# Patient Record
Sex: Male | Born: 1935 | Race: White | Hispanic: No | Smoking: Never smoker
Health system: Southern US, Community
[De-identification: ages and names within clinical notes are randomized; demographics above are authoritative.]

## PROBLEM LIST (undated history)

## (undated) DIAGNOSIS — K5730 Diverticulosis of large intestine without perforation or abscess without bleeding: Secondary | ICD-10-CM

## (undated) DIAGNOSIS — Z8546 Personal history of malignant neoplasm of prostate: Secondary | ICD-10-CM

## (undated) DIAGNOSIS — E039 Hypothyroidism, unspecified: Secondary | ICD-10-CM

## (undated) DIAGNOSIS — N433 Hydrocele, unspecified: Secondary | ICD-10-CM

## (undated) DIAGNOSIS — I4891 Unspecified atrial fibrillation: Secondary | ICD-10-CM

## (undated) DIAGNOSIS — R7309 Other abnormal glucose: Secondary | ICD-10-CM

## (undated) DIAGNOSIS — J301 Allergic rhinitis due to pollen: Secondary | ICD-10-CM

## (undated) DIAGNOSIS — M159 Polyosteoarthritis, unspecified: Secondary | ICD-10-CM

## (undated) DIAGNOSIS — G629 Polyneuropathy, unspecified: Secondary | ICD-10-CM

## (undated) DIAGNOSIS — R634 Abnormal weight loss: Secondary | ICD-10-CM

## (undated) DIAGNOSIS — I5033 Acute on chronic diastolic (congestive) heart failure: Secondary | ICD-10-CM

## (undated) DIAGNOSIS — M1990 Unspecified osteoarthritis, unspecified site: Secondary | ICD-10-CM

## (undated) DIAGNOSIS — I10 Essential (primary) hypertension: Secondary | ICD-10-CM

## (undated) DIAGNOSIS — R269 Unspecified abnormalities of gait and mobility: Secondary | ICD-10-CM

## (undated) DIAGNOSIS — Z86010 Personal history of colonic polyps: Secondary | ICD-10-CM

## (undated) HISTORY — DX: Hydrocele, unspecified: N43.3

## (undated) HISTORY — DX: Diverticulosis of large intestine without perforation or abscess without bleeding: K57.30

## (undated) HISTORY — DX: Polyosteoarthritis, unspecified: M15.9

## (undated) HISTORY — DX: Unspecified abnormalities of gait and mobility: R26.9

## (undated) HISTORY — DX: Polyneuropathy, unspecified: G62.9

## (undated) HISTORY — DX: Acute on chronic diastolic (congestive) heart failure: I50.33

## (undated) HISTORY — DX: Hypothyroidism, unspecified: E03.9

## (undated) HISTORY — PX: OTHER SURGICAL HISTORY: SHX169

## (undated) HISTORY — DX: Unspecified osteoarthritis, unspecified site: M19.90

## (undated) HISTORY — DX: Allergic rhinitis due to pollen: J30.1

## (undated) HISTORY — DX: Essential (primary) hypertension: I10

## (undated) HISTORY — DX: Personal history of malignant neoplasm of prostate: Z85.46

## (undated) HISTORY — DX: Abnormal weight loss: R63.4

## (undated) HISTORY — DX: Other abnormal glucose: R73.09

## (undated) HISTORY — PX: PROSTATE SURGERY: SHX751

## (undated) HISTORY — PX: HYDROCELE EXCISION / REPAIR: SUR1145

## (undated) HISTORY — DX: Personal history of colonic polyps: Z86.010

## (undated) HISTORY — PX: ROTATOR CUFF REPAIR: SHX139

---

## 1998-03-25 DIAGNOSIS — E785 Hyperlipidemia, unspecified: Secondary | ICD-10-CM | POA: Insufficient documentation

## 2000-04-26 LAB — HM COLONOSCOPY

## 2000-05-12 ENCOUNTER — Encounter: Payer: Self-pay | Admitting: Internal Medicine

## 2003-07-27 ENCOUNTER — Ambulatory Visit (HOSPITAL_COMMUNITY): Admission: RE | Admit: 2003-07-27 | Discharge: 2003-07-27 | Payer: Self-pay | Admitting: Neurology

## 2003-11-09 ENCOUNTER — Encounter: Admission: RE | Admit: 2003-11-09 | Discharge: 2003-11-09 | Payer: Self-pay | Admitting: Internal Medicine

## 2003-11-23 ENCOUNTER — Inpatient Hospital Stay (HOSPITAL_BASED_OUTPATIENT_CLINIC_OR_DEPARTMENT_OTHER): Admission: RE | Admit: 2003-11-23 | Discharge: 2003-11-23 | Payer: Self-pay | Admitting: *Deleted

## 2003-11-29 ENCOUNTER — Encounter: Admission: RE | Admit: 2003-11-29 | Discharge: 2003-11-29 | Payer: Self-pay | Admitting: *Deleted

## 2003-12-26 ENCOUNTER — Encounter: Payer: Self-pay | Admitting: Cardiology

## 2003-12-26 ENCOUNTER — Ambulatory Visit (HOSPITAL_COMMUNITY): Admission: RE | Admit: 2003-12-26 | Discharge: 2003-12-26 | Payer: Self-pay | Admitting: Cardiology

## 2004-01-30 ENCOUNTER — Encounter: Payer: Self-pay | Admitting: Internal Medicine

## 2004-05-02 ENCOUNTER — Ambulatory Visit: Payer: Self-pay | Admitting: Internal Medicine

## 2004-10-29 ENCOUNTER — Ambulatory Visit: Payer: Self-pay | Admitting: Internal Medicine

## 2004-11-19 ENCOUNTER — Ambulatory Visit: Payer: Self-pay | Admitting: Internal Medicine

## 2004-11-23 ENCOUNTER — Ambulatory Visit: Payer: Self-pay | Admitting: Cardiology

## 2004-12-05 ENCOUNTER — Ambulatory Visit: Payer: Self-pay | Admitting: Cardiology

## 2004-12-28 ENCOUNTER — Ambulatory Visit: Payer: Self-pay | Admitting: Internal Medicine

## 2005-02-20 ENCOUNTER — Ambulatory Visit: Payer: Self-pay | Admitting: Internal Medicine

## 2005-04-05 ENCOUNTER — Ambulatory Visit: Payer: Self-pay | Admitting: Internal Medicine

## 2005-04-15 ENCOUNTER — Ambulatory Visit: Payer: Self-pay | Admitting: Gastroenterology

## 2005-04-26 ENCOUNTER — Ambulatory Visit: Payer: Self-pay | Admitting: Gastroenterology

## 2005-04-26 ENCOUNTER — Encounter: Payer: Self-pay | Admitting: Internal Medicine

## 2005-07-08 ENCOUNTER — Ambulatory Visit: Payer: Self-pay | Admitting: Internal Medicine

## 2005-10-31 ENCOUNTER — Ambulatory Visit: Payer: Self-pay | Admitting: Internal Medicine

## 2005-11-11 ENCOUNTER — Ambulatory Visit: Payer: Self-pay | Admitting: Internal Medicine

## 2006-01-21 ENCOUNTER — Ambulatory Visit: Payer: Self-pay | Admitting: Internal Medicine

## 2006-10-28 DIAGNOSIS — Z860100 Personal history of colon polyps, unspecified: Secondary | ICD-10-CM

## 2006-10-28 DIAGNOSIS — I10 Essential (primary) hypertension: Secondary | ICD-10-CM | POA: Insufficient documentation

## 2006-10-28 DIAGNOSIS — E785 Hyperlipidemia, unspecified: Secondary | ICD-10-CM

## 2006-10-28 HISTORY — DX: Hyperlipidemia, unspecified: E78.5

## 2006-10-28 HISTORY — DX: Essential (primary) hypertension: I10

## 2006-10-28 HISTORY — DX: Personal history of colonic polyps: Z86.010

## 2006-10-28 HISTORY — DX: Personal history of colon polyps, unspecified: Z86.0100

## 2006-11-18 ENCOUNTER — Ambulatory Visit: Payer: Self-pay | Admitting: Internal Medicine

## 2006-11-18 LAB — CONVERTED CEMR LAB
AST: 26 units/L (ref 0–37)
Albumin: 3.2 g/dL — ABNORMAL LOW (ref 3.5–5.2)
Basophils Absolute: 0 10*3/uL (ref 0.0–0.1)
Bilirubin Urine: NEGATIVE
Bilirubin, Direct: 0.1 mg/dL (ref 0.0–0.3)
Blood in Urine, dipstick: NEGATIVE
Chloride: 107 meq/L (ref 96–112)
Cholesterol: 143 mg/dL (ref 0–200)
Eosinophils Absolute: 0.1 10*3/uL (ref 0.0–0.6)
Eosinophils Relative: 2.1 % (ref 0.0–5.0)
GFR calc Af Amer: 106 mL/min
GFR calc non Af Amer: 88 mL/min
Glucose, Bld: 101 mg/dL — ABNORMAL HIGH (ref 70–99)
Glucose, Urine, Semiquant: NEGATIVE
HCT: 39.5 % (ref 39.0–52.0)
Ketones, urine, test strip: NEGATIVE
Lymphocytes Relative: 41.2 % (ref 12.0–46.0)
MCHC: 33.7 g/dL (ref 30.0–36.0)
MCV: 89.4 fL (ref 78.0–100.0)
Monocytes Absolute: 0.4 10*3/uL (ref 0.2–0.7)
Neutro Abs: 2.3 10*3/uL (ref 1.4–7.7)
Neutrophils Relative %: 48.7 % (ref 43.0–77.0)
Nitrite: NEGATIVE
PSA: 0.96 ng/mL (ref 0.10–4.00)
Potassium: 4 meq/L (ref 3.5–5.1)
Protein, U semiquant: NEGATIVE
RBC: 4.42 M/uL (ref 4.22–5.81)
Sodium: 141 meq/L (ref 135–145)
Specific Gravity, Urine: 1.015
TSH: 2.91 microintl units/mL (ref 0.35–5.50)
Total CHOL/HDL Ratio: 3.5
Urobilinogen, UA: 0.2
WBC Urine, dipstick: NEGATIVE
WBC: 4.7 10*3/uL (ref 4.5–10.5)
pH: 6

## 2006-11-27 ENCOUNTER — Telehealth: Payer: Self-pay | Admitting: *Deleted

## 2006-11-27 ENCOUNTER — Ambulatory Visit: Payer: Self-pay | Admitting: Internal Medicine

## 2006-11-27 DIAGNOSIS — K5730 Diverticulosis of large intestine without perforation or abscess without bleeding: Secondary | ICD-10-CM

## 2006-11-27 DIAGNOSIS — R634 Abnormal weight loss: Secondary | ICD-10-CM

## 2006-11-27 DIAGNOSIS — E039 Hypothyroidism, unspecified: Secondary | ICD-10-CM

## 2006-11-27 DIAGNOSIS — M1990 Unspecified osteoarthritis, unspecified site: Secondary | ICD-10-CM | POA: Insufficient documentation

## 2006-11-27 HISTORY — DX: Hypothyroidism, unspecified: E03.9

## 2006-11-27 HISTORY — DX: Diverticulosis of large intestine without perforation or abscess without bleeding: K57.30

## 2006-11-27 HISTORY — DX: Abnormal weight loss: R63.4

## 2006-11-27 HISTORY — DX: Unspecified osteoarthritis, unspecified site: M19.90

## 2006-12-30 ENCOUNTER — Encounter: Payer: Self-pay | Admitting: Internal Medicine

## 2007-01-28 ENCOUNTER — Ambulatory Visit: Payer: Self-pay | Admitting: Internal Medicine

## 2007-02-25 ENCOUNTER — Ambulatory Visit: Payer: Self-pay | Admitting: Internal Medicine

## 2007-02-27 ENCOUNTER — Encounter (INDEPENDENT_AMBULATORY_CARE_PROVIDER_SITE_OTHER): Payer: Self-pay | Admitting: Urology

## 2007-02-27 ENCOUNTER — Ambulatory Visit (HOSPITAL_COMMUNITY): Admission: RE | Admit: 2007-02-27 | Discharge: 2007-02-28 | Payer: Self-pay | Admitting: Urology

## 2007-04-03 ENCOUNTER — Ambulatory Visit: Admission: RE | Admit: 2007-04-03 | Discharge: 2007-06-25 | Payer: Self-pay | Admitting: Radiation Oncology

## 2007-04-06 ENCOUNTER — Encounter: Payer: Self-pay | Admitting: Internal Medicine

## 2007-04-06 ENCOUNTER — Telehealth (INDEPENDENT_AMBULATORY_CARE_PROVIDER_SITE_OTHER): Payer: Self-pay | Admitting: *Deleted

## 2007-05-28 ENCOUNTER — Ambulatory Visit: Payer: Self-pay | Admitting: Internal Medicine

## 2007-05-28 ENCOUNTER — Telehealth: Payer: Self-pay | Admitting: Internal Medicine

## 2007-05-28 DIAGNOSIS — Z8546 Personal history of malignant neoplasm of prostate: Secondary | ICD-10-CM | POA: Insufficient documentation

## 2007-05-28 HISTORY — DX: Personal history of malignant neoplasm of prostate: Z85.46

## 2007-05-28 LAB — CONVERTED CEMR LAB
Cholesterol, target level: 200 mg/dL
HDL goal, serum: 40 mg/dL

## 2007-06-25 ENCOUNTER — Ambulatory Visit: Admission: RE | Admit: 2007-06-25 | Discharge: 2007-08-09 | Payer: Self-pay | Admitting: Radiation Oncology

## 2007-07-15 ENCOUNTER — Encounter: Payer: Self-pay | Admitting: Internal Medicine

## 2007-07-20 ENCOUNTER — Encounter: Payer: Self-pay | Admitting: Internal Medicine

## 2007-08-05 ENCOUNTER — Telehealth: Payer: Self-pay | Admitting: *Deleted

## 2007-09-08 ENCOUNTER — Encounter: Payer: Self-pay | Admitting: Internal Medicine

## 2007-09-08 ENCOUNTER — Telehealth: Payer: Self-pay | Admitting: *Deleted

## 2007-10-08 ENCOUNTER — Ambulatory Visit: Payer: Self-pay | Admitting: Internal Medicine

## 2007-10-08 LAB — CONVERTED CEMR LAB
Basophils Relative: 0.8 % (ref 0.0–3.0)
Eosinophils Relative: 1.6 % (ref 0.0–5.0)
Monocytes Relative: 9 % (ref 3.0–12.0)
Neutrophils Relative %: 60.3 % (ref 43.0–77.0)
Platelets: 161 10*3/uL (ref 150–400)
RBC: 4.18 M/uL — ABNORMAL LOW (ref 4.22–5.81)
WBC: 3.9 10*3/uL — ABNORMAL LOW (ref 4.5–10.5)

## 2007-10-15 ENCOUNTER — Ambulatory Visit: Payer: Self-pay | Admitting: Internal Medicine

## 2007-10-27 ENCOUNTER — Encounter: Payer: Self-pay | Admitting: Internal Medicine

## 2007-12-02 ENCOUNTER — Ambulatory Visit: Payer: Self-pay | Admitting: Internal Medicine

## 2007-12-02 LAB — CONVERTED CEMR LAB
AST: 28 units/L (ref 0–37)
Basophils Absolute: 0 10*3/uL (ref 0.0–0.1)
Basophils Relative: 0.5 % (ref 0.0–3.0)
Bilirubin Urine: NEGATIVE
Blood in Urine, dipstick: NEGATIVE
Chloride: 112 meq/L (ref 96–112)
Cholesterol: 142 mg/dL (ref 0–200)
Creatinine, Ser: 1 mg/dL (ref 0.4–1.5)
Eosinophils Absolute: 0.1 10*3/uL (ref 0.0–0.7)
Ferritin: 17.1 ng/mL — ABNORMAL LOW (ref 22.0–322.0)
GFR calc non Af Amer: 78 mL/min
Glucose, Urine, Semiquant: NEGATIVE
HDL: 34.2 mg/dL — ABNORMAL LOW (ref >39.0)
Ketones, urine, test strip: NEGATIVE
MCHC: 34.4 g/dL (ref 30.0–36.0)
MCV: 91.7 fL (ref 78.0–100.0)
Neutrophils Relative %: 55.6 % (ref 43.0–77.0)
Platelets: 159 10*3/uL (ref 150–400)
Protein, U semiquant: NEGATIVE
RBC: 4.26 M/uL (ref 4.22–5.81)
RDW: 14.8 % — ABNORMAL HIGH (ref 11.5–14.6)
Sodium: 141 meq/L (ref 135–145)
TSH: 1.84 microintl units/mL (ref 0.35–5.50)
Total Bilirubin: 0.8 mg/dL (ref 0.3–1.2)
Triglycerides: 44 mg/dL (ref 0–149)
Urobilinogen, UA: 0.2
VLDL: 9 mg/dL (ref 0–40)
Vitamin B-12: 525 pg/mL (ref 211–911)
pH: 6

## 2007-12-10 ENCOUNTER — Ambulatory Visit: Payer: Self-pay | Admitting: Internal Medicine

## 2007-12-17 ENCOUNTER — Telehealth: Payer: Self-pay | Admitting: Internal Medicine

## 2008-01-20 ENCOUNTER — Encounter: Payer: Self-pay | Admitting: Internal Medicine

## 2008-04-05 ENCOUNTER — Ambulatory Visit: Payer: Self-pay | Admitting: Internal Medicine

## 2008-04-05 ENCOUNTER — Telehealth: Payer: Self-pay | Admitting: Internal Medicine

## 2008-04-05 DIAGNOSIS — K1120 Sialoadenitis, unspecified: Secondary | ICD-10-CM | POA: Insufficient documentation

## 2008-04-13 ENCOUNTER — Telehealth: Payer: Self-pay | Admitting: Internal Medicine

## 2008-06-07 ENCOUNTER — Ambulatory Visit: Payer: Self-pay | Admitting: Internal Medicine

## 2008-06-07 DIAGNOSIS — J301 Allergic rhinitis due to pollen: Secondary | ICD-10-CM | POA: Insufficient documentation

## 2008-06-07 HISTORY — DX: Allergic rhinitis due to pollen: J30.1

## 2008-06-07 LAB — CONVERTED CEMR LAB
Calcium: 9.4 mg/dL (ref 8.4–10.5)
Free T4: 1.1 ng/dL (ref 0.6–1.6)
GFR calc non Af Amer: 77.48 mL/min (ref >60)
Sodium: 142 meq/L (ref 135–145)
T3, Free: 2.9 pg/mL (ref 2.3–4.2)

## 2008-08-24 ENCOUNTER — Ambulatory Visit: Payer: Self-pay | Admitting: Internal Medicine

## 2008-08-24 DIAGNOSIS — J320 Chronic maxillary sinusitis: Secondary | ICD-10-CM | POA: Insufficient documentation

## 2008-08-24 HISTORY — DX: Chronic maxillary sinusitis: J32.0

## 2008-09-08 ENCOUNTER — Encounter: Payer: Self-pay | Admitting: Internal Medicine

## 2008-10-18 ENCOUNTER — Ambulatory Visit: Payer: Self-pay | Admitting: Internal Medicine

## 2008-10-18 DIAGNOSIS — R7309 Other abnormal glucose: Secondary | ICD-10-CM

## 2008-10-18 DIAGNOSIS — R498 Other voice and resonance disorders: Secondary | ICD-10-CM | POA: Insufficient documentation

## 2008-10-18 HISTORY — DX: Other abnormal glucose: R73.09

## 2008-10-18 HISTORY — DX: Other voice and resonance disorders: R49.8

## 2008-10-18 LAB — CONVERTED CEMR LAB
Calcium: 9.2 mg/dL (ref 8.4–10.5)
Creatinine, Ser: 0.9 mg/dL (ref 0.4–1.5)
Hgb A1c MFr Bld: 6 % (ref 4.6–6.5)

## 2008-11-22 ENCOUNTER — Ambulatory Visit: Payer: Self-pay | Admitting: Internal Medicine

## 2008-11-22 LAB — CONVERTED CEMR LAB: TSH: 1.05 microintl units/mL (ref 0.35–5.50)

## 2008-11-24 ENCOUNTER — Encounter: Payer: Self-pay | Admitting: Internal Medicine

## 2009-02-08 ENCOUNTER — Ambulatory Visit: Payer: Self-pay | Admitting: Internal Medicine

## 2009-02-08 LAB — CONVERTED CEMR LAB
Albumin: 3.5 g/dL (ref 3.5–5.2)
Alkaline Phosphatase: 45 units/L (ref 39–117)
BUN: 11 mg/dL (ref 6–23)
Bilirubin, Direct: 0.1 mg/dL (ref 0.0–0.3)
CO2: 30 meq/L (ref 19–32)
Chloride: 108 meq/L (ref 96–112)
Creatinine, Ser: 1 mg/dL (ref 0.4–1.5)
LDL Cholesterol: 97 mg/dL (ref 0–99)
Total CHOL/HDL Ratio: 4

## 2009-02-21 ENCOUNTER — Ambulatory Visit: Payer: Self-pay | Admitting: Internal Medicine

## 2009-03-06 ENCOUNTER — Telehealth: Payer: Self-pay | Admitting: Internal Medicine

## 2009-04-19 ENCOUNTER — Telehealth: Payer: Self-pay | Admitting: Internal Medicine

## 2009-06-06 ENCOUNTER — Ambulatory Visit: Payer: Self-pay | Admitting: Internal Medicine

## 2009-06-06 DIAGNOSIS — B351 Tinea unguium: Secondary | ICD-10-CM

## 2009-06-06 HISTORY — DX: Tinea unguium: B35.1

## 2009-06-06 LAB — CONVERTED CEMR LAB
ALT: 21 units/L (ref 0–53)
Albumin: 3.6 g/dL (ref 3.5–5.2)
BUN: 12 mg/dL (ref 6–23)
Basophils Relative: 0.6 % (ref 0.0–3.0)
CO2: 30 meq/L (ref 19–32)
Chloride: 105 meq/L (ref 96–112)
Eosinophils Relative: 1.4 % (ref 0.0–5.0)
Glucose, Bld: 106 mg/dL — ABNORMAL HIGH (ref 70–99)
HDL: 40.7 mg/dL (ref >39.00)
Lymphocytes Relative: 29.6 % (ref 12.0–46.0)
MCV: 96 fL (ref 78.0–100.0)
Monocytes Absolute: 0.5 10*3/uL (ref 0.1–1.0)
Monocytes Relative: 9 % (ref 3.0–12.0)
Neutrophils Relative %: 59.4 % (ref 43.0–77.0)
Potassium: 3.9 meq/L (ref 3.5–5.1)
RBC: 4.71 M/uL (ref 4.22–5.81)
Sodium: 140 meq/L (ref 135–145)
Total Bilirubin: 1 mg/dL (ref 0.3–1.2)
Triglycerides: 73 mg/dL (ref 0.0–149.0)
VLDL: 14.6 mg/dL (ref 0.0–40.0)
WBC: 5.3 10*3/uL (ref 4.5–10.5)

## 2009-09-07 ENCOUNTER — Ambulatory Visit: Payer: Self-pay | Admitting: Internal Medicine

## 2009-12-26 ENCOUNTER — Ambulatory Visit: Payer: Self-pay | Admitting: Internal Medicine

## 2009-12-26 LAB — CONVERTED CEMR LAB
Albumin: 3.8 g/dL (ref 3.5–5.2)
Cholesterol: 148 mg/dL (ref 0–200)
HDL: 38.3 mg/dL — ABNORMAL LOW (ref >39.00)
LDL Cholesterol: 98 mg/dL (ref 0–99)
Total CHOL/HDL Ratio: 4
Total Protein: 6.4 g/dL (ref 6.0–8.3)
Triglycerides: 60 mg/dL (ref 0.0–149.0)

## 2010-01-03 ENCOUNTER — Ambulatory Visit: Payer: Self-pay | Admitting: Internal Medicine

## 2010-01-03 DIAGNOSIS — M159 Polyosteoarthritis, unspecified: Secondary | ICD-10-CM | POA: Insufficient documentation

## 2010-01-03 HISTORY — DX: Polyosteoarthritis, unspecified: M15.9

## 2010-01-17 ENCOUNTER — Ambulatory Visit: Payer: Self-pay | Admitting: Family Medicine

## 2010-01-17 DIAGNOSIS — T07XXXA Unspecified multiple injuries, initial encounter: Secondary | ICD-10-CM | POA: Insufficient documentation

## 2010-04-14 ENCOUNTER — Encounter: Payer: Self-pay | Admitting: Surgery

## 2010-04-15 ENCOUNTER — Encounter: Payer: Self-pay | Admitting: *Deleted

## 2010-04-18 ENCOUNTER — Encounter: Payer: Self-pay | Admitting: Gastroenterology

## 2010-04-21 ENCOUNTER — Encounter: Payer: Self-pay | Admitting: *Deleted

## 2010-04-22 LAB — CONVERTED CEMR LAB
BUN: 20 mg/dL (ref 6–23)
Basophils Absolute: 0 10*3/uL (ref 0.0–0.1)
Basophils Relative: 0 % (ref 0.0–1.0)
CO2: 30 meq/L (ref 19–32)
Chloride: 105 meq/L (ref 96–112)
Creatinine, Ser: 1 mg/dL (ref 0.4–1.5)
HCT: 39.4 % (ref 39.0–52.0)
Hemoglobin: 13.4 g/dL (ref 13.0–17.0)
MCHC: 34 g/dL (ref 30.0–36.0)
Monocytes Absolute: 0.5 10*3/uL (ref 0.2–0.7)
Neutrophils Relative %: 49.4 % (ref 43.0–77.0)
RBC: 4.28 M/uL (ref 4.22–5.81)
RDW: 13.3 % (ref 11.5–14.6)
Sodium: 140 meq/L (ref 135–145)
TSH: 1.89 microintl units/mL (ref 0.35–5.50)
WBC: 4.9 10*3/uL (ref 4.5–10.5)

## 2010-04-24 NOTE — Assessment & Plan Note (Signed)
Summary: 3 month rov/njr   Vital Signs:  Patient profile:   77 year old male Height:      75 inches Weight:      193 pounds BMI:     24.21 Temp:     98.2 degrees F oral Pulse rate:   48 / minute Resp:     14 per minute BP sitting:   130 / 80  (left arm)  Vitals Entered By: Willy Eddy, LPN (September 07, 2009 9:33 AM) CC: roa-has taken lamisil for 90 days, Hypertension Management, Lipid Management   Primary Care Provider:  Stacie Glaze MD  CC:  roa-has taken lamisil for 90 days, Hypertension Management, and Lipid Management.  History of Present Illness: the pt had seeds and xrt for prostate cnacer and psa has not increased the pt has screening labs for cbc bmet and psa last ov which we reviewed and set expectation blood pressure  and pulse is stable and the pulse is slow ( bradycardic)  Hypertension History:      He denies headache, chest pain, palpitations, dyspnea with exertion, orthopnea, PND, peripheral edema, visual symptoms, neurologic problems, syncope, and side effects from treatment.  stable.        Positive major cardiovascular risk factors include male age 78 years old or older, hyperlipidemia, and hypertension.  Negative major cardiovascular risk factors include negative family history for ischemic heart disease and non-tobacco-user status.    Lipid Management History:      Positive NCEP/ATP III risk factors include male age 20 years old or older and hypertension.  Negative NCEP/ATP III risk factors include no family history for ischemic heart disease and non-tobacco-user status.      Preventive Screening-Counseling & Management  Alcohol-Tobacco     Smoking Status: never     Passive Smoke Exposure: no  Problems Prior to Update: 1)  Onychomycosis  (ICD-110.1) 2)  Hoarseness, Chronic  (ICD-784.49) 3)  Hyperglycemia  (ICD-790.29) 4)  Chronic Maxillary Sinusitis  (ICD-473.0) 5)  Allergic Rhinitis Due To Pollen  (ICD-477.0) 6)  Sialadenitis, Right   (ICD-527.2) 7)  Preoperative Examination  (ICD-V72.84) 8)  Weight Loss, Recent  (ICD-783.21) 9)  Preventive Health Care  (ICD-V70.0) 10)  Neoplasm, Malignant, Prostate, Hx Of, S/p Turp  (ICD-V10.46) 11)  Osteoarthritis  (ICD-715.90) 12)  Hypothyroidism  (ICD-244.9) 13)  Diverticulosis, Colon  (ICD-562.10) 14)  Hypertension  (ICD-401.9) 15)  Hyperlipidemia  (ICD-272.4) 16)  Colonic Polyps, Hx of  (ICD-V12.72)  Current Problems (verified): 1)  Onychomycosis  (ICD-110.1) 2)  Hoarseness, Chronic  (ICD-784.49) 3)  Hyperglycemia  (ICD-790.29) 4)  Chronic Maxillary Sinusitis  (ICD-473.0) 5)  Allergic Rhinitis Due To Pollen  (ICD-477.0) 6)  Sialadenitis, Right  (ICD-527.2) 7)  Preoperative Examination  (ICD-V72.84) 8)  Weight Loss, Recent  (ICD-783.21) 9)  Preventive Health Care  (ICD-V70.0) 10)  Neoplasm, Malignant, Prostate, Hx Of, S/p Turp  (ICD-V10.46) 11)  Osteoarthritis  (ICD-715.90) 12)  Hypothyroidism  (ICD-244.9) 13)  Diverticulosis, Colon  (ICD-562.10) 14)  Hypertension  (ICD-401.9) 15)  Hyperlipidemia  (ICD-272.4) 16)  Colonic Polyps, Hx of  (ICD-V12.72)  Medications Prior to Update: 1)  Prinivil 20 Mg  Tabs (Lisinopril) .... Take 1 Tablet By Mouth Once A Day 2)  Crestor 10 Mg Tabs (Rosuvastatin Calcium) .Marland Kitchen.. 1 Once Daily 3)  Synthroid 50 Mcg  Tabs (Levothyroxine Sodium) .... Take 1 Tablet By Mouth Once A Day 4)  Bl Vitamin B-6 100 Mg  Tabs (Pyridoxine Hcl) .... Once Daily 5)  Amlodipine Besylate  5 Mg  Tabs (Amlodipine Besylate) .... Once Daily 6)  Saw Palmetto 160 Mg  Caps (Saw Palmetto (Serenoa Repens)) .... Once Daily 7)  Travatan 0.004 %  Soln (Travoprost) .... As Directed 8)  Multivitamins   Caps (Multiple Vitamin) .Marland Kitchen.. 1 Once Daily 9)  Zantac 150 Mg  Caps (Ranitidine Hcl) .Marland Kitchen.. 1 Once Daily 10)  Adult Aspirin Ec Low Strength 81 Mg Tbec (Aspirin) .Marland Kitchen.. 1 Once Daily 11)  Glucosamine-Chondroitin-Msm 500-250-250 Mg Caps (Glucosamine-Chondroitin-Msm) .... 2 Once Daily 12)   Timoptic 0.5 % Soln (Timolol Maleate) .Marland Kitchen.. 1 Drop in Both Eyes Two Times A Day 13)  Fish Oil Concentrate 1000 Mg Caps (Omega-3 Fatty Acids) .... Two  Two Times A Day 14)  Ipratropium Bromide 0.03 % Soln (Ipratropium Bromide) .... Two Sprays in Each Nostril Two Times A Day  Current Medications (verified): 1)  Prinivil 20 Mg  Tabs (Lisinopril) .... Take 1 Tablet By Mouth Once A Day 2)  Crestor 10 Mg Tabs (Rosuvastatin Calcium) .Marland Kitchen.. 1 Once Daily 3)  Synthroid 50 Mcg  Tabs (Levothyroxine Sodium) .... Take 1 Tablet By Mouth Once A Day 4)  Bl Vitamin B-6 100 Mg  Tabs (Pyridoxine Hcl) .... Once Daily 5)  Amlodipine Besylate 5 Mg  Tabs (Amlodipine Besylate) .... Once Daily 6)  Saw Palmetto 160 Mg  Caps (Saw Palmetto (Serenoa Repens)) .... Once Daily 7)  Travatan 0.004 %  Soln (Travoprost) .... As Directed 8)  Multivitamins   Caps (Multiple Vitamin) .Marland Kitchen.. 1 Once Daily 9)  Zantac 150 Mg  Caps (Ranitidine Hcl) .Marland Kitchen.. 1 Once Daily 10)  Adult Aspirin Ec Low Strength 81 Mg Tbec (Aspirin) .Marland Kitchen.. 1 Once Daily 11)  Glucosamine-Chondroitin-Msm 500-250-250 Mg Caps (Glucosamine-Chondroitin-Msm) .... 2 Two Times A Day 12)  Timoptic 0.5 % Soln (Timolol Maleate) .Marland Kitchen.. 1 Drop in Both Eyes Two Times A Day 13)  Fish Oil Concentrate 1000 Mg Caps (Omega-3 Fatty Acids) .... Two  Two Times A Day 14)  Ipratropium Bromide 0.03 % Soln (Ipratropium Bromide) .... Two Sprays in Each Nostril Two Times A Day  Allergies (verified): No Known Drug Allergies  Past History:  Family History: Last updated: 03-27-2007 mother died at 74  CAD  ( advanced age) father  died at 42 with pancreatic CA  Social History: Last updated: 11/27/2006 Retired Married Never Smoked Alcohol use-no Drug use-no Regular exercise-yes  Risk Factors: Caffeine Use: 0 (2007-03-27) Exercise: yes (11/27/2006)  Risk Factors: Smoking Status: never (09/07/2009) Passive Smoke Exposure: no (09/07/2009)  Past medical, surgical, family and social histories  (including risk factors) reviewed, and no changes noted (except as noted below).  Past Medical History: Reviewed history from 11/27/2006 and no changes required. Colonic polyps, hx of Hyperlipidemia Hypertension BET Diverticulosis, colon Hypothyroidism Osteoarthritis Benign prostatic hypertrophy  Past Surgical History: Reviewed history from 11/27/2006 and no changes required. Colonoscopy-1998 Rotator cuff repair arthroscopy to right knee hydrocele  Family History: Reviewed history from 2007-03-27 and no changes required. mother died at 24  CAD  ( advanced age) father  died at 41 with pancreatic CA  Social History: Reviewed history from 11/27/2006 and no changes required. Retired Married Never Smoked Alcohol use-no Drug use-no Regular exercise-yes  Review of Systems  The patient denies anorexia, fever, weight loss, weight gain, vision loss, decreased hearing, hoarseness, chest pain, syncope, dyspnea on exertion, peripheral edema, prolonged cough, headaches, hemoptysis, abdominal pain, melena, hematochezia, severe indigestion/heartburn, hematuria, incontinence, genital sores, muscle weakness, suspicious skin lesions, transient blindness, difficulty walking, depression, unusual weight change, abnormal bleeding,  enlarged lymph nodes, angioedema, breast masses, and testicular masses.    Physical Exam  General:  Well-developed,well-nourished,in no acute distress; alert,appropriate and cooperative throughout examination Head:  normocephalic, atraumatic, and male-pattern balding.   Eyes:  pupils equal.   Ears:  R ear normal and L ear normal.   Nose:  no external deformity and no nasal discharge.   Mouth:  good dentition and pharynx pink and moist.   Neck:  No deformities, masses, or tenderness noted. Lungs:  Normal respiratory effort, chest expands symmetrically. Lungs are clear to auscultation, no crackles or wheezes. Heart:  Normal rate and regular rhythm. S1 and S2 normal  without gallop, murmur, click, rub or other extra sounds. Abdomen:  Bowel sounds positive,abdomen soft and non-tender without masses, organomegaly or hernias noted. Msk:  normal ROM and no joint tenderness.   Extremities:  thicken nails with trimming and care given today I have spent greater that 30 min face to face evaluating this patient  Neurologic:  alert & oriented X3 and cranial nerves II-XII intact.     Complete Medication List: 1)  Prinivil 20 Mg Tabs (Lisinopril) .... Take 1 tablet by mouth once a day 2)  Crestor 10 Mg Tabs (Rosuvastatin calcium) .Marland Kitchen.. 1 once daily 3)  Synthroid 50 Mcg Tabs (Levothyroxine sodium) .... Take 1 tablet by mouth once a day 4)  Bl Vitamin B-6 100 Mg Tabs (Pyridoxine hcl) .... Once daily 5)  Amlodipine Besylate 5 Mg Tabs (Amlodipine besylate) .... Once daily 6)  Saw Palmetto 160 Mg Caps (Saw palmetto (serenoa repens)) .... Once daily 7)  Travatan 0.004 % Soln (Travoprost) .... As directed 8)  Multivitamins Caps (Multiple vitamin) .Marland Kitchen.. 1 once daily 9)  Zantac 150 Mg Caps (Ranitidine hcl) .Marland Kitchen.. 1 once daily 10)  Adult Aspirin Ec Low Strength 81 Mg Tbec (Aspirin) .Marland Kitchen.. 1 once daily 11)  Glucosamine-chondroitin-msm 500-250-250 Mg Caps (Glucosamine-chondroitin-msm) .... 2 two times a day 12)  Timoptic 0.5 % Soln (Timolol maleate) .Marland Kitchen.. 1 drop in both eyes two times a day 13)  Fish Oil Concentrate 1000 Mg Caps (Omega-3 fatty acids) .... Two  two times a day 14)  Ipratropium Bromide 0.03 % Soln (Ipratropium bromide) .... Two sprays in each nostril two times a day  Hypertension Assessment/Plan:      The patient's hypertensive risk group is category B: At least one risk factor (excluding diabetes) with no target organ damage.  His calculated 10 year risk of coronary heart disease is 14 %.  Today's blood pressure is 130/80.  His blood pressure goal is < 140/90.  Lipid Assessment/Plan:      Based on NCEP/ATP III, the patient's risk factor category is "2 or more risk  factors and a calculated 10 year CAD risk of < 20%".  The patient's lipid goals are as follows: Total cholesterol goal is 200; LDL cholesterol goal is 130; HDL cholesterol goal is 40; Triglyceride goal is 150.  His LDL cholesterol goal has been met.    Patient Instructions: 1)  Please schedule a follow-up appointment in 4 months. 2)  Hepatic Panel prior to visit, ICD-9:995.20 3)  Lipid Panel prior to visit, ICD-9:272.4

## 2010-04-24 NOTE — Assessment & Plan Note (Signed)
Summary: 4 MONTH ROV/NJR   Vital Signs:  Patient profile:   77 year old male Height:      75 inches Weight:      195 pounds BMI:     24.46 Temp:     98.2 degrees F oral Pulse rate:   48 / minute Resp:     14 per minute BP sitting:   124 / 78  (left arm)  Vitals Entered By: Willy Eddy, LPN (January 03, 2010 9:18 AM) CC: roa labs, Lipid Management Is Patient Diabetic? No   Primary Care Provider:  Stacie Glaze MD  CC:  roa labs and Lipid Management.  History of Present Illness:  on crestor one daily with good stable results and normal LFT blood presure is well controlled pt has acute/chronic complaints of "lack of energy"  and OA in shoulder and knee Hx of rotator cuff repair 15 years ago He has "dull joint" pain has been using artharest He has a hx of arthroscopy in the right knee ( [place])    Lipid Management History:      Positive NCEP/ATP III risk factors include male age 32 years old or older, HDL cholesterol less than 40, and hypertension.  Negative NCEP/ATP III risk factors include no family history for ischemic heart disease, non-tobacco-user status, no ASHD (atherosclerotic heart disease), no prior stroke/TIA, and no history of aortic aneurysm.     Preventive Screening-Counseling & Management  Alcohol-Tobacco     Smoking Status: never     Passive Smoke Exposure: no     Tobacco Counseling: not indicated; no tobacco use  Problems Prior to Update: 1)  Onychomycosis  (ICD-110.1) 2)  Hoarseness, Chronic  (ICD-784.49) 3)  Hyperglycemia  (ICD-790.29) 4)  Chronic Maxillary Sinusitis  (ICD-473.0) 5)  Allergic Rhinitis Due To Pollen  (ICD-477.0) 6)  Sialadenitis, Right  (ICD-527.2) 7)  Preoperative Examination  (ICD-V72.84) 8)  Weight Loss, Recent  (ICD-783.21) 9)  Preventive Health Care  (ICD-V70.0) 10)  Neoplasm, Malignant, Prostate, Hx Of, S/p Turp  (ICD-V10.46) 11)  Osteoarthritis  (ICD-715.90) 12)  Hypothyroidism  (ICD-244.9) 13)   Diverticulosis, Colon  (ICD-562.10) 14)  Hypertension  (ICD-401.9) 15)  Hyperlipidemia  (ICD-272.4) 16)  Colonic Polyps, Hx of  (ICD-V12.72)  Current Problems (verified): 1)  Onychomycosis  (ICD-110.1) 2)  Hoarseness, Chronic  (ICD-784.49) 3)  Hyperglycemia  (ICD-790.29) 4)  Chronic Maxillary Sinusitis  (ICD-473.0) 5)  Allergic Rhinitis Due To Pollen  (ICD-477.0) 6)  Sialadenitis, Right  (ICD-527.2) 7)  Preoperative Examination  (ICD-V72.84) 8)  Weight Loss, Recent  (ICD-783.21) 9)  Preventive Health Care  (ICD-V70.0) 10)  Neoplasm, Malignant, Prostate, Hx Of, S/p Turp  (ICD-V10.46) 11)  Osteoarthritis  (ICD-715.90) 12)  Hypothyroidism  (ICD-244.9) 13)  Diverticulosis, Colon  (ICD-562.10) 14)  Hypertension  (ICD-401.9) 15)  Hyperlipidemia  (ICD-272.4) 16)  Colonic Polyps, Hx of  (ICD-V12.72)  Medications Prior to Update: 1)  Prinivil 20 Mg  Tabs (Lisinopril) .... Take 1 Tablet By Mouth Once A Day 2)  Crestor 10 Mg Tabs (Rosuvastatin Calcium) .Marland Kitchen.. 1 Once Daily 3)  Synthroid 50 Mcg  Tabs (Levothyroxine Sodium) .... Take 1 Tablet By Mouth Once A Day 4)  Bl Vitamin B-6 100 Mg  Tabs (Pyridoxine Hcl) .... Once Daily 5)  Amlodipine Besylate 5 Mg  Tabs (Amlodipine Besylate) .... Once Daily 6)  Saw Palmetto 160 Mg  Caps (Saw Palmetto (Serenoa Repens)) .... Once Daily 7)  Travatan 0.004 %  Soln (Travoprost) .... As Directed 8)  Multivitamins  Caps (Multiple Vitamin) .Marland Kitchen.. 1 Once Daily 9)  Zantac 150 Mg  Caps (Ranitidine Hcl) .Marland Kitchen.. 1 Once Daily 10)  Adult Aspirin Ec Low Strength 81 Mg Tbec (Aspirin) .Marland Kitchen.. 1 Once Daily 11)  Glucosamine-Chondroitin-Msm 500-250-250 Mg Caps (Glucosamine-Chondroitin-Msm) .... 2 Two Times A Day 12)  Timoptic 0.5 % Soln (Timolol Maleate) .Marland Kitchen.. 1 Drop in Both Eyes Two Times A Day 13)  Fish Oil Concentrate 1000 Mg Caps (Omega-3 Fatty Acids) .... Two  Two Times A Day 14)  Ipratropium Bromide 0.03 % Soln (Ipratropium Bromide) .... Two Sprays in Each Nostril Two Times A  Day  Current Medications (verified): 1)  Prinivil 20 Mg  Tabs (Lisinopril) .... Take 1 Tablet By Mouth Once A Day 2)  Crestor 10 Mg Tabs (Rosuvastatin Calcium) .Marland Kitchen.. 1 Once Daily 3)  Synthroid 50 Mcg  Tabs (Levothyroxine Sodium) .... Take 1 Tablet By Mouth Once A Day 4)  Bl Vitamin B-6 100 Mg  Tabs (Pyridoxine Hcl) .... Once Daily 5)  Amlodipine Besylate 5 Mg  Tabs (Amlodipine Besylate) .... Once Daily 6)  Saw Palmetto 160 Mg  Caps (Saw Palmetto (Serenoa Repens)) .... Once Daily 7)  Travatan 0.004 %  Soln (Travoprost) .... As Directed 8)  Multivitamins   Caps (Multiple Vitamin) .Marland Kitchen.. 1 Once Daily 9)  Zantac 150 Mg  Caps (Ranitidine Hcl) .Marland Kitchen.. 1 Once Daily 10)  Adult Aspirin Ec Low Strength 81 Mg Tbec (Aspirin) .Marland Kitchen.. 1 Once Daily 11)  Glucosamine-Chondroitin-Msm 500-250-250 Mg Caps (Glucosamine-Chondroitin-Msm) .... 2 Two Times A Day 12)  Timoptic 0.5 % Soln (Timolol Maleate) .Marland Kitchen.. 1 Drop in Both Eyes Two Times A Day 13)  Fish Oil Concentrate 1000 Mg Caps (Omega-3 Fatty Acids) .... Two  Two Times A Day  Allergies (verified): No Known Drug Allergies  Past History:  Family History: Last updated: 05-Mar-2007 mother died at 36  CAD  ( advanced age) father  died at 68 with pancreatic CA  Social History: Last updated: 11/27/2006 Retired Married Never Smoked Alcohol use-no Drug use-no Regular exercise-yes  Risk Factors: Caffeine Use: 0 (Mar 05, 2007) Exercise: yes (11/27/2006)  Risk Factors: Smoking Status: never (01/03/2010) Passive Smoke Exposure: no (01/03/2010)  Past medical, surgical, family and social histories (including risk factors) reviewed, and no changes noted (except as noted below).  Past Medical History: Reviewed history from 11/27/2006 and no changes required. Colonic polyps, hx of Hyperlipidemia Hypertension BET Diverticulosis, colon Hypothyroidism Osteoarthritis Benign prostatic hypertrophy  Past Surgical History: Reviewed history from 11/27/2006 and no  changes required. Colonoscopy-1998 Rotator cuff repair arthroscopy to right knee hydrocele  Family History: Reviewed history from 03-05-07 and no changes required. mother died at 30  CAD  ( advanced age) father  died at 29 with pancreatic CA  Social History: Reviewed history from 11/27/2006 and no changes required. Retired Married Never Smoked Alcohol use-no Drug use-no Regular exercise-yes  Review of Systems  The patient denies anorexia, fever, weight loss, weight gain, vision loss, decreased hearing, hoarseness, chest pain, syncope, dyspnea on exertion, peripheral edema, prolonged cough, headaches, hemoptysis, abdominal pain, melena, hematochezia, severe indigestion/heartburn, hematuria, incontinence, genital sores, muscle weakness, suspicious skin lesions, transient blindness, difficulty walking, depression, unusual weight change, abnormal bleeding, enlarged lymph nodes, angioedema, and breast masses.         Flu Vaccine Consent Questions     Do you have a history of severe allergic reactions to this vaccine? no    Any prior history of allergic reactions to egg and/or gelatin? no    Do you have a  sensitivity to the preservative Thimersol? no    Do you have a past history of Guillan-Barre Syndrome? no    Do you currently have an acute febrile illness? no    Have you ever had a severe reaction to latex? no    Vaccine information given and explained to patient? yes    Are you currently pregnant? no    Lot Number:[id number]   Exp Date:09/22/2010   Site Given  Left Deltoid IM   Physical Exam  General:  Well-developed,well-nourished,in no acute distress; alert,appropriate and cooperative throughout examination Head:  normocephalic, atraumatic, and male-pattern balding.   Eyes:  pupils equal.   Ears:  R ear normal and L ear normal.   Neck:  No deformities, masses, or tenderness noted. Lungs:  Normal respiratory effort, chest expands symmetrically. Lungs are clear to  auscultation, no crackles or wheezes. Heart:  Normal rate and regular rhythm. S1 and S2 normal without gallop, murmur, click, rub or other extra sounds. Abdomen:  Bowel sounds positive,abdomen soft and non-tender without masses, organomegaly or hernias noted. Msk:  joint tenderness, joint swelling, and joint warmth.   Extremities:  trace left pedal edema and trace right pedal edema.   Neurologic:  alert & oriented X3 and cranial nerves II-XII intact.     Impression & Recommendations:  Problem # 1:  HYPERTENSION (ICD-401.9)  His updated medication list for this problem includes:    Prinivil 20 Mg Tabs (Lisinopril) .Marland Kitchen... Take 1 tablet by mouth once a day    Amlodipine Besylate 5 Mg Tabs (Amlodipine besylate) ..... Once daily  BP today: 124/78 Prior BP: 130/80 (09/07/2009)  10 Yr Risk Heart Disease: 11 % Prior 10 Yr Risk Heart Disease: 14 % (09/07/2009)  Labs Reviewed: K+: 3.9 (06/06/2009) Creat: : 0.9 (06/06/2009)   Chol: 148 (12/26/2009)   HDL: 38.30 (12/26/2009)   LDL: 98 (12/26/2009)   TG: 60.0 (12/26/2009)  Problem # 2:  HYPERLIPIDEMIA (ICD-272.4)  His updated medication list for this problem includes:    Crestor 10 Mg Tabs (Rosuvastatin calcium) .Marland Kitchen... 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 1 once daily  Discussed use of medications, application of heat or cold, and  exercises.   Complete Medication List: 1)  Prinivil 20 Mg Tabs (Lisinopril) .... Take 1 tablet by mouth once a day 2)  Crestor 10 Mg Tabs (Rosuvastatin calcium) .Marland Kitchen.. 1 once daily 3)  Synthroid 50 Mcg Tabs (Levothyroxine sodium) .... Take 1 tablet by mouth once a day 4)  Bl Vitamin B-6 100 Mg Tabs (Pyridoxine hcl) .... Once daily 5)  Amlodipine Besylate 5 Mg Tabs (Amlodipine besylate) .... Once daily 6)  Saw Palmetto 160 Mg Caps (Saw palmetto (serenoa repens)) .... Once daily 7)  Travatan 0.004 % Soln (Travoprost) .... As directed 8)  Multivitamins Caps (Multiple vitamin) .Marland Kitchen.. 1 once daily 9)  Zantac 150 Mg Caps (Ranitidine hcl) .Marland Kitchen.. 1 once  daily 10)  Adult Aspirin Ec Low Strength 81 Mg Tbec (Aspirin) .Marland Kitchen.. 1 once daily 11)  Glucosamine-chondroitin-msm 500-250-250 Mg Caps (Glucosamine-chondroitin-msm) .... 2 two times a day 12)  Timoptic 0.5 % Soln (Timolol maleate) .Marland Kitchen.. 1 drop in both eyes two times a day 13)  Fish Oil Concentrate 1000 Mg Caps (Omega-3 fatty acids) .... Two  two times a day  Other Orders: Flu Vaccine 77yrs + MEDICARE PATIENTS (E9528) Administration Flu vaccine - MCR (G0008)  Lipid Assessment/Plan:      Based on NCEP/ATP III, the patient's risk factor category is "2 or more risk factors and a calculated 10 year CAD risk of < 20%".  The patient's lipid goals are as follows: Total cholesterol goal is 200; LDL cholesterol goal is 130; HDL cholesterol goal is 40; Triglyceride goal is 150.  His LDL cholesterol goal has been met.    Patient Instructions: 1)  Please schedule a follow-up appointment in 4 months. 2)  PSA prior to visit, ICD-9:

## 2010-04-24 NOTE — Assessment & Plan Note (Signed)
Summary: PT WILL COME IN FASTING/NJR   Vital Signs:  Patient profile:   77 year old male Height:      75 inches Weight:      197 pounds BMI:     24.71 Temp:     98.3 degrees F oral Pulse (ortho):   56 / minute Resp:     14 per minute BP sitting:   136 / 80  (left arm)  Vitals Entered By: Willy Eddy, LPN (June 06, 2009 10:51 AM) CC: annual visit for disease management-fasting this am   Primary Care Provider:  Stacie Glaze MD  CC:  annual visit for disease management-fasting this am.  History of Present Illness: The pt was asked about all immunizations, health maint. services that are appropriate to their age and was given guidance on diet exercize  and weight management note inclrease nail thicking consistant with fungal infection and increased nail pain note persistnat hoarseness after consult with ENT no improvement  Preventive Screening-Counseling & Management  Alcohol-Tobacco     Smoking Status: never     Passive Smoke Exposure: no  Problems Prior to Update: 1)  Hoarseness, Chronic  (ICD-784.49) 2)  Hyperglycemia  (ICD-790.29) 3)  Chronic Maxillary Sinusitis  (ICD-473.0) 4)  Allergic Rhinitis Due To Pollen  (ICD-477.0) 5)  Sialadenitis, Right  (ICD-527.2) 6)  Preoperative Examination  (ICD-V72.84) 7)  Weight Loss, Recent  (ICD-783.21) 8)  Preventive Health Care  (ICD-V70.0) 9)  Neoplasm, Malignant, Prostate, Hx Of, S/p Turp  (ICD-V10.46) 10)  Osteoarthritis  (ICD-715.90) 11)  Hypothyroidism  (ICD-244.9) 12)  Diverticulosis, Colon  (ICD-562.10) 13)  Hypertension  (ICD-401.9) 14)  Hyperlipidemia  (ICD-272.4) 15)  Colonic Polyps, Hx of  (ICD-V12.72)  Current Problems (verified): 1)  Hoarseness, Chronic  (ICD-784.49) 2)  Hyperglycemia  (ICD-790.29) 3)  Chronic Maxillary Sinusitis  (ICD-473.0) 4)  Allergic Rhinitis Due To Pollen  (ICD-477.0) 5)  Sialadenitis, Right  (ICD-527.2) 6)  Preoperative Examination  (ICD-V72.84) 7)  Weight Loss, Recent   (ICD-783.21) 8)  Preventive Health Care  (ICD-V70.0) 9)  Neoplasm, Malignant, Prostate, Hx Of, S/p Turp  (ICD-V10.46) 10)  Osteoarthritis  (ICD-715.90) 11)  Hypothyroidism  (ICD-244.9) 12)  Diverticulosis, Colon  (ICD-562.10) 13)  Hypertension  (ICD-401.9) 14)  Hyperlipidemia  (ICD-272.4) 15)  Colonic Polyps, Hx of  (ICD-V12.72)  Medications Prior to Update: 1)  Prinivil 20 Mg  Tabs (Lisinopril) .... Take 1 Tablet By Mouth Once A Day 2)  Crestor 10 Mg Tabs (Rosuvastatin Calcium) .Marland Kitchen.. 1 Once Daily 3)  Synthroid 50 Mcg  Tabs (Levothyroxine Sodium) .... Take 1 Tablet By Mouth Once A Day 4)  Bl Vitamin B-6 100 Mg  Tabs (Pyridoxine Hcl) .... Once Daily 5)  Amlodipine Besylate 5 Mg  Tabs (Amlodipine Besylate) .... Once Daily 6)  Saw Palmetto 160 Mg  Caps (Saw Palmetto (Serenoa Repens)) .... Once Daily 7)  Travatan 0.004 %  Soln (Travoprost) .... As Directed 8)  Brimonidine Tartrate 0.2 %  Soln (Brimonidine Tartrate) .... Two Times A Day 9)  Multivitamins   Caps (Multiple Vitamin) .Marland Kitchen.. 1 Once Daily 10)  Zantac 150 Mg  Caps (Ranitidine Hcl) .Marland Kitchen.. 1 Once Daily 11)  Adult Aspirin Ec Low Strength 81 Mg Tbec (Aspirin) .Marland Kitchen.. 1 Once Daily 12)  Glucosamine-Chondroitin-Msm 500-250-250 Mg Caps (Glucosamine-Chondroitin-Msm) .... 2 Once Daily 13)  Timoptic 0.5 % Soln (Timolol Maleate) .Marland Kitchen.. 1 Drop in Both Eyes Two Times A Day 14)  Astepro 0.15 % Soln (Azelastine Hcl) .... Two Spray in Each Nostril  Once A Day 15)  Prilosec 20 Mg Cpdr (Omeprazole) .... Take 1 Capsule By Mouth Two Times A Day 16)  Fish Oil Concentrate 1000 Mg Caps (Omega-3 Fatty Acids) .... Two  Two Times A Day  Current Medications (verified): 1)  Prinivil 20 Mg  Tabs (Lisinopril) .... Take 1 Tablet By Mouth Once A Day 2)  Crestor 10 Mg Tabs (Rosuvastatin Calcium) .Marland Kitchen.. 1 Once Daily 3)  Synthroid 50 Mcg  Tabs (Levothyroxine Sodium) .... Take 1 Tablet By Mouth Once A Day 4)  Bl Vitamin B-6 100 Mg  Tabs (Pyridoxine Hcl) .... Once Daily 5)   Amlodipine Besylate 5 Mg  Tabs (Amlodipine Besylate) .... Once Daily 6)  Saw Palmetto 160 Mg  Caps (Saw Palmetto (Serenoa Repens)) .... Once Daily 7)  Travatan 0.004 %  Soln (Travoprost) .... As Directed 8)  Multivitamins   Caps (Multiple Vitamin) .Marland Kitchen.. 1 Once Daily 9)  Zantac 150 Mg  Caps (Ranitidine Hcl) .Marland Kitchen.. 1 Once Daily 10)  Adult Aspirin Ec Low Strength 81 Mg Tbec (Aspirin) .Marland Kitchen.. 1 Once Daily 11)  Glucosamine-Chondroitin-Msm 500-250-250 Mg Caps (Glucosamine-Chondroitin-Msm) .... 2 Once Daily 12)  Timoptic 0.5 % Soln (Timolol Maleate) .Marland Kitchen.. 1 Drop in Both Eyes Two Times A Day 13)  Fish Oil Concentrate 1000 Mg Caps (Omega-3 Fatty Acids) .... Two  Two Times A Day  Allergies (verified): No Known Drug Allergies  Past History:  Family History: Last updated: 03-17-07 mother died at 2  CAD  ( advanced age) father  died at 22 with pancreatic CA  Social History: Last updated: 11/27/2006 Retired Married Never Smoked Alcohol use-no Drug use-no Regular exercise-yes  Risk Factors: Caffeine Use: 0 (03/17/2007) Exercise: yes (11/27/2006)  Risk Factors: Smoking Status: never (06/06/2009) Passive Smoke Exposure: no (06/06/2009)  Past medical, surgical, family and social histories (including risk factors) reviewed, and no changes noted (except as noted below).  Past Medical History: Reviewed history from 11/27/2006 and no changes required. Colonic polyps, hx of Hyperlipidemia Hypertension BET Diverticulosis, colon Hypothyroidism Osteoarthritis Benign prostatic hypertrophy  Past Surgical History: Reviewed history from 11/27/2006 and no changes required. Colonoscopy-1998 Rotator cuff repair arthroscopy to right knee hydrocele  Family History: Reviewed history from 2007/03/17 and no changes required. mother died at 81  CAD  ( advanced age) father  died at 30 with pancreatic CA  Social History: Reviewed history from 11/27/2006 and no changes  required. Retired Married Never Smoked Alcohol use-no Drug use-no Regular exercise-yes  Review of Systems  The patient denies anorexia, fever, weight loss, weight gain, vision loss, decreased hearing, hoarseness, chest pain, syncope, dyspnea on exertion, peripheral edema, prolonged cough, headaches, hemoptysis, abdominal pain, melena, hematochezia, severe indigestion/heartburn, hematuria, incontinence, genital sores, muscle weakness, suspicious skin lesions, transient blindness, difficulty walking, depression, unusual weight change, abnormal bleeding, enlarged lymph nodes, angioedema, breast masses, and testicular masses.    Physical Exam  General:  Well-developed,well-nourished,in no acute distress; alert,appropriate and cooperative throughout examination Head:  normocephalic, atraumatic, and male-pattern balding.   Eyes:  pupils equal.   Ears:  R ear normal and L ear normal.   Nose:  no external deformity and no nasal discharge.   Mouth:  good dentition and pharynx pink and moist.   Neck:  No deformities, masses, or tenderness noted. Lungs:  Normal respiratory effort, chest expands symmetrically. Lungs are clear to auscultation, no crackles or wheezes. Heart:  Normal rate and regular rhythm. S1 and S2 normal without gallop, murmur, click, rub or other extra sounds. Abdomen:  Bowel sounds positive,abdomen  soft and non-tender without masses, organomegaly or hernias noted. Rectal:  normal sphincter tone and external hemorrhoid(s).   Genitalia:  no urethral discharge.   Prostate:  no gland enlargement and no nodules.   Msk:  no joint warmth, no redness over joints, and no joint deformities.   Extremities:  No clubbing, cyanosis, edema, or deformity noted with normal full range of motion of all joints.   Neurologic:  alert & oriented X3 and DTRs symmetrical and normal.   Cervical Nodes:  No lymphadenopathy noted Axillary Nodes:  No palpable lymphadenopathy Psych:  Cognition and judgment  appear intact. Alert and cooperative with normal attention span and concentration. No apparent delusions, illusions, hallucinations   Impression & Recommendations:  Problem # 1:  PREVENTIVE HEALTH CARE (ICD-V70.0) Assessment Improved  Colonoscopy:  Results: Hemorrhoids.     Results: Diverticulosis.        (04/26/2005) Td Booster: Historical (03/25/2001)   Flu Vax: Fluvax 3+ (02/08/2009)   Pneumovax: Historical (03/25/2001) Chol: 146 (02/08/2009)   HDL: 38.80 (02/08/2009)   LDL: 97 (02/08/2009)   TG: 49.0 (02/08/2009) TSH: 1.05 (11/22/2008)   HgbA1C: 6.0 (10/18/2008)   PSA: 0.03 (02/08/2009) Next Colonoscopy due:: 04/2010 (11/27/2006)  Discussed using sunscreen, use of alcohol, drug use, self testicular exam, routine dental care, routine eye care, routine physical exam, seat belts, multiple vitamins, osteoporosis prevention, adequate calcium intake in diet, and recommendations for immunizations.  Discussed exercise and checking cholesterol.  Discussed gun safety, safe sex, and contraception. Also recommend checking PSA.  Orders: TLB-CBC Platelet - w/Differential (85025-CBCD) TLB-BMP (Basic Metabolic Panel-BMET) (80048-METABOL) TLB-TSH (Thyroid Stimulating Hormone) (84443-TSH) TLB-PSA (Prostate Specific Antigen) (84153-PSA)  Problem # 2:  HYPERTENSION (ICD-401.9) Assessment: Improved  His updated medication list for this problem includes:    Prinivil 20 Mg Tabs (Lisinopril) .Marland Kitchen... Take 1 tablet by mouth once a day    Amlodipine Besylate 5 Mg Tabs (Amlodipine besylate) ..... Once daily  BP today: 136/80 Prior BP: 110/70 (02/21/2009)  Prior 10 Yr Risk Heart Disease: 22 % (10/18/2008)  Labs Reviewed: K+: 4.5 (02/08/2009) Creat: : 1.0 (02/08/2009)   Chol: 146 (02/08/2009)   HDL: 38.80 (02/08/2009)   LDL: 97 (02/08/2009)   TG: 49.0 (02/08/2009)  Problem # 3:  HOARSENESS, CHRONIC (ICD-784.49) control allergies  Problem # 4:  ALLERGIC RHINITIS DUE TO POLLEN (ICD-477.0) atrovent  spray  Problem # 5:  ONYCHOMYCOSIS (ICD-110.1)  His updated medication list for this problem includes:    Terbinafine Hcl 250 Mg Tabs (Terbinafine hcl) ..... One by mouth dail for 90 days  Complete Medication List: 1)  Prinivil 20 Mg Tabs (Lisinopril) .... Take 1 tablet by mouth once a day 2)  Crestor 10 Mg Tabs (Rosuvastatin calcium) .Marland Kitchen.. 1 once daily 3)  Synthroid 50 Mcg Tabs (Levothyroxine sodium) .... Take 1 tablet by mouth once a day 4)  Bl Vitamin B-6 100 Mg Tabs (Pyridoxine hcl) .... Once daily 5)  Amlodipine Besylate 5 Mg Tabs (Amlodipine besylate) .... Once daily 6)  Saw Palmetto 160 Mg Caps (Saw palmetto (serenoa repens)) .... Once daily 7)  Travatan 0.004 % Soln (Travoprost) .... As directed 8)  Multivitamins Caps (Multiple vitamin) .Marland Kitchen.. 1 once daily 9)  Zantac 150 Mg Caps (Ranitidine hcl) .Marland Kitchen.. 1 once daily 10)  Adult Aspirin Ec Low Strength 81 Mg Tbec (Aspirin) .Marland Kitchen.. 1 once daily 11)  Glucosamine-chondroitin-msm 500-250-250 Mg Caps (Glucosamine-chondroitin-msm) .... 2 once daily 12)  Timoptic 0.5 % Soln (Timolol maleate) .Marland Kitchen.. 1 drop in both eyes two times a day 13)  Fish Oil Concentrate 1000 Mg Caps (Omega-3 fatty acids) .... Two  two times a day 14)  Ipratropium Bromide 0.02 % Soln (Ipratropium bromide) .... Two stray in nostril two times a day 15)  Terbinafine Hcl 250 Mg Tabs (Terbinafine hcl) .... One by mouth dail for 90 days  Other Orders: TLB-Lipid Panel (80061-LIPID) TLB-Hepatic/Liver Function Pnl (80076-HEPATIC)  Patient Instructions: 1)  Please schedule a follow-up appointment in 3 months. Prescriptions: TERBINAFINE HCL 250 MG TABS (TERBINAFINE HCL) one by mouth dail for 90 days  #90 x 0   Entered and Authorized by:   Stacie Glaze MD   Signed by:   Stacie Glaze MD on 06/06/2009   Method used:   Print then Give to Patient   RxID:   [id number] IPRATROPIUM BROMIDE 0.02 % SOLN (IPRATROPIUM BROMIDE) two stray in nostril two times a day  #1 unit x 6    Entered and Authorized by:   Stacie Glaze MD   Signed by:   Stacie Glaze MD on 06/06/2009   Method used:   Electronically to        [facility]  [phone]* (retail)       [address]       [place], [place]  [address]       Ph: [phone] or [phone]       Fax: [phone]   RxID:   [id number]      Prevention & Chronic Care Immunizations   Influenza vaccine: Fluvax 3+  (02/08/2009)   Influenza vaccine due: 11/23/2009    Tetanus booster: 03/25/2001: Historical   Tetanus booster due: 03/26/2011    Pneumococcal vaccine: Historical  (03/25/2001)   Pneumococcal vaccine deferral: Not indicated  (06/06/2009)    H. zoster vaccine: Not documented  Colorectal Screening   Hemoccult: Not documented    Colonoscopy:  Results: Hemorrhoids.     Results: Diverticulosis.         (04/26/2005)   Colonoscopy action/deferral: Repeat colonoscopy in 5 years.   (04/26/2005)   Colonoscopy due: 04/2010  Other Screening   PSA: 0.03  (02/08/2009)   PSA ordered.   PSA action/deferral: Discussed-PSA requested  (06/06/2009)   Smoking status: never  (06/06/2009)  Lipids   Total Cholesterol: 146  (02/08/2009)   Lipid panel action/deferral: Lipid Panel ordered   LDL: 97  (02/08/2009)   LDL Direct: Not documented   HDL: 38.80  (02/08/2009)   Triglycerides: 49.0  (02/08/2009)    SGOT (AST): 35  (02/08/2009)   BMP action: Ordered   SGPT (ALT): 24  (02/08/2009)   Alkaline phosphatase: 45  (02/08/2009)   Total bilirubin: 0.9  (02/08/2009)   Progress toward LDL goal: At goal    Stage of readiness to change (lipid management): Maintenance  Hypertension   Last Blood Pressure: 136 / 80  (06/06/2009)   Serum creatinine: 1.0  (02/08/2009)   Serum potassium 4.5  (02/08/2009)    Hypertension flowsheet reviewed?: Yes   Progress toward BP goal: At goal    Stage of readiness to change (hypertension management): Maintenance  Self-Management  Support :    Hypertension self-management support: Not documented    Lipid self-management support: Not documented

## 2010-04-24 NOTE — Progress Notes (Signed)
Summary: refill meds.  Phone Note Call from Patient   Caller: Patient Call For: Stacie Glaze MD Reason for Call: Acute Illness Summary of Call: Pt needs all his meds printed out and signed and he willl pick up ........[phone]  Names of meds on voice mail.  Cannot understand him very well. Initial call taken by: Lynann Beaver CMA,  April 19, 2009 3:52 PM  Follow-up for Phone Call        ready for pick and wife informed Follow-up by: Willy Eddy, LPN,  April 19, 2009 4:09 PM    Prescriptions: ZANTAC 150 MG  CAPS (RANITIDINE HCL) 1 once daily  #90 Each x 3   Entered by:   Willy Eddy, LPN   Authorized by:   Stacie Glaze MD   Signed by:   Willy Eddy, LPN on 45/40/9811   Method used:   Print then Give to Patient   RxID:   [id number] CRESTOR 10 MG TABS (ROSUVASTATIN CALCIUM) 1 once daily  #90 x 3   Entered by:   Willy Eddy, LPN   Authorized by:   Stacie Glaze MD   Signed by:   Willy Eddy, LPN on 78/46/9629   Method used:   Print then Give to Patient   RxID:   [id number] SYNTHROID 50 MCG  TABS (LEVOTHYROXINE SODIUM) Take 1 tablet by mouth once a day  #90 x 3   Entered by:   Willy Eddy, LPN   Authorized by:   Stacie Glaze MD   Signed by:   Willy Eddy, LPN on 36/64/4034   Method used:   Print then Give to Patient   RxID:   [id number] AMLODIPINE BESYLATE 5 MG  TABS (AMLODIPINE BESYLATE) once daily  #90 x 3   Entered by:   Willy Eddy, LPN   Authorized by:   Stacie Glaze MD   Signed by:   Willy Eddy, LPN on 95/18/8416   Method used:   Print then Give to Patient   RxID:   [id number] PRINIVIL 20 MG  TABS (LISINOPRIL) Take 1 tablet by mouth once a day  #90 Each x 3   Entered by:   Willy Eddy, LPN   Authorized by:   Stacie Glaze MD   Signed by:   Willy Eddy, LPN on 73/22/0254   Method used:   Print then Give to Patient   RxID:   [id number]

## 2010-04-24 NOTE — Assessment & Plan Note (Signed)
Summary: Painful hand/dm   Vital Signs:  Patient profile:   77 year old male Weight:      198 pounds Temp:     97.8 degrees F oral BP sitting:   124 / 80  (left arm) Cuff size:   regular  Vitals Entered By: Kern Reap CMA Duncan Dull) (January 17, 2010 11:02 AM) CC: left wrist pain Is Patient Diabetic? No Pain Assessment Patient in pain? yes     Location: left wrist   Primary Care Provider:  Stacie Glaze MD  CC:  left wrist pain.  History of Present Illness: Daniel Everett is a 77 year old male patient of Dr. Lovell Sheehan who comes in today following a fall yesterday.  He states he lives at his son's house.  He tripped over a ladder fell in his hands in his right hip and right knee.  Didn't think much about it at the time however, today he has , severe pain in his left wrist.  He points to the navicular as a source of his pain.   3 years left on his tetanus booster  Allergies: No Known Drug Allergies  Social History: Reviewed history from 11/27/2006 and no changes required. Retired Married Never Smoked Alcohol use-no Drug use-no Regular exercise-yes  Review of Systems      See HPI  Physical Exam  General:  Well-developed,well-nourished,in no acute distress; alert,appropriate and cooperative throughout examination Msk:  there is a 1 inch superficial abrasions to the anterior right knee and a small abrasion, right lower lateral hip.  Hip joint normal knee normal.  The swelling of the left wrist.  Full range of motion, however, point tenderness over the navicular Pulses:  R and L carotid,radial,femoral,dorsalis pedis and posterior tibial pulses are full and equal bilaterally   Impression & Recommendations:  Problem # 1:  CONTUSIONS, MULTIPLE (ICD-924.8) Assessment New  Complete Medication List: 1)  Prinivil 20 Mg Tabs (Lisinopril) .... Take 1 tablet by mouth once a day 2)  Crestor 10 Mg Tabs (Rosuvastatin calcium) .Marland Kitchen.. 1 once daily 3)  Synthroid 50 Mcg Tabs  (Levothyroxine sodium) .... Take 1 tablet by mouth once a day 4)  Bl Vitamin B-6 100 Mg Tabs (Pyridoxine hcl) .... Once daily 5)  Amlodipine Besylate 5 Mg Tabs (Amlodipine besylate) .... Once daily 6)  Saw Palmetto 160 Mg Caps (Saw palmetto (serenoa repens)) .... Once daily 7)  Travatan 0.004 % Soln (Travoprost) .... As directed 8)  Multivitamins Caps (Multiple vitamin) .Marland Kitchen.. 1 once daily 9)  Zantac 150 Mg Caps (Ranitidine hcl) .Marland Kitchen.. 1 once daily 10)  Adult Aspirin Ec Low Strength 81 Mg Tbec (Aspirin) .Marland Kitchen.. 1 once daily 11)  Glucosamine-chondroitin-msm 500-250-250 Mg Caps (Glucosamine-chondroitin-msm) .... 2 two times a day 12)  Timoptic 0.5 % Soln (Timolol maleate) .Marland Kitchen.. 1 drop in both eyes two times a day 13)  Fish Oil Concentrate 1000 Mg Caps (Omega-3 fatty acids) .... Two  two times a day 14)  Vicodin Es 7.5-750 Mg Tabs (Hydrocodone-acetaminophen) .... 1/2 to 1 at bedtime as needed pain  Other Orders: T-Wrist Comp Left Min 3 Views (73110TC)  Patient Instructions: 1)  purchase a short arm splint for your left wrist,,,,,,,,,,, elevation, and ice 15 minutes 4 times a day, Motrin, 400 mg twice daily.  We will call you to the report on your x-ray Prescriptions: VICODIN ES 7.5-750 MG TABS (HYDROCODONE-ACETAMINOPHEN) 1/2 to 1 at bedtime as needed pain  #20 x 1   Entered and Authorized by:   Roderick Pee MD  Signed by:   Roderick Pee MD on 01/17/2010   Method used:   Printed then faxed to ...       [facility]  [address]  [phone]* (retail)       [address]       [place], [place]  [address]       Ph: [phone] or [phone]       Fax: [phone]   RxID:   [id number]    Orders Added: 1)  T-Wrist Comp Left Min 3 Views [73110TC] 2)  Est. Patient Level IV [56387]

## 2010-04-25 ENCOUNTER — Encounter (INDEPENDENT_AMBULATORY_CARE_PROVIDER_SITE_OTHER): Payer: Self-pay | Admitting: *Deleted

## 2010-04-26 ENCOUNTER — Encounter: Payer: Self-pay | Admitting: Gastroenterology

## 2010-04-26 NOTE — Letter (Signed)
Summary: Colonoscopy Letter  [facility]  [address] [place], [place] [address]   Phone: [phone]  Fax: [phone]      April 18, 2010 MRN: [id number]   Daniel Everett [address] [place], [place]  [address]   Dear [name],   According to your medical record, it is time for you to schedule a Colonoscopy. The American Cancer Society recommends this procedure as a method to detect early colon cancer. Patients with a family history of colon cancer, or a personal history of colon polyps or inflammatory bowel disease are at increased risk.  This letter has been generated based on the recommendations made at the time of your procedure. If you feel that in your particular situation this may no longer apply, please contact our office.  Please call our office at [phone] to schedule this appointment or to update your records at your earliest convenience.  Thank you for cooperating with [place] to provide you with the very best care possible.   Sincerely,  Judie Petit T. Russella Dar, M.D.  [facility] [phone]

## 2010-04-27 ENCOUNTER — Other Ambulatory Visit: Payer: MEDICARE | Admitting: Internal Medicine

## 2010-04-27 ENCOUNTER — Ambulatory Visit: Admit: 2010-04-27 | Payer: Self-pay | Admitting: Internal Medicine

## 2010-04-27 DIAGNOSIS — I10 Essential (primary) hypertension: Secondary | ICD-10-CM

## 2010-04-27 DIAGNOSIS — Z8546 Personal history of malignant neoplasm of prostate: Secondary | ICD-10-CM

## 2010-04-27 DIAGNOSIS — E785 Hyperlipidemia, unspecified: Secondary | ICD-10-CM

## 2010-04-27 DIAGNOSIS — E039 Hypothyroidism, unspecified: Secondary | ICD-10-CM

## 2010-04-27 DIAGNOSIS — Z0000 Encounter for general adult medical examination without abnormal findings: Secondary | ICD-10-CM

## 2010-04-27 LAB — BASIC METABOLIC PANEL
BUN: 15 mg/dL (ref 6–23)
Calcium: 8.9 mg/dL (ref 8.4–10.5)
Creatinine, Ser: 1 mg/dL (ref 0.4–1.5)
GFR: 77.08 mL/min (ref >60.00)
Glucose, Bld: 92 mg/dL (ref 70–99)

## 2010-04-27 LAB — CBC WITH DIFFERENTIAL/PLATELET
Basophils Absolute: 0 10*3/uL (ref 0.0–0.1)
Lymphocytes Relative: 33.3 % (ref 12.0–46.0)
Monocytes Relative: 7.5 % (ref 3.0–12.0)
Platelets: 162 10*3/uL (ref 150.0–400.0)
RDW: 13.5 % (ref 11.5–14.6)

## 2010-04-27 LAB — LIPID PANEL
Cholesterol: 132 mg/dL (ref 0–200)
HDL: 34.4 mg/dL — ABNORMAL LOW (ref >39.00)
LDL Cholesterol: 88 mg/dL (ref 0–99)
VLDL: 9.8 mg/dL (ref 0.0–40.0)

## 2010-04-27 LAB — TSH: TSH: 1.79 u[IU]/mL (ref 0.35–5.50)

## 2010-04-27 LAB — POCT URINALYSIS DIPSTICK
Blood, UA: NEGATIVE
Ketones, UA: NEGATIVE
Nitrite, UA: NEGATIVE
Protein, UA: NEGATIVE
pH, UA: 6

## 2010-04-27 LAB — HEPATIC FUNCTION PANEL
AST: 31 U/L (ref 0–37)
Alkaline Phosphatase: 37 U/L — ABNORMAL LOW (ref 39–117)
Total Bilirubin: 0.8 mg/dL (ref 0.3–1.2)

## 2010-05-02 NOTE — Miscellaneous (Signed)
Summary: [facility] Previsit/prep  Clinical Lists Changes  Medications: Added new medication of MOVIPREP 100 GM  SOLR (PEG-KCL-NACL-NASULF-NA ASC-C) As per prep instructions. - Signed Rx of MOVIPREP 100 GM  SOLR (PEG-KCL-NACL-NASULF-NA ASC-C) As per prep instructions.;  #1 x 0;  Signed;  Entered by: Wyona Almas RN;  Authorized by: Meryl Dare MD [facility];  Method used: Electronically to [facility]  [id number]*, [address], [place], [place], [place]  [address], Ph: [phone] or [phone], Fax: [phone] Observations: Added new observation of NKA: T (04/26/2010 10:37)    Prescriptions: MOVIPREP 100 GM  SOLR (PEG-KCL-NACL-NASULF-NA ASC-C) As per prep instructions.  #1 x 0   Entered by:   Wyona Almas RN   Authorized by:   Meryl Dare MD [facility]   Signed by:   Wyona Almas RN on 04/26/2010   Method used:   Electronically to        [facility]  [phone]* (retail)       [address]       [place], [place]  [address]       Ph: [phone] or [phone]       Fax: [phone]   RxID:   [id number]

## 2010-05-02 NOTE — Letter (Signed)
Summary: [facility] Instructions  [facility]  [address] [place], [place] [address]   Phone: [phone]  Fax: [phone]       Daniel Everett    75-May-1933    MRN: [id number]        Procedure Day Dorna Bloom:  Duanne Limerick  05/14/10     Arrival Time:  10:00AM     Procedure Time:  11:00AM     [facility] of Procedure:                    [facility] ([facility]                      PREPARATION FOR COLONOSCOPY WITH MOVIPREP   Starting 5 days prior to your procedure 05/09/10 do not eat nuts, seeds, popcorn, corn, beans, peas,  salads, or any raw vegetables.  Do not take any fiber supplements (e.g. Metamucil, Citrucel, and Benefiber).  THE DAY BEFORE YOUR PROCEDURE         DATE: 05/13/10  DAY: SUNDAY  1.  Drink clear liquids the entire day-NO SOLID FOOD  2.  Do not drink anything colored red or purple.  Avoid juices with pulp.  No orange juice.  3.  Drink at least 64 oz. (8 glasses) of fluid/clear liquids during the day to prevent dehydration and help the prep work efficiently.  CLEAR LIQUIDS INCLUDE: Water Jello Ice Popsicles Tea (sugar ok, no milk/cream) Powdered fruit flavored drinks Coffee (sugar ok, no milk/cream) Gatorade Juice: apple, white grape, white cranberry  Lemonade Clear bullion, consomm, broth Carbonated beverages (any kind) Strained chicken noodle soup Hard Candy                             4.  In the morning, mix first dose of MoviPrep solution:    Empty 1 Pouch A and 1 Pouch B into the disposable container    Add lukewarm drinking water to the top line of the container. Mix to dissolve    Refrigerate (mixed solution should be used within 24 hrs)  5.  Begin drinking the prep at 5:00 p.m. The MoviPrep container is divided by 4 marks.   Every 15 minutes drink the solution down to the next mark (approximately 8 oz) until the full liter is complete.   6.  Follow completed prep with 16 oz of clear liquid of your choice (Nothing  red or purple).  Continue to drink clear liquids until bedtime.  7.  Before going to bed, mix second dose of MoviPrep solution:    Empty 1 Pouch A and 1 Pouch B into the disposable container    Add lukewarm drinking water to the top line of the container. Mix to dissolve    Refrigerate  THE DAY OF YOUR PROCEDURE      DATE: 05/14/10   DAY: MONDAY  Beginning at 6:00AM (5 hours before procedure):         1. Every 15 minutes, drink the solution down to the next mark (approx 8 oz) until the full liter is complete.  2. Follow completed prep with 16 oz. of clear liquid of your choice.    3. You may drink clear liquids until 9:00AM (2 HOURS BEFORE PROCEDURE).   MEDICATION INSTRUCTIONS  Unless otherwise instructed, you should take regular prescription medications with a small sip of water   as early as possible the morning of  your procedure.        OTHER INSTRUCTIONS  You will need a responsible adult at least 75 years of age to accompany you and drive you home.   This person must remain in the waiting room during your procedure.  Wear loose fitting clothing that is easily removed.  Leave jewelry and other valuables at home.  However, you may wish to bring a book to read or  an iPod/MP3 player to listen to music as you wait for your procedure to start.  Remove all body piercing jewelry and leave at home.  Total time from sign-in until discharge is approximately 2-3 hours.  You should go home directly after your procedure and rest.  You can resume normal activities the  day after your procedure.  The day of your procedure you should not:   Drive   Make legal decisions   Operate machinery   Drink alcohol   Return to work  You will receive specific instructions about eating, activities and medications before you leave.    The above instructions have been reviewed and explained to me by   Wyona Almas RN  April 26, 2010 11:02 AM     I fully understand and  can verbalize these instructions _____________________________ Date _________

## 2010-05-04 ENCOUNTER — Ambulatory Visit (INDEPENDENT_AMBULATORY_CARE_PROVIDER_SITE_OTHER): Payer: MEDICARE | Admitting: Internal Medicine

## 2010-05-04 ENCOUNTER — Encounter: Payer: Self-pay | Admitting: Internal Medicine

## 2010-05-04 DIAGNOSIS — Z0000 Encounter for general adult medical examination without abnormal findings: Secondary | ICD-10-CM | POA: Insufficient documentation

## 2010-05-04 DIAGNOSIS — M1990 Unspecified osteoarthritis, unspecified site: Secondary | ICD-10-CM

## 2010-05-04 DIAGNOSIS — I10 Essential (primary) hypertension: Secondary | ICD-10-CM

## 2010-05-04 DIAGNOSIS — R7309 Other abnormal glucose: Secondary | ICD-10-CM

## 2010-05-04 DIAGNOSIS — E785 Hyperlipidemia, unspecified: Secondary | ICD-10-CM

## 2010-05-04 DIAGNOSIS — E039 Hypothyroidism, unspecified: Secondary | ICD-10-CM

## 2010-05-04 DIAGNOSIS — Z8546 Personal history of malignant neoplasm of prostate: Secondary | ICD-10-CM

## 2010-05-04 NOTE — Assessment & Plan Note (Signed)
He is on Crestor for his hyperlipidemia and his LDL is 88 with a HDL 34 and a total cholesterol of 132 liver functions are normal on this medication continuing Crestor to current dose is warranted.

## 2010-05-04 NOTE — Assessment & Plan Note (Signed)
The patient has a routine PSA drawn for for a yearly basis for monitoring of a history of prostate cancer as an indicator of recurrence of disease his PSA this year is . 02 which is less than his previous value and a good indicator that he continues in remission from this disease

## 2010-05-04 NOTE — Assessment & Plan Note (Signed)
On previous laboratory studies elevated blood glucose and fasting labs were noted however on his current fasting blood work because of a glucose of 92 he has no symptoms of hyperglycemia at this time we will resolve this problem

## 2010-05-04 NOTE — Assessment & Plan Note (Signed)
The patient presents for his yearly prevention visit he is up-to-date with all immunizations health maintenance protocols screening laboratory values revealed normal renal function and liver function and blood count problem focused laboratory values included a lipid and PSA and potassium and creatinine for hypertension which were reviewed in the problem focused part of this examination.  I counseled the patient on weight loss exercise continued relation of the brain 2 portals and other activities to prevent loss of cognitive function because of the person to continue getting a yearly flu shot I consider the shingles vaccination.

## 2010-05-04 NOTE — Assessment & Plan Note (Signed)
The patient's renal function is stable and blood pressure is well-controlled on the current regimen serum creatinine is 1.0

## 2010-05-04 NOTE — Assessment & Plan Note (Signed)
The patient's TSH was 1.79 which is excellent he is stable on his current medications no changes are indicated

## 2010-05-04 NOTE — Progress Notes (Signed)
Subjective:     Patient ID: Daniel Everett, male   DOB: 11-Nov-1931, 77 y.o.   MRN: [id number]  HPI patient is a 77 year old white male who presents for his annual wellness examination as well as review of his chronic medical problems which include hypothyroidism hyper lipidemia  History of prostate cancer and treatment for fungal nail he is doing well and has no acute complaints his weight has stabilized he is compliant with his blood pressure medicine that reports no chest pain shortness of breath PND or orthopnea he reports no peripheral edema.  He is compliant with his thyroid replacement that his lipid medications and has done well he has no evidence of depression functional status change he is alert oriented fully functional 4 cognitive ability no limitations in his activities of daily living   Review of Systems  Constitutional: Negative for fever and fatigue.  HENT: Negative for hearing loss, congestion, neck pain and postnasal drip.   Eyes: Negative for discharge, redness and visual disturbance.  Respiratory: Negative for cough, shortness of breath and wheezing.   Cardiovascular: Negative for leg swelling.  Gastrointestinal: Negative for abdominal pain, constipation and abdominal distention.  Genitourinary: Negative for urgency and frequency.  Musculoskeletal: Negative for joint swelling and arthralgias.  Skin: Negative for color change and rash.  Neurological: Negative for weakness and light-headedness.  Hematological: Negative for adenopathy.  Psychiatric/Behavioral: Negative for behavioral problems.       Objective:   Physical Exam  Constitutional: He appears well-developed and well-nourished.        Thin white male in no apparent distress slight male pattern balding gray hair  HENT:  Head: Normocephalic and atraumatic.  Eyes: Conjunctivae are normal. Pupils are equal, round, and reactive to light.  Neck: Normal range of motion. Neck supple.  Cardiovascular: Normal rate and  regular rhythm.         He has no edema in his lower extremities he notes increased retinacular veins without varicose veins there are no carotid bruits or evidence of PAD  Pulmonary/Chest: Effort normal and breath sounds normal.  Abdominal: Soft. Bowel sounds are normal.  Skin:        The patient's scan shows chronic changes of age with seborrheic keratosis ichthyosis and dry skin       Assessment:    see problems    Plan:    see problems

## 2010-05-14 ENCOUNTER — Encounter: Payer: Self-pay | Admitting: Gastroenterology

## 2010-05-14 ENCOUNTER — Other Ambulatory Visit (AMBULATORY_SURGERY_CENTER): Payer: MEDICARE | Admitting: Gastroenterology

## 2010-05-14 DIAGNOSIS — Z860100 Personal history of colon polyps, unspecified: Secondary | ICD-10-CM

## 2010-05-14 DIAGNOSIS — K5730 Diverticulosis of large intestine without perforation or abscess without bleeding: Secondary | ICD-10-CM

## 2010-05-14 DIAGNOSIS — Z800 Family history of malignant neoplasm of digestive organs: Secondary | ICD-10-CM

## 2010-05-14 DIAGNOSIS — Z1211 Encounter for screening for malignant neoplasm of colon: Secondary | ICD-10-CM

## 2010-05-22 NOTE — Procedures (Signed)
Summary: Colonoscopy  Patient: Daniel Everett Note: All result statuses are Final unless otherwise noted.  Tests: (1) Colonoscopy (COL)   COL Colonoscopy           DONE     [facility]     [address]     [place], [place]  [address]           COLONOSCOPY PROCEDURE REPORT     PATIENT:  Daniel Everett, Daniel Everett  MR#:  [id number]     BIRTHDATE:  07/02/1931, 78 yrs. old  GENDER:  male     ENDOSCOPIST:  Judie Petit T. Russella Dar, MD, [facility]           PROCEDURE DATE:  05/14/2010     PROCEDURE:  Colonoscopy 29562     ASA CLASS:  Class II     INDICATIONS:  1) surveillance and high-risk screening  2) family     history of colon cancer; brother 3) history of pre-cancerous     (adenomatous) colon polyps: 1998.     MEDICATIONS:   Fentanyl 37.5 mcg IV, Versed 3 mg IV     DESCRIPTION OF PROCEDURE:   After the risks benefits and     alternatives of the procedure were thoroughly explained, informed     consent was obtained.  Digital rectal exam was performed and     revealed no abnormalities.   The LB PCF-Q180AL [id number] endoscope     was introduced through the anus and advanced to the cecum, which     was identified by both the appendix and ileocecal valve, without     limitations.  The quality of the prep was good, using MoviPrep.     The instrument was then slowly withdrawn as the colon was fully     examined.     <<PROCEDUREIMAGES>>     FINDINGS:  Moderate diverticulosis was found in the sigmoid to     descending colon. A normal appearing cecum, ileocecal valve, and     appendiceal orifice were identified. The ascending, hepatic     flexure, transverse, splenic flexure, and rectum appeared     unremarkable. Retroflexed views in the rectum revealed internal     hemorrhoids, small. The time to cecum =  3.5  minutes. The scope     was then withdrawn (time =  8.75  min) from the patient and the     procedure completed.           COMPLICATIONS:  None           ENDOSCOPIC IMPRESSION:     1) Moderate  diverticulosis in the sigmoid to descending colon     2) Internal hemorrhoids           RECOMMENDATIONS:     1) High fiber diet with liberal fluid intake.     2) Repeat Colonoscopy in 5 years.           Venita Lick. Russella Dar, MD, [facility]           CC:  Stacie Glaze, MD           n.     Rosalie DoctorVenita Lick. Dalexa Gentz at 05/14/2010 11:40 AM           Constance Holster, [id number]  Note: An exclamation mark (!) indicates a result that was not dispersed into the flowsheet. Document Creation Date: 05/14/2010 11:41 AM _______________________________________________________________________  (1) Order result status: Final Collection or observation date-time: 05/14/2010 11:37 Requested date-time:  Receipt date-time:  Reported date-time:  Referring Physician:   Ordering Physician: Claudette Head [phone]) Specimen Source:  Source: Launa Grill Order Number: [id number] Lab site:   Appended Document: Colonoscopy    Clinical Lists Changes  Observations: Added new observation of COLONNXTDUE: 04/2015 (05/14/2010 15:50)

## 2010-06-26 ENCOUNTER — Other Ambulatory Visit: Payer: Self-pay | Admitting: Dermatology

## 2010-08-07 NOTE — Op Note (Signed)
NAME:  Daniel Everett, Daniel Everett NO.:  [id number]   MEDICAL RECORD NO.:  [id number]          PATIENT TYPE:  OIB   LOCATION:  0098                         FACILITY:  [facility]   PHYSICIAN:  Jamison Neighbor, M.D.  DATE OF BIRTH:  1931-11-26   DATE OF PROCEDURE:  02/27/2007  DATE OF DISCHARGE:                               OPERATIVE REPORT   SERVICE:  Urology.   PREOPERATIVE DIAGNOSIS:  Benign prostatic hyperplasia with bladder  obstruction.   POSTOPERATIVE DIAGNOSIS:  Benign prostatic hyperplasia with bladder  obstruction.   PROCEDURE:  Cystoscopy and TURP (transurethral resection of the  prostate).   SURGEON:  Jamison Neighbor, M.D.   ANESTHESIA:  General.   COMPLICATIONS:  None.   DRAINS:  A 24 French 3 way Foley catheter drain.   SPECIMENS:  Chips sent to pathology.   HISTORY:  This 77 year old male has had problems with bladder  obstruction.  He has been treated with 5-alpha reductase inhibitors,  alpha blockers as well as anticholinergics and despite combination  therapy has never gotten the symptoms under control.  He has also added  to that over-the-counter agents such as saw palmetto.  The patient  certainly does have evidence on urodynamics of bladder outlet  obstruction.  He is interested to undergo a TURP.  He is aware of the  fact that there is a 1% chance of incontinence or impotency and also  knows that he likely will require anticholinergic therapy  postoperatively to treat frequency and urgency until he is fully  recovered.  The patient gave full informed consent for the procedure.   PROCEDURE IN DETAIL:  After successful induction of general anesthesia  the patient was placed in the dorsal lithotomy position, prepped with  Betadine and draped in the usual sterile fashion.  The urethra was  dilated up to 30 [place] with [employer] sounds.  The continuous flow  resectoscope sheath was then inserted using a [place] obturator.  Careful inspection of  the bladder showed no tumor or stones.  There was  minimal trabeculation.  The patient had lateral lobe hypertrophy as well  as median lobe hypertrophy and clear cut bladder outlet obstruction.  The ureters were normal and well back from the intended line of  resection.   Transurethral resection of the prostate was performed.  The median lobe  was carefully taken down beginning at the bladder neck and extending all  the way back to the verumontanum.  This dissection was taken down to the  surgical capsule.  As the capsule was approached there were numerous  calculi within the prostate that were released indicating that a good  plane between the adenoma and the true prostate had been obtained.  The  right lateral lobe was then resected beginning at the 11 o'clock  position and extending to the floor of the prostate.  The left side was  resected in identical fashion.  There was very little apical tissue and  minimal resection was required in that area.  The floor was resected a  little bit further in order to fully remove all the  calculi.  The  sphincter mechanism was not injured.  The veru was not injured and the  prostate was fully resected out to the surgical capsule.  Final  inspection showed the sphincter mechanism was unremarkable.  The bladder  neck had not been undermined.  All chips had been irrigated from the  bladder.  Adequate hemostasis had been obtained.  A rectal examination  showed there was little if any residual prostate tissue that required  resection.  The resectoscope was removed.  A 24 French Foley catheter  was inserted with a stylet guide.  The catheter was inflated and placed  to straight drainage.  This irrigated normally.  The patient tolerated  the procedure and was taken to the recovery room in good condition.  He  will be sent home on three way irrigation.      Jamison Neighbor, M.D.  Electronically Signed     RJE/MEDQ  D:  02/27/2007  T:  02/27/2007   Job:  [id number]

## 2010-08-10 NOTE — Cardiovascular Report (Signed)
NAME:  Daniel Everett, Daniel Everett NO.:  [id number]   MEDICAL RECORD NO.:  [id number]                   PATIENT TYPE:  OIB   LOCATION:  6501                                 FACILITY:  [facility]   PHYSICIAN:  Carole Binning, M.D. [facility]         DATE OF BIRTH:  Feb 07, 1932   DATE OF PROCEDURE:  11/23/2003  DATE OF DISCHARGE:  11/23/2003                              CARDIAC CATHETERIZATION   PROCEDURE PERFORMED:  Left heart catheterization with coronary angiography,  left ventriculography, and aortic root angiography.   INDICATION:  [name] is a 77 year old male who was noted to have PVCs on  his EKG.  He was referred for stress Cardiolite which was interpreted  showing mild inferior ischemia.  He has multiple cardiac risk factors and  was thus referred for cardiac catheterization.   CATHETERIZATION PROCEDURAL NOTE:  A 4 French sheath was placed in the right  femoral artery.  Coronary angiography was performed with 6 [place] JL-5 and  JR-4 catheters.  Left ventriculography and aortic root angiography were  performed with an angled pigtail catheter.  Contrast was Omnipaque.  There  were no complications.   CATHETERIZATION RESULTS:   HEMODYNAMICS:  1.  Left ventricular pressure 144/12.  2.  Aortic pressure 140/80.  3.  There is no aortic valve gradient.   LEFT VENTRICULOGRAM:  Wall motion is normal.  Ejection fraction is estimated  at 60%.  There is no mitral regurgitation.   Aortic root angiography reveals a tricuspid aortic valve with no aortic  insufficiency.  There is generalized aneurysmal dilatation of the ascending  thoracic aorta.   CORONARY ARTERIOGRAPHY (RIGHT DOMINANT):  Left main is normal.   Left anterior descending artery has a 70% stenosis in the proximal vessel,  40% in the mid vessel.  The distal LAD has a diffuse 20% stenosis.   Left circumflex gives rise to a large branching ramus intermedius and a  large branching obtuse marginal.  The left  circumflex has only minor luminal  irregularities.   Right coronary artery is a dominant vessel.  The right coronary artery gives  rise to a small posterior descending artery and three small posterior  lateral branches.  The right coronary artery is normal.   IMPRESSION:  1.  One-vessel coronary artery disease characterized by moderate disease in      the proximal left anterior descending artery of borderline significance.  2.  Normal left ventricular systolic function.  3.  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   PLAN:  Regarding the patient's coronary artery disease at this point as  there is no anterior ischemia on the Cardiolite, would recommend medical  therapy.  We will also proceed with a CT angio of the thoracic aorta to  better assess the size and rule out significant thoracic aortic aneurysm.  Carole Binning, M.D. [facility]    MWP/MEDQ  D:  11/23/2003  T:  11/23/2003  Job:  [id number]   cc:   Stacie Glaze, M.D. [facility]   Learta Codding, M.D. [facility]

## 2010-10-26 ENCOUNTER — Other Ambulatory Visit (INDEPENDENT_AMBULATORY_CARE_PROVIDER_SITE_OTHER): Payer: Medicare Other

## 2010-10-26 DIAGNOSIS — I10 Essential (primary) hypertension: Secondary | ICD-10-CM

## 2010-10-26 DIAGNOSIS — E039 Hypothyroidism, unspecified: Secondary | ICD-10-CM

## 2010-10-26 DIAGNOSIS — E785 Hyperlipidemia, unspecified: Secondary | ICD-10-CM

## 2010-10-26 LAB — LIPID PANEL
Cholesterol: 144 mg/dL (ref 0–200)
LDL Cholesterol: 87 mg/dL (ref 0–99)
Total CHOL/HDL Ratio: 3
Triglycerides: 56 mg/dL (ref 0.0–149.0)
VLDL: 11.2 mg/dL (ref 0.0–40.0)

## 2010-10-26 LAB — BASIC METABOLIC PANEL
BUN: 14 mg/dL (ref 6–23)
Calcium: 9.1 mg/dL (ref 8.4–10.5)
GFR: 79.74 mL/min (ref >60.00)
Glucose, Bld: 98 mg/dL (ref 70–99)

## 2010-11-02 ENCOUNTER — Encounter: Payer: Self-pay | Admitting: Internal Medicine

## 2010-11-02 ENCOUNTER — Ambulatory Visit (INDEPENDENT_AMBULATORY_CARE_PROVIDER_SITE_OTHER): Payer: Medicare Other | Admitting: Internal Medicine

## 2010-11-02 VITALS — BP 140/90 | HR 44 | Temp 98.2°F | Resp 16 | Ht 77.0 in | Wt 193.0 lb

## 2010-11-02 DIAGNOSIS — E785 Hyperlipidemia, unspecified: Secondary | ICD-10-CM

## 2010-11-02 DIAGNOSIS — Z8546 Personal history of malignant neoplasm of prostate: Secondary | ICD-10-CM

## 2010-11-02 DIAGNOSIS — E039 Hypothyroidism, unspecified: Secondary | ICD-10-CM

## 2010-11-02 DIAGNOSIS — M159 Polyosteoarthritis, unspecified: Secondary | ICD-10-CM

## 2010-11-02 DIAGNOSIS — I10 Essential (primary) hypertension: Secondary | ICD-10-CM

## 2010-11-02 DIAGNOSIS — M1710 Unilateral primary osteoarthritis, unspecified knee: Secondary | ICD-10-CM

## 2010-11-02 MED ORDER — METHYLPREDNISOLONE ACETATE 40 MG/ML IJ SUSP: 40.0000 mg | Freq: Once | INTRAMUSCULAR | Status: DC

## 2010-11-02 NOTE — Progress Notes (Signed)
Subjective:    Patient ID: Daniel Everett, male    DOB: [birth year], 77 y.o.   MRN: [id number]  HPI despondent over wife's ilness He presents today for followup of his hypertension hyperlipidemia and hypothyroidism.  He is on thyroid replacement he is on blood pressure medication and takes statin for lipid control.  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  HENT: Negative for hearing loss, congestion, neck pain and postnasal drip.   Eyes: Negative for discharge, redness and visual disturbance.  Respiratory: Negative for cough, shortness of breath and wheezing.   Cardiovascular: Negative for leg swelling.  Gastrointestinal: Negative for abdominal pain, constipation and abdominal distention.  Genitourinary: Negative for urgency and frequency.  Musculoskeletal: Negative for joint swelling and arthralgias.  Skin: Negative for color change and rash.  Neurological: Negative for weakness and light-headedness.  Hematological: Negative for adenopathy.  Psychiatric/Behavioral: Negative for behavioral problems.   Past Medical History  Diagnosis Date  . HYPOTHYROIDISM 11/27/2006  . Allergic rhinitis due to pollen 06/07/2008  . DIVERTICULOSIS, COLON 11/27/2006  . GEN OSTEOARTHROSIS INVOLVING MULTIPLE SITES 01/03/2010  . OSTEOARTHRITIS 11/27/2006  . WEIGHT LOSS, RECENT 11/27/2006  . HYPERGLYCEMIA 10/18/2008  . NEOPLASM, MALIGNANT, PROSTATE, HX OF, S/P TURP 05/28/2007  . COLONIC POLYPS, HX OF 10/28/2006   Past Surgical History    Procedure Date  . Rotator cuff repair   . Arthroscopy rt knee   . Hydrocele excision / repair     reports that he has never smoked. He does not have any smokeless tobacco history on file. He reports that he does not drink alcohol or use illicit drugs. family history includes Cancer in his father; Coronary artery disease in his mother; Heart disease in his mother; and Pancreatic cancer in his father. No Known Allergies     Objective:   Physical Exam  Nursing note and vitals reviewed. Constitutional: He is oriented to person, place, and time. He appears well-developed and well-nourished.  HENT:  Head: Normocephalic and atraumatic.  Eyes: Conjunctivae are normal. Pupils are equal, round, and reactive to light.  Neck: Normal range of motion. Neck supple.  Cardiovascular: Normal rate and regular rhythm.   Pulmonary/Chest: Effort normal and breath sounds normal.  Abdominal: Soft. Bowel sounds are normal.  Musculoskeletal: Normal range of motion.  Neurological: He is alert and oriented to person, place, and time.  Skin: Skin is warm and dry.          Assessment & Plan:  The patient has hypothyroidism stable on current medications TSH reviewed with patient.  Patient has hypertension stable on current medications blood pressures are call reviewed recent blood pressures at home blood pressures he is stable his current medication.  Patient has hyperlipidemia and is stable on the current medications administered he is at goal both in his LDL HDL and total cholesterol.  Patient has degenerative joint disease arthritis in multiple joints but worse in his knees bilaterally informed consent was obtained and his knees were  both injected with 40 mg of Depo-Medrol and 1 cc of lidocaine 1%.  Patient tolerated the procedure well post care instructions were given to the patient.

## 2010-11-02 NOTE — Patient Instructions (Signed)
Get Osteo Bi-Flex double strength  Twice a day

## 2010-12-31 ENCOUNTER — Ambulatory Visit (INDEPENDENT_AMBULATORY_CARE_PROVIDER_SITE_OTHER): Payer: Medicare Other | Admitting: Internal Medicine

## 2010-12-31 ENCOUNTER — Ambulatory Visit: Payer: Medicare Other | Admitting: Internal Medicine

## 2010-12-31 DIAGNOSIS — Z23 Encounter for immunization: Secondary | ICD-10-CM

## 2011-01-01 ENCOUNTER — Ambulatory Visit: Payer: Medicare Other | Admitting: Internal Medicine

## 2011-02-12 ENCOUNTER — Ambulatory Visit (INDEPENDENT_AMBULATORY_CARE_PROVIDER_SITE_OTHER): Payer: Medicare Other | Admitting: Internal Medicine

## 2011-02-12 ENCOUNTER — Encounter: Payer: Self-pay | Admitting: Internal Medicine

## 2011-02-12 VITALS — BP 142/80 | HR 46 | Temp 98.2°F | Resp 16 | Ht 77.0 in | Wt 194.0 lb

## 2011-02-12 DIAGNOSIS — E785 Hyperlipidemia, unspecified: Secondary | ICD-10-CM

## 2011-02-12 DIAGNOSIS — I10 Essential (primary) hypertension: Secondary | ICD-10-CM

## 2011-02-12 DIAGNOSIS — E039 Hypothyroidism, unspecified: Secondary | ICD-10-CM

## 2011-02-12 DIAGNOSIS — T887XXA Unspecified adverse effect of drug or medicament, initial encounter: Secondary | ICD-10-CM

## 2011-02-12 DIAGNOSIS — M159 Polyosteoarthritis, unspecified: Secondary | ICD-10-CM

## 2011-02-12 NOTE — Progress Notes (Signed)
Subjective:    Patient ID: Daniel Everett, male    DOB: 04/16/1931, 77 y.o.   MRN: [id number]  HPI Positional edema at end of day No SOB Knee pain and progressive arthritis Under increased stress caring for wife No signed of CHF , no chest pain or exertional SOB Still active working part time   Review of Systems  Constitutional: Negative for fever and fatigue.  HENT: Negative for hearing loss, congestion, neck pain and postnasal drip.   Eyes: Negative for discharge, redness and visual disturbance.  Respiratory: Negative for cough, shortness of breath and wheezing.   Cardiovascular: Negative for leg swelling.  Gastrointestinal: Negative for abdominal pain, constipation and abdominal distention.  Genitourinary: Negative for urgency and frequency.  Musculoskeletal: Positive for joint swelling. Negative for arthralgias.  Skin: Negative for color change and rash.  Neurological: Negative for weakness and light-headedness.  Hematological: Negative for adenopathy.  Psychiatric/Behavioral: Negative for behavioral problems.       Past Medical History  Diagnosis Date  . HYPOTHYROIDISM 11/27/2006  . Allergic rhinitis due to pollen 06/07/2008  . DIVERTICULOSIS, COLON 11/27/2006  . GEN OSTEOARTHROSIS INVOLVING MULTIPLE SITES 01/03/2010  . OSTEOARTHRITIS 11/27/2006  . WEIGHT LOSS, RECENT 11/27/2006  . HYPERGLYCEMIA 10/18/2008  . NEOPLASM, MALIGNANT, PROSTATE, HX OF, S/P TURP 05/28/2007  . COLONIC POLYPS, HX OF 10/28/2006    History   Social History  . Marital Status: Married    Spouse Name: N/A    Number of Children: N/A  . Years of Education: N/A   Occupational History  . retired    Social History Main Topics  . Smoking status: Never Smoker   . Smokeless tobacco: Not on file  . Alcohol Use: No  . Drug Use: No  . Sexually Active: Yes   Other Topics Concern  . Not on file   Social History Narrative  . No narrative on file    Past Surgical History  Procedure Date  . Rotator  cuff repair   . Arthroscopy rt knee   . Hydrocele excision / repair     Family History  Problem Relation Age of Onset  . Coronary artery disease Mother   . Heart disease Mother   . Pancreatic cancer Father   . Cancer Father     No Known Allergies  Current Outpatient Prescriptions on File Prior to Visit  Medication Sig Dispense Refill  . aspirin 81 MG tablet Take 81 mg by mouth daily.        . fish oil-omega-3 fatty acids 1000 MG capsule Take 2 g by mouth 2 (two) times daily.        Marland Kitchen glucosamine-chondroitin 500-400 MG tablet Take 2 tablets by mouth 2 (two) times daily.        . Multiple Vitamin (MULTIVITAMIN) tablet Take 1 tablet by mouth daily.        . Pyridoxine HCl (VITAMIN B-6) 100 MG tablet Take 100 mg by mouth daily.        . saw palmetto 160 MG capsule Take 160 mg by mouth daily.        . timolol (TIMOPTIC) 0.5 % ophthalmic solution Place 1 drop into both eyes 2 (two) times daily.        . travoprost, benzalkonium, (TRAVATAN) 0.004 % ophthalmic solution 1 drop at bedtime.        Marland Kitchen amLODipine (NORVASC) 5 MG tablet Take 1 tablet (5 mg total) by mouth daily.  90 tablet  3  . levothyroxine (SYNTHROID, LEVOTHROID) 50  MCG tablet Take 1 tablet (50 mcg total) by mouth daily.  90 tablet  3  . lisinopril (PRINIVIL,ZESTRIL) 20 MG tablet Take 1 tablet (20 mg total) by mouth daily.  90 tablet  3  . ranitidine (ZANTAC) 150 MG tablet Take 1 tablet (150 mg total) by mouth daily.  90 tablet  3  . rosuvastatin (CRESTOR) 10 MG tablet Take 1 tablet (10 mg total) by mouth daily.  90 tablet  3   No current facility-administered medications on file prior to visit.    BP 142/80  Pulse 46  Temp 98.2 F (36.8 C)  Resp 16  Ht 6\' 5"  (1.956 m)  Wt 194 lb (87.998 kg)  BMI 23.01 kg/m2    Objective:   Physical Exam  Nursing note and vitals reviewed. Constitutional: He appears well-developed and well-nourished.  HENT:  Head: Normocephalic and atraumatic.  Eyes: Conjunctivae are normal.  Pupils are equal, round, and reactive to light.  Neck: Normal range of motion. Neck supple.  Cardiovascular: Normal rate and regular rhythm.   Pulmonary/Chest: Effort normal and breath sounds normal.  Abdominal: Soft. Bowel sounds are normal.  Musculoskeletal: He exhibits edema and tenderness.          Assessment & Plan:  Mild positional edema probably related to the Norvasc that he uses for hypertension.  Well-controlled hypertension current medications monitor her thyroid to see if 50 mcg of Synthroid is adequate replacement continue using the lisinopril with amlodipine consider addition of a diuretic if the edema becomes problematic but discussed with the patient the use of support hose elevation of extremities salt modification as primary interventions.  His depression is stable although he is under increased stress dealing with a wife with Alzheimer's.  Arthritis is stable he takes glucosamine/chondroitin and rare use of nonsteroidals we did however mention that nonsteroidals can cause increase swelling of the extremities.

## 2011-02-12 NOTE — Patient Instructions (Signed)
The patient is instructed to continue all medications as prescribed. Schedule followup with check out clerk upon leaving the clinic  

## 2011-04-02 ENCOUNTER — Other Ambulatory Visit: Payer: Self-pay | Admitting: *Deleted

## 2011-04-02 MED ORDER — ROSUVASTATIN CALCIUM 10 MG PO TABS: 10.0000 mg | ORAL_TABLET | Freq: Every day | ORAL | Status: DC

## 2011-04-02 MED ORDER — LISINOPRIL 20 MG PO TABS: 20.0000 mg | ORAL_TABLET | Freq: Every day | ORAL | Status: DC

## 2011-04-02 MED ORDER — AMLODIPINE BESYLATE 5 MG PO TABS: 5.0000 mg | ORAL_TABLET | Freq: Every day | ORAL | Status: DC

## 2011-04-02 MED ORDER — LEVOTHYROXINE SODIUM 50 MCG PO TABS: 50.0000 ug | ORAL_TABLET | Freq: Every day | ORAL | Status: DC

## 2011-04-03 ENCOUNTER — Other Ambulatory Visit: Payer: Self-pay | Admitting: *Deleted

## 2011-04-03 MED ORDER — RANITIDINE HCL 150 MG PO TABS: 150.0000 mg | ORAL_TABLET | Freq: Every day | ORAL | Status: DC

## 2011-05-16 ENCOUNTER — Ambulatory Visit (INDEPENDENT_AMBULATORY_CARE_PROVIDER_SITE_OTHER): Payer: Medicare Other | Admitting: Internal Medicine

## 2011-05-16 ENCOUNTER — Encounter: Payer: Self-pay | Admitting: Internal Medicine

## 2011-05-16 VITALS — BP 136/80 | HR 56 | Temp 97.9°F | Resp 14 | Ht 77.0 in | Wt 192.0 lb

## 2011-05-16 DIAGNOSIS — E039 Hypothyroidism, unspecified: Secondary | ICD-10-CM

## 2011-05-16 DIAGNOSIS — I10 Essential (primary) hypertension: Secondary | ICD-10-CM

## 2011-05-16 DIAGNOSIS — M1990 Unspecified osteoarthritis, unspecified site: Secondary | ICD-10-CM

## 2011-05-16 DIAGNOSIS — T887XXA Unspecified adverse effect of drug or medicament, initial encounter: Secondary | ICD-10-CM

## 2011-05-16 DIAGNOSIS — Z8546 Personal history of malignant neoplasm of prostate: Secondary | ICD-10-CM

## 2011-05-16 DIAGNOSIS — N400 Enlarged prostate without lower urinary tract symptoms: Secondary | ICD-10-CM

## 2011-05-16 LAB — BASIC METABOLIC PANEL
CO2: 28 mEq/L (ref 19–32)
Calcium: 9.3 mg/dL (ref 8.4–10.5)
Creatinine, Ser: 0.9 mg/dL (ref 0.4–1.5)
GFR: 82.56 mL/min (ref >60.00)
Sodium: 138 mEq/L (ref 135–145)

## 2011-05-16 LAB — CBC WITH DIFFERENTIAL/PLATELET
Basophils Relative: 0.6 % (ref 0.0–3.0)
Eosinophils Absolute: 0.1 10*3/uL (ref 0.0–0.7)
Eosinophils Relative: 1.8 % (ref 0.0–5.0)
HCT: 44.2 % (ref 39.0–52.0)
Lymphs Abs: 1.7 10*3/uL (ref 0.7–4.0)
MCHC: 33.5 g/dL (ref 30.0–36.0)
MCV: 95.8 fl (ref 78.0–100.0)
Monocytes Absolute: 0.4 10*3/uL (ref 0.1–1.0)
Platelets: 168 10*3/uL (ref 150.0–400.0)
WBC: 4.9 10*3/uL (ref 4.5–10.5)

## 2011-05-16 LAB — PSA: PSA: 0.02 ng/mL — ABNORMAL LOW (ref 0.10–4.00)

## 2011-05-16 LAB — HEPATIC FUNCTION PANEL
Bilirubin, Direct: 0.2 mg/dL (ref 0.0–0.3)
Total Protein: 6.4 g/dL (ref 6.0–8.3)

## 2011-05-16 MED ORDER — METHYLPREDNISOLONE ACETATE PF 40 MG/ML IJ SUSP: 40.0000 mg | Freq: Once | INTRAMUSCULAR | Status: DC

## 2011-05-16 NOTE — Progress Notes (Signed)
Subjective:    Patient ID: Daniel Everett, male    DOB: [birth year], 78 y.o.   MRN: [id number]  HPI Increased stress Stable blood pressure He has been to the orthopedist for hip and knee pain He was given back , hip and knee exercizes Was explained to him that he has worn cartilage and that there was bone on bone contact but the orthopedist did not feel like knee replacement was an option at this time.  The patient has persistent pain in the knee and has range of motion limitations especially flexion of the knee.   Review of Systems  Constitutional: Negative for fever and fatigue.  HENT: Negative for hearing loss, congestion, neck pain and postnasal drip.   Eyes: Negative for discharge, redness and visual disturbance.  Respiratory: Negative for cough, shortness of breath and wheezing.   Cardiovascular: Negative for leg swelling.  Gastrointestinal: Negative for abdominal pain, constipation and abdominal distention.  Genitourinary: Negative for urgency and frequency.  Musculoskeletal: Negative for joint swelling and arthralgias.  Skin: Negative for color change and rash.  Neurological: Negative for weakness and light-headedness.  Hematological: Negative for adenopathy.  Psychiatric/Behavioral: Negative for behavioral problems.   Past Medical History  Diagnosis Date  . HYPOTHYROIDISM 11/27/2006  . Allergic rhinitis due to pollen 06/07/2008  . DIVERTICULOSIS, COLON 11/27/2006  . GEN OSTEOARTHROSIS INVOLVING MULTIPLE SITES 01/03/2010  . OSTEOARTHRITIS 11/27/2006  . WEIGHT LOSS, RECENT 11/27/2006  . HYPERGLYCEMIA 10/18/2008  . NEOPLASM, MALIGNANT, PROSTATE, HX OF, S/P TURP 05/28/2007  . COLONIC POLYPS, HX OF 10/28/2006    History   Social History  . Marital Status: Married    Spouse Name: N/A    Number of Children: N/A  . Years of Education: N/A   Occupational History  . retired    Social History Main Topics  . Smoking status: Never Smoker   . Smokeless tobacco: Not on file  .  Alcohol Use: No  . Drug Use: No  . Sexually Active: Yes   Other Topics Concern  . Not on file   Social History Narrative  . No narrative on file    Past Surgical History  Procedure Date  . Rotator cuff repair   . Arthroscopy rt knee   . Hydrocele excision / repair     Family History  Problem Relation Age of Onset  . Coronary artery disease Mother   . Heart disease Mother   . Pancreatic cancer Father   . Cancer Father     No Known Allergies  Current Outpatient Prescriptions on File Prior to Visit  Medication Sig Dispense Refill  . amLODipine (NORVASC) 5 MG tablet Take 1 tablet (5 mg total) by mouth daily.  90 tablet  3  . aspirin 81 MG tablet Take 81 mg by mouth daily.        . fish oil-omega-3 fatty acids 1000 MG capsule Take 2 g by mouth 2 (two) times daily.        Marland Kitchen glucosamine-chondroitin 500-400 MG tablet Take 2 tablets by mouth 2 (two) times daily.        Marland Kitchen levothyroxine (SYNTHROID, LEVOTHROID) 50 MCG tablet Take 1 tablet (50 mcg total) by mouth daily.  90 tablet  3  . lisinopril (PRINIVIL,ZESTRIL) 20 MG tablet Take 1 tablet (20 mg total) by mouth daily.  90 tablet  3  . Multiple Vitamin (MULTIVITAMIN) tablet Take 1 tablet by mouth daily.        . Pyridoxine HCl (VITAMIN B-6) 100 MG tablet  Take 100 mg by mouth daily.        . ranitidine (ZANTAC) 150 MG tablet Take 1 tablet (150 mg total) by mouth daily.  90 tablet  3  . rosuvastatin (CRESTOR) 10 MG tablet Take 1 tablet (10 mg total) by mouth daily.  90 tablet  3  . saw palmetto 160 MG capsule Take 160 mg by mouth daily.        . timolol (TIMOPTIC) 0.5 % ophthalmic solution Place 1 drop into both eyes 2 (two) times daily.        . travoprost, benzalkonium, (TRAVATAN) 0.004 % ophthalmic solution 1 drop at bedtime.         Current Facility-Administered Medications on File Prior to Visit  Medication Dose Route Frequency Provider Last Rate Last Dose  . DISCONTD: methylPREDNISolone acetate (DEPO-MEDROL) injection 40 mg   40 mg Intra-articular Once Carrie Mew, MD      . DISCONTD: methylPREDNISolone acetate (DEPO-MEDROL) injection 40 mg  40 mg Intra-articular Once Carrie Mew, MD        BP 136/80  Pulse 56  Temp 97.9 F (36.6 C)  Resp 14  Ht 6\' 5"  (1.956 m)  Wt 192 lb (87.091 kg)  BMI 22.77 kg/m2        Objective:   Physical Exam  Nursing note and vitals reviewed. Constitutional: He appears well-developed and well-nourished.  HENT:  Head: Normocephalic and atraumatic.  Eyes: Conjunctivae are normal. Pupils are equal, round, and reactive to light.  Neck: Normal range of motion. Neck supple.  Cardiovascular: Normal rate and regular rhythm.   Pulmonary/Chest: Effort normal and breath sounds normal.  Abdominal: Soft. Bowel sounds are normal.          Assessment & Plan:  Increased stress due to wife's dementia  Monitoring of cholesterol and thyroid will be accomplished today he will continue his medications and we will report back to him the results of the blood work.  His blood pressure appears stable on his current medications.  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  We will perform an injection with corticosteroid today as both a pain reliever and a diagnostic procedure   Informed consent obtained and the patient's right  knee was prepped with betadine. Local anesthesia was obtained with topical spray. Then 40 mg of Depo-Medrol and 1/2 cc of lidocaine was injected into the joint space. The patient tolerated the procedure without complications. Post injection care discussed with patient.

## 2011-05-16 NOTE — Patient Instructions (Addendum)
The patient is instructed to continue all medications as prescribed. Schedule followup with check out clerk upon leaving the clinic The knee pain comes form tears in the cartilage in the knee... Allowing bone to touch bone sand cause pain

## 2011-09-27 ENCOUNTER — Telehealth: Payer: Self-pay | Admitting: Family Medicine

## 2011-09-27 NOTE — Telephone Encounter (Signed)
Home care called. Requesting a psych referral for Daniel Everett in terms of situation with his wife. Wondering if someone could come out to the home and talk with him, feel out the situation. Also requesting a social work eval to see what other services might be available to him and his wife's situation. Please call or send referral to (fax) [phone]. Also see note in spouse's chart.

## 2011-10-01 NOTE — Telephone Encounter (Signed)
Talked with husband , this was not something he agreed with- instructed to call us and talk with [place] if he feels it is needed

## 2011-11-13 ENCOUNTER — Encounter: Payer: Self-pay | Admitting: Internal Medicine

## 2011-11-13 ENCOUNTER — Ambulatory Visit (INDEPENDENT_AMBULATORY_CARE_PROVIDER_SITE_OTHER): Payer: Medicare Other | Admitting: Internal Medicine

## 2011-11-13 ENCOUNTER — Ambulatory Visit: Payer: Medicare Other | Admitting: Internal Medicine

## 2011-11-13 VITALS — BP 126/80 | HR 60 | Temp 98.0°F | Resp 16 | Ht 77.0 in | Wt 184.0 lb

## 2011-11-13 DIAGNOSIS — R634 Abnormal weight loss: Secondary | ICD-10-CM

## 2011-11-13 DIAGNOSIS — E039 Hypothyroidism, unspecified: Secondary | ICD-10-CM

## 2011-11-13 DIAGNOSIS — I10 Essential (primary) hypertension: Secondary | ICD-10-CM

## 2011-11-13 LAB — BASIC METABOLIC PANEL
Calcium: 9.2 mg/dL (ref 8.4–10.5)
Creatinine, Ser: 0.9 mg/dL (ref 0.4–1.5)
GFR: 92.61 mL/min (ref >60.00)

## 2011-11-13 LAB — CBC WITH DIFFERENTIAL/PLATELET
Basophils Absolute: 0 10*3/uL (ref 0.0–0.1)
HCT: 42.8 % (ref 39.0–52.0)
Lymphs Abs: 1.4 10*3/uL (ref 0.7–4.0)
MCV: 95.4 fl (ref 78.0–100.0)
Monocytes Absolute: 0.3 10*3/uL (ref 0.1–1.0)
Neutrophils Relative %: 54.1 % (ref 43.0–77.0)
Platelets: 158 10*3/uL (ref 150.0–400.0)
RDW: 14.3 % (ref 11.5–14.6)

## 2011-11-13 LAB — TSH: TSH: 1.68 u[IU]/mL (ref 0.35–5.50)

## 2011-11-13 NOTE — Progress Notes (Signed)
Subjective:    Patient ID: Daniel Everett, male    DOB: 1931-08-06, 78 y.o.   MRN: [id number]  HPI Review of thyroid replacement? Has lost weight? Mood depression stress all play a role Missed meals due to wife's ilness Lost 8 lbs since last visit and 15 from the beginning of the year    Review of Systems  Constitutional: Positive for activity change, appetite change and unexpected weight change. Negative for fever and fatigue.  HENT: Negative for hearing loss, congestion, neck pain and postnasal drip.   Eyes: Negative for discharge, redness and visual disturbance.  Respiratory: Negative for cough, shortness of breath and wheezing.   Cardiovascular: Negative for leg swelling.  Gastrointestinal: Negative for abdominal pain, constipation and abdominal distention.  Genitourinary: Negative for urgency and frequency.  Musculoskeletal: Negative for joint swelling and arthralgias.  Skin: Negative for color change and rash.  Neurological: Negative for weakness and light-headedness.  Hematological: Negative for adenopathy.  Psychiatric/Behavioral: Negative for behavioral problems.   Past Medical History  Diagnosis Date  . HYPOTHYROIDISM 11/27/2006  . Allergic rhinitis due to pollen 06/07/2008  . DIVERTICULOSIS, COLON 11/27/2006  . GEN OSTEOARTHROSIS INVOLVING MULTIPLE SITES 01/03/2010  . OSTEOARTHRITIS 11/27/2006  . WEIGHT LOSS, RECENT 11/27/2006  . HYPERGLYCEMIA 10/18/2008  . NEOPLASM, MALIGNANT, PROSTATE, HX OF, S/P TURP 05/28/2007  . COLONIC POLYPS, HX OF 10/28/2006    History   Social History  . Marital Status: Married    Spouse Name: N/A    Number of Children: N/A  . Years of Education: N/A   Occupational History  . retired    Social History Main Topics  . Smoking status: Never Smoker   . Smokeless tobacco: Not on file  . Alcohol Use: No  . Drug Use: No  . Sexually Active: Yes   Other Topics Concern  . Not on file   Social History Narrative  . No narrative on file     Past Surgical History  Procedure Date  . Rotator cuff repair   . Arthroscopy rt knee   . Hydrocele excision / repair     Family History  Problem Relation Age of Onset  . Coronary artery disease Mother   . Heart disease Mother   . Pancreatic cancer Father   . Cancer Father     No Known Allergies  Current Outpatient Prescriptions on File Prior to Visit  Medication Sig Dispense Refill  . amLODipine (NORVASC) 5 MG tablet Take 1 tablet (5 mg total) by mouth daily.  90 tablet  3  . aspirin 81 MG tablet Take 81 mg by mouth daily.        . fish oil-omega-3 fatty acids 1000 MG capsule Take 2 g by mouth 2 (two) times daily.        Marland Kitchen glucosamine-chondroitin 500-400 MG tablet Take 2 tablets by mouth 2 (two) times daily.        Marland Kitchen levothyroxine (SYNTHROID, LEVOTHROID) 50 MCG tablet Take 1 tablet (50 mcg total) by mouth daily.  90 tablet  3  . lisinopril (PRINIVIL,ZESTRIL) 20 MG tablet Take 1 tablet (20 mg total) by mouth daily.  90 tablet  3  . Multiple Vitamin (MULTIVITAMIN) tablet Take 1 tablet by mouth daily.        . Pyridoxine HCl (VITAMIN B-6) 100 MG tablet Take 100 mg by mouth daily.        . ranitidine (ZANTAC) 150 MG tablet Take 1 tablet (150 mg total) by mouth daily.  90 tablet  3  . rosuvastatin (CRESTOR) 10 MG tablet Take 1 tablet (10 mg total) by mouth daily.  90 tablet  3  . saw palmetto 160 MG capsule Take 160 mg by mouth daily.        . timolol (TIMOPTIC) 0.5 % ophthalmic solution Place 1 drop into both eyes 2 (two) times daily.         Current Facility-Administered Medications on File Prior to Visit  Medication Dose Route Frequency Provider Last Rate Last Dose  . methylPREDNISolone acetate PF (DEPO-MEDROL) injection 40 mg  40 mg Intra-articular Once Stacie Glaze, MD        BP 126/80  Pulse 60  Temp 98 F (36.7 C)  Resp 16  Ht 6\' 5"  (1.956 m)  Wt 184 lb (83.462 kg)  BMI 21.82 kg/m2       Objective:   Physical Exam  Nursing note and vitals  reviewed. Constitutional: He appears well-developed and well-nourished.  HENT:  Head: Normocephalic and atraumatic.  Eyes: Conjunctivae are normal. Pupils are equal, round, and reactive to light.  Neck: Normal range of motion. Neck supple.  Cardiovascular: Normal rate and regular rhythm.   Pulmonary/Chest: Effort normal and breath sounds normal.  Abdominal: Soft. Bowel sounds are normal.  Musculoskeletal: Normal range of motion.          Assessment & Plan:  Weight loss from stress Reviewed diet and need to take supplements HTN stable reviewed blood work from last visit Monitor thyroid

## 2011-11-13 NOTE — Patient Instructions (Addendum)
The patient is instructed to continue all medications as prescribed. Schedule followup with check out clerk upon leaving the clinic  set up the respite care... let Daniel Everett no and we will give an order for physical therapy

## 2011-12-17 ENCOUNTER — Other Ambulatory Visit: Payer: Self-pay | Admitting: *Deleted

## 2011-12-17 ENCOUNTER — Ambulatory Visit (INDEPENDENT_AMBULATORY_CARE_PROVIDER_SITE_OTHER): Payer: Medicare Other

## 2011-12-17 DIAGNOSIS — Z23 Encounter for immunization: Secondary | ICD-10-CM

## 2011-12-26 ENCOUNTER — Other Ambulatory Visit: Payer: Self-pay | Admitting: *Deleted

## 2011-12-26 MED ORDER — AMLODIPINE BESYLATE 5 MG PO TABS: 5.0000 mg | ORAL_TABLET | Freq: Every day | ORAL | Status: DC

## 2011-12-26 MED ORDER — LEVOTHYROXINE SODIUM 50 MCG PO TABS: 50.0000 ug | ORAL_TABLET | Freq: Every day | ORAL | Status: DC

## 2011-12-26 MED ORDER — LISINOPRIL 20 MG PO TABS: 20.0000 mg | ORAL_TABLET | Freq: Every day | ORAL | Status: DC

## 2011-12-26 MED ORDER — RANITIDINE HCL 150 MG PO TABS: 150.0000 mg | ORAL_TABLET | Freq: Every day | ORAL | Status: DC

## 2012-01-06 ENCOUNTER — Ambulatory Visit: Payer: Medicare Other | Admitting: Internal Medicine

## 2012-03-06 ENCOUNTER — Ambulatory Visit (INDEPENDENT_AMBULATORY_CARE_PROVIDER_SITE_OTHER): Payer: Medicare Other | Admitting: Internal Medicine

## 2012-03-06 ENCOUNTER — Encounter: Payer: Self-pay | Admitting: Internal Medicine

## 2012-03-06 VITALS — BP 120/78 | HR 58 | Temp 98.3°F | Resp 16 | Ht 77.0 in | Wt 184.0 lb

## 2012-03-06 DIAGNOSIS — E785 Hyperlipidemia, unspecified: Secondary | ICD-10-CM

## 2012-03-06 DIAGNOSIS — I10 Essential (primary) hypertension: Secondary | ICD-10-CM

## 2012-03-06 DIAGNOSIS — F439 Reaction to severe stress, unspecified: Secondary | ICD-10-CM

## 2012-03-06 DIAGNOSIS — Z733 Stress, not elsewhere classified: Secondary | ICD-10-CM

## 2012-03-06 MED ORDER — ATORVASTATIN CALCIUM 40 MG PO TABS: 20.0000 mg | ORAL_TABLET | Freq: Every day | ORAL | Status: DC

## 2012-03-06 NOTE — Patient Instructions (Addendum)
The patient is instructed to continue all medications as prescribed. Schedule followup with check out clerk upon leaving the clinic  

## 2012-03-06 NOTE — Progress Notes (Signed)
Subjective:    Patient ID: Daniel Everett, male    DOB: 1932-01-17, 78 y.o.   MRN: [id number]  HPI  Blood pressure and weight  Monitoring Wife has progressive dementia inappropriate behavior monitoring for anemia and hypothyroid replacement    Review of Systems  Constitutional: Negative for fever and fatigue.  HENT: Negative for hearing loss, congestion, neck pain and postnasal drip.   Eyes: Negative for discharge, redness and visual disturbance.  Respiratory: Negative for cough, shortness of breath and wheezing.   Cardiovascular: Negative for leg swelling.  Gastrointestinal: Negative for abdominal pain, constipation and abdominal distention.  Genitourinary: Negative for urgency and frequency.  Musculoskeletal: Negative for joint swelling and arthralgias.  Skin: Negative for color change and rash.  Neurological: Negative for weakness and light-headedness.  Hematological: Negative for adenopathy.  Psychiatric/Behavioral: Negative for behavioral problems.   Past Medical History  Diagnosis Date  . HYPOTHYROIDISM 11/27/2006  . Allergic rhinitis due to pollen 06/07/2008  . DIVERTICULOSIS, COLON 11/27/2006  . GEN OSTEOARTHROSIS INVOLVING MULTIPLE SITES 01/03/2010  . OSTEOARTHRITIS 11/27/2006  . WEIGHT LOSS, RECENT 11/27/2006  . HYPERGLYCEMIA 10/18/2008  . NEOPLASM, MALIGNANT, PROSTATE, HX OF, S/P TURP 05/28/2007  . COLONIC POLYPS, HX OF 10/28/2006    History   Social History  . Marital Status: Married    Spouse Name: N/A    Number of Children: N/A  . Years of Education: N/A   Occupational History  . retired    Social History Main Topics  . Smoking status: Never Smoker   . Smokeless tobacco: Not on file  . Alcohol Use: No  . Drug Use: No  . Sexually Active: Yes   Other Topics Concern  . Not on file   Social History Narrative  . No narrative on file    Past Surgical History  Procedure Date  . Rotator cuff repair   . Arthroscopy rt knee   . Hydrocele excision / repair      Family History  Problem Relation Age of Onset  . Coronary artery disease Mother   . Heart disease Mother   . Pancreatic cancer Father   . Cancer Father     No Known Allergies  Current Outpatient Prescriptions on File Prior to Visit  Medication Sig Dispense Refill  . amLODipine (NORVASC) 5 MG tablet Take 1 tablet (5 mg total) by mouth daily.  90 tablet  3  . aspirin 81 MG tablet Take 81 mg by mouth daily.        Marland Kitchen glucosamine-chondroitin 500-400 MG tablet Take 2 tablets by mouth 2 (two) times daily.        Marland Kitchen latanoprost (XALATAN) 0.005 % ophthalmic solution 1 drop at bedtime.      Marland Kitchen levothyroxine (SYNTHROID, LEVOTHROID) 50 MCG tablet Take 1 tablet (50 mcg total) by mouth daily.  90 tablet  3  . lisinopril (PRINIVIL,ZESTRIL) 20 MG tablet Take 1 tablet (20 mg total) by mouth daily.  90 tablet  3  . Multiple Vitamin (MULTIVITAMIN) tablet Take 1 tablet by mouth daily.        . Pyridoxine HCl (VITAMIN B-6) 100 MG tablet Take 100 mg by mouth daily.        . ranitidine (ZANTAC) 150 MG tablet Take 1 tablet (150 mg total) by mouth daily.  90 tablet  3  . rosuvastatin (CRESTOR) 10 MG tablet Take 1 tablet (10 mg total) by mouth daily.  90 tablet  3  . saw palmetto 160 MG capsule Take 160 mg by  mouth daily.        . timolol (TIMOPTIC) 0.5 % ophthalmic solution Place 1 drop into both eyes 2 (two) times daily.         Current Facility-Administered Medications on File Prior to Visit  Medication Dose Route Frequency Provider Last Rate Last Dose  . methylPREDNISolone acetate PF (DEPO-MEDROL) injection 40 mg  40 mg Intra-articular Once Stacie Glaze, MD        BP 120/78  Pulse 58  Temp 98.3 F (36.8 C)  Resp 16  Ht 6\' 5"  (1.956 m)  Wt 184 lb (83.462 kg)  BMI 21.82 kg/m2       Objective:   Physical Exam  Nursing note and vitals reviewed. Constitutional: He appears well-developed and well-nourished.  HENT:  Head: Normocephalic and atraumatic.  Eyes: Conjunctivae normal are normal.  Pupils are equal, round, and reactive to light.  Neck: Normal range of motion. Neck supple.  Cardiovascular: Normal rate and regular rhythm.   Murmur heard. Pulmonary/Chest: Effort normal and breath sounds normal.  Abdominal: Soft. Bowel sounds are normal.          Assessment & Plan:   Discussed stress Blood pressure stable Monitoring from august stable  Renal, TSH and CBC [facility] changed the crestor to zocar

## 2012-03-12 ENCOUNTER — Ambulatory Visit: Payer: Medicare Other | Admitting: Internal Medicine

## 2012-03-23 ENCOUNTER — Other Ambulatory Visit: Payer: Self-pay | Admitting: *Deleted

## 2012-03-23 DIAGNOSIS — F439 Reaction to severe stress, unspecified: Secondary | ICD-10-CM

## 2012-03-23 DIAGNOSIS — E785 Hyperlipidemia, unspecified: Secondary | ICD-10-CM

## 2012-03-23 MED ORDER — LEVOTHYROXINE SODIUM 50 MCG PO TABS: 50.0000 ug | ORAL_TABLET | Freq: Every day | ORAL | Status: DC

## 2012-03-23 MED ORDER — RANITIDINE HCL 150 MG PO TABS: 150.0000 mg | ORAL_TABLET | Freq: Every day | ORAL | Status: DC

## 2012-03-23 MED ORDER — LISINOPRIL 20 MG PO TABS: 20.0000 mg | ORAL_TABLET | Freq: Every day | ORAL | Status: DC

## 2012-03-23 MED ORDER — AMLODIPINE BESYLATE 5 MG PO TABS: 5.0000 mg | ORAL_TABLET | Freq: Every day | ORAL | Status: DC

## 2012-03-23 MED ORDER — ATORVASTATIN CALCIUM 40 MG PO TABS: 20.0000 mg | ORAL_TABLET | Freq: Every day | ORAL | Status: DC

## 2012-06-18 ENCOUNTER — Other Ambulatory Visit (INDEPENDENT_AMBULATORY_CARE_PROVIDER_SITE_OTHER): Payer: Medicare Other

## 2012-06-18 DIAGNOSIS — E785 Hyperlipidemia, unspecified: Secondary | ICD-10-CM

## 2012-06-18 DIAGNOSIS — I10 Essential (primary) hypertension: Secondary | ICD-10-CM

## 2012-06-18 LAB — LIPID PANEL
Cholesterol: 133 mg/dL (ref 0–200)
HDL: 39.9 mg/dL (ref >39.00)
Total CHOL/HDL Ratio: 3
Triglycerides: 47 mg/dL (ref 0.0–149.0)

## 2012-06-18 LAB — BASIC METABOLIC PANEL
Chloride: 104 mEq/L (ref 96–112)
Creatinine, Ser: 0.9 mg/dL (ref 0.4–1.5)
GFR: 85.47 mL/min (ref >60.00)

## 2012-06-18 LAB — HEPATIC FUNCTION PANEL
ALT: 20 U/L (ref 0–53)
AST: 27 U/L (ref 0–37)
Albumin: 3.6 g/dL (ref 3.5–5.2)
Alkaline Phosphatase: 39 U/L (ref 39–117)
Total Protein: 6.4 g/dL (ref 6.0–8.3)

## 2012-06-24 ENCOUNTER — Encounter: Payer: Self-pay | Admitting: Internal Medicine

## 2012-06-24 ENCOUNTER — Ambulatory Visit (INDEPENDENT_AMBULATORY_CARE_PROVIDER_SITE_OTHER): Payer: Medicare Other | Admitting: Internal Medicine

## 2012-06-24 VITALS — BP 124/78 | HR 56 | Temp 98.2°F | Resp 16 | Ht 77.0 in | Wt 194.0 lb

## 2012-06-24 DIAGNOSIS — R001 Bradycardia, unspecified: Secondary | ICD-10-CM

## 2012-06-24 DIAGNOSIS — I498 Other specified cardiac arrhythmias: Secondary | ICD-10-CM

## 2012-06-24 DIAGNOSIS — I10 Essential (primary) hypertension: Secondary | ICD-10-CM

## 2012-06-24 DIAGNOSIS — Z23 Encounter for immunization: Secondary | ICD-10-CM

## 2012-06-24 DIAGNOSIS — E785 Hyperlipidemia, unspecified: Secondary | ICD-10-CM

## 2012-06-24 MED ORDER — TETANUS-DIPHTHERIA TOXOIDS TD 2-2 LF/0.5ML IM SUSP: 0.5000 mL | Freq: Once | INTRAMUSCULAR | Status: DC

## 2012-06-24 MED ORDER — AMLODIPINE BESYLATE 5 MG PO TABS: 2.5000 mg | ORAL_TABLET | Freq: Every day | ORAL | Status: DC

## 2012-06-24 NOTE — Progress Notes (Signed)
Subjective:    Patient ID: Daniel Everett, male    DOB: November 01, 1931, 79 y.o.   MRN: [id number]  HPI Monitoring for lipids, thyroid and renal function due to HTN history and use of statin Recent change to generic Lipitor noted Mood low due to wife's issues Mild gate issues with balance and steadiness Possible orthostatic changes Nocturia x 2-3     Review of Systems  Constitutional: Positive for activity change. Negative for fever and fatigue.  HENT: Negative for hearing loss, congestion, neck pain and postnasal drip.   Eyes: Negative for discharge, redness and visual disturbance.  Respiratory: Negative for cough, shortness of breath and wheezing.   Cardiovascular: Negative for leg swelling.  Gastrointestinal: Negative for abdominal pain, constipation and abdominal distention.  Endocrine:       Nocutria  Genitourinary: Positive for urgency and frequency.  Musculoskeletal: Negative for joint swelling and arthralgias.  Skin: Negative for color change and rash.  Neurological: Negative for weakness and light-headedness.  Hematological: Negative for adenopathy.  Psychiatric/Behavioral: Negative for behavioral problems.   Past Medical History  Diagnosis Date  . HYPOTHYROIDISM 11/27/2006  . Allergic rhinitis due to pollen 06/07/2008  . DIVERTICULOSIS, COLON 11/27/2006  . GEN OSTEOARTHROSIS INVOLVING MULTIPLE SITES 01/03/2010  . OSTEOARTHRITIS 11/27/2006  . WEIGHT LOSS, RECENT 11/27/2006  . HYPERGLYCEMIA 10/18/2008  . NEOPLASM, MALIGNANT, PROSTATE, HX OF, S/P TURP 05/28/2007  . COLONIC POLYPS, HX OF 10/28/2006    History   Social History  . Marital Status: Married    Spouse Name: N/A    Number of Children: N/A  . Years of Education: N/A   Occupational History  . retired    Social History Main Topics  . Smoking status: Never Smoker   . Smokeless tobacco: Not on file  . Alcohol Use: No  . Drug Use: No  . Sexually Active: Yes   Other Topics Concern  . Not on file   Social  History Narrative  . No narrative on file    Past Surgical History  Procedure Laterality Date  . Rotator cuff repair    . Arthroscopy rt knee    . Hydrocele excision / repair      Family History  Problem Relation Age of Onset  . Coronary artery disease Mother   . Heart disease Mother   . Pancreatic cancer Father   . Cancer Father     No Known Allergies  Current Outpatient Prescriptions on File Prior to Visit  Medication Sig Dispense Refill  . amLODipine (NORVASC) 5 MG tablet Take 1 tablet (5 mg total) by mouth daily.  90 tablet  3  . aspirin 81 MG tablet Take 81 mg by mouth daily.        Marland Kitchen atorvastatin (LIPITOR) 40 MG tablet Take 0.5 tablets (20 mg total) by mouth daily.  90 tablet  3  . glucosamine-chondroitin 500-400 MG tablet Take 2 tablets by mouth 2 (two) times daily.        Marland Kitchen latanoprost (XALATAN) 0.005 % ophthalmic solution 1 drop at bedtime.      Marland Kitchen levothyroxine (SYNTHROID, LEVOTHROID) 50 MCG tablet Take 1 tablet (50 mcg total) by mouth daily.  90 tablet  3  . lisinopril (PRINIVIL,ZESTRIL) 20 MG tablet Take 1 tablet (20 mg total) by mouth daily.  90 tablet  3  . Multiple Vitamin (MULTIVITAMIN) tablet Take 1 tablet by mouth daily.        . Pyridoxine HCl (VITAMIN B-6) 100 MG tablet Take 100 mg by mouth  daily.        . ranitidine (ZANTAC) 150 MG tablet Take 1 tablet (150 mg total) by mouth daily.  90 tablet  3  . saw palmetto 160 MG capsule Take 160 mg by mouth daily.        . timolol (TIMOPTIC) 0.5 % ophthalmic solution Place 1 drop into both eyes 2 (two) times daily.         Current Facility-Administered Medications on File Prior to Visit  Medication Dose Route Frequency Provider Last Rate Last Dose  . methylPREDNISolone acetate PF (DEPO-MEDROL) injection 40 mg  40 mg Intra-articular Once Stacie Glaze, MD        BP 124/78  Pulse 56  Temp(Src) 98.2 F (36.8 C)  Resp 16  Ht 6\' 5"  (1.956 m)  Wt 194 lb (87.998 kg)  BMI 23 kg/m2       Objective:   Physical  Exam  Constitutional: He appears well-developed and well-nourished.  HENT:  Head: Normocephalic and atraumatic.  Eyes: Conjunctivae are normal. Pupils are equal, round, and reactive to light.  Neck: Normal range of motion. Neck supple.  Cardiovascular: Normal rate and regular rhythm.   Murmur heard. Pulmonary/Chest: Effort normal and breath sounds normal.  Abdominal: Soft. Bowel sounds are normal.          Assessment & Plan:  Using timed awakening for voiding with wife and this interferes with sleep OA and loss of grip strenght Mild orthostatics due to bradycardia ( timoptic) TSH from [place] was 2.55 Possible symtomatic bradycardia and if symptoms worsen an event moniter would be the nest step Reduce the norvasc to 2.5

## 2012-06-24 NOTE — Patient Instructions (Signed)
The patient is instructed to continue all medications as prescribed. Schedule followup with check out clerk upon leaving the clinic  

## 2012-06-24 NOTE — Addendum Note (Signed)
Addended by: Willy Eddy on: 06/24/2012 09:55 AM   Modules accepted: Orders

## 2012-08-19 ENCOUNTER — Other Ambulatory Visit: Payer: Self-pay | Admitting: Dermatology

## 2012-10-26 ENCOUNTER — Ambulatory Visit (INDEPENDENT_AMBULATORY_CARE_PROVIDER_SITE_OTHER): Payer: Medicare Other | Admitting: Internal Medicine

## 2012-10-26 ENCOUNTER — Encounter: Payer: Self-pay | Admitting: Internal Medicine

## 2012-10-26 VITALS — BP 140/84 | HR 64 | Temp 98.3°F | Resp 16 | Ht 77.0 in | Wt 184.0 lb

## 2012-10-26 DIAGNOSIS — N401 Enlarged prostate with lower urinary tract symptoms: Secondary | ICD-10-CM

## 2012-10-26 DIAGNOSIS — M1990 Unspecified osteoarthritis, unspecified site: Secondary | ICD-10-CM

## 2012-10-26 DIAGNOSIS — N138 Other obstructive and reflux uropathy: Secondary | ICD-10-CM

## 2012-10-26 DIAGNOSIS — I10 Essential (primary) hypertension: Secondary | ICD-10-CM

## 2012-10-26 DIAGNOSIS — E039 Hypothyroidism, unspecified: Secondary | ICD-10-CM

## 2012-10-26 LAB — BASIC METABOLIC PANEL
CO2: 29 mEq/L (ref 19–32)
Calcium: 9.3 mg/dL (ref 8.4–10.5)
GFR: 81.25 mL/min (ref >60.00)
Sodium: 141 mEq/L (ref 135–145)

## 2012-10-26 LAB — TSH: TSH: 1.57 u[IU]/mL (ref 0.35–5.50)

## 2012-10-26 MED ORDER — FINASTERIDE 5 MG PO TABS: 5.0000 mg | ORAL_TABLET | Freq: Every day | ORAL | Status: DC

## 2012-10-26 NOTE — Progress Notes (Signed)
Subjective:    Patient ID: Daniel Everett, male    DOB: 12-15-1931, 79 y.o.   MRN: [id number]  HPI Increased stressors and lack of exercise due to family stressors Both wife and brother in nursing home HTN and hypothyroid stable Brother in hospice   Review of Systems  Constitutional: Positive for fatigue. Negative for fever.  HENT: Negative for hearing loss, congestion, neck pain and postnasal drip.   Eyes: Negative for discharge, redness and visual disturbance.  Respiratory: Negative for cough, shortness of breath and wheezing.   Cardiovascular: Positive for leg swelling.  Gastrointestinal: Negative for abdominal pain, constipation and abdominal distention.  Genitourinary: Negative for urgency and frequency.  Musculoskeletal: Negative for joint swelling and arthralgias.  Skin: Negative for color change and rash.  Neurological: Negative for weakness and light-headedness.  Hematological: Negative for adenopathy.  Psychiatric/Behavioral: Negative for behavioral problems.   Past Medical History  Diagnosis Date  . HYPOTHYROIDISM 11/27/2006  . Allergic rhinitis due to pollen 06/07/2008  . DIVERTICULOSIS, COLON 11/27/2006  . GEN OSTEOARTHROSIS INVOLVING MULTIPLE SITES 01/03/2010  . OSTEOARTHRITIS 11/27/2006  . WEIGHT LOSS, RECENT 11/27/2006  . HYPERGLYCEMIA 10/18/2008  . NEOPLASM, MALIGNANT, PROSTATE, HX OF, S/P TURP 05/28/2007  . COLONIC POLYPS, HX OF 10/28/2006    History   Social History  . Marital Status: Married    Spouse Name: N/A    Number of Children: N/A  . Years of Education: N/A   Occupational History  . retired    Social History Main Topics  . Smoking status: Never Smoker   . Smokeless tobacco: Not on file  . Alcohol Use: No  . Drug Use: No  . Sexually Active: Yes   Other Topics Concern  . Not on file   Social History Narrative  . No narrative on file    Past Surgical History  Procedure Laterality Date  . Rotator cuff repair    . Arthroscopy rt knee    .  Hydrocele excision / repair      Family History  Problem Relation Age of Onset  . Coronary artery disease Mother   . Heart disease Mother   . Pancreatic cancer Father   . Cancer Father     No Known Allergies  Current Outpatient Prescriptions on File Prior to Visit  Medication Sig Dispense Refill  . amLODipine (NORVASC) 5 MG tablet Take 0.5 tablets (2.5 mg total) by mouth daily.  90 tablet  3  . aspirin 81 MG tablet Take 81 mg by mouth daily.        Marland Kitchen atorvastatin (LIPITOR) 40 MG tablet Take 0.5 tablets (20 mg total) by mouth daily.  90 tablet  3  . glucosamine-chondroitin 500-400 MG tablet Take 2 tablets by mouth 2 (two) times daily.        Marland Kitchen latanoprost (XALATAN) 0.005 % ophthalmic solution 1 drop at bedtime.      Marland Kitchen levothyroxine (SYNTHROID, LEVOTHROID) 50 MCG tablet Take 1 tablet (50 mcg total) by mouth daily.  90 tablet  3  . lisinopril (PRINIVIL,ZESTRIL) 20 MG tablet Take 1 tablet (20 mg total) by mouth daily.  90 tablet  3  . Multiple Vitamin (MULTIVITAMIN) tablet Take 1 tablet by mouth daily.        . Pyridoxine HCl (VITAMIN B-6) 100 MG tablet Take 100 mg by mouth daily.        . ranitidine (ZANTAC) 150 MG tablet Take 1 tablet (150 mg total) by mouth daily.  90 tablet  3  .  saw palmetto 160 MG capsule Take 160 mg by mouth 2 (two) times daily.       . timolol (TIMOPTIC) 0.5 % ophthalmic solution Place 1 drop into both eyes 2 (two) times daily.         Current Facility-Administered Medications on File Prior to Visit  Medication Dose Route Frequency Provider Last Rate Last Dose  . methylPREDNISolone acetate PF (DEPO-MEDROL) injection 40 mg  40 mg Intra-articular Once Stacie Glaze, MD        BP 140/84  Pulse 64  Temp(Src) 98.3 F (36.8 C)  Resp 16  Ht 6\' 5"  (1.956 m)  Wt 184 lb (83.462 kg)  BMI 21.81 kg/m2        Objective:   Physical Exam  Nursing note and vitals reviewed. Constitutional: He appears well-developed and well-nourished.  HENT:  Head: Normocephalic  and atraumatic.  Eyes: Conjunctivae are normal. Pupils are equal, round, and reactive to light.  Cardiovascular: Normal rate and regular rhythm.   Pulmonary/Chest: Effort normal and breath sounds normal.  Abdominal: Soft. Bowel sounds are normal.  Skin: Skin is warm and dry.          Assessment & Plan:  Dealing with wifes dememtia Exercise needed HTN stable Thyroid monitoring

## 2012-10-26 NOTE — Patient Instructions (Signed)
The proscar replaces the salt palmetto

## 2012-11-16 ENCOUNTER — Other Ambulatory Visit: Payer: Self-pay | Admitting: *Deleted

## 2012-11-16 DIAGNOSIS — N401 Enlarged prostate with lower urinary tract symptoms: Secondary | ICD-10-CM

## 2012-11-16 MED ORDER — FINASTERIDE 5 MG PO TABS: 5.0000 mg | ORAL_TABLET | Freq: Every day | ORAL | Status: DC

## 2013-03-03 ENCOUNTER — Ambulatory Visit (INDEPENDENT_AMBULATORY_CARE_PROVIDER_SITE_OTHER): Payer: Medicare Other | Admitting: Internal Medicine

## 2013-03-03 ENCOUNTER — Encounter: Payer: Self-pay | Admitting: Internal Medicine

## 2013-03-03 VITALS — BP 110/70 | HR 60 | Temp 98.0°F | Resp 16 | Ht 77.0 in | Wt 190.0 lb

## 2013-03-03 DIAGNOSIS — E039 Hypothyroidism, unspecified: Secondary | ICD-10-CM

## 2013-03-03 DIAGNOSIS — I10 Essential (primary) hypertension: Secondary | ICD-10-CM

## 2013-03-03 MED ORDER — FOLIC ACID-VIT B6-VIT B12 2.5-25-1 MG PO TABS: 1.0000 | ORAL_TABLET | Freq: Every day | ORAL | Status: DC

## 2013-03-03 NOTE — Patient Instructions (Signed)
The patient is instructed to continue all medications as prescribed. Schedule followup with check out clerk upon leaving the clinic  

## 2013-03-03 NOTE — Progress Notes (Signed)
Subjective:    Patient ID: Daniel Everett, male    DOB: 08/27/1931, 79 y.o.   MRN: [id number]  Hypertension  Hyperlipidemia   Discussed wifes care Memory issues and UTI's Moderate stress both mental and physical   Review of Systems  Constitutional: Negative.   HENT: Positive for hearing loss.   Respiratory: Negative.   Cardiovascular: Negative.   Gastrointestinal: Negative.   Neurological: Negative.   Psychiatric/Behavioral: Positive for dysphoric mood. The patient is nervous/anxious.    Past Medical History  Diagnosis Date  . HYPOTHYROIDISM 11/27/2006  . Allergic rhinitis due to pollen 06/07/2008  . DIVERTICULOSIS, COLON 11/27/2006  . GEN OSTEOARTHROSIS INVOLVING MULTIPLE SITES 01/03/2010  . OSTEOARTHRITIS 11/27/2006  . WEIGHT LOSS, RECENT 11/27/2006  . HYPERGLYCEMIA 10/18/2008  . NEOPLASM, MALIGNANT, PROSTATE, HX OF, S/P TURP 05/28/2007  . COLONIC POLYPS, HX OF 10/28/2006    History   Social History  . Marital Status: Married    Spouse Name: N/A    Number of Children: N/A  . Years of Education: N/A   Occupational History  . retired    Social History Main Topics  . Smoking status: Never Smoker   . Smokeless tobacco: Not on file  . Alcohol Use: No  . Drug Use: No  . Sexual Activity: Yes   Other Topics Concern  . Not on file   Social History Narrative  . No narrative on file    Past Surgical History  Procedure Laterality Date  . Rotator cuff repair    . Arthroscopy rt knee    . Hydrocele excision / repair      Family History  Problem Relation Age of Onset  . Coronary artery disease Mother   . Heart disease Mother   . Pancreatic cancer Father   . Cancer Father     No Known Allergies  Current Outpatient Prescriptions on File Prior to Visit  Medication Sig Dispense Refill  . amLODipine (NORVASC) 5 MG tablet Take 0.5 tablets (2.5 mg total) by mouth daily.  90 tablet  3  . aspirin 81 MG tablet Take 81 mg by mouth daily.        Marland Kitchen atorvastatin (LIPITOR)  40 MG tablet Take 0.5 tablets (20 mg total) by mouth daily.  90 tablet  3  . finasteride (PROSCAR) 5 MG tablet Take 1 tablet (5 mg total) by mouth daily.  90 tablet  3  . glucosamine-chondroitin 500-400 MG tablet Take 2 tablets by mouth 2 (two) times daily.        Marland Kitchen latanoprost (XALATAN) 0.005 % ophthalmic solution 1 drop at bedtime.      Marland Kitchen levothyroxine (SYNTHROID, LEVOTHROID) 50 MCG tablet Take 1 tablet (50 mcg total) by mouth daily.  90 tablet  3  . lisinopril (PRINIVIL,ZESTRIL) 20 MG tablet Take 1 tablet (20 mg total) by mouth daily.  90 tablet  3  . Multiple Vitamin (MULTIVITAMIN) tablet Take 1 tablet by mouth daily.        . ranitidine (ZANTAC) 150 MG tablet Take 1 tablet (150 mg total) by mouth daily.  90 tablet  3  . timolol (TIMOPTIC) 0.5 % ophthalmic solution Place 1 drop into both eyes 2 (two) times daily.         Current Facility-Administered Medications on File Prior to Visit  Medication Dose Route Frequency Provider Last Rate Last Dose  . methylPREDNISolone acetate PF (DEPO-MEDROL) injection 40 mg  40 mg Intra-articular Once Stacie Glaze, MD  BP 110/70  Pulse 60  Temp(Src) 98 F (36.7 C)  Resp 16  Ht 6\' 5"  (1.956 m)  Wt 190 lb (86.183 kg)  BMI 22.53 kg/m2        Objective:   Physical Exam  Nursing note and vitals reviewed. Constitutional: He is oriented to person, place, and time. He appears well-developed and well-nourished.  HENT:  Head: Atraumatic.  Cardiovascular: Normal rate.   Murmur heard. Pulmonary/Chest: Effort normal and breath sounds normal.  Abdominal: Bowel sounds are normal.  Neurological: He is alert and oriented to person, place, and time.  Skin: Skin is dry.          Assessment & Plan:  Increased stress Wife with UTI Stable HTN Stable lipids  Stable hypothyrtoidism  revealed the blood work from the last visit

## 2013-03-03 NOTE — Progress Notes (Signed)
Pre visit review using our clinic review tool, if applicable. No additional management support is needed unless otherwise documented below in the visit note. 

## 2013-05-14 ENCOUNTER — Other Ambulatory Visit: Payer: Self-pay | Admitting: *Deleted

## 2013-05-14 DIAGNOSIS — F439 Reaction to severe stress, unspecified: Secondary | ICD-10-CM

## 2013-05-14 DIAGNOSIS — I10 Essential (primary) hypertension: Secondary | ICD-10-CM

## 2013-05-14 DIAGNOSIS — R351 Nocturia: Secondary | ICD-10-CM

## 2013-05-14 DIAGNOSIS — E785 Hyperlipidemia, unspecified: Secondary | ICD-10-CM

## 2013-05-14 DIAGNOSIS — N401 Enlarged prostate with lower urinary tract symptoms: Secondary | ICD-10-CM

## 2013-05-14 MED ORDER — ATORVASTATIN CALCIUM 40 MG PO TABS: 20.0000 mg | ORAL_TABLET | Freq: Every day | ORAL | Status: DC

## 2013-05-14 MED ORDER — AMLODIPINE BESYLATE 5 MG PO TABS: 2.5000 mg | ORAL_TABLET | Freq: Every day | ORAL | Status: DC

## 2013-05-14 MED ORDER — LEVOTHYROXINE SODIUM 50 MCG PO TABS: 50.0000 ug | ORAL_TABLET | Freq: Every day | ORAL | Status: DC

## 2013-05-14 MED ORDER — LISINOPRIL 20 MG PO TABS: 20.0000 mg | ORAL_TABLET | Freq: Every day | ORAL | Status: DC

## 2013-05-14 MED ORDER — RANITIDINE HCL 150 MG PO TABS: 150.0000 mg | ORAL_TABLET | Freq: Every day | ORAL | Status: DC

## 2013-05-14 MED ORDER — FINASTERIDE 5 MG PO TABS: 5.0000 mg | ORAL_TABLET | Freq: Every day | ORAL | Status: DC

## 2013-07-28 ENCOUNTER — Telehealth: Payer: Self-pay | Admitting: Internal Medicine

## 2013-07-28 NOTE — Telephone Encounter (Signed)
Pt said when trying to split pills they crumble so he is requesting that a new rx be written he is req a 2.5 mg be written so that he can take a whole a pill for the following rx amlodipine  He also a 20 mg for atorvastatin so that he can take a whole pill // he said he is losing meds by trying to cut these pills in half

## 2013-07-28 NOTE — Telephone Encounter (Signed)
Left a message for pt to return call.  Amlodpine does not come in a 2.5 mg tablet.  Advise that pt get a pill cutter to make it easier.  Lipitor can be changed to 20 mg.

## 2013-07-30 MED ORDER — AMLODIPINE BESYLATE 2.5 MG PO TABS: 2.5000 mg | ORAL_TABLET | Freq: Every day | ORAL | Status: DC

## 2013-07-30 MED ORDER — ATORVASTATIN CALCIUM 20 MG PO TABS: 20.0000 mg | ORAL_TABLET | Freq: Every day | ORAL | Status: DC

## 2013-07-30 NOTE — Telephone Encounter (Signed)
Patient spoke with pharmacy and amlodipine does come in a 2.5 mg.  Both prescriptions sent [facility].

## 2013-07-30 NOTE — Telephone Encounter (Signed)
Left message for patient to return our call.

## 2013-09-03 ENCOUNTER — Other Ambulatory Visit (INDEPENDENT_AMBULATORY_CARE_PROVIDER_SITE_OTHER): Payer: Medicare Other

## 2013-09-03 DIAGNOSIS — I10 Essential (primary) hypertension: Secondary | ICD-10-CM

## 2013-09-03 DIAGNOSIS — Z0000 Encounter for general adult medical examination without abnormal findings: Secondary | ICD-10-CM

## 2013-09-03 LAB — POCT URINALYSIS DIPSTICK
GLUCOSE UA: NEGATIVE
LEUKOCYTES UA: NEGATIVE
NITRITE UA: NEGATIVE
RBC UA: NEGATIVE
Spec Grav, UA: >=1.03
UROBILINOGEN UA: 1
pH, UA: 6

## 2013-09-03 LAB — CBC WITH DIFFERENTIAL/PLATELET
BASOS ABS: 0 10*3/uL (ref 0.0–0.1)
Basophils Relative: 0.7 % (ref 0.0–3.0)
EOS ABS: 0.1 10*3/uL (ref 0.0–0.7)
Eosinophils Relative: 1.7 % (ref 0.0–5.0)
HCT: 40.9 % (ref 39.0–52.0)
Hemoglobin: 13.9 g/dL (ref 13.0–17.0)
LYMPHS PCT: 37 % (ref 12.0–46.0)
Lymphs Abs: 1.7 10*3/uL (ref 0.7–4.0)
MCHC: 33.9 g/dL (ref 30.0–36.0)
MCV: 94.6 fl (ref 78.0–100.0)
MONOS PCT: 8.7 % (ref 3.0–12.0)
Monocytes Absolute: 0.4 10*3/uL (ref 0.1–1.0)
Neutro Abs: 2.4 10*3/uL (ref 1.4–7.7)
Neutrophils Relative %: 51.9 % (ref 43.0–77.0)
Platelets: 166 10*3/uL (ref 150.0–400.0)
RBC: 4.32 Mil/uL (ref 4.22–5.81)
RDW: 14 % (ref 11.5–15.5)
WBC: 4.7 10*3/uL (ref 4.0–10.5)

## 2013-09-03 LAB — HEPATIC FUNCTION PANEL
ALT: 18 U/L (ref 0–53)
AST: 27 U/L (ref 0–37)
Albumin: 3.6 g/dL (ref 3.5–5.2)
Alkaline Phosphatase: 41 U/L (ref 39–117)
Bilirubin, Direct: 0.2 mg/dL (ref 0.0–0.3)
Total Bilirubin: 1.1 mg/dL (ref 0.2–1.2)
Total Protein: 6.1 g/dL (ref 6.0–8.3)

## 2013-09-03 LAB — BASIC METABOLIC PANEL
BUN: 16 mg/dL (ref 6–23)
CALCIUM: 9.1 mg/dL (ref 8.4–10.5)
CO2: 29 meq/L (ref 19–32)
CREATININE: 0.9 mg/dL (ref 0.4–1.5)
Chloride: 106 mEq/L (ref 96–112)
GFR: 85.2 mL/min (ref >60.00)
GLUCOSE: 95 mg/dL (ref 70–99)
Potassium: 4.5 mEq/L (ref 3.5–5.1)
SODIUM: 140 meq/L (ref 135–145)

## 2013-09-03 LAB — LIPID PANEL
CHOL/HDL RATIO: 4
Cholesterol: 128 mg/dL (ref 0–200)
HDL: 34.4 mg/dL — AB (ref >39.00)
LDL Cholesterol: 83 mg/dL (ref 0–99)
NonHDL: 93.6
Triglycerides: 53 mg/dL (ref 0.0–149.0)
VLDL: 10.6 mg/dL (ref 0.0–40.0)

## 2013-09-03 LAB — PSA: PSA: 0 ng/mL — ABNORMAL LOW (ref 0.10–4.00)

## 2013-09-03 LAB — TSH: TSH: 2.9 u[IU]/mL (ref 0.35–4.50)

## 2013-09-10 ENCOUNTER — Encounter: Payer: Medicare Other | Admitting: Internal Medicine

## 2013-09-10 ENCOUNTER — Ambulatory Visit (INDEPENDENT_AMBULATORY_CARE_PROVIDER_SITE_OTHER): Payer: Medicare Other | Admitting: Internal Medicine

## 2013-09-10 ENCOUNTER — Encounter: Payer: Self-pay | Admitting: Internal Medicine

## 2013-09-10 VITALS — BP 132/82 | HR 48 | Temp 98.5°F | Ht 77.0 in | Wt 191.0 lb

## 2013-09-10 DIAGNOSIS — Z0000 Encounter for general adult medical examination without abnormal findings: Secondary | ICD-10-CM

## 2013-09-10 DIAGNOSIS — I10 Essential (primary) hypertension: Secondary | ICD-10-CM

## 2013-09-10 DIAGNOSIS — J329 Chronic sinusitis, unspecified: Secondary | ICD-10-CM

## 2013-09-10 MED ORDER — AMOXICILLIN-POT CLAVULANATE 875-125 MG PO TABS: 1.0000 | ORAL_TABLET | Freq: Two times a day (BID) | ORAL | Status: DC

## 2013-09-10 NOTE — Patient Instructions (Signed)
The patient is instructed to continue all medications as prescribed. Schedule followup with check out clerk upon leaving the clinic  

## 2013-09-10 NOTE — Progress Notes (Signed)
Pre visit review using our clinic review tool, if applicable. No additional management support is needed unless otherwise documented below in the visit note. 

## 2013-09-10 NOTE — Progress Notes (Signed)
Subjective:    Patient ID: Daniel Everett, male    DOB: 19-Sep-1931, 80 y.o.   MRN: [id number]  HPI CPX Occasional hematruria with hx of prostate cancer No blood in urine today  Review of Systems  Constitutional: Negative for fever and fatigue.  HENT: Negative for congestion, hearing loss and postnasal drip.   Eyes: Negative for discharge, redness and visual disturbance.  Respiratory: Negative for cough, shortness of breath and wheezing.   Cardiovascular: Negative for leg swelling.  Gastrointestinal: Negative for abdominal pain, constipation and abdominal distention.  Genitourinary: Negative for urgency and frequency.  Musculoskeletal: Negative for arthralgias, joint swelling and neck pain.  Skin: Negative for color change and rash.  Neurological: Negative for weakness and light-headedness.  Hematological: Negative for adenopathy.  Psychiatric/Behavioral: Negative for behavioral problems.   Past Medical History  Diagnosis Date  . HYPOTHYROIDISM 11/27/2006  . Allergic rhinitis due to pollen 06/07/2008  . DIVERTICULOSIS, COLON 11/27/2006  . GEN OSTEOARTHROSIS INVOLVING MULTIPLE SITES 01/03/2010  . OSTEOARTHRITIS 11/27/2006  . WEIGHT LOSS, RECENT 11/27/2006  . HYPERGLYCEMIA 10/18/2008  . NEOPLASM, MALIGNANT, PROSTATE, HX OF, S/P TURP 05/28/2007  . COLONIC POLYPS, HX OF 10/28/2006    History   Social History  . Marital Status: Married    Spouse Name: N/A    Number of Children: N/A  . Years of Education: N/A   Occupational History  . retired    Social History Main Topics  . Smoking status: Never Smoker   . Smokeless tobacco: Not on file  . Alcohol Use: No  . Drug Use: No  . Sexual Activity: Yes   Other Topics Concern  . Not on file   Social History Narrative  . No narrative on file    Past Surgical History  Procedure Laterality Date  . Rotator cuff repair    . Arthroscopy rt knee    . Hydrocele excision / repair      Family History  Problem Relation Age of Onset    . Coronary artery disease Mother   . Heart disease Mother   . Pancreatic cancer Father   . Cancer Father     No Known Allergies  Current Outpatient Prescriptions on File Prior to Visit  Medication Sig Dispense Refill  . amLODipine (NORVASC) 2.5 MG tablet Take 1 tablet (2.5 mg total) by mouth daily.  90 tablet  3  . aspirin 81 MG tablet Take 81 mg by mouth daily.        Marland Kitchen atorvastatin (LIPITOR) 20 MG tablet Take 1 tablet (20 mg total) by mouth daily.  90 tablet  3  . finasteride (PROSCAR) 5 MG tablet Take 1 tablet (5 mg total) by mouth daily.  90 tablet  3  . Folic Acid-Vit T7-GYF V49 (FOLBEE) 2.5-25-1 MG TABS tablet Take 1 tablet by mouth daily.    0  . glucosamine-chondroitin 500-400 MG tablet Take 2 tablets by mouth 2 (two) times daily.        Marland Kitchen latanoprost (XALATAN) 0.005 % ophthalmic solution 1 drop at bedtime.      Marland Kitchen levothyroxine (SYNTHROID, LEVOTHROID) 50 MCG tablet Take 1 tablet (50 mcg total) by mouth daily.  90 tablet  3  . lisinopril (PRINIVIL,ZESTRIL) 20 MG tablet Take 1 tablet (20 mg total) by mouth daily.  90 tablet  3  . Multiple Vitamin (MULTIVITAMIN) tablet Take 1 tablet by mouth daily.        . ranitidine (ZANTAC) 150 MG tablet Take 1 tablet (150 mg total)  by mouth daily.  90 tablet  3  . timolol (TIMOPTIC) 0.5 % ophthalmic solution Place 1 drop into both eyes 2 (two) times daily.         Current Facility-Administered Medications on File Prior to Visit  Medication Dose Route Frequency Laney Bagshaw Last Rate Last Dose  . methylPREDNISolone acetate PF (DEPO-MEDROL) injection 40 mg  40 mg Intra-articular Once Ricard Dillon, MD        BP 132/82  Pulse 48  Temp(Src) 98.5 F (36.9 C) (Oral)  Ht 6\' 5"  (1.956 m)  Wt 191 lb (86.637 kg)  BMI 22.64 kg/m2       Objective:   Physical Exam  Nursing note and vitals reviewed. Constitutional: He is oriented to person, place, and time. He appears well-developed and well-nourished.  HENT:  Head: Normocephalic and atraumatic.   Eyes: Conjunctivae are normal. Pupils are equal, round, and reactive to light.  Neck: Normal range of motion. Neck supple.  Cardiovascular: Normal rate and regular rhythm.   Pulmonary/Chest: Effort normal and breath sounds normal.  Abdominal: Soft. Bowel sounds are normal.  Genitourinary: Rectum normal.  Prostate abscent  Musculoskeletal: Normal range of motion.  Neurological: He is oriented to person, place, and time.  Skin: Skin is dry.  Multiple  SK's          Assessment & Plan:  Patient presents for yearly preventative medicine examination. Medicare questionnaire was completed  All immunizations and health maintenance protocols were reviewed with the patient and needed orders were placed.  Appropriate screening laboratory values were ordered for the patient including screening of hyperlipidemia, renal function and hepatic function. If indicated by BPH, a PSA was ordered.  Medication reconciliation,  past medical history, social history, problem list and allergies were reviewed in detail with the patient  Goals were established with regard to weight loss, exercise, and  diet in compliance with medications  End of life planning was discussed.   If gross hematuria referral to [facility] discussed

## 2013-09-15 ENCOUNTER — Encounter: Payer: Medicare Other | Admitting: Internal Medicine

## 2013-11-03 ENCOUNTER — Telehealth: Payer: Self-pay | Admitting: Internal Medicine

## 2013-11-03 NOTE — Telephone Encounter (Signed)
Dr Elease Hashimoto has agreed to accept this pt. Phone note sent  8/7 under wife, Zakir Henner chart. Pt will call when need to sch appt.

## 2014-02-07 ENCOUNTER — Encounter: Payer: Self-pay | Admitting: Family Medicine

## 2014-02-07 ENCOUNTER — Ambulatory Visit (INDEPENDENT_AMBULATORY_CARE_PROVIDER_SITE_OTHER): Payer: Medicare Other | Admitting: Family Medicine

## 2014-02-07 ENCOUNTER — Ambulatory Visit (INDEPENDENT_AMBULATORY_CARE_PROVIDER_SITE_OTHER): Payer: Medicare Other

## 2014-02-07 VITALS — BP 130/84 | HR 50 | Temp 97.2°F | Wt 192.0 lb

## 2014-02-07 DIAGNOSIS — E785 Hyperlipidemia, unspecified: Secondary | ICD-10-CM

## 2014-02-07 DIAGNOSIS — I10 Essential (primary) hypertension: Secondary | ICD-10-CM

## 2014-02-07 DIAGNOSIS — R490 Dysphonia: Secondary | ICD-10-CM

## 2014-02-07 DIAGNOSIS — E039 Hypothyroidism, unspecified: Secondary | ICD-10-CM

## 2014-02-07 DIAGNOSIS — Z23 Encounter for immunization: Secondary | ICD-10-CM

## 2014-02-07 DIAGNOSIS — G250 Essential tremor: Secondary | ICD-10-CM

## 2014-02-07 NOTE — Progress Notes (Signed)
Subjective:    Patient ID: Daniel Everett, male    DOB: [birth year], 80 y.o.   MRN: [id number]  HPI Patient is seen to establish care. Already established at this clinic but not by me previously. Past medical history reviewed. He has history of osteoarthritis involving multiple joints, history of prostate cancer, hypothyroidism, hyperlipidemia, chronic intermittent hoarseness. He is currently dealing with his wife who has advanced dementia and also lost a brother couple days ago. Overall coping fairly well. He has generally been very active.  Medications reviewed and compliant with all. Blood pressures been stable. No dizziness. No chest pains. Chronic intermittent hoarseness. Frequent postnasal drip symptoms. Recently evaluated by ENT. He also complains of some occasional upper extremity tremor which is worse with intention. He's not had any rigidity. Denies any family history of tremor.  Mild peripheral edema ankles,feet, and lower legs bilaterally. Worse late day. No dyspnea. No orthopnea. No recent medication changes.  Past Medical History  Diagnosis Date  . HYPOTHYROIDISM 11/27/2006  . Allergic rhinitis due to pollen 06/07/2008  . DIVERTICULOSIS, COLON 11/27/2006  . GEN OSTEOARTHROSIS INVOLVING MULTIPLE SITES 01/03/2010  . OSTEOARTHRITIS 11/27/2006  . WEIGHT LOSS, RECENT 11/27/2006  . HYPERGLYCEMIA 10/18/2008  . NEOPLASM, MALIGNANT, PROSTATE, HX OF, S/P TURP 05/28/2007  . COLONIC POLYPS, HX OF 10/28/2006   Past Surgical History  Procedure Laterality Date  . Rotator cuff repair    . Arthroscopy rt knee    . Hydrocele excision / repair      reports that he has never smoked. He does not have any smokeless tobacco history on file. He reports that he does not drink alcohol or use illicit drugs. family history includes Cancer in his father; Coronary artery disease in his mother; Heart disease in his mother; Pancreatic cancer in his father. No Known Allergies    Review of Systems    Constitutional: Negative for fatigue.  HENT: Positive for postnasal drip and voice change. Negative for trouble swallowing.   Eyes: Negative for visual disturbance.  Respiratory: Negative for cough, chest tightness and shortness of breath.   Cardiovascular: Positive for leg swelling. Negative for chest pain and palpitations.  Neurological: Positive for tremors. Negative for dizziness, syncope, weakness, light-headedness and headaches.       Objective:   Physical Exam  Constitutional: He is oriented to person, place, and time. He appears well-developed and well-nourished. No distress.  Neck: Neck supple.  Cardiovascular: Normal rate and regular rhythm.   Pulmonary/Chest: Effort normal and breath sounds normal. No respiratory distress. He has no wheezes. He has no rales.  Musculoskeletal: He exhibits edema.  Trace pitting edema lower legs bilaterally  Neurological: He is alert and oriented to person, place, and time. A cranial nerve deficit is present. Coordination normal.  He has mild tremor upper extremities at rest and this is not extinguished with movement. He has full strength. No rigidity  Psychiatric: He has a normal mood and affect. His behavior is normal.          Assessment & Plan:  #1 hypertension. Stable. Continue current medications #2 hyperlipidemia. Recent lipids from last summer reviewed and stable. #3 osteoarthritis involving multiple joints. Avoid regular use of nonsteroidals #4 hypothyroidism. Recent TSH at goal. Recheck TSH in 6 months #5 intermittent hoarseness off and on for years. Probably postnasal drip related. He's had previous ENT evaluation as above. Try over-the-counter Flonase or Nasacort for postnasal drip symptoms #6 tremor. 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 He is not a candidate for beta  blockers with baseline low pulse. Symptoms are not severe enough to dictate medication this point #7 mild extremity edema. Suspected venous stasis and possibly  diastolic dysfunction. Minimal edema and a symptomatically otherwise. Observe for now #8 health maintenance. Flu vaccine and Prevnar given

## 2014-02-07 NOTE — Patient Instructions (Signed)
Consider over the counter Flonase or Nasacort for postnasal drip symptoms.

## 2014-02-07 NOTE — Progress Notes (Signed)
Pre visit review using our clinic review tool, if applicable. No additional management support is needed unless otherwise documented below in the visit note. 

## 2014-02-08 ENCOUNTER — Telehealth: Payer: Self-pay | Admitting: Family Medicine

## 2014-02-08 NOTE — Telephone Encounter (Signed)
emmi emailed °

## 2014-03-28 ENCOUNTER — Ambulatory Visit (INDEPENDENT_AMBULATORY_CARE_PROVIDER_SITE_OTHER): Payer: Medicare Other | Admitting: Family Medicine

## 2014-03-28 ENCOUNTER — Encounter: Payer: Self-pay | Admitting: Family Medicine

## 2014-03-28 VITALS — BP 128/78 | HR 60 | Temp 97.3°F | Wt 196.0 lb

## 2014-03-28 DIAGNOSIS — R42 Dizziness and giddiness: Secondary | ICD-10-CM

## 2014-03-28 MED ORDER — LISINOPRIL 20 MG PO TABS: 20.0000 mg | ORAL_TABLET | Freq: Every day | ORAL | Status: DC

## 2014-03-28 MED ORDER — AMLODIPINE BESYLATE 2.5 MG PO TABS: 2.5000 mg | ORAL_TABLET | Freq: Every day | ORAL | Status: DC

## 2014-03-28 MED ORDER — ATORVASTATIN CALCIUM 20 MG PO TABS: 20.0000 mg | ORAL_TABLET | Freq: Every day | ORAL | Status: DC

## 2014-03-28 MED ORDER — LEVOTHYROXINE SODIUM 50 MCG PO TABS: 50.0000 ug | ORAL_TABLET | Freq: Every day | ORAL | Status: DC

## 2014-03-28 NOTE — Progress Notes (Signed)
Pre visit review using our clinic review tool, if applicable. No additional management support is needed unless otherwise documented below in the visit note. 

## 2014-03-28 NOTE — Patient Instructions (Signed)
Benign Positional Vertigo Vertigo means you feel like you or your surroundings are moving when they are not. Benign positional vertigo is the most common form of vertigo. Benign means that the cause of your condition is not serious. Benign positional vertigo is more common in older adults. CAUSES  Benign positional vertigo is the result of an upset in the labyrinth system. This is an area in the middle ear that helps control your balance. This may be caused by a viral infection, head injury, or repetitive motion. However, often no specific cause is found. SYMPTOMS  Symptoms of benign positional vertigo occur when you move your head or eyes in different directions. Some of the symptoms may include:  Loss of balance and falls.  Vomiting.  Blurred vision.  Dizziness.  Nausea.  Involuntary eye movements (nystagmus). DIAGNOSIS  Benign positional vertigo is usually diagnosed by physical exam. If the specific cause of your benign positional vertigo is unknown, your caregiver may perform imaging tests, such as magnetic resonance imaging (MRI) or computed tomography (CT). TREATMENT  Your caregiver may recommend movements or procedures to correct the benign positional vertigo. Medicines such as meclizine, benzodiazepines, and medicines for nausea may be used to treat your symptoms. In rare cases, if your symptoms are caused by certain conditions that affect the inner ear, you may need surgery. HOME CARE INSTRUCTIONS   Follow your caregiver's instructions.  Move slowly. Do not make sudden body or head movements.  Avoid driving.  Avoid operating heavy machinery.  Avoid performing any tasks that would be dangerous to you or others during a vertigo episode.  Drink enough fluids to keep your urine clear or pale yellow. SEEK IMMEDIATE MEDICAL CARE IF:   You develop problems with walking, weakness, numbness, or using your arms, hands, or legs.  You have difficulty speaking.  You develop  severe headaches.  Your nausea or vomiting continues or gets worse.  You develop visual changes.  Your family or friends notice any behavioral changes.  Your condition gets worse.  You have a fever.  You develop a stiff neck or sensitivity to light. MAKE SURE YOU:   Understand these instructions.  Will watch your condition.  Will get help right away if you are not doing well or get worse. Document Released: 12/17/2005 Document Revised: 06/03/2011 Document Reviewed: 11/29/2010 Exit[employer] Patient Information 2015 [employer]. This information is not intended to replace advice given to you by your health care provider. Make sure you discuss any questions you have with your health care provider.    

## 2014-03-28 NOTE — Progress Notes (Signed)
   Subjective:    Patient ID: Daniel Everett, male    DOB: 11-20-1931, 81 y.o.   MRN: [id number]  HPI Patient seen for acute visit. He is here with chief complaint of dizziness. By further description, this sounds more like vertigo. He describes unsteadiness which is usually very transient. First noticed couple nights ago when getting up going to the bathroom. Symptoms seem to be triggered by movement. Very brief and mild symptoms. Denies any syncopal or presyncopal symptoms. No orthostatic symptoms. He describes this as" feeling off balance". No sudden hearing changes. Denies any dysphagia or any ataxia. No focal weakness. No confusion. No prior history of vertigo.  No alleviating factors but again symptoms only last transiently for seconds to minutes. Denies any nausea or vomiting   Reviewed with no major changes:  Past Medical History  Diagnosis Date  . HYPOTHYROIDISM 11/27/2006  . Allergic rhinitis due to pollen 06/07/2008  . DIVERTICULOSIS, COLON 11/27/2006  . GEN OSTEOARTHROSIS INVOLVING MULTIPLE SITES 01/03/2010  . OSTEOARTHRITIS 11/27/2006  . WEIGHT LOSS, RECENT 11/27/2006  . HYPERGLYCEMIA 10/18/2008  . NEOPLASM, MALIGNANT, PROSTATE, HX OF, S/P TURP 05/28/2007  . COLONIC POLYPS, HX OF 10/28/2006   Past Surgical History  Procedure Laterality Date  . Rotator cuff repair    . Arthroscopy rt knee    . Hydrocele excision / repair      reports that he has never smoked. He does not have any smokeless tobacco history on file. He reports that he does not drink alcohol or use illicit drugs. family history includes Cancer in his father; Coronary artery disease in his mother; Heart disease in his mother; Pancreatic cancer in his father. No Known Allergies    Review of Systems  Constitutional: Negative for fever, chills and appetite change.  HENT: Negative for trouble swallowing and voice change.   Respiratory: Negative for shortness of breath.   Cardiovascular: Negative for chest pain.    Neurological: Positive for dizziness. Negative for tremors, seizures, syncope, speech difficulty, weakness and headaches.  Psychiatric/Behavioral: Negative for confusion.       Objective:   Physical Exam  Constitutional: He is oriented to person, place, and time. He appears well-developed and well-nourished.  HENT:  Right Ear: External ear normal.  Left Ear: External ear normal.  Eyes: Pupils are equal, round, and reactive to light.  Neck: Neck supple.  No carotid bruits  Cardiovascular: Normal rate and regular rhythm.   Pulmonary/Chest: Effort normal and breath sounds normal. No respiratory distress. He has no wheezes.  Musculoskeletal: He exhibits no edema.  Neurological: He is alert and oriented to person, place, and time. No cranial nerve deficit. Coordination normal.  No focal weakness. Gait normal. Normal cerebellar by finger to nose testing          Assessment & Plan:  Dizziness. By description sounds more like transient vertigo. Suspect benign peripheral positional vertigo. Handout given. Consider referral for vestibular rehabilitation if symptoms persist

## 2014-04-19 ENCOUNTER — Other Ambulatory Visit: Payer: Self-pay

## 2014-04-19 DIAGNOSIS — R351 Nocturia: Principal | ICD-10-CM

## 2014-04-19 DIAGNOSIS — N401 Enlarged prostate with lower urinary tract symptoms: Secondary | ICD-10-CM

## 2014-04-19 MED ORDER — RANITIDINE HCL 150 MG PO TABS: 150.0000 mg | ORAL_TABLET | Freq: Every day | ORAL | Status: DC

## 2014-04-19 MED ORDER — FINASTERIDE 5 MG PO TABS: 5.0000 mg | ORAL_TABLET | Freq: Every day | ORAL | Status: DC

## 2014-06-03 DIAGNOSIS — H2513 Age-related nuclear cataract, bilateral: Secondary | ICD-10-CM | POA: Diagnosis not present

## 2014-06-29 ENCOUNTER — Other Ambulatory Visit: Payer: Self-pay | Admitting: Dermatology

## 2014-06-29 DIAGNOSIS — L570 Actinic keratosis: Secondary | ICD-10-CM | POA: Diagnosis not present

## 2014-06-29 DIAGNOSIS — D225 Melanocytic nevi of trunk: Secondary | ICD-10-CM | POA: Diagnosis not present

## 2014-06-29 DIAGNOSIS — L820 Inflamed seborrheic keratosis: Secondary | ICD-10-CM | POA: Diagnosis not present

## 2014-06-29 DIAGNOSIS — C44519 Basal cell carcinoma of skin of other part of trunk: Secondary | ICD-10-CM | POA: Diagnosis not present

## 2014-06-29 DIAGNOSIS — L821 Other seborrheic keratosis: Secondary | ICD-10-CM | POA: Diagnosis not present

## 2014-07-07 DIAGNOSIS — H2512 Age-related nuclear cataract, left eye: Secondary | ICD-10-CM | POA: Diagnosis not present

## 2014-07-07 DIAGNOSIS — H25812 Combined forms of age-related cataract, left eye: Secondary | ICD-10-CM | POA: Diagnosis not present

## 2014-08-08 ENCOUNTER — Ambulatory Visit (INDEPENDENT_AMBULATORY_CARE_PROVIDER_SITE_OTHER): Payer: Medicare Other | Admitting: Family Medicine

## 2014-08-08 VITALS — BP 130/78 | HR 55 | Temp 97.5°F | Ht 77.0 in | Wt 192.4 lb

## 2014-08-08 DIAGNOSIS — I10 Essential (primary) hypertension: Secondary | ICD-10-CM | POA: Diagnosis not present

## 2014-08-08 DIAGNOSIS — E785 Hyperlipidemia, unspecified: Secondary | ICD-10-CM

## 2014-08-08 DIAGNOSIS — E038 Other specified hypothyroidism: Secondary | ICD-10-CM | POA: Diagnosis not present

## 2014-08-08 LAB — BASIC METABOLIC PANEL
BUN: 12 mg/dL (ref 6–23)
CHLORIDE: 104 meq/L (ref 96–112)
CO2: 30 meq/L (ref 19–32)
Calcium: 9 mg/dL (ref 8.4–10.5)
Creatinine, Ser: 0.87 mg/dL (ref 0.40–1.50)
GFR: 89.53 mL/min (ref >60.00)
Glucose, Bld: 114 mg/dL — ABNORMAL HIGH (ref 70–99)
Potassium: 4.2 mEq/L (ref 3.5–5.1)
Sodium: 137 mEq/L (ref 135–145)

## 2014-08-08 LAB — HEPATIC FUNCTION PANEL
ALBUMIN: 3.6 g/dL (ref 3.5–5.2)
ALT: 17 U/L (ref 0–53)
AST: 27 U/L (ref 0–37)
Alkaline Phosphatase: 46 U/L (ref 39–117)
Bilirubin, Direct: 0.2 mg/dL (ref 0.0–0.3)
TOTAL PROTEIN: 6.4 g/dL (ref 6.0–8.3)
Total Bilirubin: 1 mg/dL (ref 0.2–1.2)

## 2014-08-08 LAB — LIPID PANEL
CHOLESTEROL: 132 mg/dL (ref 0–200)
HDL: 41.8 mg/dL (ref >39.00)
LDL Cholesterol: 79 mg/dL (ref 0–99)
NonHDL: 90.2
TRIGLYCERIDES: 58 mg/dL (ref 0.0–149.0)
Total CHOL/HDL Ratio: 3
VLDL: 11.6 mg/dL (ref 0.0–40.0)

## 2014-08-08 LAB — TSH: TSH: 2.82 u[IU]/mL (ref 0.35–4.50)

## 2014-08-08 NOTE — Progress Notes (Signed)
Pre visit review using our clinic review tool, if applicable. No additional management support is needed unless otherwise documented below in the visit note. 

## 2014-08-08 NOTE — Progress Notes (Signed)
   Subjective:    Patient ID: Daniel Everett, male    DOB: 01-31-1932, 81 y.o.   MRN: [id number]  HPI Routine 6 month follow-up  He continues to take care of his wife who has advanced dementia and is on hospice. She's been on hospice for over a year now.  Hypothyroidism. On levothyroxin. Needs follow-up labs today. No fatigue issues.  Hypertension treated with amlodipine and lisinopril. No recent dizziness. No chest pains. He has some chronic mild lower extremity edema which is unchanged.  Hyperlipidemia treated with atorvastatin. No history of CAD or peripheral mass or disease. No myalgias.  Past Medical History  Diagnosis Date  . HYPOTHYROIDISM 11/27/2006  . Allergic rhinitis due to pollen 06/07/2008  . DIVERTICULOSIS, COLON 11/27/2006  . GEN OSTEOARTHROSIS INVOLVING MULTIPLE SITES 01/03/2010  . OSTEOARTHRITIS 11/27/2006  . WEIGHT LOSS, RECENT 11/27/2006  . HYPERGLYCEMIA 10/18/2008  . NEOPLASM, MALIGNANT, PROSTATE, HX OF, S/P TURP 05/28/2007  . COLONIC POLYPS, HX OF 10/28/2006   Past Surgical History  Procedure Laterality Date  . Rotator cuff repair    . Arthroscopy rt knee    . Hydrocele excision / repair      reports that he has never smoked. He does not have any smokeless tobacco history on file. He reports that he does not drink alcohol or use illicit drugs. family history includes Cancer in his father; Coronary artery disease in his mother; Heart disease in his mother; Pancreatic cancer in his father. No Known Allergies    Review of Systems  Constitutional: Negative for fatigue and unexpected weight change.  Eyes: Negative for visual disturbance.  Respiratory: Negative for cough, chest tightness and shortness of breath.   Cardiovascular: Positive for leg swelling. Negative for chest pain and palpitations.  Gastrointestinal: Negative for abdominal pain.  Endocrine: Negative for polydipsia and polyuria.  Genitourinary: Negative for dysuria.  Neurological: Negative for dizziness,  syncope, weakness, light-headedness and headaches.       Objective:   Physical Exam  Constitutional: He is oriented to person, place, and time. He appears well-developed.  Neck: No JVD present.  Cardiovascular: Regular rhythm.   Rate around 60  Pulmonary/Chest: Effort normal and breath sounds normal. No respiratory distress. He has no wheezes. He has no rales.  Musculoskeletal: He exhibits edema.  Neurological: He is alert and oriented to person, place, and time.          Assessment & Plan:  #1 hypertension. Stable and at goal. Continue current medications #2 hypothyroidism. Recheck TSH #3 hyperlipidemia. Check lipid and hepatic panel. Continue Lipitor.

## 2014-08-23 DIAGNOSIS — L309 Dermatitis, unspecified: Secondary | ICD-10-CM | POA: Diagnosis not present

## 2014-08-23 DIAGNOSIS — Z85828 Personal history of other malignant neoplasm of skin: Secondary | ICD-10-CM | POA: Diagnosis not present

## 2014-08-23 DIAGNOSIS — Z08 Encounter for follow-up examination after completed treatment for malignant neoplasm: Secondary | ICD-10-CM | POA: Diagnosis not present

## 2014-09-08 DIAGNOSIS — H2511 Age-related nuclear cataract, right eye: Secondary | ICD-10-CM | POA: Diagnosis not present

## 2014-09-08 DIAGNOSIS — H25811 Combined forms of age-related cataract, right eye: Secondary | ICD-10-CM | POA: Diagnosis not present

## 2014-09-14 ENCOUNTER — Telehealth: Payer: Self-pay | Admitting: *Deleted

## 2014-09-14 NOTE — Telephone Encounter (Signed)
Pt walked in at 3:30 today wanting to be seen by Dr Elease Hashimoto for left shoulder pain.  Pain is posterior under shoulder blade.  He states he does not know what he did, no injury.  Pain started 2 days ago.  He took an Aleve this morning but nothing else.  He rates the pain as an 8.  BP 142/94, p 52, temp 98.0.  No appointments available today with any provider.  He would like for Dr Elease Hashimoto to work him in.  Spoke to Dr Elease Hashimoto and per Dr Elease Hashimoto he can see patient but it would be at the end of the day.  Told pt what Dr Elease Hashimoto said and he stated he did not want to wait that long.  Told him he could either go to Urgent Care or make an appt to see him tomorrow.  He said he could not come tomorrow cause he worked all day.  He scheduled an appt for Friday 09/16/14 at 1:45.  Told pt to continue the aleve and call if pain gets worse.  Pt verbalized understanding and had no questions.

## 2014-09-16 ENCOUNTER — Ambulatory Visit (INDEPENDENT_AMBULATORY_CARE_PROVIDER_SITE_OTHER): Payer: Medicare Other | Admitting: Family Medicine

## 2014-09-16 ENCOUNTER — Encounter: Payer: Self-pay | Admitting: Family Medicine

## 2014-09-16 VITALS — BP 130/82 | HR 66 | Temp 97.4°F | Wt 191.0 lb

## 2014-09-16 DIAGNOSIS — M546 Pain in thoracic spine: Secondary | ICD-10-CM

## 2014-09-16 DIAGNOSIS — M549 Dorsalgia, unspecified: Secondary | ICD-10-CM

## 2014-09-16 MED ORDER — METHOCARBAMOL 500 MG PO TABS: 500.0000 mg | ORAL_TABLET | Freq: Three times a day (TID) | ORAL | Status: DC | PRN

## 2014-09-16 NOTE — Progress Notes (Signed)
Pre visit review using our clinic review tool, if applicable. No additional management support is needed unless otherwise documented below in the visit note. 

## 2014-09-16 NOTE — Patient Instructions (Signed)
Try some topical heat and muscle massage Let me know if no better in one week.

## 2014-09-16 NOTE — Progress Notes (Signed)
   Subjective:    Patient ID: Daniel Everett, male    DOB: 04/16/1931, 81 y.o.   MRN: [id number]  HPI  patient seen with left periscapular pain. Onset about 5 days ago. No injury. Pain is occasionally sharp and worse with movement. Denies any pleuritic pain. No cough. No fevers or chills. No skin rash. Has used Aleve without much relief.  No history of similar pain previously. Denies any cervical neck pain. No upper extremity pain or weakness.  Past Medical History  Diagnosis Date  . HYPOTHYROIDISM 11/27/2006  . Allergic rhinitis due to pollen 06/07/2008  . DIVERTICULOSIS, COLON 11/27/2006  . GEN OSTEOARTHROSIS INVOLVING MULTIPLE SITES 01/03/2010  . OSTEOARTHRITIS 11/27/2006  . WEIGHT LOSS, RECENT 11/27/2006  . HYPERGLYCEMIA 10/18/2008  . NEOPLASM, MALIGNANT, PROSTATE, HX OF, S/P TURP 05/28/2007  . COLONIC POLYPS, HX OF 10/28/2006   Past Surgical History  Procedure Laterality Date  . Rotator cuff repair    . Arthroscopy rt knee    . Hydrocele excision / repair      reports that he has never smoked. He does not have any smokeless tobacco history on file. He reports that he does not drink alcohol or use illicit drugs. family history includes Cancer in his father; Coronary artery disease in his mother; Heart disease in his mother; Pancreatic cancer in his father. No Known Allergies    Review of Systems  Constitutional: Negative for fever, chills, appetite change and unexpected weight change.  Respiratory: Negative for cough and shortness of breath.   Cardiovascular: Negative for chest pain.  Skin: Negative for rash.       Objective:   Physical Exam  Constitutional: He appears well-developed and well-nourished. No distress.  Cardiovascular: Normal rate and regular rhythm.   Pulmonary/Chest: Effort normal and breath sounds normal. No respiratory distress. He has no wheezes. He has no rales.  Musculoskeletal:  Full range of motion left shoulder. Patient has some muscular tenderness just  medial and superior to the left scapula. He has palpable muscle tension. No spinal tenderness. No skin rash          Assessment & Plan:  Left upper back pain. Suspect muscular. Continue moist heat and cautious use of Aleve. Cautious use of Robaxin 500 mg daily at bedtime. Consider physical therapy if not improving in 1-2 weeks.

## 2014-10-14 ENCOUNTER — Encounter: Payer: Self-pay | Admitting: Family Medicine

## 2014-10-14 ENCOUNTER — Ambulatory Visit (INDEPENDENT_AMBULATORY_CARE_PROVIDER_SITE_OTHER): Payer: Medicare Other | Admitting: Family Medicine

## 2014-10-14 VITALS — BP 130/80 | HR 76 | Temp 97.5°F | Ht 77.0 in | Wt 189.0 lb

## 2014-10-14 DIAGNOSIS — Z8546 Personal history of malignant neoplasm of prostate: Secondary | ICD-10-CM

## 2014-10-14 DIAGNOSIS — Z0000 Encounter for general adult medical examination without abnormal findings: Secondary | ICD-10-CM

## 2014-10-14 DIAGNOSIS — Z860100 Personal history of colon polyps, unspecified: Secondary | ICD-10-CM

## 2014-10-14 LAB — PSA: PSA: 0 ng/mL — ABNORMAL LOW (ref 0.10–4.00)

## 2014-10-14 NOTE — Progress Notes (Signed)
Subjective:    Patient ID: Daniel Everett, male    DOB: 10-21-1931, 81 y.o.   MRN: [id number]  HPI Patient seen for physical exam. His chronic problems include hypothyroidism, hyperlipidemia, hypertension, diverticulosis of colon, past history of colon polyps. Prostate cancer with previous TURP. He is requesting repeat PSA. These been normal the past. His last colonoscopy was about 14 years ago. He is not any recent stool changes but is also requesting referral for consideration for repeat colonoscopy.  He is 65 but is generally very healthy. He had recent labs including thyroid function lipids and chemistries back in May that were sent showing normal. Mild essential tremor which is not causing any major problems at this time  Past Medical History  Diagnosis Date  . HYPOTHYROIDISM 11/27/2006  . Allergic rhinitis due to pollen 06/07/2008  . DIVERTICULOSIS, COLON 11/27/2006  . GEN OSTEOARTHROSIS INVOLVING MULTIPLE SITES 01/03/2010  . OSTEOARTHRITIS 11/27/2006  . WEIGHT LOSS, RECENT 11/27/2006  . HYPERGLYCEMIA 10/18/2008  . NEOPLASM, MALIGNANT, PROSTATE, HX OF, S/P TURP 05/28/2007  . COLONIC POLYPS, HX OF 10/28/2006   Past Surgical History  Procedure Laterality Date  . Rotator cuff repair    . Arthroscopy rt knee    . Hydrocele excision / repair      reports that he has never smoked. He does not have any smokeless tobacco history on file. He reports that he does not drink alcohol or use illicit drugs. family history includes Cancer in his father; Coronary artery disease in his mother; Heart disease in his mother; Pancreatic cancer in his father. No Known Allergies    Review of Systems  Constitutional: Negative for fever, activity change, appetite change, fatigue and unexpected weight change.  HENT: Negative for congestion, ear pain and trouble swallowing.   Eyes: Negative for pain and visual disturbance.  Respiratory: Negative for cough, shortness of breath and wheezing.   Cardiovascular:  Negative for chest pain and palpitations.  Gastrointestinal: Negative for nausea, vomiting, abdominal pain, diarrhea, constipation, blood in stool, abdominal distention and rectal pain.  Endocrine: Negative for polydipsia and polyuria.  Genitourinary: Negative for dysuria, hematuria and testicular pain.  Musculoskeletal: Negative for joint swelling and arthralgias.  Skin: Negative for rash.  Neurological: Positive for tremors. Negative for dizziness, seizures, syncope, weakness and headaches.  Hematological: Negative for adenopathy.  Psychiatric/Behavioral: Negative for confusion and dysphoric mood.       Objective:   Physical Exam  Constitutional: He is oriented to person, place, and time. He appears well-developed and well-nourished. No distress.  HENT:  Head: Normocephalic and atraumatic.  Right Ear: External ear normal.  Left Ear: External ear normal.  Mouth/Throat: Oropharynx is clear and moist.  Eyes: Conjunctivae and EOM are normal. Pupils are equal, round, and reactive to light.  Neck: Normal range of motion. Neck supple. No thyromegaly present.  Cardiovascular: Normal rate, regular rhythm and normal heart sounds.   No murmur heard. Pulmonary/Chest: No respiratory distress. He has no wheezes. He has no rales.  Abdominal: Soft. Bowel sounds are normal. He exhibits no distension and no mass. There is no tenderness. There is no rebound and no guarding.  Musculoskeletal: He exhibits no edema.  Lymphadenopathy:    He has no cervical adenopathy.  Neurological: He is alert and oriented to person, place, and time. He displays normal reflexes. No cranial nerve deficit. Coordination normal.  Skin: No rash noted.  Psychiatric: He has a normal mood and affect.  Assessment & Plan:  Complete physical. Immunizations are up-to-date. We recommend yearly flu vaccine. Patient requesting referral for repeat colonoscopy. He is 79 but is in excellent health otherwise and has not had  screening in 14 years.. Requesting repeat PSA. Past history of prostate cancer.

## 2014-10-14 NOTE — Patient Instructions (Signed)
We will call you with colonoscopy appointment

## 2014-10-14 NOTE — Progress Notes (Signed)
Pre visit review using our clinic review tool, if applicable. No additional management support is needed unless otherwise documented below in the visit note. 

## 2015-02-08 ENCOUNTER — Ambulatory Visit (INDEPENDENT_AMBULATORY_CARE_PROVIDER_SITE_OTHER): Payer: Medicare Other | Admitting: Family Medicine

## 2015-02-08 ENCOUNTER — Encounter: Payer: Self-pay | Admitting: Family Medicine

## 2015-02-08 VITALS — BP 150/88 | HR 45 | Temp 97.5°F | Resp 16 | Ht 77.0 in | Wt 197.1 lb

## 2015-02-08 DIAGNOSIS — I10 Essential (primary) hypertension: Secondary | ICD-10-CM | POA: Diagnosis not present

## 2015-02-08 DIAGNOSIS — M1990 Unspecified osteoarthritis, unspecified site: Secondary | ICD-10-CM

## 2015-02-08 DIAGNOSIS — R351 Nocturia: Secondary | ICD-10-CM

## 2015-02-08 DIAGNOSIS — Z23 Encounter for immunization: Secondary | ICD-10-CM

## 2015-02-08 DIAGNOSIS — K219 Gastro-esophageal reflux disease without esophagitis: Secondary | ICD-10-CM

## 2015-02-08 HISTORY — DX: Gastro-esophageal reflux disease without esophagitis: K21.9

## 2015-02-08 HISTORY — DX: Nocturia: R35.1

## 2015-02-08 NOTE — Progress Notes (Signed)
Pre visit review using our clinic review tool, if applicable. No additional management support is needed unless otherwise documented below in the visit note. 

## 2015-02-08 NOTE — Patient Instructions (Signed)
Increase ranitidine to 150 mg twice daily Monitor blood pressure and be in touch if consistently greater than 150/90 Consider over-the-counter Tylenol 500 mg 1-2 every 12 hours as needed for arthritis pain We will call you regarding urology referral

## 2015-02-08 NOTE — Progress Notes (Signed)
   Subjective:    Patient ID: Daniel Everett, male    DOB: April 04, 1931, 81 y.o.   MRN: [id number]  HPI Here to discuss several things as follows  Hypertension history. Generally well-controlled. He takes lisinopril and low-dose amlodipine. Compliant with medications but did not take his blood pressure medication this morning. No recent headaches or dizziness. No reported medication side effects  History of GERD. Still has occasional hoarseness and clearing of throat on Zantac once daily. No dysphagia.  Osteoarthritis involving multiple joints. Recent nurse practitioner with his insurance suggested meloxicam but at age 51 this would be dangerous for him in terms of elevated blood pressure, risk for peptic ulcer disease, and potential nephrotoxicity-especially on lisinopril. He has not tried Tylenol. He has multiple joints involved especially knees  Nocturia. Prior TURP. PSA and July was 0. Gets up sometimes every 2 hours at night. Still has slow stream. Has not seen urologist in several years. No history of known urethral stricture. Denies any burning with urination  Past Medical History  Diagnosis Date  . HYPOTHYROIDISM 11/27/2006  . Allergic rhinitis due to pollen 06/07/2008  . DIVERTICULOSIS, COLON 11/27/2006  . GEN OSTEOARTHROSIS INVOLVING MULTIPLE SITES 01/03/2010  . OSTEOARTHRITIS 11/27/2006  . WEIGHT LOSS, RECENT 11/27/2006  . HYPERGLYCEMIA 10/18/2008  . NEOPLASM, MALIGNANT, PROSTATE, HX OF, S/P TURP 05/28/2007  . COLONIC POLYPS, HX OF 10/28/2006   Past Surgical History  Procedure Laterality Date  . Rotator cuff repair    . Arthroscopy rt knee    . Hydrocele excision / repair    . Prostate surgery      TURP    reports that he has never smoked. He does not have any smokeless tobacco history on file. He reports that he does not drink alcohol or use illicit drugs. family history includes Cancer in his father; Coronary artery disease in his mother; Heart disease in his mother; Pancreatic  cancer in his father. No Known Allergies    Review of Systems  Constitutional: Negative for fatigue and unexpected weight change.  HENT: Negative for trouble swallowing.   Eyes: Negative for visual disturbance.  Respiratory: Negative for cough, chest tightness and shortness of breath.   Cardiovascular: Negative for chest pain, palpitations and leg swelling.  Gastrointestinal: Negative for abdominal pain.  Genitourinary: Positive for frequency and difficulty urinating. Negative for hematuria.  Neurological: Negative for dizziness, syncope, weakness, light-headedness and headaches.  Psychiatric/Behavioral: Negative for confusion.       Objective:   Physical Exam  Constitutional: He is oriented to person, place, and time. He appears well-developed and well-nourished.  Neck: Neck supple. No thyromegaly present.  Cardiovascular: Regular rhythm.   Rate of around 50 which is normal for him  Pulmonary/Chest: Effort normal and breath sounds normal. No respiratory distress. He has no wheezes. He has no rales.  Musculoskeletal: He exhibits no edema.  Neurological: He is alert and oriented to person, place, and time.          Assessment & Plan:  #1 hypertension. Slightly elevated reading today but did not take medications. Monitor closely at home and be in touch if consistently greater than 150/90 #2 GERD. Still occasional breakthrough symptoms. Increase Zantac to twice daily #3 osteoarthritis involving multiple joints. Try Tylenol 500 milligrams 1-2 every 12 hours as a first step. Avoid nonsteroidals for reasons above #4 nocturia. Prior history of TURP. He is describing some slow stream. Set up urology referral-? Urethral stricture versus other

## 2015-02-09 ENCOUNTER — Other Ambulatory Visit: Payer: Self-pay | Admitting: Family Medicine

## 2015-02-09 ENCOUNTER — Other Ambulatory Visit: Payer: Self-pay

## 2015-02-09 NOTE — Telephone Encounter (Signed)
RX sent in for patient. 

## 2015-02-09 NOTE — Telephone Encounter (Signed)
Patient is requesting that the ranitidine (ZANTAC) 150 MG tablet 90 supply  Pharmacy: [facility]

## 2015-02-17 ENCOUNTER — Other Ambulatory Visit: Payer: Self-pay | Admitting: Family Medicine

## 2015-02-20 ENCOUNTER — Other Ambulatory Visit: Payer: Self-pay | Admitting: Family Medicine

## 2015-03-14 ENCOUNTER — Telehealth: Payer: Self-pay | Admitting: Family Medicine

## 2015-03-14 MED ORDER — RANITIDINE HCL 150 MG PO TABS: 150.0000 mg | ORAL_TABLET | Freq: Two times a day (BID) | ORAL | Status: DC

## 2015-03-14 NOTE — Telephone Encounter (Signed)
ranitidine (ZANTAC) 150 MG tablet needs the instructions changed back to take 2 daily like it used to be, instead of take 1 daily. He doesn't understand why this was changed.

## 2015-03-14 NOTE — Telephone Encounter (Signed)
Pt is aware that RX has been sent into the pharmacy with correct directions.

## 2015-04-03 DIAGNOSIS — R351 Nocturia: Secondary | ICD-10-CM | POA: Diagnosis not present

## 2015-04-03 DIAGNOSIS — C61 Malignant neoplasm of prostate: Secondary | ICD-10-CM | POA: Diagnosis not present

## 2015-04-03 DIAGNOSIS — N3941 Urge incontinence: Secondary | ICD-10-CM | POA: Diagnosis not present

## 2015-04-03 DIAGNOSIS — Z0000 Encounter for general adult medical examination without abnormal findings: Secondary | ICD-10-CM | POA: Diagnosis not present

## 2015-04-06 ENCOUNTER — Other Ambulatory Visit: Payer: Self-pay | Admitting: Family Medicine

## 2015-04-06 DIAGNOSIS — Z0000 Encounter for general adult medical examination without abnormal findings: Secondary | ICD-10-CM | POA: Diagnosis not present

## 2015-04-06 DIAGNOSIS — N3941 Urge incontinence: Secondary | ICD-10-CM | POA: Diagnosis not present

## 2015-04-13 DIAGNOSIS — L821 Other seborrheic keratosis: Secondary | ICD-10-CM | POA: Diagnosis not present

## 2015-04-13 DIAGNOSIS — D1801 Hemangioma of skin and subcutaneous tissue: Secondary | ICD-10-CM | POA: Diagnosis not present

## 2015-04-13 DIAGNOSIS — L570 Actinic keratosis: Secondary | ICD-10-CM | POA: Diagnosis not present

## 2015-04-13 DIAGNOSIS — L812 Freckles: Secondary | ICD-10-CM | POA: Diagnosis not present

## 2015-04-13 DIAGNOSIS — C44629 Squamous cell carcinoma of skin of left upper limb, including shoulder: Secondary | ICD-10-CM | POA: Diagnosis not present

## 2015-04-26 ENCOUNTER — Encounter: Payer: Self-pay | Admitting: Gastroenterology

## 2015-05-08 DIAGNOSIS — Z0000 Encounter for general adult medical examination without abnormal findings: Secondary | ICD-10-CM | POA: Diagnosis not present

## 2015-05-08 DIAGNOSIS — R351 Nocturia: Secondary | ICD-10-CM | POA: Diagnosis not present

## 2015-05-08 DIAGNOSIS — N3941 Urge incontinence: Secondary | ICD-10-CM | POA: Diagnosis not present

## 2015-05-09 ENCOUNTER — Encounter: Payer: Self-pay | Admitting: Gastroenterology

## 2015-05-11 ENCOUNTER — Encounter: Payer: Self-pay | Admitting: Family Medicine

## 2015-05-11 ENCOUNTER — Ambulatory Visit (INDEPENDENT_AMBULATORY_CARE_PROVIDER_SITE_OTHER): Payer: Medicare Other | Admitting: Family Medicine

## 2015-05-11 VITALS — BP 160/100 | HR 63 | Temp 97.5°F | Ht 77.0 in | Wt 201.3 lb

## 2015-05-11 DIAGNOSIS — I10 Essential (primary) hypertension: Secondary | ICD-10-CM | POA: Diagnosis not present

## 2015-05-11 DIAGNOSIS — K219 Gastro-esophageal reflux disease without esophagitis: Secondary | ICD-10-CM | POA: Diagnosis not present

## 2015-05-11 DIAGNOSIS — M1990 Unspecified osteoarthritis, unspecified site: Secondary | ICD-10-CM | POA: Diagnosis not present

## 2015-05-11 NOTE — Patient Instructions (Signed)
INCREASE THE AMLODIPINE TO 5 MG DAILY (MAY TAKE TWO OF THE 2.5 MG TABLETS).

## 2015-05-11 NOTE — Progress Notes (Signed)
   Subjective:    Patient ID: Daniel Everett, male    DOB: 02-Jul-1931, 82 y.o.   MRN: [id number]  HPI Patient here to discuss multiple items  Hypertension. Elevated last visit. Was not taking blood pressure medicine consistently that time. Takes amlodipine 2.5 mg daily and lisinopril 20 mg daily Compliant with therapy. No headaches. No dizziness. No chest pains.  Still has occasional GERD symptoms. Does not actually have any burning in esophagus He has intermittent hoarseness which she attributed to GERD. Symptoms overall are improved compared with couple months ago. No dysphagia. No appetite or weight changes.  Osteoarthritis involving multiple joints. Improved with Tylenol. Plan start exercise program soon which is supervised through [facility]  Recent nocturia. Evaluated by urology. Postvoid residual of 0. He had cystoscopy which was unremarkable. History of prior TURP  Past Medical History  Diagnosis Date  . HYPOTHYROIDISM 11/27/2006  . Allergic rhinitis due to pollen 06/07/2008  . DIVERTICULOSIS, COLON 11/27/2006  . GEN OSTEOARTHROSIS INVOLVING MULTIPLE SITES 01/03/2010  . OSTEOARTHRITIS 11/27/2006  . WEIGHT LOSS, RECENT 11/27/2006  . HYPERGLYCEMIA 10/18/2008  . NEOPLASM, MALIGNANT, PROSTATE, HX OF, S/P TURP 05/28/2007  . COLONIC POLYPS, HX OF 10/28/2006   Past Surgical History  Procedure Laterality Date  . Rotator cuff repair    . Arthroscopy rt knee    . Hydrocele excision / repair    . Prostate surgery      TURP    reports that he has never smoked. He does not have any smokeless tobacco history on file. He reports that he does not drink alcohol or use illicit drugs. family history includes Cancer in his father; Coronary artery disease in his mother; Heart disease in his mother; Pancreatic cancer in his father. No Known Allergies    Review of Systems  Constitutional: Negative for appetite change, fatigue and unexpected weight change.  HENT: Negative for trouble swallowing.   Eyes:  Negative for visual disturbance.  Respiratory: Negative for cough, chest tightness and shortness of breath.   Cardiovascular: Negative for chest pain, palpitations and leg swelling.  Musculoskeletal: Positive for arthralgias.  Neurological: Negative for dizziness, syncope, weakness, light-headedness and headaches.       Objective:   Physical Exam  Constitutional: He is oriented to person, place, and time. He appears well-developed and well-nourished.  HENT:  Right Ear: External ear normal.  Left Ear: External ear normal.  Mouth/Throat: Oropharynx is clear and moist.  Eyes: Pupils are equal, round, and reactive to light.  Neck: Neck supple. No thyromegaly present.  Cardiovascular: Normal rate and regular rhythm.   Pulmonary/Chest: Effort normal and breath sounds normal. No respiratory distress. He has no wheezes. He has no rales.  Musculoskeletal: He exhibits no edema.  Neurological: He is alert and oriented to person, place, and time.          Assessment & Plan:  #1 hypertension. Poorly controlled. Increase amlodipine to 5 mg daily. Continue lisinopril. Reassess blood pressure 3 weeks. Watch sodium intake  #2 GERD. He has intermittent hoarseness. Discussed possible ENT referral but his symptoms are very intermittent. Continue Zantac.  #3 osteoarthritis-mostly involving knees. Continue Tylenol. Discussed appropriate exercise.

## 2015-05-11 NOTE — Progress Notes (Signed)
Pre visit review using our clinic review tool, if applicable. No additional management support is needed unless otherwise documented below in the visit note. 

## 2015-05-17 DIAGNOSIS — Z961 Presence of intraocular lens: Secondary | ICD-10-CM | POA: Diagnosis not present

## 2015-05-17 DIAGNOSIS — H401113 Primary open-angle glaucoma, right eye, severe stage: Secondary | ICD-10-CM | POA: Diagnosis not present

## 2015-05-17 DIAGNOSIS — H401123 Primary open-angle glaucoma, left eye, severe stage: Secondary | ICD-10-CM | POA: Diagnosis not present

## 2015-05-17 DIAGNOSIS — H26493 Other secondary cataract, bilateral: Secondary | ICD-10-CM | POA: Diagnosis not present

## 2015-05-25 DIAGNOSIS — L570 Actinic keratosis: Secondary | ICD-10-CM | POA: Diagnosis not present

## 2015-05-25 DIAGNOSIS — L821 Other seborrheic keratosis: Secondary | ICD-10-CM | POA: Diagnosis not present

## 2015-06-01 ENCOUNTER — Ambulatory Visit (INDEPENDENT_AMBULATORY_CARE_PROVIDER_SITE_OTHER): Payer: Medicare Other | Admitting: Family Medicine

## 2015-06-01 VITALS — HR 66 | Temp 97.6°F | Ht 77.0 in | Wt 200.9 lb

## 2015-06-01 DIAGNOSIS — I10 Essential (primary) hypertension: Secondary | ICD-10-CM | POA: Diagnosis not present

## 2015-06-01 MED ORDER — AMLODIPINE BESYLATE 5 MG PO TABS: 5.0000 mg | ORAL_TABLET | Freq: Every day | ORAL | Status: DC

## 2015-06-01 NOTE — Progress Notes (Signed)
   Subjective:    Patient ID: Daniel Everett, male    DOB: [birth year], 82 y.o.   MRN: [id number]  HPI Follow-up hypertension. Recently increased his amlodipine to 5 mg daily. Also takes lisinopril. Recent blood pressure here 160/100. Home blood pressures consistently below 140/90. No dizziness. No headaches. No chest pains. He has very small tremor at rest which exaggerates with movement. He is not interested in additional medication this time. Minimal caffeine use.  Past Medical History  Diagnosis Date  . HYPOTHYROIDISM 11/27/2006  . Allergic rhinitis due to pollen 06/07/2008  . DIVERTICULOSIS, COLON 11/27/2006  . GEN OSTEOARTHROSIS INVOLVING MULTIPLE SITES 01/03/2010  . OSTEOARTHRITIS 11/27/2006  . WEIGHT LOSS, RECENT 11/27/2006  . HYPERGLYCEMIA 10/18/2008  . NEOPLASM, MALIGNANT, PROSTATE, HX OF, S/P TURP 05/28/2007  . COLONIC POLYPS, HX OF 10/28/2006   Past Surgical History  Procedure Laterality Date  . Rotator cuff repair    . Arthroscopy rt knee    . Hydrocele excision / repair    . Prostate surgery      TURP    reports that he has never smoked. He does not have any smokeless tobacco history on file. He reports that he does not drink alcohol or use illicit drugs. family history includes Cancer in his father; Coronary artery disease in his mother; Heart disease in his mother; Pancreatic cancer in his father. No Known Allergies    Review of Systems  Constitutional: Negative for fatigue.  Eyes: Negative for visual disturbance.  Respiratory: Negative for cough, chest tightness and shortness of breath.   Cardiovascular: Negative for chest pain, palpitations and leg swelling.  Neurological: Positive for tremors. Negative for dizziness, syncope, weakness, light-headedness and headaches.       Objective:   Physical Exam  Constitutional: He appears well-developed and well-nourished.  Cardiovascular: Normal rate and regular rhythm.   Pulmonary/Chest: Effort normal and breath sounds  normal. No respiratory distress. He has no wheezes. He has no rales.  Musculoskeletal: He exhibits no edema.          Assessment & Plan:  Hypertension. Improved. Continue current medications. Sent in refills and for amlodipine 5 mg daily for 90 with 3 refills. Schedule complete physical for after July

## 2015-06-01 NOTE — Patient Instructions (Signed)
Monitor blood pressure and be in touch if consistently > 150/90 

## 2015-06-01 NOTE — Progress Notes (Signed)
Pre visit review using our clinic review tool, if applicable. No additional management support is needed unless otherwise documented below in the visit note. 

## 2015-06-02 ENCOUNTER — Ambulatory Visit: Payer: Medicare Other | Admitting: Family Medicine

## 2015-06-09 DIAGNOSIS — C61 Malignant neoplasm of prostate: Secondary | ICD-10-CM | POA: Diagnosis not present

## 2015-06-09 DIAGNOSIS — Z0000 Encounter for general adult medical examination without abnormal findings: Secondary | ICD-10-CM | POA: Diagnosis not present

## 2015-06-09 DIAGNOSIS — R351 Nocturia: Secondary | ICD-10-CM | POA: Diagnosis not present

## 2015-06-09 DIAGNOSIS — N3941 Urge incontinence: Secondary | ICD-10-CM | POA: Diagnosis not present

## 2015-07-28 ENCOUNTER — Other Ambulatory Visit: Payer: Self-pay | Admitting: Family Medicine

## 2015-08-23 ENCOUNTER — Other Ambulatory Visit: Payer: Self-pay | Admitting: Family Medicine

## 2015-08-23 ENCOUNTER — Telehealth: Payer: Self-pay | Admitting: *Deleted

## 2015-08-23 ENCOUNTER — Ambulatory Visit (INDEPENDENT_AMBULATORY_CARE_PROVIDER_SITE_OTHER): Payer: Medicare Other | Admitting: Family Medicine

## 2015-08-23 ENCOUNTER — Ambulatory Visit (INDEPENDENT_AMBULATORY_CARE_PROVIDER_SITE_OTHER)
Admission: RE | Admit: 2015-08-23 | Discharge: 2015-08-23 | Disposition: A | Discharge status: Home / Self Care | Payer: Medicare Other | Source: Ambulatory Visit | Attending: Family Medicine | Admitting: Family Medicine

## 2015-08-23 VITALS — BP 120/90 | HR 69 | Temp 98.6°F | Ht 77.0 in | Wt 203.8 lb

## 2015-08-23 DIAGNOSIS — M25561 Pain in right knee: Secondary | ICD-10-CM

## 2015-08-23 DIAGNOSIS — M1711 Unilateral primary osteoarthritis, right knee: Secondary | ICD-10-CM

## 2015-08-23 DIAGNOSIS — M179 Osteoarthritis of knee, unspecified: Secondary | ICD-10-CM | POA: Diagnosis not present

## 2015-08-23 MED ORDER — DICLOFENAC SODIUM 1 % TD GEL: 2.0000 g | Freq: Four times a day (QID) | TRANSDERMAL | Status: DC

## 2015-08-23 NOTE — Progress Notes (Signed)
Pre visit review using our clinic review tool, if applicable. No additional management support is needed unless otherwise documented below in the visit note. 

## 2015-08-23 NOTE — Telephone Encounter (Signed)
Patient walked into office today, stating "Right knee pain. Like to get X-ray." Spoke with Dr. Elease Hashimoto and he advised to send patient to get xray and schedule to see Dr. Elease Hashimoto later this afternoon. Patient verbalized understanding and plans to see Dr. Elease Hashimoto this afternoon as advised

## 2015-08-23 NOTE — Patient Instructions (Signed)

## 2015-08-23 NOTE — Progress Notes (Signed)
   Subjective:    Patient ID: Daniel Everett, male    DOB: [birth year], 82 y.o.   MRN: [id number]  HPI Patient seen with progressive right knee pain especially over the past few days. He's had some stiffness and pain in his right knee for many years but especially past few days. Denies specific injury. He does recall push mowing over the past week which he thinks maybe aggravated. No specific injury. No redness. No warmth. Using Tylenol without much improvement. Pain is moderate. No effusion. No weakness.  Patient had x-rays earlier today which showed severe osteoarthritis of the medial compartment  Past Medical History  Diagnosis Date  . HYPOTHYROIDISM 11/27/2006  . Allergic rhinitis due to pollen 06/07/2008  . DIVERTICULOSIS, COLON 11/27/2006  . GEN OSTEOARTHROSIS INVOLVING MULTIPLE SITES 01/03/2010  . OSTEOARTHRITIS 11/27/2006  . WEIGHT LOSS, RECENT 11/27/2006  . HYPERGLYCEMIA 10/18/2008  . NEOPLASM, MALIGNANT, PROSTATE, HX OF, S/P TURP 05/28/2007  . COLONIC POLYPS, HX OF 10/28/2006   Past Surgical History  Procedure Laterality Date  . Rotator cuff repair    . Arthroscopy rt knee    . Hydrocele excision / repair    . Prostate surgery      TURP    reports that he has never smoked. He does not have any smokeless tobacco history on file. He reports that he does not drink alcohol or use illicit drugs. family history includes Cancer in his father; Coronary artery disease in his mother; Heart disease in his mother; Pancreatic cancer in his father. No Known Allergies    Review of Systems  Constitutional: Negative for fever and chills.  Musculoskeletal: Positive for arthralgias.       Objective:   Physical Exam  Constitutional: He appears well-developed and well-nourished.  Cardiovascular: Normal rate and regular rhythm.   Pulmonary/Chest: Effort normal and breath sounds normal. No respiratory distress. He has no wheezes. He has no rales.  Musculoskeletal:  Patient has some crepitus  with flexion and extension right knee. No erythema. No ecchymosis. No warmth. No effusion          Assessment & Plan:  Severe primary osteoarthritis right knee. Patient already taking Tylenol without much improvement. He basically has "bone-on-bone" osteoarthritis. His wife is very debilitated and just recently came off hospice and he is reluctant to see surgeon for any kind of more definitive treatment such as knee replacement at this time because of his need to care for her. We discussed other options. He would like to try topical diclofenac gel. We also mentioned potential tramadol if pain becomes more severe. We also discussed risk and benefits of steroid injection and at this point he wishes to try the topical diclofenac first.  [name] MD [facility] at [facility]

## 2015-08-24 ENCOUNTER — Telehealth: Payer: Self-pay | Admitting: Family Medicine

## 2015-08-24 NOTE — Telephone Encounter (Signed)
Pt need PA diclofenac gel for tier exception [phone]

## 2015-08-30 ENCOUNTER — Telehealth: Payer: Self-pay | Admitting: Family Medicine

## 2015-08-30 NOTE — Telephone Encounter (Signed)
Patient has a question about the prescription Dr Elease Hashimoto recently gave him.  Patient needs a call back.

## 2015-08-31 ENCOUNTER — Telehealth: Payer: Self-pay | Admitting: Family Medicine

## 2015-08-31 MED ORDER — DICLOFENAC SODIUM 1 % TD GEL: 2.0000 g | Freq: Four times a day (QID) | TRANSDERMAL | Status: DC

## 2015-08-31 NOTE — Telephone Encounter (Signed)
Spoke with patient. 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 I have resend this into his [employer]. If medication works well for him them we can send to mail order.

## 2015-08-31 NOTE — Telephone Encounter (Signed)
Pt misplace Rx for topical diclofanac and would like to have another one called in to   Pharm:  [facility].

## 2015-09-01 NOTE — Telephone Encounter (Addendum)
Pt would like to know if you have heard anything about this? Pt states he has called several times.  Pt has an appointment on Tues to follow up on how this med is working. No need to keep if he has not started this yet.  [facility]/ [place]

## 2015-09-01 NOTE — Telephone Encounter (Signed)
Prior Authorization for diclofenac sodium (VOLTAREN) 1 % GEL completed through CoverMyMeds

## 2015-09-04 MED ORDER — DICLOFENAC SODIUM 1 % TD GEL: 2.0000 g | Freq: Four times a day (QID) | TRANSDERMAL | Status: DC

## 2015-09-04 NOTE — Telephone Encounter (Signed)
Pt following up on pa request.  Pt is supposed to see Dr Elease Hashimoto tomorrow, but cancelled.  Pt wants to know if Dr Elease Hashimoto wants him to pursue this.

## 2015-09-04 NOTE — Telephone Encounter (Signed)
A copy of denial states pt must have tried and failed  Generic Diclofenac sodium 1% gel  Can you send new rx for generic [facility]?

## 2015-09-04 NOTE — Telephone Encounter (Signed)
Resent in medication to mail order.

## 2015-09-05 ENCOUNTER — Ambulatory Visit: Payer: Medicare Other | Admitting: Family Medicine

## 2015-09-06 NOTE — Telephone Encounter (Signed)
PA has been denied.  

## 2015-09-06 NOTE — Telephone Encounter (Signed)
There is no other equivalent topical.  We had discussed possible steroid injection of knee which might give him several months of relief.  Would consider that and schedule if he is interested.

## 2015-09-06 NOTE — Telephone Encounter (Signed)
Pt notified that PA was denied.  Pt wants to know what else he can use

## 2015-09-06 NOTE — Telephone Encounter (Signed)
Left message with wife to have pt call back

## 2015-09-07 NOTE — Telephone Encounter (Addendum)
[employer] called to ask if we could call the prior authorition line For the generic diclofenac sodium (1%) They never received the PA for this rx. thye states it was the voltaron again. [employer] advised to please specify generic when you call in.  The fastest way is to call. [phone]  Joycelyn Schmid at [facility] states the generic should be approved, just needs the PA Pt has called them several times.

## 2015-09-07 NOTE — Telephone Encounter (Signed)
PA completed as requested.  KeyDyanne Carrel - PA Case ID: [id number]

## 2015-09-08 ENCOUNTER — Other Ambulatory Visit: Payer: Self-pay | Admitting: Family Medicine

## 2015-09-11 NOTE — Telephone Encounter (Signed)
PA for Diclofenac Gel 1% approved through 03/24/16

## 2015-09-19 DIAGNOSIS — H401122 Primary open-angle glaucoma, left eye, moderate stage: Secondary | ICD-10-CM | POA: Diagnosis not present

## 2015-09-19 DIAGNOSIS — H401113 Primary open-angle glaucoma, right eye, severe stage: Secondary | ICD-10-CM | POA: Diagnosis not present

## 2015-10-23 DIAGNOSIS — R351 Nocturia: Secondary | ICD-10-CM | POA: Diagnosis not present

## 2015-10-23 DIAGNOSIS — N401 Enlarged prostate with lower urinary tract symptoms: Secondary | ICD-10-CM | POA: Diagnosis not present

## 2015-10-23 DIAGNOSIS — N3281 Overactive bladder: Secondary | ICD-10-CM | POA: Diagnosis not present

## 2015-11-28 ENCOUNTER — Encounter: Payer: Self-pay | Admitting: Family Medicine

## 2015-11-28 ENCOUNTER — Ambulatory Visit (INDEPENDENT_AMBULATORY_CARE_PROVIDER_SITE_OTHER): Payer: Medicare Other | Admitting: Family Medicine

## 2015-11-28 VITALS — BP 118/80 | HR 69 | Temp 97.5°F | Ht 76.0 in | Wt 194.0 lb

## 2015-11-28 DIAGNOSIS — M1990 Unspecified osteoarthritis, unspecified site: Secondary | ICD-10-CM

## 2015-11-28 DIAGNOSIS — Z0000 Encounter for general adult medical examination without abnormal findings: Secondary | ICD-10-CM | POA: Diagnosis not present

## 2015-11-28 DIAGNOSIS — M1711 Unilateral primary osteoarthritis, right knee: Secondary | ICD-10-CM | POA: Diagnosis not present

## 2015-11-28 DIAGNOSIS — Z23 Encounter for immunization: Secondary | ICD-10-CM | POA: Diagnosis not present

## 2015-11-28 LAB — CBC WITH DIFFERENTIAL/PLATELET
BASOS ABS: 0 10*3/uL (ref 0.0–0.1)
Basophils Relative: 0.5 % (ref 0.0–3.0)
EOS ABS: 0.1 10*3/uL (ref 0.0–0.7)
Eosinophils Relative: 0.9 % (ref 0.0–5.0)
HCT: 45.1 % (ref 39.0–52.0)
Hemoglobin: 15.1 g/dL (ref 13.0–17.0)
LYMPHS ABS: 1.6 10*3/uL (ref 0.7–4.0)
LYMPHS PCT: 26.1 % (ref 12.0–46.0)
MCHC: 33.5 g/dL (ref 30.0–36.0)
MCV: 95.2 fl (ref 78.0–100.0)
MONO ABS: 0.5 10*3/uL (ref 0.1–1.0)
Monocytes Relative: 7.1 % (ref 3.0–12.0)
NEUTROS ABS: 4.1 10*3/uL (ref 1.4–7.7)
NEUTROS PCT: 65.4 % (ref 43.0–77.0)
PLATELETS: 184 10*3/uL (ref 150.0–400.0)
RBC: 4.74 Mil/uL (ref 4.22–5.81)
RDW: 13.8 % (ref 11.5–15.5)
WBC: 6.3 10*3/uL (ref 4.0–10.5)

## 2015-11-28 LAB — HEPATIC FUNCTION PANEL
ALT: 15 U/L (ref 0–53)
AST: 21 U/L (ref 0–37)
Albumin: 4.1 g/dL (ref 3.5–5.2)
Alkaline Phosphatase: 42 U/L (ref 39–117)
BILIRUBIN DIRECT: 0.2 mg/dL (ref 0.0–0.3)
BILIRUBIN TOTAL: 1.2 mg/dL (ref 0.2–1.2)
Total Protein: 6.6 g/dL (ref 6.0–8.3)

## 2015-11-28 LAB — TSH: TSH: 4.17 u[IU]/mL (ref 0.35–4.50)

## 2015-11-28 LAB — LIPID PANEL
CHOL/HDL RATIO: 3
Cholesterol: 130 mg/dL (ref 0–200)
HDL: 39.8 mg/dL (ref >39.00)
LDL CALC: 74 mg/dL (ref 0–99)
NonHDL: 90.64
TRIGLYCERIDES: 81 mg/dL (ref 0.0–149.0)
VLDL: 16.2 mg/dL (ref 0.0–40.0)

## 2015-11-28 LAB — BASIC METABOLIC PANEL
BUN: 15 mg/dL (ref 6–23)
CHLORIDE: 103 meq/L (ref 96–112)
CO2: 29 meq/L (ref 19–32)
CREATININE: 0.92 mg/dL (ref 0.40–1.50)
Calcium: 9 mg/dL (ref 8.4–10.5)
GFR: 83.67 mL/min (ref >60.00)
Glucose, Bld: 120 mg/dL — ABNORMAL HIGH (ref 70–99)
POTASSIUM: 4.3 meq/L (ref 3.5–5.1)
Sodium: 137 mEq/L (ref 135–145)

## 2015-11-28 MED ORDER — METHYLPREDNISOLONE ACETATE 80 MG/ML IJ SUSP
80.0000 mg | Freq: Once | INTRAMUSCULAR | Status: AC
  Administered 2015-11-28: 80 mg via INTRAMUSCULAR

## 2015-11-28 NOTE — Progress Notes (Signed)
Pre visit review using our clinic review tool, if applicable. No additional management support is needed unless otherwise documented below in the visit note. [phone]

## 2015-11-28 NOTE — Progress Notes (Signed)
Subjective:     Patient ID: Daniel Everett, male   DOB: Mar 28, 1931, 82 y.o.   MRN: [id number]  HPI Patient seen for physical exam and for separate issue as below. His chronic problems include history of hypertension, diverticulosis of the colon, GERD, hypothyroidism, osteoarthritis, hyperlipidemia, history of colon polyps, and remote history of prostate cancer status post TURP.  Medications reviewed. Compliant with all. Denies any side effects. Does have some bilateral leg edema which is worse late in the day and is relatively stable. Needs flu vaccine. Other immunizations up-to-date. He is due for lab work  Progressive osteoarthritis right knee. Has mostly medial knee pain. Becoming more debilitated. He is not interested in knee replacement this point because his wife is very debilitated with dementia. Has taken Tylenol but not gotten good relief. We previously discussed possible steroid knee injection for short-term relief  Past Medical History:  Diagnosis Date  . Allergic rhinitis due to pollen 06/07/2008  . COLONIC POLYPS, HX OF 10/28/2006  . DIVERTICULOSIS, COLON 11/27/2006  . GEN OSTEOARTHROSIS INVOLVING MULTIPLE SITES 01/03/2010  . HYPERGLYCEMIA 10/18/2008  . HYPOTHYROIDISM 11/27/2006  . NEOPLASM, MALIGNANT, PROSTATE, HX OF, S/P TURP 05/28/2007  . OSTEOARTHRITIS 11/27/2006  . WEIGHT LOSS, RECENT 11/27/2006   Past Surgical History:  Procedure Laterality Date  . arthroscopy rt knee    . HYDROCELE EXCISION / REPAIR    . PROSTATE SURGERY     TURP  . ROTATOR CUFF REPAIR      reports that he has never smoked. He does not have any smokeless tobacco history on file. He reports that he does not drink alcohol or use drugs. family history includes Cancer in his father; Coronary artery disease in his mother; Heart disease in his mother; Pancreatic cancer in his father. No Known Allergies   Review of Systems  Constitutional: Negative for activity change, appetite change, fatigue and fever.  HENT:  Negative for congestion, ear pain and trouble swallowing.   Eyes: Negative for pain and visual disturbance.  Respiratory: Negative for cough, shortness of breath and wheezing.   Cardiovascular: Negative for chest pain and palpitations.  Gastrointestinal: Negative for abdominal distention, abdominal pain, blood in stool, constipation, diarrhea, nausea, rectal pain and vomiting.  Genitourinary: Negative for dysuria, hematuria and testicular pain.  Musculoskeletal: Positive for arthralgias. Negative for joint swelling.  Skin: Negative for rash.  Neurological: Negative for dizziness, syncope and headaches.  Hematological: Negative for adenopathy.  Psychiatric/Behavioral: Negative for confusion and dysphoric mood.       Objective:   Physical Exam  Constitutional: He is oriented to person, place, and time. He appears well-developed and well-nourished. No distress.  HENT:  Head: Normocephalic and atraumatic.  Right Ear: External ear normal.  Left Ear: External ear normal.  Mouth/Throat: Oropharynx is clear and moist.  Eyes: Conjunctivae and EOM are normal. Pupils are equal, round, and reactive to light.  Neck: Normal range of motion. Neck supple. No thyromegaly present.  Cardiovascular: Normal rate, regular rhythm and normal heart sounds.   No murmur heard. Pulmonary/Chest: No respiratory distress. He has no wheezes. He has no rales.  Abdominal: Soft. Bowel sounds are normal. He exhibits no distension and no mass. There is no tenderness. There is no rebound and no guarding.  Musculoskeletal: He exhibits no edema.  Right knee no effusion. No warmth. Full range of motion. Mild crepitus. Mild medial joint line tenderness-  Lymphadenopathy:    He has no cervical adenopathy.  Neurological: He is alert and oriented to person,  place, and time. He displays normal reflexes. No cranial nerve deficit.  Skin: No rash noted.  Multiple seborrheic keratoses on the trunk  Psychiatric: He has a normal  mood and affect.       Assessment:     #1 physical exam. Patient needs flu vaccine. Other immunizations up-to-date  #2 primary osteoarthritis right knee not relieved with Tylenol      Plan:     -Obtain screening lab work -Flu vaccine given -We discussed risk and benefits of steroid injection right knee. We discussed risk of infection, bruising, bleeding and patient consented. Using Betadine we prepped the skin over the medial compartment medial and inferior to the patella. Used sterile technique throughout. Injected 1 mL of Depo-Medrol 1 mL of plain Xylocaine using 25-gauge one and 1/2 inch needle. Patient tolerated well. No complications.  [name] MD [facility] at [facility]

## 2015-11-28 NOTE — Addendum Note (Signed)
Addended by: Jerl Santos R on: 11/28/2015 10:43 AM   Modules accepted: Orders

## 2015-12-07 DIAGNOSIS — N3281 Overactive bladder: Secondary | ICD-10-CM | POA: Diagnosis not present

## 2015-12-22 ENCOUNTER — Other Ambulatory Visit: Payer: Self-pay | Admitting: Family Medicine

## 2016-01-15 ENCOUNTER — Other Ambulatory Visit: Payer: Self-pay | Admitting: Family Medicine

## 2016-02-14 DIAGNOSIS — L570 Actinic keratosis: Secondary | ICD-10-CM | POA: Diagnosis not present

## 2016-02-14 DIAGNOSIS — D225 Melanocytic nevi of trunk: Secondary | ICD-10-CM | POA: Diagnosis not present

## 2016-02-14 DIAGNOSIS — Z85828 Personal history of other malignant neoplasm of skin: Secondary | ICD-10-CM | POA: Diagnosis not present

## 2016-02-14 DIAGNOSIS — L821 Other seborrheic keratosis: Secondary | ICD-10-CM | POA: Diagnosis not present

## 2016-02-22 DIAGNOSIS — N3281 Overactive bladder: Secondary | ICD-10-CM | POA: Diagnosis not present

## 2016-02-22 DIAGNOSIS — N401 Enlarged prostate with lower urinary tract symptoms: Secondary | ICD-10-CM | POA: Diagnosis not present

## 2016-03-04 DIAGNOSIS — H401111 Primary open-angle glaucoma, right eye, mild stage: Secondary | ICD-10-CM | POA: Diagnosis not present

## 2016-03-04 DIAGNOSIS — H401123 Primary open-angle glaucoma, left eye, severe stage: Secondary | ICD-10-CM | POA: Diagnosis not present

## 2016-03-07 DIAGNOSIS — H26492 Other secondary cataract, left eye: Secondary | ICD-10-CM | POA: Diagnosis not present

## 2016-03-21 ENCOUNTER — Ambulatory Visit (INDEPENDENT_AMBULATORY_CARE_PROVIDER_SITE_OTHER): Payer: Medicare Other | Admitting: Family Medicine

## 2016-03-21 ENCOUNTER — Telehealth: Payer: Self-pay | Admitting: Physician Assistant

## 2016-03-21 ENCOUNTER — Encounter: Payer: Self-pay | Admitting: Family Medicine

## 2016-03-21 VITALS — BP 148/88 | HR 42 | Temp 97.4°F | Wt 206.0 lb

## 2016-03-21 DIAGNOSIS — H26491 Other secondary cataract, right eye: Secondary | ICD-10-CM | POA: Diagnosis not present

## 2016-03-21 DIAGNOSIS — I499 Cardiac arrhythmia, unspecified: Secondary | ICD-10-CM | POA: Diagnosis not present

## 2016-03-21 NOTE — Progress Notes (Signed)
Pre visit review using our clinic review tool, if applicable. No additional management support is needed unless otherwise documented below in the visit note. 

## 2016-03-21 NOTE — Progress Notes (Signed)
Subjective:    Patient ID: [name], male    DOB: 1931-11-17, 82 y.o.   MRN: [id number]  HPI  Mr. Puca is an 82 year old male who presents today for evaluation after seeing his opthamologist who noted an irregular heart rate.  He denies chest pain, palpitations, SOB, dizziness, syncopal or presyncopal symptoms, N/V, diaphoresis, jaw pain, arm pain, weakness, numbness, tingling, or weakness. He does not report a prior history of irregular heart beat. He does state that his heart rate has been slow in the past and that is not a new finding. Associated mild swelling in lower extremities are noted. Chronic problems include hypothyroidism, hyperlipidemia, hypertension, diverticulosis of colon, history of colon polyps and prostate cancer with previous TURP.  Labs in 11/2015 were all stable. He reports feeling well.  Review of Systems  Constitutional: Negative for chills, fatigue and fever.  Respiratory: Negative for cough, shortness of breath and wheezing.   Cardiovascular: Negative for chest pain, palpitations and leg swelling.       Ankle swelling bilaterally   Gastrointestinal: Negative for abdominal pain, diarrhea, nausea and vomiting.  Musculoskeletal: Negative for myalgias.  Skin: Negative for color change, pallor and rash.  Neurological: Negative for dizziness, seizures, weakness, light-headedness, numbness and headaches.   Past Medical History:  Diagnosis Date  . Allergic rhinitis due to pollen 06/07/2008  . COLONIC POLYPS, HX OF 10/28/2006  . DIVERTICULOSIS, COLON 11/27/2006  . GEN OSTEOARTHROSIS INVOLVING MULTIPLE SITES 01/03/2010  . HYPERGLYCEMIA 10/18/2008  . HYPOTHYROIDISM 11/27/2006  . NEOPLASM, MALIGNANT, PROSTATE, HX OF, S/P TURP 05/28/2007  . OSTEOARTHRITIS 11/27/2006  . WEIGHT LOSS, RECENT 11/27/2006     Social History   Social History  . Marital status: Married    Spouse name: N/A  . Number of children: N/A  . Years of education: N/A   Occupational History  .  retired    Social History Main Topics  . Smoking status: Never Smoker  . Smokeless tobacco: Not on file  . Alcohol use No  . Drug use: No  . Sexual activity: Yes   Other Topics Concern  . Not on file   Social History Narrative  . No narrative on file    Past Surgical History:  Procedure Laterality Date  . arthroscopy rt knee    . HYDROCELE EXCISION / REPAIR    . PROSTATE SURGERY     TURP  . ROTATOR CUFF REPAIR      Family History  Problem Relation Age of Onset  . Coronary artery disease Mother   . Heart disease Mother   . Pancreatic cancer Father   . Cancer Father     No Known Allergies  Current Outpatient Prescriptions on File Prior to Visit  Medication Sig Dispense Refill  . acetaminophen (TYLENOL) 500 MG tablet Take 500 mg by mouth every 6 (six) hours as needed. Take one tablet twice a day.    Marland Kitchen amLODipine (NORVASC) 5 MG tablet TAKE 1 TABLET BY MOUTH  DAILY 90 tablet 2  . aspirin 81 MG tablet Take 81 mg by mouth daily.      Marland Kitchen atorvastatin (LIPITOR) 20 MG tablet TAKE 1 TABLET BY MOUTH  DAILY 90 tablet 2  . finasteride (PROSCAR) 5 MG tablet TAKE 1 TABLET BY MOUTH  DAILY 90 tablet 2  . glucosamine-chondroitin 500-400 MG tablet Take 1 tablet by mouth 2 (two) times daily. Reported on 08/23/2015    . latanoprost (XALATAN) 0.005 % ophthalmic solution 1 drop at bedtime.    Marland Kitchen  levothyroxine (SYNTHROID, LEVOTHROID) 50 MCG tablet TAKE 1 TABLET BY MOUTH  DAILY 90 tablet 2  . lisinopril (PRINIVIL,ZESTRIL) 20 MG tablet TAKE 1 TABLET BY MOUTH  DAILY 90 tablet 2  . Multiple Vitamin (MULTIVITAMIN) tablet Take 1 tablet by mouth daily.      . ranitidine (ZANTAC) 150 MG tablet TAKE 1 TABLET BY MOUTH TWO  TIMES DAILY 180 tablet 2  . tamsulosin (FLOMAX) 0.4 MG CAPS capsule Take 0.4 mg by mouth.    . timolol (TIMOPTIC) 0.5 % ophthalmic solution Place 1 drop into both eyes 2 (two) times daily.       No current facility-administered medications on file prior to visit.     BP (!) 148/88  (BP Location: Left Arm, Patient Position: Sitting, Cuff Size: Normal)   Pulse (!) 42   Temp 97.4 F (36.3 C) (Oral)   Wt 206 lb (93.4 kg)   SpO2 96%   BMI 25.08 kg/m        Objective:   Physical Exam  Constitutional: He is oriented to person, place, and time. He appears well-developed and well-nourished.  Eyes: Pupils are equal, round, and reactive to light. No scleral icterus.  Neck: Neck supple.  Cardiovascular: Intact distal pulses.   HR auscultate at 42 bpm. Irregular rhythm noted.   Pulmonary/Chest: Effort normal and breath sounds normal. He has no wheezes. He has no rales.  Abdominal: Soft. Bowel sounds are normal. There is no tenderness.  Musculoskeletal:  1 + nonpitting edema in ankles bilaterally  Lymphadenopathy:    He has no cervical adenopathy.  Neurological: He is alert and oriented to person, place, and time.  Skin: Skin is warm and dry. No rash noted.  Psychiatric: He has a normal mood and affect. His behavior is normal. Judgment and thought content normal.       Assessment & Plan:  1. Irregular heart beat EKG Interpretation: Reviewed EKG with on call cardiology provider; no visible p waves; suspect low atrial fibrillation; slow rate in the low 40s; Advised patient that this rhythm is concerning and urgent evaluation is indicated. Advised transportation via ambulance to the ED which patient declined against medical advice. Further advised patient regarding warning signs of dizziness, syncope, and irregular rhythm. As patient declined transportation via ambulance and also going to the ED at all; cardiology provider advised urgent referral to cardiology with message sent to Gay Filler for patient to be contacted asap.  Referral to cardiology placed after speaking with patient again via telephone. He voiced understanding of his condition; declined ED again; but agreed to urgent evaluation with cardiology.  - EKG 12-Lead  - Ambulatory referral to  Cardiology  Delano Metz, FNP-C

## 2016-03-21 NOTE — Patient Instructions (Signed)
Advise evaluation of irregular heart beat at the emergency room. Advised transportation by ambulance. This has been declined at this time.   Please seek immediate medical attention for symptoms as we discussed. If you develop dizziness, lightheadedness, or any new symptoms.  We are concerned for your well being as your heart rate is very slow and irregular.

## 2016-03-21 NOTE — Telephone Encounter (Signed)
Almira Coaster, NP with [facility] called answering service requesting overread on EKG. Tracing reviewed, appears to be slow atrial fib. Pt is totally asymptomatic per her report. Reviewed with Dr. Radford Pax. We agree with Julie's assessment that the patient should be transported to the ER for further evaluation. Per her report he has declined this but she will talk to him again. I told her she should document that he refused against medical advice, and if he does not comply, Almyra Free can send a message to our coordinator box Gay Filler) to request early OP f/u. (The APPs check this box early in the AM - will also forward this message to AM APP.)  Melina Copa PA-C

## 2016-03-22 ENCOUNTER — Telehealth: Payer: Self-pay | Admitting: *Deleted

## 2016-03-22 ENCOUNTER — Emergency Department (HOSPITAL_COMMUNITY)
Admission: EM | Admit: 2016-03-22 | Discharge: 2016-03-22 | Disposition: A | Discharge status: Home / Self Care | Payer: Medicare Other | Attending: Emergency Medicine | Admitting: Emergency Medicine

## 2016-03-22 ENCOUNTER — Emergency Department (HOSPITAL_COMMUNITY): Payer: Medicare Other

## 2016-03-22 ENCOUNTER — Encounter (HOSPITAL_COMMUNITY): Payer: Self-pay | Admitting: Emergency Medicine

## 2016-03-22 DIAGNOSIS — I10 Essential (primary) hypertension: Secondary | ICD-10-CM | POA: Insufficient documentation

## 2016-03-22 DIAGNOSIS — Z7982 Long term (current) use of aspirin: Secondary | ICD-10-CM | POA: Diagnosis not present

## 2016-03-22 DIAGNOSIS — E039 Hypothyroidism, unspecified: Secondary | ICD-10-CM | POA: Diagnosis not present

## 2016-03-22 DIAGNOSIS — Z79899 Other long term (current) drug therapy: Secondary | ICD-10-CM | POA: Insufficient documentation

## 2016-03-22 DIAGNOSIS — I4891 Unspecified atrial fibrillation: Secondary | ICD-10-CM

## 2016-03-22 DIAGNOSIS — I499 Cardiac arrhythmia, unspecified: Secondary | ICD-10-CM | POA: Diagnosis not present

## 2016-03-22 LAB — CBC WITH DIFFERENTIAL/PLATELET
Basophils Absolute: 0 10*3/uL (ref 0.0–0.1)
Basophils Relative: 0 %
Eosinophils Absolute: 0 10*3/uL (ref 0.0–0.7)
Eosinophils Relative: 1 %
HCT: 43.1 % (ref 39.0–52.0)
Hemoglobin: 14.6 g/dL (ref 13.0–17.0)
Lymphocytes Relative: 26 %
Lymphs Abs: 1.6 10*3/uL (ref 0.7–4.0)
MCH: 31.6 pg (ref 26.0–34.0)
MCHC: 33.9 g/dL (ref 30.0–36.0)
MCV: 93.3 fL (ref 78.0–100.0)
Monocytes Absolute: 0.4 10*3/uL (ref 0.1–1.0)
Monocytes Relative: 7 %
Neutro Abs: 4.3 10*3/uL (ref 1.7–7.7)
Neutrophils Relative %: 66 %
Platelets: 184 10*3/uL (ref 150–400)
RBC: 4.62 MIL/uL (ref 4.22–5.81)
RDW: 13.7 % (ref 11.5–15.5)
WBC: 6.3 10*3/uL (ref 4.0–10.5)

## 2016-03-22 LAB — BASIC METABOLIC PANEL
Anion gap: 9 (ref 5–15)
BUN: 19 mg/dL (ref 6–20)
CO2: 21 mmol/L — ABNORMAL LOW (ref 22–32)
Calcium: 8.7 mg/dL — ABNORMAL LOW (ref 8.9–10.3)
Chloride: 103 mmol/L (ref 101–111)
Creatinine, Ser: 0.9 mg/dL (ref 0.61–1.24)
GFR calc Af Amer: >60 mL/min (ref >60)
GFR calc non Af Amer: >60 mL/min (ref >60)
Glucose, Bld: 108 mg/dL — ABNORMAL HIGH (ref 65–99)
Potassium: 4 mmol/L (ref 3.5–5.1)
Sodium: 133 mmol/L — ABNORMAL LOW (ref 135–145)

## 2016-03-22 MED ORDER — RIVAROXABAN (XARELTO) EDUCATION KIT FOR AFIB PATIENTS
PACK | Freq: Once | Status: AC
  Administered 2016-03-22: 15:00:00
  Filled 2016-03-22: qty 1

## 2016-03-22 MED ORDER — RIVAROXABAN 20 MG PO TABS: 20.0000 mg | ORAL_TABLET | Freq: Every day | ORAL | 0 refills | Status: DC

## 2016-03-22 NOTE — ED Triage Notes (Addendum)
Pt states that he was at the eye doctor yesterday preparing for laser surgery when the staff took his vital signs and stated he had an irregular heart rate. Pt went to his PCP this morning was advised to come to the ED. Pt denies chest pain, nausea, and vomiting.

## 2016-03-22 NOTE — Telephone Encounter (Signed)
Patient walked into office today with concern of appointment with Cardiology not until next Wednesday. Patient was seen by Almira Coaster, NP yesterday, 03/21/16 and was encouraged to go to ED for irregular heartbeat and bradycardia. At time of appointment on 03/21/16 patient refused the ED.   Today I brought patient into exam room and further explained the risks of irregular heartbeat and bradycardia. Patient agreed to go to ED. Called and spoke to [place] at [facility] ED and they stated they will keep eye out for patient. Patient refused transportation via EMS but agreed to go to ED via private vehicle. Patient denied chest pain, SHOB, chills, abnormal fatigue, and palpitations. Will keep eye on patient's chart and ensure he arrives in ED.

## 2016-03-22 NOTE — ED Notes (Signed)
Bed: [id number] Expected date:  Expected time:  Means of arrival:  Comments: Bradycardia from [facility]

## 2016-03-22 NOTE — Discharge Instructions (Signed)
Return here as needed. The cardiologist will call you with an appointment

## 2016-03-22 NOTE — ED Provider Notes (Signed)
[facility] DEPT Provider Note   CSN: [id number] Arrival date & time: 03/22/16  1131     History   Chief Complaint No chief complaint on file.   HPI Daniel Everett is a 82 y.o. male.  HPI Patient presents to the emergency department from his primary care doctor's office with an irregular heartbeat.  The patient states that he was seen at his eye doctor yesterday and noted to have an irregular heartbeat was sent to his primary doctor and they also noted this and sent him to the emergency department.  The patient states that he has no symptoms at this time.  He states occasionally he has some shortness of breath, but does not have any association with any specific activities or instances where this occurs.  The patient states that he has not felt any episodes of heart racing or palpitations.  Patient states that he has never had any cardiac history in the past.  He states that his never seen a cardiologist. The patient denies chest pain, shortness of breath, headache,blurred vision, neck pain, fever, cough, weakness, numbness, dizziness, anorexia, edema, abdominal pain, nausea, vomiting, diarrhea, rash, back pain, dysuria, hematemesis, bloody stool, near syncope, or syncope. Past Medical History:  Diagnosis Date  . Allergic rhinitis due to pollen 06/07/2008  . COLONIC POLYPS, HX OF 10/28/2006  . DIVERTICULOSIS, COLON 11/27/2006  . GEN OSTEOARTHROSIS INVOLVING MULTIPLE SITES 01/03/2010  . HYPERGLYCEMIA 10/18/2008  . HYPOTHYROIDISM 11/27/2006  . NEOPLASM, MALIGNANT, PROSTATE, HX OF, S/P TURP 05/28/2007  . OSTEOARTHRITIS 11/27/2006  . WEIGHT LOSS, RECENT 11/27/2006    Patient Active Problem List   Diagnosis Date Noted  . GERD (gastroesophageal reflux disease) 02/08/2015  . Nocturia 02/08/2015  . Preventative health care 05/04/2010  . GEN OSTEOARTHROSIS INVOLVING MULTIPLE SITES 01/03/2010  . ONYCHOMYCOSIS 06/06/2009  . HOARSENESS, CHRONIC 10/18/2008  . 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    . ALLERGIC RHINITIS DUE TO POLLEN 06/07/2008  . NEOPLASM, MALIGNANT, PROSTATE, HX OF, S/P TURP 05/28/2007  . Hypothyroidism 11/27/2006  . DIVERTICULOSIS, COLON 11/27/2006  . Osteoarthritis 11/27/2006  . Hyperlipidemia 10/28/2006  . Essential hypertension 10/28/2006  . History of colonic polyps 10/28/2006    Past Surgical History:  Procedure Laterality Date  . arthroscopy rt knee    . HYDROCELE EXCISION / REPAIR    . PROSTATE SURGERY     TURP  . ROTATOR CUFF REPAIR         Home Medications    Prior to Admission medications   Medication Sig Start Date End Date Taking? Authorizing Provider  acetaminophen (TYLENOL) 500 MG tablet Take 500 mg by mouth 2 (two) times daily. Take one tablet twice a day.    Yes Historical Provider, MD  amLODipine (NORVASC) 5 MG tablet TAKE 1 TABLET BY MOUTH  DAILY 01/16/16  Yes [name], MD  aspirin 81 MG tablet Take 81 mg by mouth daily.     Yes Historical Provider, MD  atorvastatin (LIPITOR) 20 MG tablet TAKE 1 TABLET BY MOUTH  DAILY 12/22/15  Yes [name], MD  finasteride (PROSCAR) 5 MG tablet TAKE 1 TABLET BY MOUTH  DAILY 12/22/15  Yes [name], MD  latanoprost (XALATAN) 0.005 % ophthalmic solution Place 1 drop into both eyes at bedtime.    Yes Historical Provider, MD  levothyroxine (SYNTHROID, LEVOTHROID) 50 MCG tablet TAKE 1 TABLET BY MOUTH  DAILY 12/22/15  Yes [name], MD  lisinopril (PRINIVIL,ZESTRIL) 20 MG tablet TAKE 1 TABLET BY MOUTH  DAILY  12/22/15  Yes [name], MD  Multiple Vitamin (MULTIVITAMIN) tablet Take 1 tablet by mouth daily.     Yes Historical Provider, MD  Pyridoxine HCl (VITAMIN B-6) 500 MG tablet Take 500 mg by mouth daily.   Yes Historical Provider, MD  ranitidine (ZANTAC) 150 MG tablet TAKE 1 TABLET BY MOUTH TWO  TIMES DAILY 01/16/16  Yes [name], MD  tamsulosin (FLOMAX) 0.4 MG CAPS capsule Take 0.4 mg by mouth daily.    Yes Historical Provider, MD  timolol (TIMOPTIC) 0.5 %  ophthalmic solution Place 1 drop into both eyes 2 (two) times daily.     Yes Historical Provider, MD    Family History Family History  Problem Relation Age of Onset  . Coronary artery disease Mother   . Heart disease Mother   . Pancreatic cancer Father   . Cancer Father     Social History Social History  Substance Use Topics  . Smoking status: Never Smoker  . Smokeless tobacco: Not on file  . Alcohol use No     Allergies   Patient has no known allergies.   Review of Systems Review of Systems All other systems negative except as documented in the HPI. All pertinent positives and negatives as reviewed in the HPI.  Physical Exam Updated Vital Signs BP 132/91 (BP Location: Left Arm)   Pulse 60   Temp 97.5 F (36.4 C) (Oral)   Resp 18   SpO2 100%   Physical Exam  Constitutional: He is oriented to person, place, and time. He appears well-developed and well-nourished. No distress.  HENT:  Head: Normocephalic and atraumatic.  Mouth/Throat: Oropharynx is clear and moist.  Eyes: Pupils are equal, round, and reactive to light.  Neck: Normal range of motion. Neck supple.  Cardiovascular: Normal heart sounds.  An irregular rhythm present. Bradycardia present.  Exam reveals no gallop and no friction rub.   No murmur heard. Pulmonary/Chest: Effort normal and breath sounds normal. No respiratory distress. He has no wheezes.  Abdominal: Soft. Bowel sounds are normal. He exhibits no distension. There is no tenderness.  Neurological: He is alert and oriented to person, place, and time. He exhibits normal muscle tone. Coordination normal.  Skin: Skin is warm and dry. No rash noted. No erythema.  Psychiatric: He has a normal mood and affect. His behavior is normal.  Nursing note and vitals reviewed.    ED Treatments / Results  Labs (all labs ordered are listed but only abnormal results are displayed) Labs Reviewed  BASIC METABOLIC PANEL - Abnormal; Notable for the following:         Result Value   Sodium 133 (*)    CO2 21 (*)    Glucose, Bld 108 (*)    Calcium 8.7 (*)    All other components within normal limits  CBC WITH DIFFERENTIAL/PLATELET    EKG  EKG Interpretation  Date/Time:  Friday March 22 2016 11:45:48 EST Ventricular Rate:  61 PR Interval:    QRS Duration: 102 QT Interval:  461 QTC Calculation: 465 R Axis:   -76 Text Interpretation:  Atrial fibrillation LAD, consider left anterior fascicular block Anterior infarct, old Confirmed by Hazle Coca [phone]) on 03/22/2016 11:51:44 AM       Radiology Dg Chest 2 View  Result Date: 03/22/2016 CLINICAL DATA:  Irregular heart rate EXAM: CHEST  2 VIEW COMPARISON:  07/01/2010 FINDINGS: The heart size and mediastinal contours are within normal limits. Both lungs are clear. The visualized skeletal structures  are unremarkable. IMPRESSION: No active cardiopulmonary disease. Electronically Signed   By: Kerby Moors M.D.   On: 03/22/2016 12:27    Procedures Procedures (including critical care time)  Medications Ordered in ED Medications  rivaroxaban Alveda Reasons) Education Kit for Afib patients (not administered)     Initial Impression / Assessment and Plan / ED Course  I have reviewed the triage vital signs and the nursing notes.  Pertinent labs & imaging results that were available during my care of the patient were reviewed by me and considered in my medical decision making (see chart for details).  Clinical Course     I spoke with cardiology.  They will call the patient for an appointment.  He will be placed on Xarelto patient's rate has been under control here in the emergency department  Final Clinical Impressions(s) / ED Diagnoses   Final diagnoses:  None    New Prescriptions New Prescriptions   No medications on file     Dalia Heading, PA-C 03/22/16 [address], PA-C 03/22/16 [place], MD 03/26/16 Einar Crow

## 2016-03-26 ENCOUNTER — Ambulatory Visit (INDEPENDENT_AMBULATORY_CARE_PROVIDER_SITE_OTHER): Payer: Medicare Other | Admitting: Family Medicine

## 2016-03-26 ENCOUNTER — Telehealth: Payer: Self-pay | Admitting: Family Medicine

## 2016-03-26 ENCOUNTER — Encounter: Payer: Self-pay | Admitting: Family Medicine

## 2016-03-26 VITALS — BP 102/78 | HR 64 | Temp 97.4°F | Ht 76.0 in | Wt 207.9 lb

## 2016-03-26 DIAGNOSIS — E038 Other specified hypothyroidism: Secondary | ICD-10-CM | POA: Diagnosis not present

## 2016-03-26 DIAGNOSIS — I4891 Unspecified atrial fibrillation: Secondary | ICD-10-CM | POA: Diagnosis not present

## 2016-03-26 DIAGNOSIS — I10 Essential (primary) hypertension: Secondary | ICD-10-CM | POA: Diagnosis not present

## 2016-03-26 NOTE — Patient Instructions (Signed)
Atrial Fibrillation Atrial fibrillation is a type of irregular or rapid heartbeat (arrhythmia). In atrial fibrillation, the heart quivers continuously in a chaotic pattern. This occurs when parts of the heart receive disorganized signals that make the heart unable to pump blood normally. This can increase the risk for stroke, heart failure, and other heart-related conditions. There are different types of atrial fibrillation, including:  Paroxysmal atrial fibrillation. This type starts suddenly, and it usually stops on its own shortly after it starts.  Persistent atrial fibrillation. This type often lasts longer than a week. It may stop on its own or with treatment.  Long-lasting persistent atrial fibrillation. This type lasts longer than 12 months.  Permanent atrial fibrillation. This type does not go away. Talk with your health care provider to learn about the type of atrial fibrillation that you have. What are the causes? This condition is caused by some heart-related conditions or procedures, including:  A heart attack.  Coronary artery disease.  Heart failure.  Heart valve conditions.  High blood pressure.  Inflammation of the sac that surrounds the heart (pericarditis).  Heart surgery.  Certain heart rhythm disorders, such as Wolf-Parkinson-White syndrome. Other causes include:  Pneumonia.  Obstructive sleep apnea.  Blockage of an artery in the lungs (pulmonary embolism, or PE).  Lung cancer.  Chronic lung disease.  Thyroid problems, especially if the thyroid is overactive (hyperthyroidism).  Caffeine.  Excessive alcohol use or illegal drug use.  Use of some medicines, including certain decongestants and diet pills. Sometimes, the cause cannot be found. What increases the risk? This condition is more likely to develop in:  People who are older in age.  People who smoke.  People who have diabetes mellitus.  People who are overweight (obese).  Athletes  who exercise vigorously. What are the signs or symptoms? Symptoms of this condition include:  A feeling that your heart is beating rapidly or irregularly.  A feeling of discomfort or pain in your chest.  Shortness of breath.  Sudden light-headedness or weakness.  Getting tired easily during exercise. In some cases, there are no symptoms. How is this diagnosed? Your health care provider may be able to detect atrial fibrillation when taking your pulse. If detected, this condition may be diagnosed with:  An electrocardiogram (ECG).  A Holter monitor test that records your heartbeat patterns over a 24-hour period.  Transthoracic echocardiogram (TTE) to evaluate how blood flows through your heart.  Transesophageal echocardiogram (TEE) to view more detailed images of your heart.  A stress test.  Imaging tests, such as a CT scan or chest X-ray.  Blood tests. How is this treated? The main goals of treatment are to prevent blood clots from forming and to keep your heart beating at a normal rate and rhythm. The type of treatment that you receive depends on many factors, such as your underlying medical conditions and how you feel when you are experiencing atrial fibrillation. This condition may be treated with:  Medicine to slow down the heart rate, bring the heart's rhythm back to normal, or prevent clots from forming.  Electrical cardioversion. This is a procedure that resets your heart's rhythm by delivering a controlled, low-energy shock to the heart through your skin.  Different types of ablation, such as catheter ablation, catheter ablation with pacemaker, or surgical ablation. These procedures destroy the heart tissues that send abnormal signals. When the pacemaker is used, it is placed under your skin to help your heart beat in a regular rhythm. Follow these instructions  at home:  Take over-the counter and prescription medicines only as told by your health care provider.  If  your health care provider prescribed a blood-thinning medicine (anticoagulant), take it exactly as told. Taking too much blood-thinning medicine can cause bleeding. If you do not take enough blood-thinning medicine, you will not have the protection that you need against stroke and other problems.  Do not use tobacco products, including cigarettes, chewing tobacco, and e-cigarettes. If you need help quitting, ask your health care provider.  If you have obstructive sleep apnea, manage your condition as told by your health care provider.  Do not drink alcohol.  Do not drink beverages that contain caffeine, such as coffee, soda, and tea.  Maintain a healthy weight. Do not use diet pills unless your health care provider approves. Diet pills may make heart problems worse.  Follow diet instructions as told by your health care provider.  Exercise regularly as told by your health care provider.  Keep all follow-up visits as told by your health care provider. This is important. How is this prevented?  Avoid drinking beverages that contain caffeine or alcohol.  Avoid certain medicines, especially medicines that are used for breathing problems.  Avoid certain herbs and herbal medicines, such as those that contain ephedra or ginseng.  Do not use illegal drugs, such as cocaine and amphetamines.  Do not smoke.  Manage your high blood pressure. Contact a health care provider if:  You notice a change in the rate, rhythm, or strength of your heartbeat.  You are taking an anticoagulant and you notice increased bruising.  You tire more easily when you exercise or exert yourself. Get help right away if:  You have chest pain, abdominal pain, sweating, or weakness.  You feel nauseous.  You notice blood in your vomit, bowel movement, or urine.  You have shortness of breath.  You suddenly have swollen feet and ankles.  You feel dizzy.  You have sudden weakness or numbness of the face, arm,  or leg, especially on one side of the body.  You have trouble speaking, trouble understanding, or both (aphasia).  Your face or your eyelid droops on one side. These symptoms may represent a serious problem that is an emergency. Do not wait to see if the symptoms will go away. Get medical help right away. Call your local emergency services (911 in the [place] Do not drive yourself to the hospital.  This information is not intended to replace advice given to you by your health care provider. Make sure you discuss any questions you have with your health care provider. Document Released: 03/11/2005 Document Revised: 07/19/2015 Document Reviewed: 07/06/2014 Elsevier Interactive Patient Education  2017 [place] your cardiology follow up Stay on the [place].

## 2016-03-26 NOTE — Telephone Encounter (Signed)
Pt would like to have this added to his medicine list Oxybutynin 5 mg 1 tablet twice a day

## 2016-03-26 NOTE — Progress Notes (Signed)
Subjective:     Patient ID: Daniel Everett, male   DOB: 06-22-1931, 83 y.o.   MRN: [id number]  HPI Patient seen for a recent ER follow-up. He went to his eye doctor recently and was noted to have irregular heart rhythm. He was seen here and diagnosed with atrial fibrillation. He has history of chronic bradycardia for years but no prior history of atrial fibrillation. He's not had any recent dizziness, chest pain, or palpitations. Occasional dyspnea at rest but no exertional chest symptoms or any consistent exertional dyspnea. He has no history of CAD.  EKG back in 2015 which showed sinus bradycardia but no evidence for atrial fibrillation. He went to emergency department and was placed on Xarelto 20 mg once daily. He has pending follow-up with cardiology tomorrow.  Home blood pressure has been very stable. He is currently on regimen of lisinopril and amlodipine. Recent heart rate has been ranging between 44 and 56. Blood pressure very stable.  He has hypothyroidism on replacement and had normal TSH back in September. No history of CVA or TIA. No history of CAD.  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 This is worse with intention and fine motor manipulation.  Long-standing history of GERD. He has infrequent episodes of mild solid food dysphagia. No appetite or weight changes. No pain with swallowing.  Past Medical History:  Diagnosis Date  . Allergic rhinitis due to pollen 06/07/2008  . COLONIC POLYPS, HX OF 10/28/2006  . DIVERTICULOSIS, COLON 11/27/2006  . GEN OSTEOARTHROSIS INVOLVING MULTIPLE SITES 01/03/2010  . HYPERGLYCEMIA 10/18/2008  . HYPOTHYROIDISM 11/27/2006  . NEOPLASM, MALIGNANT, PROSTATE, HX OF, S/P TURP 05/28/2007  . OSTEOARTHRITIS 11/27/2006  . WEIGHT LOSS, RECENT 11/27/2006   Past Surgical History:  Procedure Laterality Date  . arthroscopy rt knee    . HYDROCELE EXCISION / REPAIR    . PROSTATE SURGERY     TURP  . ROTATOR CUFF REPAIR      reports  that he has never smoked. He does not have any smokeless tobacco history on file. He reports that he does not drink alcohol or use drugs. family history includes Cancer in his father; Coronary artery disease in his mother; Heart disease in his mother; Pancreatic cancer in his father. No Known Allergies   Review of Systems  Constitutional: Negative for appetite change, fatigue and unexpected weight change.  HENT: Positive for trouble swallowing (See history of present illness).   Eyes: Negative for visual disturbance.  Respiratory: Negative for cough, chest tightness and shortness of breath.   Cardiovascular: Negative for chest pain, palpitations and leg swelling.  Gastrointestinal: Negative for blood in stool.  Genitourinary: Negative for dysuria.  Neurological: Positive for tremors. Negative for dizziness, syncope, weakness, light-headedness and headaches.  Psychiatric/Behavioral: Negative for confusion.       Objective:   Physical Exam  Constitutional: He is oriented to person, place, and time. He appears well-developed and well-nourished.  HENT:  Right Ear: External ear normal.  Left Ear: External ear normal.  Mouth/Throat: Oropharynx is clear and moist.  Eyes: Pupils are equal, round, and reactive to light.  Neck: Neck supple. No thyromegaly present.  Cardiovascular:  Irregular rhythm with rate palpated around 46  Pulmonary/Chest: Effort normal and breath sounds normal. No respiratory distress. He has no wheezes. He has no rales.  Musculoskeletal: He exhibits no edema.  Neurological: He is alert and oriented to person, place, and time.       Assessment:     #  1 new-onset atrial fibrillation. He has long-standing history of bradycardia and is currently rate controlled. His CHA2DS2-Vasc score is 3 for 3.2% per year risk of CVA.  #2 history of GERD with infrequent solid food dysphagia  #3 upper extremity tremor. 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  [name] MD [facility]

## 2016-03-26 NOTE — Progress Notes (Signed)
Pre visit review using our clinic review tool, if applicable. No additional management support is needed unless otherwise documented below in the visit note. 

## 2016-03-27 ENCOUNTER — Ambulatory Visit (INDEPENDENT_AMBULATORY_CARE_PROVIDER_SITE_OTHER): Payer: Medicare Other | Admitting: Physician Assistant

## 2016-03-27 ENCOUNTER — Encounter: Payer: Self-pay | Admitting: Physician Assistant

## 2016-03-27 VITALS — BP 144/90 | HR 53 | Ht 77.0 in | Wt 204.6 lb

## 2016-03-27 DIAGNOSIS — Z7901 Long term (current) use of anticoagulants: Secondary | ICD-10-CM

## 2016-03-27 DIAGNOSIS — I481 Persistent atrial fibrillation: Secondary | ICD-10-CM | POA: Diagnosis not present

## 2016-03-27 DIAGNOSIS — E8770 Fluid overload, unspecified: Secondary | ICD-10-CM

## 2016-03-27 DIAGNOSIS — Z79899 Other long term (current) drug therapy: Secondary | ICD-10-CM | POA: Diagnosis not present

## 2016-03-27 DIAGNOSIS — I4819 Other persistent atrial fibrillation: Secondary | ICD-10-CM

## 2016-03-27 MED ORDER — HYDROCHLOROTHIAZIDE 12.5 MG PO CAPS: 12.5000 mg | ORAL_CAPSULE | Freq: Every day | ORAL | 3 refills | Status: DC

## 2016-03-27 MED ORDER — HYDROCHLOROTHIAZIDE 12.5 MG PO CAPS: 12.5000 mg | ORAL_CAPSULE | Freq: Every day | ORAL | 1 refills | Status: DC

## 2016-03-27 NOTE — Telephone Encounter (Signed)
Medication is already on pt's list.

## 2016-03-27 NOTE — Progress Notes (Signed)
Cardiology Office Note   Date:  03/27/2016   ID:  Daniel Everett, DOB [birth year], MRN [id number]  PCP:  Daniel Post, MD  Cardiologist:  New, Dr Daniel Everett  Daniel Sieben, PA-C   History of Present Illness: Daniel Everett is a 83 y.o. male with a history of hypothyroid, hyperglycemia, prostate CA s/p TURP, OA, colon polyps, HTN  Eye MD noted irreg HR>>J. Kordsmeier, NP saw and dx afib>>ER visit w/ Xarelto added, HR controlled at baseline>>Dr Daniel Everett saw 01/02 and noted CHA2DS2VASc=3 (age x 2, HTN), cards appt already made  Daniel Everett presents for cardiology evaluation.  His heart rate has always run low, in the high 40s-low 50s. He had no awareness of the atrial fib by symptoms. He had noticed his heart rate was more variable. He noticed he was breathing a little heavier than usual with exertion, didn't understand why. This is a recent occurrence, within the last few months. No sudden or acute change.  He has not had palpitations, has not had presyncope or syncope. He has noticed mild LE edema, mostly during the day. He denies orthopnea or PND. He does not eat a high-sodium diet, no significant weight changes.  He is not having any problems with the Xarelto. Skin (especially on his legs) has been drier and more scaly. No wounds, no problems healing. TSH was normal 11/2015.  He tries to stay active, does not do organized exercise. He mows and does other yard/house work. He takes care of his wife, who has dementia. She is in a facility, has a Actuary during the day. He spends time with her. 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 His sisters have some health problems and he is trying to make sure they get what they need.   Past Medical History:  Diagnosis Date  . Allergic rhinitis due to pollen 06/07/2008  . Benign essential HTN   . COLONIC POLYPS, HX OF 10/28/2006  . DIVERTICULOSIS, COLON 11/27/2006  . GEN OSTEOARTHROSIS INVOLVING  MULTIPLE SITES 01/03/2010  . HYPERGLYCEMIA 10/18/2008  . HYPOTHYROIDISM 11/27/2006  . NEOPLASM, MALIGNANT, PROSTATE, HX OF, S/P TURP 05/28/2007  . OSTEOARTHRITIS 11/27/2006  . WEIGHT LOSS, RECENT 11/27/2006    Past Surgical History:  Procedure Laterality Date  . arthroscopy rt knee    . HYDROCELE EXCISION / REPAIR    . PROSTATE SURGERY     TURP  . ROTATOR CUFF REPAIR      Current Outpatient Prescriptions  Medication Sig Dispense Refill  . acetaminophen (TYLENOL) 500 MG tablet Take 500 mg by mouth 2 (two) times daily. Take one tablet twice a day.     Marland Kitchen amLODipine (NORVASC) 5 MG tablet TAKE 1 TABLET BY MOUTH  DAILY 90 tablet 2  . aspirin 81 MG tablet Take 81 mg by mouth daily.      Marland Kitchen atorvastatin (LIPITOR) 20 MG tablet TAKE 1 TABLET BY MOUTH  DAILY 90 tablet 2  . finasteride (PROSCAR) 5 MG tablet TAKE 1 TABLET BY MOUTH  DAILY 90 tablet 2  . latanoprost (XALATAN) 0.005 % ophthalmic solution Place 1 drop into both eyes at bedtime.     Marland Kitchen levothyroxine (SYNTHROID, LEVOTHROID) 50 MCG tablet TAKE 1 TABLET BY MOUTH  DAILY 90 tablet 2  . lisinopril (PRINIVIL,ZESTRIL) 20 MG tablet TAKE 1 TABLET BY MOUTH  DAILY 90 tablet 2  . Multiple Vitamin (MULTIVITAMIN) tablet Take 1 tablet by mouth daily.      Marland Kitchen  Pyridoxine HCl (VITAMIN B-6) 500 MG tablet Take 500 mg by mouth daily.    . ranitidine (ZANTAC) 150 MG tablet TAKE 1 TABLET BY MOUTH TWO  TIMES DAILY 180 tablet 2  . rivaroxaban (XARELTO) 20 MG TABS tablet Take 1 tablet (20 mg total) by mouth daily with supper. 30 tablet 0  . tamsulosin (FLOMAX) 0.4 MG CAPS capsule Take 0.4 mg by mouth daily.     . timolol (TIMOPTIC) 0.5 % ophthalmic solution Place 1 drop into both eyes 2 (two) times daily.       No current facility-administered medications for this visit.     Allergies:   Patient has no known allergies.    Social History:  The patient  reports that he has never smoked. He does not have any smokeless tobacco history on file. He reports that he does  not drink alcohol or use drugs.   Family History:  The patient's family history includes Cancer in his father; Coronary artery disease in his mother; Heart disease in his mother; Pancreatic cancer in his father.    ROS:  Please see the history of present illness. All other systems are reviewed and negative.    PHYSICAL EXAM: VS:  BP (!) 144/90 (BP Location: Left Arm)   Pulse (!) 53   Ht 6\' 5"  (1.956 m)   Wt 204 lb 9.6 oz (92.8 kg)   BMI 24.26 kg/m  , BMI Body mass index is 24.26 kg/m. GEN: Well nourished, well developed, male in no acute distress  HEENT: normal for age  Neck: no JVD, no carotid bruit, no masses Cardiac: Irreg R&R; no murmur, no rubs, or gallops Respiratory: Slightly decreased BS bases but generally clear to auscultation bilaterally, normal work of breathing GI: soft, nontender, nondistended, + BS MS: no deformity or atrophy; trace edema R, 1+ edema L; distal pulses are 2+ in all 4 extremities   Skin: warm and dry, no rash Neuro:  Strength and sensation are intact Psych: euthymic mood, full affect   EKG:  EKG is ordered today. The ekg ordered today demonstrates Atrial fib, HR 53, Inc RBBB, ?LAFB ECG 2015 was HR 48, morphology about the same.    Recent Labs: 11/28/2015: ALT 15; TSH 4.17 03/22/2016: BUN 19; Creatinine, Ser 0.90; Hemoglobin 14.6; Platelets 184; Potassium 4.0; Sodium 133    Lipid Panel    Component Value Date/Time   CHOL 130 11/28/2015 1006   TRIG 81.0 11/28/2015 1006   HDL 39.80 11/28/2015 1006   CHOLHDL 3 11/28/2015 1006   VLDL 16.2 11/28/2015 1006   LDLCALC 74 11/28/2015 1006     Wt Readings from Last 3 Encounters:  03/27/16 204 lb 9.6 oz (92.8 kg)  03/26/16 207 lb 14.4 oz (94.3 kg)  03/21/16 206 lb (93.4 kg)     Other studies Reviewed: Additional studies/ records that were reviewed today include: office and ER notes.  ASSESSMENT AND PLAN: Dr Daniel Everett was consulted on the patient and agreed with the plan.  1.  Atrial fib: unknown  duration. HR low at baseline, no real change from when he was in SR. Would not use BB/CCB or dig with baseline bradycardia.  Will ck echo and have pt wear Holter monitor. We need to see if he is in afib all the time and if he has pauses or dangeorusly low HRs at times. He may have chronotropic incompetence that is causing his SOB. He may end up needing a PPM.  2. Anticoag: CHA2DS2VASc=3 (age x 2, HTN). Tolerating  Xarelto well, but concerned about the cost. Will refer him to our coumadin clinic to discuss anticoag options. They will manage.   3. HTN: BP is on the high side and he has some extra volume on board. Give info on a low Na diet and add HCTZ 12.5 mg qd. Ck BMET when he comes back for echo/Holter.   Current medicines are reviewed at length with the patient today.  The patient has concerns regarding medicines. Concerns were addressed  The following changes have been made:  no change  Labs/ tests ordered today include:   Orders Placed This Encounter  Procedures  . Holter monitor - 48 hour  . EKG 12-Lead  . ECHOCARDIOGRAM COMPLETE     Disposition:   FU with Dr Daniel Everett or myself.  Augusto Garbe  03/27/2016 11:56 AM    [place] Phone: [phone]; Fax: [phone]  This note was written with the assistance of speech recognition software. Please excuse any transcriptional errors.

## 2016-03-27 NOTE — Patient Instructions (Addendum)
Medication Instructions:  START HYDROCHLOTHIAIZIDE 12.5MG  DAILY  If you need a refill on your cardiac medications before your next appointment, please call your pharmacy.  Labwork: BMET AT [facility] THE DAY OF MONITOR PLACEMENT   Testing/Procedures: Your physician has requested that you have an echocardiogram. Echocardiography is a painless test that uses sound waves to create images of your heart. It provides your doctor with information about the size and shape of your heart and how well your heart's chambers and valves are working. This procedure takes approximately one hour. There are no restrictions for this procedure.  Your physician has recommended that you wear a holter monitor. Holter monitors are medical devices that record the heart's electrical activity. Doctors most often use these monitors to diagnose arrhythmias. Arrhythmias are problems with the speed or rhythm of the heartbeat. The monitor is a small, portable device. You can wear one while you do your normal daily activities. This is usually used to diagnose what is causing palpitations/syncope (passing out).  Follow-Up: Your physician recommends that you schedule a follow-up appointment in: AFTER ECHO WITH DR Martinique, IF UNAVAILABLE OK TO FOLLOW-UP WITH [facility]!! Thank you for choosing [facility] at [facility] [employer], LPN [facility]

## 2016-03-28 ENCOUNTER — Telehealth: Payer: Self-pay | Admitting: Physician Assistant

## 2016-03-28 NOTE — Telephone Encounter (Signed)
Reviewed OV notes - pt seen yesterday. Called patient. Discussed medication HCTZ and recommendation by PA to start this yesterday to help w BP control. Patient aware of rationale for this and notes he will pick up today. Advised if he has any problems or further questions to call. He voiced understanding and thanks. All questions addressed to his satisfaction.

## 2016-03-28 NOTE — Telephone Encounter (Signed)
New Message    hydrochlorothiazide (MICROZIDE) 12.5 MG capsule   Patient can't recall doctor telling him to take. Needing clarification.

## 2016-04-01 ENCOUNTER — Other Ambulatory Visit: Payer: Medicare Other

## 2016-04-03 ENCOUNTER — Telehealth: Payer: Self-pay | Admitting: Physician Assistant

## 2016-04-03 NOTE — Telephone Encounter (Signed)
Returned call to patient-made aware he does not need to fast for bloodwork tomorrow.  Pt aware to go to [facility] location for Holter monitor to be placed tomorrow and have lab work completed then as well (on lab schedule).    Pt verbalized understanding.

## 2016-04-03 NOTE — Telephone Encounter (Signed)
Pt wants to know if he needs to fast for his lab work tomorrow please.

## 2016-04-04 ENCOUNTER — Other Ambulatory Visit: Payer: Medicare Other | Admitting: *Deleted

## 2016-04-04 ENCOUNTER — Ambulatory Visit (INDEPENDENT_AMBULATORY_CARE_PROVIDER_SITE_OTHER): Payer: Medicare Other

## 2016-04-04 DIAGNOSIS — I10 Essential (primary) hypertension: Secondary | ICD-10-CM | POA: Diagnosis not present

## 2016-04-04 DIAGNOSIS — I4819 Other persistent atrial fibrillation: Secondary | ICD-10-CM

## 2016-04-04 DIAGNOSIS — I480 Paroxysmal atrial fibrillation: Secondary | ICD-10-CM

## 2016-04-04 DIAGNOSIS — I481 Persistent atrial fibrillation: Secondary | ICD-10-CM | POA: Diagnosis not present

## 2016-04-04 DIAGNOSIS — E7800 Pure hypercholesterolemia, unspecified: Secondary | ICD-10-CM

## 2016-04-05 LAB — BASIC METABOLIC PANEL
BUN/Creatinine Ratio: 16 (ref 10–24)
BUN: 16 mg/dL (ref 8–27)
CO2: 23 mmol/L (ref 18–29)
Calcium: 9 mg/dL (ref 8.6–10.2)
Chloride: 97 mmol/L (ref 96–106)
Creatinine, Ser: 0.97 mg/dL (ref 0.76–1.27)
GFR, EST AFRICAN AMERICAN: 84 mL/min/{1.73_m2} (ref >59)
GFR, EST NON AFRICAN AMERICAN: 72 mL/min/{1.73_m2} (ref >59)
Glucose: 124 mg/dL — ABNORMAL HIGH (ref 65–99)
POTASSIUM: 4.1 mmol/L (ref 3.5–5.2)
SODIUM: 139 mmol/L (ref 134–144)

## 2016-04-08 ENCOUNTER — Other Ambulatory Visit: Payer: Self-pay

## 2016-04-08 ENCOUNTER — Telehealth: Payer: Self-pay | Admitting: Cardiology

## 2016-04-08 ENCOUNTER — Ambulatory Visit (HOSPITAL_COMMUNITY): Payer: Medicare Other | Attending: Cardiovascular Disease

## 2016-04-08 DIAGNOSIS — I119 Hypertensive heart disease without heart failure: Secondary | ICD-10-CM | POA: Insufficient documentation

## 2016-04-08 DIAGNOSIS — I361 Nonrheumatic tricuspid (valve) insufficiency: Secondary | ICD-10-CM | POA: Diagnosis not present

## 2016-04-08 DIAGNOSIS — I4819 Other persistent atrial fibrillation: Secondary | ICD-10-CM

## 2016-04-08 DIAGNOSIS — Z8249 Family history of ischemic heart disease and other diseases of the circulatory system: Secondary | ICD-10-CM | POA: Insufficient documentation

## 2016-04-08 DIAGNOSIS — I340 Nonrheumatic mitral (valve) insufficiency: Secondary | ICD-10-CM | POA: Diagnosis not present

## 2016-04-08 DIAGNOSIS — I481 Persistent atrial fibrillation: Secondary | ICD-10-CM | POA: Insufficient documentation

## 2016-04-08 DIAGNOSIS — I4891 Unspecified atrial fibrillation: Secondary | ICD-10-CM | POA: Diagnosis present

## 2016-04-08 NOTE — Telephone Encounter (Signed)
New Message  [facility] calling to provide cardiac holter results stating to view sharepoint.

## 2016-04-08 NOTE — Telephone Encounter (Signed)
[facility] tech confirmed sharepoint viewable monitor results - I don't have access to open these & verify. Routed to provider to review.

## 2016-04-09 ENCOUNTER — Other Ambulatory Visit: Payer: Self-pay | Admitting: Physician Assistant

## 2016-04-09 DIAGNOSIS — I4819 Other persistent atrial fibrillation: Secondary | ICD-10-CM

## 2016-04-15 ENCOUNTER — Other Ambulatory Visit: Payer: Self-pay | Admitting: Cardiology

## 2016-04-15 ENCOUNTER — Ambulatory Visit (INDEPENDENT_AMBULATORY_CARE_PROVIDER_SITE_OTHER): Payer: Medicare Other | Admitting: Physician Assistant

## 2016-04-15 VITALS — BP 125/83 | HR 54 | Ht 77.0 in | Wt 205.0 lb

## 2016-04-15 DIAGNOSIS — Z7901 Long term (current) use of anticoagulants: Secondary | ICD-10-CM

## 2016-04-15 DIAGNOSIS — I10 Essential (primary) hypertension: Secondary | ICD-10-CM | POA: Diagnosis not present

## 2016-04-15 DIAGNOSIS — I481 Persistent atrial fibrillation: Secondary | ICD-10-CM | POA: Diagnosis not present

## 2016-04-15 DIAGNOSIS — I4819 Other persistent atrial fibrillation: Secondary | ICD-10-CM

## 2016-04-15 MED ORDER — RIVAROXABAN 20 MG PO TABS: 20.0000 mg | ORAL_TABLET | Freq: Every day | ORAL | 1 refills | Status: DC

## 2016-04-15 NOTE — Telephone Encounter (Signed)
°*  STAT* If patient is at the pharmacy, call can be transferred to refill team.   1. Which medications need to be refilled? (please list name of each medication and dose if known) Xarelto  20mg  tablet   2. Which pharmacy/location (including street and city if local pharmacy) is medication to be sent to? [facility]-Needs a new prescription sent   3. Do they need a 30 day or 90 day supply? [place]

## 2016-04-15 NOTE — Progress Notes (Signed)
Cardiology Office Note   Date:  04/15/2016   ID:  TENZING LINDOR, DOB 05/15/1931, MRN [id number]  PCP:  [name], MD  Cardiologist:  Dr Martinique (new)  Rosaria Ferries, PA-C 03/27/2016   History of Present Illness: Daniel Everett is a 83 y.o. male with a history of hypothyroid, hyperglycemia, prostate CA s/p TURP, OA, colon polyps, HTN, RBBB  01/03, seen for bradycardia, was in atrial fib, HR 50s at baseline, on Xarelto. Holter ordered. BP up, HCTZ added. BMET 1 week later was ok. Holter w/ no pauses > 3.2 sec, HR generally slow, 52 avg, lowest 21 at 5 am.  Daniel Everett presents for cardiology follow up.  Pt is doing fairly well. He is not having any symptoms from the atrial fib. He still has significant family stress from his brother-in-law dying, and a sister-in-law falling with injuries.   He has some mild LE edema. He wears socks with good elastic in them, sleeps in them. He has some balance problems, but has not fallen. He has not been light-headed or dizzy. He has not had fatigue.   He has been participating in [employer], wants to know if activity is ok.   He has not had any bleeding problems with the Xarelto, but is concerned about the cost.  He is concerned about his tremor and his balance issues (encouraged him to discuss these with Dr Elease Hashimoto).   Past Medical History:  Diagnosis Date  . Allergic rhinitis due to pollen 06/07/2008  . Benign essential HTN   . COLONIC POLYPS, HX OF 10/28/2006  . DIVERTICULOSIS, COLON 11/27/2006  . GEN OSTEOARTHROSIS INVOLVING MULTIPLE SITES 01/03/2010  . HYPERGLYCEMIA 10/18/2008  . HYPOTHYROIDISM 11/27/2006  . NEOPLASM, MALIGNANT, PROSTATE, HX OF, S/P TURP 05/28/2007  . OSTEOARTHRITIS 11/27/2006  . WEIGHT LOSS, RECENT 11/27/2006    Past Surgical History:  Procedure Laterality Date  . arthroscopy rt knee    . HYDROCELE EXCISION / REPAIR    . PROSTATE SURGERY     TURP  . ROTATOR CUFF REPAIR      Current Outpatient  Prescriptions  Medication Sig Dispense Refill  . acetaminophen (TYLENOL) 500 MG tablet Take 500 mg by mouth 2 (two) times daily. Take one tablet twice a day.     Marland Kitchen amLODipine (NORVASC) 5 MG tablet TAKE 1 TABLET BY MOUTH  DAILY 90 tablet 2  . aspirin 81 MG tablet Take 81 mg by mouth daily.      Marland Kitchen atorvastatin (LIPITOR) 20 MG tablet TAKE 1 TABLET BY MOUTH  DAILY 90 tablet 2  . finasteride (PROSCAR) 5 MG tablet TAKE 1 TABLET BY MOUTH  DAILY 90 tablet 2  . hydrochlorothiazide (MICROZIDE) 12.5 MG capsule Take 1 capsule (12.5 mg total) by mouth daily. 30 capsule 1  . latanoprost (XALATAN) 0.005 % ophthalmic solution Place 1 drop into both eyes at bedtime.     Marland Kitchen levothyroxine (SYNTHROID, LEVOTHROID) 50 MCG tablet TAKE 1 TABLET BY MOUTH  DAILY 90 tablet 2  . lisinopril (PRINIVIL,ZESTRIL) 20 MG tablet TAKE 1 TABLET BY MOUTH  DAILY 90 tablet 2  . Multiple Vitamin (MULTIVITAMIN) tablet Take 1 tablet by mouth daily.      Marland Kitchen oxybutynin (DITROPAN-XL) 5 MG 24 hr tablet Take 5 mg by mouth 2 (two) times daily.    . Pyridoxine HCl (VITAMIN B-6) 500 MG tablet Take 500 mg by mouth daily.    . ranitidine (ZANTAC) 150 MG tablet TAKE 1 TABLET BY MOUTH TWO  TIMES DAILY 180 tablet 2  . rivaroxaban (XARELTO) 20 MG TABS tablet Take 1 tablet (20 mg total) by mouth daily with supper. 30 tablet 0  . tamsulosin (FLOMAX) 0.4 MG CAPS capsule Take 0.4 mg by mouth daily.     . timolol (TIMOPTIC) 0.5 % ophthalmic solution Place 1 drop into both eyes 2 (two) times daily.       No current facility-administered medications for this visit.     Allergies:   Patient has no known allergies.    Social History:  The patient  reports that he has never smoked. He does not have any smokeless tobacco history on file. He reports that he does not drink alcohol or use drugs.   Family History:  The patient's family history includes Cancer in his father; Coronary artery disease in his mother; Heart disease in his mother; Pancreatic cancer in  his father.    ROS:  Please see the history of present illness. All other systems are reviewed and negative.    PHYSICAL EXAM: VS:  BP 125/83 (BP Location: Left Arm, Patient Position: Sitting, Cuff Size: Normal)   Pulse (!) 54   Ht 6\' 5"  (1.956 m)   Wt 205 lb (93 kg)   BMI 24.31 kg/m  , BMI Body mass index is 24.31 kg/m. GEN: Well nourished, well developed, male in no acute distress  HEENT: normal for age  Neck: minimal JVD, no carotid bruit, no masses Cardiac: Irreg R&R; no murmur, no rubs, or gallops Respiratory:  clear to auscultation bilaterally, normal work of breathing GI: soft, nontender, nondistended, + BS MS: no deformity or atrophy; Trace edema above the sock line; distal pulses are 2+ in all 4 extremities   Skin: warm and dry, no rash Neuro:  Strength and sensation are intact; intentional tremor noted Psych: euthymic mood, full affect   EKG:  EKG is not ordered today.  ECHO: 04/08/2016 - Left ventricle: The cavity size was normal. Wall thickness was   normal. Systolic function was normal. The estimated ejection   fraction was in the range of 55% to 60%. Wall motion was normal;   there were no regional wall motion abnormalities. The study is   not technically sufficient to allow evaluation of LV diastolic   function. - Aortic valve: There was trivial regurgitation. - Mitral valve: There was mild regurgitation. - Left atrium: The atrium was severely dilated. - Right atrium: The atrium was severely dilated. - Atrial septum: No defect or patent foramen ovale was identified. - Tricuspid valve: There was moderate regurgitation.  Recent Labs: 11/28/2015: ALT 15; TSH 4.17 03/22/2016: Hemoglobin 14.6; Platelets 184 04/04/2016: BUN 16; Creatinine, Ser 0.97; Potassium 4.1; Sodium 139    Lipid Panel    Component Value Date/Time   CHOL 130 11/28/2015 1006   TRIG 81.0 11/28/2015 1006   HDL 39.80 11/28/2015 1006   CHOLHDL 3 11/28/2015 1006   VLDL 16.2 11/28/2015 1006     LDLCALC 74 11/28/2015 1006     Wt Readings from Last 3 Encounters:  04/15/16 205 lb (93 kg)  03/27/16 204 lb 9.6 oz (92.8 kg)  03/26/16 207 lb 14.4 oz (94.3 kg)     Other studies Reviewed: Additional studies/ records that were reviewed today include: office notes and testing.  ASSESSMENT AND PLAN:  1. Persisten Atrial fib: Pt does not have symptoms from this. His atria are severely enlarged, doubtful he could maintain SR. He is asymptomatic and anticoagulated. No rate control rx due to baseline bradycardia.  EF is normal on echo.  2. Bradycardia: explained to pt that he has some low HR (in the 20s) while asleep, 3.2 sec pause was at 5:30 am, also likely while sleeping. He is encouraged to watch for sx of presyncope or fatigue and contact us if he gets them.  3. Chronic anticoag: CHA2DS2VASc=3 (age x 2, HTN). Continue Xarelto. Discussed options, he will continue Xarelto  4. HTN/LE edema: improved on HCTZ, mild edema may be from sleeping in elastic socks. Encouraged him to wear knee-high compression socks, but only during the day.   Current medicines are reviewed at length with the patient today.  The patient has concerns regarding medicines. Concerns were addressed.  The following changes have been made:  no change  Labs/ tests ordered today include:  No orders of the defined types were placed in this encounter.    Disposition:   FU with Dr Martinique or myself in 1 year.  Augusto Garbe  04/15/2016 8:07 AM    [place] Phone: [phone]; Fax: [phone]  This note was written with the assistance of speech recognition software. Please excuse any transcriptional errors.

## 2016-04-15 NOTE — Addendum Note (Signed)
Addended by: Fidel Levy on: 04/15/2016 10:58 AM   Modules accepted: Orders

## 2016-04-15 NOTE — Telephone Encounter (Signed)
Rx(s) sent to pharmacy electronically.  

## 2016-04-15 NOTE — Patient Instructions (Signed)
Your physician recommends that you schedule a follow-up appointment in: 1 YEAR with Dr. Martinique  Please call us if you begin having or experience symptoms: lightheadedness, dizziness, fatigue.   DO NOT SLEEP in compression stockings.  Knee high compression stockings are recommended.

## 2016-04-16 ENCOUNTER — Telehealth: Payer: Self-pay | Admitting: Cardiology

## 2016-04-16 DIAGNOSIS — N3281 Overactive bladder: Secondary | ICD-10-CM | POA: Diagnosis not present

## 2016-04-16 DIAGNOSIS — N401 Enlarged prostate with lower urinary tract symptoms: Secondary | ICD-10-CM | POA: Diagnosis not present

## 2016-04-16 MED ORDER — HYDROCHLOROTHIAZIDE 12.5 MG PO CAPS: 12.5000 mg | ORAL_CAPSULE | Freq: Every day | ORAL | 3 refills | Status: DC

## 2016-04-16 NOTE — Telephone Encounter (Signed)
New Message  Pt c/o medication issue:  1. Name of Medication: hydrochlorothiazide (Microzide) 12.5 mg once daily  2. How are you currently taking this medication (dosage and times per day)? See above  3. Are you having a reaction (difficulty breathing--STAT)? N/A  4. What is your medication issue? Pt voiced wanting to know if he needs to continue this medication, if so to send it to [facility] as a 90 day supply   *STAT* If patient is at the pharmacy, call can be transferred to refill team.   1. Which medications need to be refilled? (please list name of each medication and dose if known) hydrochlorothiazide (Microzide) 12.5 mg once daily  2. Which pharmacy/location (including street and city if local pharmacy) is medication to be sent to? [facility]  3. Do they need a 30 day or 90 day supply? 90 day supply if needs to continue to take.

## 2016-04-16 NOTE — Telephone Encounter (Signed)
Returned call to patient 90 day refill for HCTZ sent to [facility].

## 2016-04-25 ENCOUNTER — Emergency Department (HOSPITAL_COMMUNITY)
Admission: EM | Admit: 2016-04-25 | Discharge: 2016-04-25 | Disposition: A | Discharge status: Home / Self Care | Payer: Medicare Other | Source: Home / Self Care | Attending: Emergency Medicine | Admitting: Emergency Medicine

## 2016-04-25 ENCOUNTER — Encounter (HOSPITAL_COMMUNITY): Payer: Self-pay | Admitting: Emergency Medicine

## 2016-04-25 ENCOUNTER — Emergency Department (HOSPITAL_COMMUNITY)
Admission: EM | Admit: 2016-04-25 | Discharge: 2016-04-25 | Disposition: A | Discharge status: Home / Self Care | Payer: Medicare Other | Attending: Emergency Medicine | Admitting: Emergency Medicine

## 2016-04-25 ENCOUNTER — Encounter (HOSPITAL_COMMUNITY): Payer: Self-pay

## 2016-04-25 DIAGNOSIS — S61412A Laceration without foreign body of left hand, initial encounter: Secondary | ICD-10-CM | POA: Insufficient documentation

## 2016-04-25 DIAGNOSIS — X58XXXD Exposure to other specified factors, subsequent encounter: Secondary | ICD-10-CM | POA: Insufficient documentation

## 2016-04-25 DIAGNOSIS — Y939 Activity, unspecified: Secondary | ICD-10-CM | POA: Insufficient documentation

## 2016-04-25 DIAGNOSIS — W228XXA Striking against or struck by other objects, initial encounter: Secondary | ICD-10-CM | POA: Insufficient documentation

## 2016-04-25 DIAGNOSIS — Z8546 Personal history of malignant neoplasm of prostate: Secondary | ICD-10-CM

## 2016-04-25 DIAGNOSIS — Y929 Unspecified place or not applicable: Secondary | ICD-10-CM | POA: Insufficient documentation

## 2016-04-25 DIAGNOSIS — S61412D Laceration without foreign body of left hand, subsequent encounter: Secondary | ICD-10-CM | POA: Diagnosis not present

## 2016-04-25 DIAGNOSIS — Z7901 Long term (current) use of anticoagulants: Secondary | ICD-10-CM

## 2016-04-25 DIAGNOSIS — Y999 Unspecified external cause status: Secondary | ICD-10-CM | POA: Insufficient documentation

## 2016-04-25 DIAGNOSIS — Z7982 Long term (current) use of aspirin: Secondary | ICD-10-CM | POA: Insufficient documentation

## 2016-04-25 DIAGNOSIS — I10 Essential (primary) hypertension: Secondary | ICD-10-CM

## 2016-04-25 DIAGNOSIS — E039 Hypothyroidism, unspecified: Secondary | ICD-10-CM | POA: Insufficient documentation

## 2016-04-25 HISTORY — DX: Unspecified atrial fibrillation: I48.91

## 2016-04-25 MED ORDER — BACITRACIN ZINC 500 UNIT/GM EX OINT
TOPICAL_OINTMENT | CUTANEOUS | Status: AC
  Administered 2016-04-25: 1
  Filled 2016-04-25: qty 0.9

## 2016-04-25 MED ORDER — LIDOCAINE-EPINEPHRINE (PF) 2 %-1:200000 IJ SOLN
20.0000 mL | Freq: Once | INTRAMUSCULAR | Status: AC
  Administered 2016-04-25: 20 mL via INTRADERMAL
  Filled 2016-04-25: qty 20

## 2016-04-25 NOTE — ED Triage Notes (Signed)
Pt reports continued intermittent bleeding from L hand. Was here for same this am. Bleeding controlled. On blood thinners

## 2016-04-25 NOTE — ED Provider Notes (Signed)
[facility] DEPT Provider Note   CSN: [id number] Arrival date & time: 04/25/16 1333     History    Chief Complaint  Patient presents with  . hand bleeding     HPI Daniel Everett is a 83 y.o. male.  83 year old male with past medical history below including anticoagulant use who presents with left hand laceration. The patient presented here earlier today for small wound on his dorsal left hand that would not stop bleeding. He does not recall specific trauma and he denies any significant pain. He had quick clot placed on the wound and it was dry at time of discharge but later when he got home it spontaneously began bleeding again and he cannot get it to stop. He denies any repeat trauma.   Past Medical History:  Diagnosis Date  . Allergic rhinitis due to pollen 06/07/2008  . Atrial fibrillation ([place])   . Benign essential HTN   . COLONIC POLYPS, HX OF 10/28/2006  . DIVERTICULOSIS, COLON 11/27/2006  . GEN OSTEOARTHROSIS INVOLVING MULTIPLE SITES 01/03/2010  . HYPERGLYCEMIA 10/18/2008  . HYPOTHYROIDISM 11/27/2006  . NEOPLASM, MALIGNANT, PROSTATE, HX OF, S/P TURP 05/28/2007  . OSTEOARTHRITIS 11/27/2006  . WEIGHT LOSS, RECENT 11/27/2006     Patient Active Problem List   Diagnosis Date Noted  . Atrial fibrillation ([place]) 03/26/2016  . GERD (gastroesophageal reflux disease) 02/08/2015  . Nocturia 02/08/2015  . Preventative health care 05/04/2010  . GEN OSTEOARTHROSIS INVOLVING MULTIPLE SITES 01/03/2010  . ONYCHOMYCOSIS 06/06/2009  . HOARSENESS, CHRONIC 10/18/2008  . 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 ALLERGIC RHINITIS DUE TO POLLEN 06/07/2008  . NEOPLASM, MALIGNANT, PROSTATE, HX OF, S/P TURP 05/28/2007  . Hypothyroidism 11/27/2006  . DIVERTICULOSIS, COLON 11/27/2006  . Osteoarthritis 11/27/2006  . Hyperlipidemia 10/28/2006  . Essential hypertension 10/28/2006  . History of colonic polyps 10/28/2006    Past Surgical History:  Procedure Laterality Date  . arthroscopy rt  knee    . HYDROCELE EXCISION / REPAIR    . PROSTATE SURGERY     TURP  . ROTATOR CUFF REPAIR          Home Medications    Prior to Admission medications   Medication Sig Start Date End Date Taking? Authorizing Provider  acetaminophen (TYLENOL) 500 MG tablet Take 500 mg by mouth 2 (two) times daily. Take one tablet twice a day.     Historical Provider, MD  amLODipine (NORVASC) 5 MG tablet TAKE 1 TABLET BY MOUTH  DAILY 01/16/16   [name], MD  aspirin 81 MG tablet Take 81 mg by mouth daily.      Historical Provider, MD  atorvastatin (LIPITOR) 20 MG tablet TAKE 1 TABLET BY MOUTH  DAILY 12/22/15   [name], MD  finasteride (PROSCAR) 5 MG tablet TAKE 1 TABLET BY MOUTH  DAILY 12/22/15   [name], MD  hydrochlorothiazide (MICROZIDE) 12.5 MG capsule Take 1 capsule (12.5 mg total) by mouth daily. 04/16/16 07/15/16  Peter M Martinique, MD  latanoprost (XALATAN) 0.005 % ophthalmic solution Place 1 drop into both eyes at bedtime.     Historical Provider, MD  levothyroxine (SYNTHROID, LEVOTHROID) 50 MCG tablet TAKE 1 TABLET BY MOUTH  DAILY 12/22/15   [name], MD  lisinopril (PRINIVIL,ZESTRIL) 20 MG tablet TAKE 1 TABLET BY MOUTH  DAILY 12/22/15   [name], MD  Multiple Vitamin (MULTIVITAMIN) tablet Take 1 tablet by mouth daily.      Historical Provider, MD  oxybutynin (DITROPAN-XL) 5 MG 24  hr tablet Take 5 mg by mouth 2 (two) times daily.    Historical Provider, MD  Pyridoxine HCl (VITAMIN B-6) 500 MG tablet Take 500 mg by mouth daily.    Historical Provider, MD  ranitidine (ZANTAC) 150 MG tablet TAKE 1 TABLET BY MOUTH TWO  TIMES DAILY 01/16/16   [name], MD  rivaroxaban (XARELTO) 20 MG TABS tablet Take 1 tablet (20 mg total) by mouth daily with supper. 04/15/16   Peter M Martinique, MD  rivaroxaban (XARELTO) 20 MG TABS tablet Take 1 tablet (20 mg total) by mouth daily with supper. 04/15/16   Peter M Martinique, MD  tamsulosin (FLOMAX) 0.4 MG CAPS capsule Take 0.4  mg by mouth daily.     Historical Provider, MD  timolol (TIMOPTIC) 0.5 % ophthalmic solution Place 1 drop into both eyes 2 (two) times daily.      Historical Provider, MD      Family History  Problem Relation Age of Onset  . Coronary artery disease Mother   . Heart disease Mother   . Pancreatic cancer Father   . Cancer Father      Social History  Substance Use Topics  . Smoking status: Never Smoker  . Smokeless tobacco: Never Used  . Alcohol use No     Allergies     Patient has no known allergies.    Review of Systems  10 Systems reviewed and are negative for acute change except as noted in the HPI.   Physical Exam Updated Vital Signs BP 136/82 (BP Location: Right Arm)   Pulse 66   Resp 19   SpO2 99%   Physical Exam  Constitutional: He is oriented to person, place, and time. He appears well-developed and well-nourished. No distress.  HENT:  Head: Normocephalic and atraumatic.  Eyes: Conjunctivae are normal.  Neck: Neck supple.  Cardiovascular: Intact distal pulses.   Musculoskeletal: He exhibits no edema or deformity.  0.3cm superficial laceration dorsal L hand near 4th MCP joint, oozing blood, no pulsatile bleeding or rapidly expanding hematoma  Neurological: He is alert and oriented to person, place, and time. No sensory deficit.  Skin: Skin is warm and dry.  Psychiatric: He has a normal mood and affect. Judgment normal.  Nursing note and vitals reviewed.     ED Treatments / Results  Labs (all labs ordered are listed, but only abnormal results are displayed) Labs Reviewed - No data to display   EKG  EKG Interpretation  Date/Time:    Ventricular Rate:    PR Interval:    QRS Duration:   QT Interval:    QTC Calculation:   R Axis:     Text Interpretation:           Radiology No results found.  Procedures Procedures (including critical care time) .Marland KitchenLaceration Repair Date/Time: 04/25/2016 5:20 PM Performed by: Sharlett Iles Authorized by: Sharlett Iles   Consent:    Consent obtained:  Verbal   Consent given by:  Patient Anesthesia (see MAR for exact dosages):    Anesthesia method:  Local infiltration   Local anesthetic:  Lidocaine 2% WITH epi Laceration details:    Location:  Hand   Hand location:  L hand, dorsum   Length (cm):  0.3 Repair type:    Repair type:  Simple Pre-procedure details:    Preparation:  Patient was prepped and draped in usual sterile fashion Exploration:    Hemostasis achieved with:  Epinephrine Treatment:    Area cleansed  with:  Betadine   Amount of cleaning:  Standard   Irrigation solution:  Sterile saline   Irrigation method:  Syringe Skin repair:    Repair method:  Sutures   Suture size:  4-0   Suture material:  Nylon   Suture technique:  Simple interrupted   Number of sutures:  2 Approximation:    Approximation:  Close Post-procedure details:    Dressing:  Antibiotic ointment and non-adherent dressing   Patient tolerance of procedure:  Tolerated well, no immediate complications    Medications Ordered in ED  Medications  lidocaine-EPINEPHrine (XYLOCAINE W/EPI) 2 %-1:200000 (PF) injection 20 mL (20 mLs Intradermal Given 04/25/16 1608)  bacitracin 500 UNIT/GM ointment (1 application  Given [id number])     Initial Impression / Assessment and Plan / ED Course  I have reviewed the triage vital signs and the nursing notes.      Patient back for ongoing bleeding of small dorsal hand laceration. No arterial bleeding on exam just continually using likely because the patient is on anticoagulation. Injected with lidocaine with epinephrine and then placed 2 sutures which achieved hemostasis successfully. Wrapped in mild pressure dressing and instructed to keep elevated. Reviewed return precautions.  Final Clinical Impressions(s) / ED Diagnoses   Final diagnoses:  Laceration of left hand without foreign body, subsequent encounter     Discharge  Medication List as of 04/25/2016  4:10 PM         Sharlett Iles, MD 04/25/16 1721

## 2016-04-25 NOTE — ED Triage Notes (Signed)
Pt c/o laceration to L finger x 1 day.  Denies pain.  Pt reports "I must have hit it on something."  Pt report intermittent bleeding.  Pt is on blood thinners.  Bleeding is currently controlled.

## 2016-04-25 NOTE — Discharge Instructions (Signed)
Wound Seal MD Powder quickly forms a seal that stops bleeding and provides a microbial barrier to protect the site. The protective seal stays on the wound until it is healed, then falls off naturally.   1. Keep the wound site dry and covered with a non-adherent dressing for approximately 48 hours. 2. After 48 hours, remove the dressing. The area may get wet, but do not scrub or wash the powder from the wound. As the wound begins to heal, the powder will fall off naturally. The wound should be covered daily with a dressing for 7-10 days or longer as needed.  3. As the powder begins to fall off, apply clean Vaseline or Aquaphor ointment to the site to prevent the wound from drying out and forming a thick scab. Studies have shown that wounds heal faster and better when kept moist.

## 2016-04-25 NOTE — ED Notes (Signed)
Bed: WTR5 Expected date:  Expected time:  Means of arrival:  Comments: 

## 2016-04-25 NOTE — ED Provider Notes (Signed)
[facility] DEPT Provider Note   CSN: [id number] Arrival date & time: 04/25/16  [id number]  By signing my name below, I, Sonum Patel, attest that this documentation has been prepared under the direction and in the presence of [employer], PA-C. Electronically Signed: Sonum Patel, Education administrator. 04/25/16. 10:38 AM.  History   Chief Complaint Chief Complaint  Patient presents with  . Finger Laceration    The history is provided by the patient. No language interpreter was used.     HPI Comments: Daniel Everett is a 83 y.o. male who presents to the Emergency Department complaining of a laceration to the dorsal aspect of left hand that occurred yesterday afternoon. He is unsure how this injury occurred; states he looked at his hand and noticed the bleeding. He has covered the area and applied pressure with paper towels. He currently takes Xarelto for A fib. He denies any other associated symptoms at this time.   Past Medical History:  Diagnosis Date  . Allergic rhinitis due to pollen 06/07/2008  . Atrial fibrillation ([place])   . Benign essential HTN   . COLONIC POLYPS, HX OF 10/28/2006  . DIVERTICULOSIS, COLON 11/27/2006  . GEN OSTEOARTHROSIS INVOLVING MULTIPLE SITES 01/03/2010  . HYPERGLYCEMIA 10/18/2008  . HYPOTHYROIDISM 11/27/2006  . NEOPLASM, MALIGNANT, PROSTATE, HX OF, S/P TURP 05/28/2007  . OSTEOARTHRITIS 11/27/2006  . WEIGHT LOSS, RECENT 11/27/2006    Patient Active Problem List   Diagnosis Date Noted  . Atrial fibrillation ([place]) 03/26/2016  . GERD (gastroesophageal reflux disease) 02/08/2015  . Nocturia 02/08/2015  . Preventative health care 05/04/2010  . GEN OSTEOARTHROSIS INVOLVING MULTIPLE SITES 01/03/2010  . ONYCHOMYCOSIS 06/06/2009  . HOARSENESS, CHRONIC 10/18/2008  . 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 ALLERGIC RHINITIS DUE TO POLLEN 06/07/2008  . NEOPLASM, MALIGNANT, PROSTATE, HX OF, S/P TURP 05/28/2007  . Hypothyroidism 11/27/2006  . DIVERTICULOSIS, COLON 11/27/2006  .  Osteoarthritis 11/27/2006  . Hyperlipidemia 10/28/2006  . Essential hypertension 10/28/2006  . History of colonic polyps 10/28/2006    Past Surgical History:  Procedure Laterality Date  . arthroscopy rt knee    . HYDROCELE EXCISION / REPAIR    . PROSTATE SURGERY     TURP  . ROTATOR CUFF REPAIR         Home Medications    Prior to Admission medications   Medication Sig Start Date End Date Taking? Authorizing Provider  acetaminophen (TYLENOL) 500 MG tablet Take 500 mg by mouth 2 (two) times daily. Take one tablet twice a day.     Historical Provider, MD  amLODipine (NORVASC) 5 MG tablet TAKE 1 TABLET BY MOUTH  DAILY 01/16/16   [name], MD  aspirin 81 MG tablet Take 81 mg by mouth daily.      Historical Provider, MD  atorvastatin (LIPITOR) 20 MG tablet TAKE 1 TABLET BY MOUTH  DAILY 12/22/15   [name], MD  finasteride (PROSCAR) 5 MG tablet TAKE 1 TABLET BY MOUTH  DAILY 12/22/15   [name], MD  hydrochlorothiazide (MICROZIDE) 12.5 MG capsule Take 1 capsule (12.5 mg total) by mouth daily. 04/16/16 07/15/16  Peter M Martinique, MD  latanoprost (XALATAN) 0.005 % ophthalmic solution Place 1 drop into both eyes at bedtime.     Historical Provider, MD  levothyroxine (SYNTHROID, LEVOTHROID) 50 MCG tablet TAKE 1 TABLET BY MOUTH  DAILY 12/22/15   [name], MD  lisinopril (PRINIVIL,ZESTRIL) 20 MG tablet TAKE 1 TABLET BY MOUTH  DAILY 12/22/15   [name],  MD  Multiple Vitamin (MULTIVITAMIN) tablet Take 1 tablet by mouth daily.      Historical Provider, MD  oxybutynin (DITROPAN-XL) 5 MG 24 hr tablet Take 5 mg by mouth 2 (two) times daily.    Historical Provider, MD  Pyridoxine HCl (VITAMIN B-6) 500 MG tablet Take 500 mg by mouth daily.    Historical Provider, MD  ranitidine (ZANTAC) 150 MG tablet TAKE 1 TABLET BY MOUTH TWO  TIMES DAILY 01/16/16   [name], MD  rivaroxaban (XARELTO) 20 MG TABS tablet Take 1 tablet (20 mg total) by mouth daily with supper.  04/15/16   Peter M Martinique, MD  rivaroxaban (XARELTO) 20 MG TABS tablet Take 1 tablet (20 mg total) by mouth daily with supper. 04/15/16   Peter M Martinique, MD  tamsulosin (FLOMAX) 0.4 MG CAPS capsule Take 0.4 mg by mouth daily.     Historical Provider, MD  timolol (TIMOPTIC) 0.5 % ophthalmic solution Place 1 drop into both eyes 2 (two) times daily.      Historical Provider, MD    Family History Family History  Problem Relation Age of Onset  . Coronary artery disease Mother   . Heart disease Mother   . Pancreatic cancer Father   . Cancer Father     Social History Social History  Substance Use Topics  . Smoking status: Never Smoker  . Smokeless tobacco: Never Used  . Alcohol use No     Allergies   Patient has no known allergies.   Review of Systems Review of Systems  Skin: Positive for wound.  Neurological: Negative for weakness and numbness.     Physical Exam Updated Vital Signs BP 123/89 (BP Location: Right Arm)   Pulse (!) 56   Temp 97.5 F (36.4 C) (Oral)   Resp 13   Ht 6\' 5"  (1.956 m)   Wt 200 lb (90.7 kg)   SpO2 100%   BMI 23.72 kg/m   Physical Exam  Constitutional: He is oriented to person, place, and time. He appears well-developed and well-nourished.  HENT:  Head: Normocephalic and atraumatic.  Cardiovascular: Normal rate.   Pulmonary/Chest: Effort normal.  Musculoskeletal: Normal range of motion. He exhibits no deformity.  Neurological: He is alert and oriented to person, place, and time.  Skin: Skin is warm and dry. Laceration noted.  2 cm x 2 cm skin tear over dorsal aspect of left hand. Blood oozing from small area   Psychiatric: He has a normal mood and affect.  Nursing note and vitals reviewed.    ED Treatments / Results  DIAGNOSTIC STUDIES: Oxygen Saturation is 100% on RA, normal by my interpretation.    COORDINATION OF CARE: 10:36 AM Discussed treatment plan with pt at bedside and pt agreed to plan.   Labs (all labs ordered are listed,  but only abnormal results are displayed) Labs Reviewed - No data to display  EKG  EKG Interpretation None       Radiology No results found.  Procedures Procedures (including critical care time)  Medications Ordered in ED Medications - No data to display   Initial Impression / Assessment and Plan / ED Course  I have reviewed the triage vital signs and the nursing notes.  Pertinent labs & imaging results that were available during my care of the patient were reviewed by me and considered in my medical decision making (see chart for details).  83 year old male presents with uncontrolled bleeding from a skin tear since yesterday. Wound was  thoroughly irrigated. Velora Mediate was applied and hemostasis was achieved. Bandage was applied and wound care instructions were discussed.  Nursing staff notified me that pt's HR was 48. He is asymptomatic. Has hx of bradycardia and A. Fib and saw his cardiologist for this last week who recommended 1 year follow up unless he was symptomatic. [place] for d/c.  Final Clinical Impressions(s) / ED Diagnoses   Final diagnoses:  Skin tear of left hand without complication, initial encounter    New Prescriptions New Prescriptions   No medications on file   I personally performed the services described in this documentation, which was scribed in my presence. The recorded information has been reviewed and is accurate.     Recardo Evangelist, PA-C 04/25/16 [place], MD 04/27/16 [phone]

## 2016-05-03 ENCOUNTER — Encounter: Payer: Self-pay | Admitting: Family Medicine

## 2016-05-03 ENCOUNTER — Ambulatory Visit (INDEPENDENT_AMBULATORY_CARE_PROVIDER_SITE_OTHER): Payer: Medicare Other | Admitting: Family Medicine

## 2016-05-03 VITALS — BP 120/80 | HR 50 | Temp 97.3°F | Ht 77.0 in | Wt 208.4 lb

## 2016-05-03 DIAGNOSIS — I480 Paroxysmal atrial fibrillation: Secondary | ICD-10-CM

## 2016-05-03 DIAGNOSIS — E038 Other specified hypothyroidism: Secondary | ICD-10-CM

## 2016-05-03 DIAGNOSIS — S61412D Laceration without foreign body of left hand, subsequent encounter: Secondary | ICD-10-CM | POA: Diagnosis not present

## 2016-05-03 DIAGNOSIS — R2689 Other abnormalities of gait and mobility: Secondary | ICD-10-CM | POA: Diagnosis not present

## 2016-05-03 NOTE — Progress Notes (Signed)
Subjective:     Patient ID: Daniel Everett, male   DOB: Jul 01, 1931, 83 y.o.   MRN: [id number]  HPI Patient seen for several issues as follows:  Recent left hand injury. He does not recall how he injured his hand. This apparently occurred on 04/24/16. He had some bleeding over the fourth knuckle. He takes blood thinner with Xarelto for atrial fibrillation.  He went on February 1 to the ER because some persistent bleeding from the left hand wound and was given woundseal-but bleeding persisted and he returned later that day to ER and 2 sutures were placed. That did stop his bleeding. He had tetanus 2014. He's had no bleeding issues since then.  Patient has hypothyroidism. He had recent labs to the [place] with TSH of 6.13. He states he is compliant with taking his levothyroxin 50 g daily. Last TSH year back in September was normal range at slightly over 4.  He denies any recent falls. He does state that his balance is somewhat less than it has been previously. He plans to start silver sneaker program soon. He has not had any syncope or extreme dizziness.  Past Medical History:  Diagnosis Date  . Allergic rhinitis due to pollen 06/07/2008  . Atrial fibrillation ([place])   . Benign essential HTN   . COLONIC POLYPS, HX OF 10/28/2006  . DIVERTICULOSIS, COLON 11/27/2006  . GEN OSTEOARTHROSIS INVOLVING MULTIPLE SITES 01/03/2010  . HYPERGLYCEMIA 10/18/2008  . HYPOTHYROIDISM 11/27/2006  . NEOPLASM, MALIGNANT, PROSTATE, HX OF, S/P TURP 05/28/2007  . OSTEOARTHRITIS 11/27/2006  . WEIGHT LOSS, RECENT 11/27/2006   Past Surgical History:  Procedure Laterality Date  . arthroscopy rt knee    . HYDROCELE EXCISION / REPAIR    . PROSTATE SURGERY     TURP  . ROTATOR CUFF REPAIR      reports that he has never smoked. He has never used smokeless tobacco. He reports that he does not drink alcohol or use drugs. family history includes Cancer in his father; Coronary artery disease in his mother; Heart disease in his mother;  Pancreatic cancer in his father. No Known Allergies   Review of Systems  Constitutional: Negative for fatigue.  Eyes: Negative for visual disturbance.  Respiratory: Negative for cough, chest tightness and shortness of breath.   Cardiovascular: Negative for chest pain, palpitations and leg swelling.  Gastrointestinal: Negative for abdominal pain.  Neurological: Positive for tremors. Negative for dizziness, syncope, weakness, light-headedness and headaches.  Psychiatric/Behavioral: Negative for confusion.       Objective:   Physical Exam  Constitutional: He is oriented to person, place, and time. He appears well-developed and well-nourished.  Cardiovascular:  Regular rhythm with mild bradycardia with heart rate around 50-54 which is apparently his baseline  Pulmonary/Chest: Effort normal and breath sounds normal. No respiratory distress. He has no wheezes. He has no rales.  Musculoskeletal: He exhibits no edema.  Neurological: He is alert and oriented to person, place, and time.  Skin:  He has very small wound approximately 1 cm over the left fourth MCP joint. Only one suture in place and apparently one suture came out. Small eschar. No signs of secondary infection. Healing well.  Psychiatric: He has a normal mood and affect. His behavior is normal. Thought content normal.       Assessment:     #1 recent laceration left hand here for suture removal. 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 Recent mildly  elevated T[facility]  #4 mild balance issues    Plan:     -has follow up in March and recheck TSH then. If TSH elevated at that point increased levothyroxine -One suture removed from left hand without difficulty (one had already fallen out) -Patient plans to start silver sneakers program soon. We discussed possible physical therapy for some balance training and fall risk reduction but he  would like to try Silver sneakers first.  [name] MD [facility]

## 2016-05-03 NOTE — Patient Instructions (Signed)
We will plan on repeat blood test to check thyroid at March follow up. Recommend start back [employer].

## 2016-05-03 NOTE — Progress Notes (Signed)
Pre visit review using our clinic review tool, if applicable. No additional management support is needed unless otherwise documented below in the visit note. 

## 2016-05-08 ENCOUNTER — Telehealth: Payer: Self-pay | Admitting: *Deleted

## 2016-05-08 MED ORDER — RIVAROXABAN 20 MG PO TABS: 20.0000 mg | ORAL_TABLET | Freq: Every day | ORAL | 3 refills | Status: DC

## 2016-05-08 NOTE — Telephone Encounter (Signed)
Patient walked in requesting office note and rx for Xarelto be faxed to [facility] for them to cover his Xarelto Fax number [phone] Attn: Dr Herold Harms

## 2016-05-10 ENCOUNTER — Telehealth: Payer: Self-pay | Admitting: *Deleted

## 2016-05-10 NOTE — Telephone Encounter (Signed)
Patient walked into the office requesting samples of xarelto. Samples given. [id number] exp 3/20?20 3 bottles given.

## 2016-05-10 NOTE — Telephone Encounter (Signed)
Faxed on 2/14, confirmation received Information given to [facility]  Patient aware

## 2016-05-15 ENCOUNTER — Telehealth: Payer: Self-pay | Admitting: *Deleted

## 2016-05-15 NOTE — Telephone Encounter (Signed)
Received patient as a walk in-left message for triage nurse:   "patient is getting xarelto per Suanne Marker from the [place] however, now [place] has recommended he get Eliquis instead"  Attempt to call patient- VM full on cell phone unable to leave message, attempt to call house phone-no answer, lmtcb.

## 2016-05-16 MED ORDER — APIXABAN 5 MG PO TABS: 5.0000 mg | ORAL_TABLET | Freq: Two times a day (BID) | ORAL | 11 refills | Status: DC

## 2016-05-16 NOTE — Telephone Encounter (Signed)
Returned call to patient. He states that the [place] has recommended that he get Eliquis instead of Xarelto - unclear on reasoning. There is documentation supporting that this has been an ongoing process in trying to get Xarelto approved - unsuccessful. Patient would be willing to make change as long as cardiology is on board with this.   Patient states is OK to fax an Rx to Dr. Herold Harms @ [facility] (fax number: [phone]) - per previous documentation   Patient aware that clinical pharmacy staff can make med change regarding [facility]. He would also like Dr. Martinique to be made aware of medication issue.   Message routed to MD & RPH(s)

## 2016-05-16 NOTE — Telephone Encounter (Signed)
Agree  Kanton Kamel MD, FACC   

## 2016-05-16 NOTE — Telephone Encounter (Signed)
Okay to change Xarelto 20mg  daily to Eliquis 5mg  BID.  Recent labs results noted:  Age = 28  SCr =0.9  H/H = within normal limits  LFT = WNL  Rx for Eliquis 5mg  BID sent to Dr Rondell Reams at [phone] as requested.  Prescription signed by Dr Martinique

## 2016-05-16 NOTE — Telephone Encounter (Signed)
New Message     Please call returning Daniel Everett's call about medication change  Cell if you dont reach him at home [phone]

## 2016-05-17 ENCOUNTER — Telehealth: Payer: Self-pay | Admitting: Physician Assistant

## 2016-05-17 NOTE — Telephone Encounter (Signed)
Returned call to patient and communicated to him that Dr. Martinique agreed that xarelto can be changed to eliquis 5mg  twice daily and that Rx was sent to Dr. Rondell Reams yesterday.

## 2016-05-17 NOTE — Telephone Encounter (Signed)
Patient is returning call, to cheryl. Thanks.

## 2016-05-17 NOTE — Telephone Encounter (Signed)
Called patient no answer.LMTC. 

## 2016-05-20 NOTE — Telephone Encounter (Signed)
Returned call to patient Dr.Jordan agreed ok to change xarelto to eliquis.

## 2016-05-28 ENCOUNTER — Ambulatory Visit: Payer: Medicare Other | Admitting: Family Medicine

## 2016-05-28 DIAGNOSIS — Z0289 Encounter for other administrative examinations: Secondary | ICD-10-CM

## 2016-06-25 ENCOUNTER — Encounter: Payer: Self-pay | Admitting: Family Medicine

## 2016-06-25 ENCOUNTER — Ambulatory Visit (INDEPENDENT_AMBULATORY_CARE_PROVIDER_SITE_OTHER): Payer: Medicare Other | Admitting: Family Medicine

## 2016-06-25 VITALS — BP 120/84 | HR 58 | Temp 97.4°F | Wt 204.6 lb

## 2016-06-25 DIAGNOSIS — I10 Essential (primary) hypertension: Secondary | ICD-10-CM | POA: Diagnosis not present

## 2016-06-25 DIAGNOSIS — I480 Paroxysmal atrial fibrillation: Secondary | ICD-10-CM | POA: Diagnosis not present

## 2016-06-25 DIAGNOSIS — G250 Essential tremor: Secondary | ICD-10-CM

## 2016-06-25 HISTORY — DX: Essential tremor: G25.0

## 2016-06-25 NOTE — Progress Notes (Signed)
Subjective:     Patient ID: Daniel Everett, male   DOB: 12/20/1931, 83 y.o.   MRN: [id number]  HPI Patient seen for medical follow-up. Recent diagnosis of atrial fibrillation. His rate has been controlled and he is now taking Eliquis 5 mg twice a day. No recent bleeding complications. His other chronic medical problems include history of BPH, remote history of prostate cancer, hypothyroidism, hyperlipidemia, GERD, dyslipidemia.  He had some recent issues with mild lower extremity edema. Is now using support hose and edema is improved. No orthopnea. No chest pains. He has noticed some gradual decline in strength lower extremities. He has membership to a gym but does not use this consistently.  Essential tremor. No major progression. Has minimized caffeine use. Not a good candidate for beta blocker because of his chronic bradycardia. He is not interested in further medications at this time.  Past Medical History:  Diagnosis Date  . Allergic rhinitis due to pollen 06/07/2008  . Atrial fibrillation ([place])   . Benign essential HTN   . COLONIC POLYPS, HX OF 10/28/2006  . DIVERTICULOSIS, COLON 11/27/2006  . GEN OSTEOARTHROSIS INVOLVING MULTIPLE SITES 01/03/2010  . HYPERGLYCEMIA 10/18/2008  . HYPOTHYROIDISM 11/27/2006  . NEOPLASM, MALIGNANT, PROSTATE, HX OF, S/P TURP 05/28/2007  . OSTEOARTHRITIS 11/27/2006  . WEIGHT LOSS, RECENT 11/27/2006   Past Surgical History:  Procedure Laterality Date  . arthroscopy rt knee    . HYDROCELE EXCISION / REPAIR    . PROSTATE SURGERY     TURP  . ROTATOR CUFF REPAIR      reports that he has never smoked. He has never used smokeless tobacco. He reports that he does not drink alcohol or use drugs. family history includes Cancer in his father; Coronary artery disease in his mother; Heart disease in his mother; Pancreatic cancer in his father. No Known Allergies   Review of Systems  Constitutional: Negative for fatigue.  Eyes: Negative for visual disturbance.   Respiratory: Negative for cough, chest tightness and shortness of breath.   Cardiovascular: Negative for chest pain, palpitations and leg swelling.  Neurological: Negative for dizziness, syncope, weakness, light-headedness and headaches.       Objective:   Physical Exam  Constitutional: He is oriented to person, place, and time. He appears well-developed and well-nourished.  HENT:  Right Ear: External ear normal.  Left Ear: External ear normal.  Mouth/Throat: Oropharynx is clear and moist.  Eyes: Pupils are equal, round, and reactive to light.  Neck: Neck supple. No thyromegaly present.  Cardiovascular:  Mildly bradycardic with irregular rhythm. Rate 50s  Pulmonary/Chest: Effort normal and breath sounds normal. No respiratory distress. He has no wheezes. He has no rales.  Musculoskeletal: He exhibits no edema.  Neurological: He is alert and oriented to person, place, and time.       Assessment:     #1 hypertension stable and at goal  #2 atrial fibrillation rate controlled and on chronic anticoagulant  #3 hypothyroidism  #4 essential tremor.  He does not have any clinical features to suggest Parkinson's disease    Plan:     -We discussed other possible treatments for essential tremor including Mysoline but at this point he is not interested in pursuing that. Would not start beta blocker with chronic bradycardia.   -Highly suggestive getting engaged in more regular exercise especially for quadriceps strengthening to help reduce fall risk -Continue other chronic medications -Follow-up in 6 months and obtain follow-up labs then  [name] MD [facility]  at [facility]

## 2016-06-25 NOTE — Patient Instructions (Signed)
Try to get some exercise going to strengthen your lower extremities such as exercise bike.

## 2016-06-25 NOTE — Progress Notes (Signed)
Pre visit review using our clinic review tool, if applicable. No additional management support is needed unless otherwise documented below in the visit note. 

## 2016-07-18 DIAGNOSIS — N3281 Overactive bladder: Secondary | ICD-10-CM | POA: Diagnosis not present

## 2016-07-18 DIAGNOSIS — N401 Enlarged prostate with lower urinary tract symptoms: Secondary | ICD-10-CM | POA: Diagnosis not present

## 2016-07-22 ENCOUNTER — Telehealth: Payer: Self-pay | Admitting: Cardiology

## 2016-07-22 MED ORDER — HYDROCHLOROTHIAZIDE 12.5 MG PO CAPS: 12.5000 mg | ORAL_CAPSULE | Freq: Every day | ORAL | 0 refills | Status: DC

## 2016-07-22 NOTE — Telephone Encounter (Signed)
Returned call to patient.30 day refill for HCTZ sent to [facility] on [address].

## 2016-07-22 NOTE — Telephone Encounter (Signed)
New message      Patient calling the office for samples of medication:   1.  What medication and dosage are you requesting samples for? HCTZ 12.5  2.  Are you currently out of this medication? Almost out.  Waiting for mailorder presc to come

## 2016-08-28 DIAGNOSIS — L570 Actinic keratosis: Secondary | ICD-10-CM | POA: Diagnosis not present

## 2016-08-28 DIAGNOSIS — L814 Other melanin hyperpigmentation: Secondary | ICD-10-CM | POA: Diagnosis not present

## 2016-08-28 DIAGNOSIS — D225 Melanocytic nevi of trunk: Secondary | ICD-10-CM | POA: Diagnosis not present

## 2016-08-28 DIAGNOSIS — Z85828 Personal history of other malignant neoplasm of skin: Secondary | ICD-10-CM | POA: Diagnosis not present

## 2016-08-28 DIAGNOSIS — L821 Other seborrheic keratosis: Secondary | ICD-10-CM | POA: Diagnosis not present

## 2016-09-02 ENCOUNTER — Ambulatory Visit (INDEPENDENT_AMBULATORY_CARE_PROVIDER_SITE_OTHER): Payer: Medicare Other | Admitting: Family Medicine

## 2016-09-02 ENCOUNTER — Encounter: Payer: Self-pay | Admitting: Family Medicine

## 2016-09-02 VITALS — BP 120/80 | HR 56 | Temp 98.1°F | Wt 198.6 lb

## 2016-09-02 DIAGNOSIS — I10 Essential (primary) hypertension: Secondary | ICD-10-CM

## 2016-09-02 DIAGNOSIS — R5383 Other fatigue: Secondary | ICD-10-CM

## 2016-09-02 DIAGNOSIS — E038 Other specified hypothyroidism: Secondary | ICD-10-CM | POA: Diagnosis not present

## 2016-09-02 DIAGNOSIS — I480 Paroxysmal atrial fibrillation: Secondary | ICD-10-CM

## 2016-09-02 DIAGNOSIS — G250 Essential tremor: Secondary | ICD-10-CM

## 2016-09-02 NOTE — Progress Notes (Signed)
Subjective:     Patient ID: Daniel Everett, male   DOB: 05-25-1931, 83 y.o.   MRN: [id number]  HPI Patient here for routine medical follow-up. He has multiple chronic problems occluding history of BPH, essential tremor, hypertension, atrial fibrillation, hypothyroidism, hyperlipidemia, osteoarthritis-especially the right knee.  His wife is in a nursing home and he continues to visit her basically daily. 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 He tries to exercise a few days per week. 85rd birthday coming up next week.  Still maintains all of his yard work at home. Sometimes feels "off balance ". No syncope. Denies any postural dizziness. No ataxia. Symptoms are inconsistent. No focal weakness.  Brings in blood pressures for review. These are consistently good. He had occasional systolic below 350 but has never had any dizziness with this. He has occasional heart rate down in the 40s but again no syncope or dizziness. He apparently had some bradycardia chronically for years. History of atrial fibrillation on anticoagulant. Followed by cardiology. Compliant with all medications.  History of BPH and symptomatically stable on combination therapy with tamsulosin and finasteride  Past Medical History:  Diagnosis Date  . Allergic rhinitis due to pollen 06/07/2008  . Atrial fibrillation ([place])   . Benign essential HTN   . COLONIC POLYPS, HX OF 10/28/2006  . DIVERTICULOSIS, COLON 11/27/2006  . GEN OSTEOARTHROSIS INVOLVING MULTIPLE SITES 01/03/2010  . HYPERGLYCEMIA 10/18/2008  . HYPOTHYROIDISM 11/27/2006  . NEOPLASM, MALIGNANT, PROSTATE, HX OF, S/P TURP 05/28/2007  . OSTEOARTHRITIS 11/27/2006  . WEIGHT LOSS, RECENT 11/27/2006   Past Surgical History:  Procedure Laterality Date  . arthroscopy rt knee    . HYDROCELE EXCISION / REPAIR    . PROSTATE SURGERY     TURP  . ROTATOR CUFF REPAIR      reports that he has never smoked. He has never used smokeless tobacco. He  reports that he does not drink alcohol or use drugs. family history includes Cancer in his father; Coronary artery disease in his mother; Heart disease in his mother; Pancreatic cancer in his father. No Known Allergies   Review of Systems  Constitutional: Positive for fatigue. Negative for unexpected weight change.  HENT: Negative for trouble swallowing.   Eyes: Negative for visual disturbance.  Respiratory: Negative for cough, chest tightness and shortness of breath.   Cardiovascular: Negative for chest pain, palpitations and leg swelling.  Gastrointestinal: Negative for abdominal pain.  Genitourinary: Negative for dysuria.  Musculoskeletal: Positive for arthralgias.  Neurological: Negative for dizziness, syncope, weakness, light-headedness and headaches.  Hematological: Negative for adenopathy. Bruises/bleeds easily.  Psychiatric/Behavioral: Negative for confusion.       Objective:   Physical Exam  Constitutional: He is oriented to person, place, and time. He appears well-developed and well-nourished.  Neck: Neck supple. No thyromegaly present.  Cardiovascular:  Occasional irregular beat. Heart rate palpated at 48.  Pulmonary/Chest: Effort normal and breath sounds normal. No respiratory distress. He has no wheezes. He has no rales.  Musculoskeletal: He exhibits no edema.  Lymphadenopathy:    He has no cervical adenopathy.  Neurological: He is alert and oriented to person, place, and time.  Skin:  He has some scattered bruises on his hands and forearms       Assessment:     #1 hypertension stable and at goal  #2 history of atrial fibrillation. He is on chronic anticoagulation.  #3 history of chronic mild bradycardia. Asymptomatic  #4 essential tremor. Occasionally difficult  with activities-such as pouring coffee and fine manipulation with hands.Marland Kitchen He is not a good candidate for beta blocker because of his chronic bradycardia and he is not interested in other options such  as Mysoline  #5 history of BPH stable  # 6 Hypothyroidism on replacement    Plan:     -We discussed fall prevention -Continue regular exercise habits. -Consider physical therapy referral for balance training if he continues to have any balance issues -Continue current medications. -Follow-up immediately for any lightheadedness or postural dizziness. -Follow-up in 3 months and obtain lab work then including TSH and basic metabolic panel  [name] MD [facility] at [facility]

## 2016-09-02 NOTE — Patient Instructions (Signed)
Stay well hydrated Let me know if you are having any dizziness/lightheadedness- especially with standing and changing positions. Let's plan on 3 month follow up.

## 2016-09-08 ENCOUNTER — Other Ambulatory Visit: Payer: Self-pay | Admitting: Family Medicine

## 2016-10-14 DIAGNOSIS — H401111 Primary open-angle glaucoma, right eye, mild stage: Secondary | ICD-10-CM | POA: Diagnosis not present

## 2016-10-14 DIAGNOSIS — H401123 Primary open-angle glaucoma, left eye, severe stage: Secondary | ICD-10-CM | POA: Diagnosis not present

## 2016-10-18 DIAGNOSIS — N401 Enlarged prostate with lower urinary tract symptoms: Secondary | ICD-10-CM | POA: Diagnosis not present

## 2016-10-18 DIAGNOSIS — N3281 Overactive bladder: Secondary | ICD-10-CM | POA: Diagnosis not present

## 2016-12-01 ENCOUNTER — Other Ambulatory Visit: Payer: Self-pay | Admitting: Family Medicine

## 2016-12-25 ENCOUNTER — Ambulatory Visit (INDEPENDENT_AMBULATORY_CARE_PROVIDER_SITE_OTHER): Payer: Medicare Other | Admitting: Family Medicine

## 2016-12-25 ENCOUNTER — Encounter: Payer: Self-pay | Admitting: Family Medicine

## 2016-12-25 VITALS — BP 140/90 | HR 71 | Temp 97.5°F | Wt 200.7 lb

## 2016-12-25 DIAGNOSIS — I480 Paroxysmal atrial fibrillation: Secondary | ICD-10-CM

## 2016-12-25 DIAGNOSIS — E038 Other specified hypothyroidism: Secondary | ICD-10-CM

## 2016-12-25 DIAGNOSIS — I10 Essential (primary) hypertension: Secondary | ICD-10-CM | POA: Diagnosis not present

## 2016-12-25 DIAGNOSIS — Z23 Encounter for immunization: Secondary | ICD-10-CM

## 2016-12-25 DIAGNOSIS — E7800 Pure hypercholesterolemia, unspecified: Secondary | ICD-10-CM

## 2016-12-25 LAB — LIPID PANEL
CHOLESTEROL: 134 mg/dL (ref 0–200)
HDL: 44.9 mg/dL (ref >39.00)
LDL Cholesterol: 78 mg/dL (ref 0–99)
NonHDL: 88.68
TRIGLYCERIDES: 52 mg/dL (ref 0.0–149.0)
Total CHOL/HDL Ratio: 3
VLDL: 10.4 mg/dL (ref 0.0–40.0)

## 2016-12-25 LAB — TSH: TSH: 6.25 u[IU]/mL — AB (ref 0.35–4.50)

## 2016-12-25 LAB — HEPATIC FUNCTION PANEL
ALBUMIN: 4.1 g/dL (ref 3.5–5.2)
ALT: 19 U/L (ref 0–53)
AST: 24 U/L (ref 0–37)
Alkaline Phosphatase: 47 U/L (ref 39–117)
Bilirubin, Direct: 0.3 mg/dL (ref 0.0–0.3)
TOTAL PROTEIN: 6.8 g/dL (ref 6.0–8.3)
Total Bilirubin: 1.2 mg/dL (ref 0.2–1.2)

## 2016-12-25 LAB — BASIC METABOLIC PANEL
BUN: 15 mg/dL (ref 6–23)
CHLORIDE: 100 meq/L (ref 96–112)
CO2: 30 meq/L (ref 19–32)
Calcium: 9.4 mg/dL (ref 8.4–10.5)
Creatinine, Ser: 0.84 mg/dL (ref 0.40–1.50)
GFR: 92.68 mL/min (ref >60.00)
GLUCOSE: 122 mg/dL — AB (ref 70–99)
POTASSIUM: 4.3 meq/L (ref 3.5–5.1)
Sodium: 136 mEq/L (ref 135–145)

## 2016-12-25 NOTE — Patient Instructions (Signed)
Bring in your BP cuff sometime to check with ours.

## 2016-12-25 NOTE — Progress Notes (Signed)
Subjective:     Patient ID: Daniel Everett, male   DOB: 07/09/1931, 83 y.o.   MRN: [id number]  HPI Patient seen for routine medical follow-up. His chronic problems include history of hypothyroidism, atrial fibrillation, essential tremor, hyperlipidemia, hypertension, BPH. He continues to stay very active. His wife is in nursing home and he visits her quite often. He still maintains his yard.  He has log of home blood pressure readings and these are consistently well controlled. He has had chronic bradycardia with heart rates ranging from 40s to 60 usually. No dizziness. No syncope. No chest pains. He remains on eliquis for his atrial fibrillation. No recent bleeding complications.  He has hypothyroidism and is due for follow-up labs there. Still needs flu vaccine  Past Medical History:  Diagnosis Date  . Allergic rhinitis due to pollen 06/07/2008  . Atrial fibrillation ([place])   . Benign essential HTN   . COLONIC POLYPS, HX OF 10/28/2006  . DIVERTICULOSIS, COLON 11/27/2006  . GEN OSTEOARTHROSIS INVOLVING MULTIPLE SITES 01/03/2010  . HYPERGLYCEMIA 10/18/2008  . HYPOTHYROIDISM 11/27/2006  . NEOPLASM, MALIGNANT, PROSTATE, HX OF, S/P TURP 05/28/2007  . OSTEOARTHRITIS 11/27/2006  . WEIGHT LOSS, RECENT 11/27/2006   Past Surgical History:  Procedure Laterality Date  . arthroscopy rt knee    . HYDROCELE EXCISION / REPAIR    . PROSTATE SURGERY     TURP  . ROTATOR CUFF REPAIR      reports that he has never smoked. He has never used smokeless tobacco. He reports that he does not drink alcohol or use drugs. family history includes Cancer in his father; Coronary artery disease in his mother; Heart disease in his mother; Pancreatic cancer in his father. No Known Allergies   Review of Systems  Constitutional: Negative for fatigue.  Eyes: Negative for visual disturbance.  Respiratory: Negative for cough, chest tightness and shortness of breath.   Cardiovascular: Negative for chest pain, palpitations and leg  swelling.  Neurological: Negative for dizziness, syncope, weakness, light-headedness and headaches.       Objective:   Physical Exam  Constitutional: He is oriented to person, place, and time. He appears well-developed and well-nourished.  HENT:  Right Ear: External ear normal.  Left Ear: External ear normal.  Mouth/Throat: Oropharynx is clear and moist.  Eyes: Pupils are equal, round, and reactive to light.  Neck: Neck supple. No thyromegaly present.  Cardiovascular: Normal rate.   Slightly bradycardic with heart rate around 50 which is chronic and stable for him  Pulmonary/Chest: Effort normal and breath sounds normal. No respiratory distress. He has no wheezes. He has no rales.  Musculoskeletal: He exhibits no edema.  Neurological: He is alert and oriented to person, place, and time.       Assessment:     #1 hypertension stable by home readings. Slightly elevated here today  #2 atrial fibrillation on chronic anticoagulation  #3 hypothyroidism  #4 hyperlipidemia    Plan:     -Flu vaccine given -Continue current medications -Check further labs today with TSH, basic metabolic panel, lipid panel, hepatic panel -Routine follow-up in 6 months and sooner as needed  [name] MD [place] [facility] at [facility]

## 2016-12-27 ENCOUNTER — Other Ambulatory Visit: Payer: Self-pay | Admitting: Family Medicine

## 2016-12-27 DIAGNOSIS — E038 Other specified hypothyroidism: Secondary | ICD-10-CM

## 2016-12-27 MED ORDER — LEVOTHYROXINE SODIUM 75 MCG PO TABS: 75.0000 ug | ORAL_TABLET | Freq: Every day | ORAL | 1 refills | Status: DC

## 2017-01-28 ENCOUNTER — Encounter: Payer: Self-pay | Admitting: Family Medicine

## 2017-01-28 ENCOUNTER — Ambulatory Visit (INDEPENDENT_AMBULATORY_CARE_PROVIDER_SITE_OTHER): Payer: Medicare Other | Admitting: Family Medicine

## 2017-01-28 VITALS — BP 108/70 | HR 64 | Temp 98.5°F | Ht 77.0 in | Wt 201.6 lb

## 2017-01-28 DIAGNOSIS — I10 Essential (primary) hypertension: Secondary | ICD-10-CM | POA: Diagnosis not present

## 2017-01-28 DIAGNOSIS — E038 Other specified hypothyroidism: Secondary | ICD-10-CM | POA: Diagnosis not present

## 2017-01-28 DIAGNOSIS — R251 Tremor, unspecified: Secondary | ICD-10-CM | POA: Diagnosis not present

## 2017-01-28 NOTE — Patient Instructions (Signed)
Return in 1-2 months to get TSH (thyroid) repeated Your blood pressure looks great today. Continue with current medications.

## 2017-01-28 NOTE — Progress Notes (Signed)
Subjective:     Patient ID: Daniel Everett, male   DOB: 02-Jun-1931, 83 y.o.   MRN: [id number]  HPI   Patient here with multiple issues as follows  Hypertension treated with low-dose amlodipine and lisinopril. Brings log of home blood pressures and these been very well controlled. No orthostatic symptoms. No headaches.  Patient has hypothyroidism. Recent TSH 6.25. Titrated levothyroxine up to 75 g daily. This was only about a month ago. Compliant with therapy.  Patient has tremor involving both hands and upper extremities. Does not extinguish with movement and worse with fine motor skills. He states his sister has similar tremor. His sister had insisted that he see a neurologist and he is inquiring about this today. No known family history of Parkinson's disease.  No clear exacerbating or alleviating factors.  He states he has felt somewhat more "off balance" recently. No falls. No focal weakness. No confusion. No headaches.  Past Medical History:  Diagnosis Date  . Allergic rhinitis due to pollen 06/07/2008  . Atrial fibrillation ([place])   . Benign essential HTN   . COLONIC POLYPS, HX OF 10/28/2006  . DIVERTICULOSIS, COLON 11/27/2006  . GEN OSTEOARTHROSIS INVOLVING MULTIPLE SITES 01/03/2010  . HYPERGLYCEMIA 10/18/2008  . HYPOTHYROIDISM 11/27/2006  . NEOPLASM, MALIGNANT, PROSTATE, HX OF, S/P TURP 05/28/2007  . OSTEOARTHRITIS 11/27/2006  . WEIGHT LOSS, RECENT 11/27/2006   Past Surgical History:  Procedure Laterality Date  . arthroscopy rt knee    . HYDROCELE EXCISION / REPAIR    . PROSTATE SURGERY     TURP  . ROTATOR CUFF REPAIR      reports that  has never smoked. he has never used smokeless tobacco. He reports that he does not drink alcohol or use drugs. family history includes Cancer in his father; Coronary artery disease in his mother; Heart disease in his mother; Pancreatic cancer in his father. No Known Allergies   Review of Systems  Constitutional: Negative for fatigue.  Eyes:  Negative for visual disturbance.  Respiratory: Negative for cough, chest tightness and shortness of breath.   Cardiovascular: Negative for chest pain, palpitations and leg swelling.  Neurological: Positive for tremors. Negative for dizziness, seizures, syncope, weakness, light-headedness and headaches.       Objective:   Physical Exam  Constitutional: He is oriented to person, place, and time. He appears well-developed and well-nourished.  HENT:  Right Ear: External ear normal.  Left Ear: External ear normal.  Mouth/Throat: Oropharynx is clear and moist.  Eyes: Pupils are equal, round, and reactive to light.  Neck: Neck supple. No thyromegaly present.  Cardiovascular: Normal rate and regular rhythm.  Pulmonary/Chest: Effort normal and breath sounds normal. No respiratory distress. He has no wheezes. He has no rales.  Musculoskeletal: He exhibits no edema.  Neurological: He is alert and oriented to person, place, and time. No cranial nerve deficit. Coordination normal.  Patient has tremor involving both upper extremities. This does not extinguish with movement. No bradykinesias. No cogwheel rigidity.  No focal weakness       Assessment:     #1 hypertension stable and at goal  #2 hypothyroidism-undertreated by recent labs  #3 bilateral upper extremity tremor. 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

## 2017-02-14 ENCOUNTER — Other Ambulatory Visit: Payer: Self-pay | Admitting: Cardiology

## 2017-02-17 DIAGNOSIS — N401 Enlarged prostate with lower urinary tract symptoms: Secondary | ICD-10-CM | POA: Diagnosis not present

## 2017-02-17 DIAGNOSIS — N3281 Overactive bladder: Secondary | ICD-10-CM | POA: Diagnosis not present

## 2017-03-05 DIAGNOSIS — L570 Actinic keratosis: Secondary | ICD-10-CM | POA: Diagnosis not present

## 2017-03-05 DIAGNOSIS — D225 Melanocytic nevi of trunk: Secondary | ICD-10-CM | POA: Diagnosis not present

## 2017-03-05 DIAGNOSIS — L821 Other seborrheic keratosis: Secondary | ICD-10-CM | POA: Diagnosis not present

## 2017-03-05 DIAGNOSIS — Q828 Other specified congenital malformations of skin: Secondary | ICD-10-CM | POA: Diagnosis not present

## 2017-03-05 DIAGNOSIS — Z85828 Personal history of other malignant neoplasm of skin: Secondary | ICD-10-CM | POA: Diagnosis not present

## 2017-03-10 ENCOUNTER — Other Ambulatory Visit: Payer: Self-pay | Admitting: *Deleted

## 2017-03-10 MED ORDER — RANITIDINE HCL 150 MG PO TABS
150.0000 mg | ORAL_TABLET | Freq: Two times a day (BID) | ORAL | 0 refills | Status: DC
Start: 2017-03-10 — End: 2017-05-15

## 2017-03-21 ENCOUNTER — Ambulatory Visit: Payer: Self-pay | Admitting: Neurology

## 2017-03-24 ENCOUNTER — Encounter: Payer: Self-pay | Admitting: Family Medicine

## 2017-03-24 ENCOUNTER — Ambulatory Visit (INDEPENDENT_AMBULATORY_CARE_PROVIDER_SITE_OTHER): Payer: Medicare Other | Admitting: Family Medicine

## 2017-03-24 VITALS — BP 130/90 | Temp 97.5°F | Wt 206.0 lb

## 2017-03-24 DIAGNOSIS — M1711 Unilateral primary osteoarthritis, right knee: Secondary | ICD-10-CM

## 2017-03-24 NOTE — Patient Instructions (Signed)

## 2017-03-24 NOTE — Progress Notes (Signed)
Subjective:     Patient ID: Daniel Everett, male   DOB: 1931-06-12, 83 y.o.   MRN: [id number]  HPI Patient with right knee pain. He states last week he was cleaning out some files and was on his knees on the carpet for quite some time. Afterwards he noticed some swelling and increased stiffness of the right knee. He has known osteoarthritis of the right knee. He had x-rays May 2017 which showed significant loss of medial joint space. He has some chronic knee pains at baseline but worsening after last week. He has noticed improvement past couple days. He takes Tylenol at baseline. Took glucosamine once but did not see much improvement. He is on eliquis and cannot take nonsteroidals. He has hesitated to see orthopedics regarding knee replacement as his wife is in institution and he visits her frequently  Past Medical History:  Diagnosis Date  . Allergic rhinitis due to pollen 06/07/2008  . Atrial fibrillation ([place])   . Benign essential HTN   . COLONIC POLYPS, HX OF 10/28/2006  . DIVERTICULOSIS, COLON 11/27/2006  . GEN OSTEOARTHROSIS INVOLVING MULTIPLE SITES 01/03/2010  . HYPERGLYCEMIA 10/18/2008  . HYPOTHYROIDISM 11/27/2006  . NEOPLASM, MALIGNANT, PROSTATE, HX OF, S/P TURP 05/28/2007  . OSTEOARTHRITIS 11/27/2006  . WEIGHT LOSS, RECENT 11/27/2006   Past Surgical History:  Procedure Laterality Date  . arthroscopy rt knee    . HYDROCELE EXCISION / REPAIR    . PROSTATE SURGERY     TURP  . ROTATOR CUFF REPAIR      reports that  has never smoked. he has never used smokeless tobacco. He reports that he does not drink alcohol or use drugs. family history includes Cancer in his father; Coronary artery disease in his mother; Heart disease in his mother; Pancreatic cancer in his father. No Known Allergies   Review of Systems  Musculoskeletal: Positive for arthralgias.  Neurological: Negative for weakness and numbness.       Objective:   Physical Exam  Constitutional: He appears well-developed and  well-nourished.  Cardiovascular: Normal rate and regular rhythm.  Pulmonary/Chest: Effort normal and breath sounds normal. No respiratory distress. He has no wheezes. He has no rales.  Musculoskeletal:  Right knee reveals very small effusion. Minimal warmth. No erythema. No localized tenderness. Full range of motion.       Assessment:     Primary osteoarthritis right knee with recent aggravation-improving past few days per patient.. Patient is not a candidate for nonsteroidals because of his age and chronic anticoagulation    Plan:     -Continue Tylenol as needed -We recommend he consider some icing 2-3 times daily -We discussed potential other options including steroid injection versus Visco-through sports medicine. Patient will consider -Reassess in 2 weeks  [name] MD [place] [facility] at [facility]

## 2017-03-31 ENCOUNTER — Other Ambulatory Visit (INDEPENDENT_AMBULATORY_CARE_PROVIDER_SITE_OTHER): Payer: Medicare HMO

## 2017-03-31 DIAGNOSIS — E038 Other specified hypothyroidism: Secondary | ICD-10-CM | POA: Diagnosis not present

## 2017-04-01 LAB — TSH: TSH: 3.81 u[IU]/mL (ref 0.35–4.50)

## 2017-04-08 ENCOUNTER — Ambulatory Visit (INDEPENDENT_AMBULATORY_CARE_PROVIDER_SITE_OTHER): Payer: Medicare HMO | Admitting: Family Medicine

## 2017-04-08 ENCOUNTER — Encounter: Payer: Self-pay | Admitting: Family Medicine

## 2017-04-08 VITALS — BP 110/80 | HR 80 | Temp 97.9°F | Wt 205.4 lb

## 2017-04-08 DIAGNOSIS — M1711 Unilateral primary osteoarthritis, right knee: Secondary | ICD-10-CM | POA: Diagnosis not present

## 2017-04-08 NOTE — Patient Instructions (Signed)
Try to do some exercises for quadriceps muscles- such as cycling and knee extensions with weights. May also consider trying elliptical.

## 2017-04-08 NOTE — Progress Notes (Signed)
Subjective:     Patient ID: Daniel Everett, male   DOB: September 20, 1931, 85 y.o.   MRN: [id number]  HPI Follow-up primary osteoarthritis right knee. Recent aggravation from squatting at home. He takes anticoagulation and we reviewed options last visit including steroid for Visco injection but he declined. He's not been doing any icing. Taking Tylenol as needed. Less swollen since last visit. Still has some stiffness and pain with ambulation but no pain at rest. He is not interested in looking at potential for knee replacement this point  Past Medical History:  Diagnosis Date  . Allergic rhinitis due to pollen 06/07/2008  . Atrial fibrillation ([place])   . Benign essential HTN   . COLONIC POLYPS, HX OF 10/28/2006  . DIVERTICULOSIS, COLON 11/27/2006  . GEN OSTEOARTHROSIS INVOLVING MULTIPLE SITES 01/03/2010  . HYPERGLYCEMIA 10/18/2008  . HYPOTHYROIDISM 11/27/2006  . NEOPLASM, MALIGNANT, PROSTATE, HX OF, S/P TURP 05/28/2007  . OSTEOARTHRITIS 11/27/2006  . WEIGHT LOSS, RECENT 11/27/2006   Past Surgical History:  Procedure Laterality Date  . arthroscopy rt knee    . HYDROCELE EXCISION / REPAIR    . PROSTATE SURGERY     TURP  . ROTATOR CUFF REPAIR      reports that  has never smoked. he has never used smokeless tobacco. He reports that he does not drink alcohol or use drugs. family history includes Cancer in his father; Coronary artery disease in his mother; Heart disease in his mother; Pancreatic cancer in his father. No Known Allergies   Review of Systems  Neurological: Negative for weakness and numbness.       Objective:   Physical Exam  Constitutional: He appears well-developed and well-nourished.  Cardiovascular: Normal rate.  Pulmonary/Chest: Effort normal and breath sounds normal. No respiratory distress. He has no wheezes. He has no rales.  Musculoskeletal:  Right knee reveals good range of motion. No effusion. No warmth. No erythema.       Assessment:     Primary osteoarthritis right  knee    Plan:     -We again discussed options. He is not interested in steroid injection at this point as he feels this is somewhat improved from last visit -Stressed importance of doing quadriceps strengthening exercises. He is looking at reinitiating gym membership.  Reviewed appropriate exercises for quadriceps strengthening -Continue Tylenol as needed for discomfort. Avoid nonsteroidals.  [name] MD [place] [facility] at [facility]

## 2017-04-10 ENCOUNTER — Ambulatory Visit: Payer: Medicare HMO | Admitting: Neurology

## 2017-04-10 ENCOUNTER — Encounter: Payer: Self-pay | Admitting: Neurology

## 2017-04-10 VITALS — BP 146/91 | HR 42 | Ht 77.0 in | Wt 204.0 lb

## 2017-04-10 DIAGNOSIS — G250 Essential tremor: Secondary | ICD-10-CM | POA: Diagnosis not present

## 2017-04-10 DIAGNOSIS — G3281 Cerebellar ataxia in diseases classified elsewhere: Secondary | ICD-10-CM

## 2017-04-10 NOTE — Progress Notes (Signed)
Reason for visit: Tremor  Referring physician: Dr. Hilda Lias is a 84 y.o. male  History of present illness:  Mr. Griffith is an 84 year old right-handed white male with a history of an essential tremor.  The patient noted the tremor onset about 2 or 3 years ago, this has gradually worsened over time.  The patient indicates that he has some difficulty with handwriting and some problems with feeding himself.  He has difficulty with performing tasks that require fine motor control.  He denies any tremor involving the head or neck, he denies any vocal tremor.  He does report some mild gait instability, he has fallen on occasion.  He denies any neck pain or back pain.  He has no numbness of the extremities he does have some sensation of fatigue but no true muscle weakness.  He does have some difficulty with bladder control with urgency, he is seeing a urologist for this.  His sister also has a tremor, she has seizures as well related to encephalomalacia of the brain.  The patient is concerned that he may also have some problem with the brain causing the tremor.  He is sent to this office for further evaluation.  The patient denies that he has had to give up doing anything because of the tremor.  Past Medical History:  Diagnosis Date  . Allergic rhinitis due to pollen 06/07/2008  . Atrial fibrillation ([place])   . Benign essential HTN   . COLONIC POLYPS, HX OF 10/28/2006  . DIVERTICULOSIS, COLON 11/27/2006  . GEN OSTEOARTHROSIS INVOLVING MULTIPLE SITES 01/03/2010  . HYPERGLYCEMIA 10/18/2008  . HYPOTHYROIDISM 11/27/2006  . NEOPLASM, MALIGNANT, PROSTATE, HX OF, S/P TURP 05/28/2007  . OSTEOARTHRITIS 11/27/2006  . WEIGHT LOSS, RECENT 11/27/2006    Past Surgical History:  Procedure Laterality Date  . arthroscopy rt knee    . HYDROCELE EXCISION / REPAIR    . PROSTATE SURGERY     TURP  . ROTATOR CUFF REPAIR      Family History  Problem Relation Age of Onset  . Coronary artery disease Mother    . Heart disease Mother   . Pancreatic cancer Father   . Cancer Father   . Tremor Sister     Social history:  reports that  has never smoked. he has never used smokeless tobacco. He reports that he does not drink alcohol or use drugs.  Medications:  Prior to Admission medications   Medication Sig Start Date End Date Taking? Authorizing Provider  acetaminophen (TYLENOL) 500 MG tablet Take 500 mg by mouth 2 (two) times daily. Take one tablet twice a day.    Yes [provider]  amLODipine (NORVASC) 5 MG tablet TAKE 1 TABLET BY MOUTH  DAILY 12/02/16  Yes Burchette, Alinda Sierras, MD  apixaban (ELIQUIS) 5 MG TABS tablet Take 1 tablet (5 mg total) by mouth 2 (two) times daily. 05/16/16  Yes Martinique, Peter M, MD  aspirin 81 MG tablet Take 81 mg by mouth daily.     Yes [provider]  atorvastatin (LIPITOR) 20 MG tablet TAKE 1 TABLET BY MOUTH  DAILY 12/02/16  Yes Burchette, Alinda Sierras, MD  finasteride (PROSCAR) 5 MG tablet TAKE 1 TABLET BY MOUTH  DAILY 12/02/16  Yes Burchette, Alinda Sierras, MD  hydrochlorothiazide (MICROZIDE) 12.5 MG capsule TAKE 1 CAPSULE BY MOUTH  DAILY 02/17/17  Yes Martinique, Peter M, MD  latanoprost (XALATAN) 0.005 % ophthalmic solution Place 1 drop into both eyes at bedtime.  Yes [provider]  levothyroxine (SYNTHROID, LEVOTHROID) 75 MCG tablet Take 1 tablet (75 mcg total) by mouth daily. 12/27/16  Yes Burchette, Alinda Sierras, MD  lisinopril (PRINIVIL,ZESTRIL) 20 MG tablet TAKE 1 TABLET BY MOUTH  DAILY 12/02/16  Yes Burchette, Alinda Sierras, MD  Multiple Vitamin (MULTIVITAMIN) tablet Take 1 tablet by mouth daily.     Yes [provider]  Pyridoxine HCl (VITAMIN B-6) 500 MG tablet Take 500 mg by mouth daily.   Yes [provider]  ranitidine (ZANTAC) 150 MG tablet Take 1 tablet (150 mg total) by mouth 2 (two) times daily. 03/10/17  Yes Burchette, Alinda Sierras, MD  tamsulosin (FLOMAX) 0.4 MG CAPS capsule Take 0.4 mg by mouth daily.    Yes [provider]    timolol (TIMOPTIC) 0.5 % ophthalmic solution Place 1 drop into both eyes 2 (two) times daily.     Yes [provider]     No Known Allergies  ROS:  Out of a complete 14 system review of symptoms, the patient complains only of the following symptoms, and all other reviewed systems are negative.  Fatigue Itching, moles Shortness of breath Incontinence of the bladder, impotence Easy bruising, easy bleeding Joint pain, joint swelling, aching muscles Runny nose Memory loss, weakness, tremors Decreased energy  Blood pressure (!) 146/91, pulse (!) 42, height 6\' 5"  (1.956 m), weight 204 lb (92.5 kg).  Physical Exam  General: The patient is alert and cooperative at the time of the examination.  Eyes: Pupils are equal, round, and reactive to light. Discs are flat bilaterally.  Neck: The neck is supple, no carotid bruits are noted.  Respiratory: The respiratory examination is clear.  Cardiovascular: The cardiovascular examination reveals a regular rate and rhythm, no obvious murmurs or rubs are noted.  Skin: Extremities are without significant edema.  Neurologic Exam  Mental status: The patient is alert and oriented x 3 at the time of the examination. The patient has apparent normal recent and remote memory, with an apparently normal attention span and concentration ability.  Cranial nerves: Facial symmetry is present. There is good sensation of the face to pinprick and soft touch bilaterally. The strength of the facial muscles and the muscles to head turning and shoulder shrug are normal bilaterally. Speech is well enunciated, no aphasia or dysarthria is noted. Extraocular movements are full. Visual fields are full. The tongue is midline, and the patient has symmetric elevation of the soft palate. No obvious hearing deficits are noted.  Motor: The motor testing reveals 5 over 5 strength of all 4 extremities. Good symmetric motor tone is noted throughout.  Sensory: Sensory  testing is intact to pinprick, soft touch, vibration sensation, and position sense on all 4 extremities, with exception of some decrease in vibration and position sense in both feet.  No definite stocking pattern pinprick sensory deficit was noted. No evidence of extinction is noted.  Coordination: Cerebellar testing reveals good finger-nose-finger and heel-to-shin bilaterally.  A mild tremor seen with finger-nose-finger bilaterally.  Minimal tremor is translated into the handwriting with drawing a spiral.  Gait and station: Gait is normal. Tandem gait is normal. Romberg is negative. No drift is seen.  Reflexes: Deep tendon reflexes are symmetric and normal bilaterally, with exception of some decrease in ankle jerk reflexes bilaterally. Toes are downgoing bilaterally.   Assessment/Plan:  1.  Essential tremor  The patient appears to have a tremor consistent with an essential tremor.  The patient has a sister with a similar  tremor.  He denies that either parent had tremor, but his father died at age 30.  The patient does not wish to have any medications for the tremor.  He is concerned that he may have a brain lesion causing the tremor.  We will check a CT scan of the brain, but the tremor likely is genetic in nature.  The patient will follow-up in 6-8 months.  He will call if he decides he wishes to have medication.   Jill Alexanders MD 04/10/2017 8:57 AM  [facility] [address] [place], [place] [address]  Phone [phone] Fax [phone]

## 2017-04-10 NOTE — Patient Instructions (Signed)
   We will get CT of the brain.  

## 2017-04-27 ENCOUNTER — Other Ambulatory Visit: Payer: Self-pay | Admitting: Family Medicine

## 2017-04-28 ENCOUNTER — Other Ambulatory Visit: Payer: Medicare HMO

## 2017-04-29 ENCOUNTER — Telehealth: Payer: Self-pay | Admitting: Neurology

## 2017-04-29 NOTE — Telephone Encounter (Signed)
[employer] did not approve the CT Head. The phone number for the peer to peer is [phone] opt 4,2. The case number is [id number].

## 2017-04-30 ENCOUNTER — Other Ambulatory Visit: Payer: Medicare HMO

## 2017-04-30 NOTE — Telephone Encounter (Signed)
Noted, left a voicemail for patient to call back to inform him of the denial.

## 2017-05-01 NOTE — Telephone Encounter (Signed)
Spoke to the patient he is aware that it has been denied.

## 2017-05-04 NOTE — Progress Notes (Signed)
Cardiology Office Note   Date:  05/04/2017   ID:  Daniel Everett, DOB 12-07-1931, MRN [id number]  PCP:  [name], MD  Cardiologist:  Dr Martinique  Hilman Kissling Martinique, MD 03/27/2016   History of Present Illness: Daniel Everett is a 84 y.o. male with a history of hypothyroid, hyperglycemia, prostate CA s/p TURP, OA, colon polyps, HTN, RBBB- seen for follow up of Atrial fibrillation.  He was initially seen by Rosaria Ferries PA-C in January 2018 for atrial fibrillation with  HR 50s at baseline, on Xarelto. Holter ordered. BP up, HCTZ added. BMET 1 week later was ok. Holter w/ no pauses > 3.2 sec, HR generally slow, 52 avg, lowest 21 at 5 am. Echo showed normal EF. Mild MR, mod TR. Severe biatrial enlargement.   Pt is doing fairly well. He notes that he doesn't have a lot of energy but denies any chest pain, dyspnea, palpitations, dizziness or syncope. Seen recently by Neuro- has a tremor and chronic imbalance.      Past Medical History:  Diagnosis Date  . Allergic rhinitis due to pollen 06/07/2008  . Atrial fibrillation ([place])   . Benign essential HTN   . COLONIC POLYPS, HX OF 10/28/2006  . DIVERTICULOSIS, COLON 11/27/2006  . GEN OSTEOARTHROSIS INVOLVING MULTIPLE SITES 01/03/2010  . HYPERGLYCEMIA 10/18/2008  . HYPOTHYROIDISM 11/27/2006  . NEOPLASM, MALIGNANT, PROSTATE, HX OF, S/P TURP 05/28/2007  . OSTEOARTHRITIS 11/27/2006  . WEIGHT LOSS, RECENT 11/27/2006    Past Surgical History:  Procedure Laterality Date  . arthroscopy rt knee    . HYDROCELE EXCISION / REPAIR    . PROSTATE SURGERY     TURP  . ROTATOR CUFF REPAIR      Current Outpatient Medications  Medication Sig Dispense Refill  . acetaminophen (TYLENOL) 500 MG tablet Take 500 mg by mouth 2 (two) times daily. Take one tablet twice a day.     Marland Kitchen amLODipine (NORVASC) 5 MG tablet TAKE 1 TABLET BY MOUTH  DAILY 90 tablet 1  . apixaban (ELIQUIS) 5 MG TABS tablet Take 1 tablet (5 mg total) by mouth 2 (two) times daily. 60 tablet 11   . aspirin 81 MG tablet Take 81 mg by mouth daily.      Marland Kitchen atorvastatin (LIPITOR) 20 MG tablet TAKE 1 TABLET BY MOUTH  DAILY 90 tablet 1  . finasteride (PROSCAR) 5 MG tablet TAKE 1 TABLET BY MOUTH  DAILY 90 tablet 1  . hydrochlorothiazide (MICROZIDE) 12.5 MG capsule TAKE 1 CAPSULE BY MOUTH  DAILY 90 capsule 3  . latanoprost (XALATAN) 0.005 % ophthalmic solution Place 1 drop into both eyes at bedtime.     Marland Kitchen levothyroxine (SYNTHROID, LEVOTHROID) 75 MCG tablet TAKE 1 TABLET BY MOUTH  DAILY 90 tablet 1  . lisinopril (PRINIVIL,ZESTRIL) 20 MG tablet TAKE 1 TABLET BY MOUTH  DAILY 90 tablet 1  . Multiple Vitamin (MULTIVITAMIN) tablet Take 1 tablet by mouth daily.      . Pyridoxine HCl (VITAMIN B-6) 500 MG tablet Take 500 mg by mouth daily.    . ranitidine (ZANTAC) 150 MG tablet Take 1 tablet (150 mg total) by mouth 2 (two) times daily. 60 tablet 0  . tamsulosin (FLOMAX) 0.4 MG CAPS capsule Take 0.4 mg by mouth daily.     . timolol (TIMOPTIC) 0.5 % ophthalmic solution Place 1 drop into both eyes 2 (two) times daily.       No current facility-administered medications for this visit.     Allergies:  Patient has no known allergies.    Social History:  The patient  reports that  has never smoked. he has never used smokeless tobacco. He reports that he does not drink alcohol or use drugs.   Family History:  The patient's family history includes Cancer in his father; Coronary artery disease in his mother; Heart disease in his mother; Pancreatic cancer in his father; Tremor in his sister.    ROS:  Please see the history of present illness. All other systems are reviewed and negative.    PHYSICAL EXAM: VS:  There were no vitals taken for this visit. , BMI There is no height or weight on file to calculate BMI. GENERAL:  Well appearing, elderly WM in NAD HEENT:  PERRL, EOMI, sclera are clear. Oropharynx is clear. NECK:  No jugular venous distention, carotid upstroke brisk and symmetric, no bruits, no  thyromegaly or adenopathy LUNGS:  Clear to auscultation bilaterally CHEST:  Unremarkable HEART:  IRRR,  I get rate 52. PMI not displaced or sustained,S1 and S2 within normal limits, no S3, no S4: no clicks, no rubs, no murmurs ABD:  Soft, nontender. BS +, no masses or bruits. No hepatomegaly, no splenomegaly EXT:  2 + pulses throughout, no edema, no cyanosis no clubbing SKIN:  Warm and dry.  No rashes NEURO:  Alert and oriented x 3. Cranial nerves II through XII intact. PSYCH:  Cognitively intact     EKG:  EKG is ordered today. Afib rate 44. Old anterolateral infarct. I have personally reviewed and interpreted this study.   ECHO: 04/08/2016 - Left ventricle: The cavity size was normal. Wall thickness was   normal. Systolic function was normal. The estimated ejection   fraction was in the range of 55% to 60%. Wall motion was normal;   there were no regional wall motion abnormalities. The study is   not technically sufficient to allow evaluation of LV diastolic   function. - Aortic valve: There was trivial regurgitation. - Mitral valve: There was mild regurgitation. - Left atrium: The atrium was severely dilated. - Right atrium: The atrium was severely dilated. - Atrial septum: No defect or patent foramen ovale was identified. - Tricuspid valve: There was moderate regurgitation.  Recent Labs: 12/25/2016: ALT 19; BUN 15; Creatinine, Ser 0.84; Potassium 4.3; Sodium 136 03/31/2017: TSH 3.81    Lipid Panel    Component Value Date/Time   CHOL 134 12/25/2016 0827   TRIG 52.0 12/25/2016 0827   HDL 44.90 12/25/2016 0827   CHOLHDL 3 12/25/2016 0827   VLDL 10.4 12/25/2016 0827   LDLCALC 78 12/25/2016 0827     Wt Readings from Last 3 Encounters:  04/10/17 204 lb (92.5 kg)  04/08/17 205 lb 6.4 oz (93.2 kg)  03/24/17 206 lb (93.4 kg)     Other studies Reviewed: Additional studies/ records that were reviewed today include: office notes and testing.  Holter 04/04/16: Study  Highlights    Atrial fibrillation with slow ventricular response  Rare PVCs and PVC couplets  Longest pause 3.2 seconds    Echo 04/08/16: Study Conclusions  - Left ventricle: The cavity size was normal. Wall thickness was   normal. Systolic function was normal. The estimated ejection   fraction was in the range of 55% to 60%. Wall motion was normal;   there were no regional wall motion abnormalities. The study is   not technically sufficient to allow evaluation of LV diastolic   function. - Aortic valve: There was trivial regurgitation. - Mitral valve:  There was mild regurgitation. - Left atrium: The atrium was severely dilated. - Right atrium: The atrium was severely dilated. - Atrial septum: No defect or patent foramen ovale was identified. - Tricuspid valve: There was moderate regurgitation.  ASSESSMENT AND PLAN:  1. Persisten Atrial fib: he is asymptomatic. He has a slow ventricular response but is on no rate slowing therapy.  His atria are severely enlarged, doubtful he could maintain SR. He anticoagulated. Recommend stopping ASA. If he should develop symptoms he would need to be considered for a pacemaker.   2. Bradycardia: . He is encouraged to watch for sx of presyncope or fatigue and contact us if he gets them.  3. Chronic anticoag: CHA2DS2VASc=3 (age x 2, HTN). Continue Eliquis.   4. HTN  Current medicines are reviewed at length with the patient today.  The patient has concerns regarding medicines. Concerns were addressed.  The following changes have been made:  no change  Labs/ tests ordered today include:  No orders of the defined types were placed in this encounter.    Disposition:  Follow up in one year.  Signed, Deroy Noah Martinique, MD  05/04/2017 10:15 AM    [facility]

## 2017-05-07 ENCOUNTER — Ambulatory Visit: Payer: Medicare HMO | Admitting: Cardiology

## 2017-05-07 ENCOUNTER — Encounter: Payer: Self-pay | Admitting: Cardiology

## 2017-05-07 VITALS — BP 112/78 | HR 44 | Ht 77.0 in | Wt 204.4 lb

## 2017-05-07 DIAGNOSIS — Z7901 Long term (current) use of anticoagulants: Secondary | ICD-10-CM | POA: Diagnosis not present

## 2017-05-07 DIAGNOSIS — I481 Persistent atrial fibrillation: Secondary | ICD-10-CM

## 2017-05-07 DIAGNOSIS — I10 Essential (primary) hypertension: Secondary | ICD-10-CM

## 2017-05-07 DIAGNOSIS — I4819 Other persistent atrial fibrillation: Secondary | ICD-10-CM

## 2017-05-07 NOTE — Patient Instructions (Addendum)
Stop taking ASA   Continue your other therapy  I will see you in one year. 

## 2017-05-12 DIAGNOSIS — H401123 Primary open-angle glaucoma, left eye, severe stage: Secondary | ICD-10-CM | POA: Diagnosis not present

## 2017-05-12 DIAGNOSIS — Z961 Presence of intraocular lens: Secondary | ICD-10-CM | POA: Diagnosis not present

## 2017-05-12 DIAGNOSIS — H401111 Primary open-angle glaucoma, right eye, mild stage: Secondary | ICD-10-CM | POA: Diagnosis not present

## 2017-05-15 ENCOUNTER — Other Ambulatory Visit: Payer: Self-pay

## 2017-05-15 MED ORDER — LISINOPRIL 20 MG PO TABS: 20.0000 mg | ORAL_TABLET | Freq: Every day | ORAL | 3 refills | Status: DC

## 2017-05-15 MED ORDER — HYDROCHLOROTHIAZIDE 12.5 MG PO CAPS: 12.5000 mg | ORAL_CAPSULE | Freq: Every day | ORAL | 3 refills | Status: DC

## 2017-05-15 MED ORDER — APIXABAN 5 MG PO TABS: 5.0000 mg | ORAL_TABLET | Freq: Two times a day (BID) | ORAL | 3 refills | Status: AC

## 2017-05-15 MED ORDER — ATORVASTATIN CALCIUM 20 MG PO TABS: 20.0000 mg | ORAL_TABLET | Freq: Every day | ORAL | 3 refills | Status: DC

## 2017-05-15 MED ORDER — RANITIDINE HCL 150 MG PO TABS: 150.0000 mg | ORAL_TABLET | Freq: Two times a day (BID) | ORAL | 3 refills | Status: DC

## 2017-05-15 MED ORDER — AMLODIPINE BESYLATE 5 MG PO TABS: 5.0000 mg | ORAL_TABLET | Freq: Every day | ORAL | 3 refills | Status: DC

## 2017-05-19 ENCOUNTER — Telehealth: Payer: Self-pay | Admitting: *Deleted

## 2017-05-19 MED ORDER — FINASTERIDE 5 MG PO TABS: 5.0000 mg | ORAL_TABLET | Freq: Every day | ORAL | 2 refills | Status: DC

## 2017-05-19 MED ORDER — LEVOTHYROXINE SODIUM 75 MCG PO TABS: 75.0000 ug | ORAL_TABLET | Freq: Every day | ORAL | 2 refills | Status: DC

## 2017-05-19 NOTE — Telephone Encounter (Signed)
Patient walked in with a request to refill all of his medication for 90 days, and have them sent to [facility].  Left message on machine with patient to review which medications need refilling.

## 2017-05-19 NOTE — Telephone Encounter (Signed)
Copied from [place] [phone]. Topic: Quick Communication - See Telephone Encounter >> May 19, 2017 11:28 AM Lamarr Lulas, CMA wrote: CRM for notification. See Telephone encounter for: to see which medications the patient would like to be sent to [facility]   05/19/17. >> May 19, 2017  1:14 PM Cleaster Corin, NT wrote: Pt. Will need meds finasteride (PROSCAR) 5 MG tablet [[id number]]  levothyroxine (SYNTHROID, LEVOTHROID) 75 MCG tablet [[id number]] called in to [employer] [facility]. Home delivery

## 2017-05-19 NOTE — Telephone Encounter (Signed)
Rx sent 

## 2017-05-21 ENCOUNTER — Telehealth: Payer: Self-pay | Admitting: Family Medicine

## 2017-05-21 NOTE — Telephone Encounter (Deleted)
Copied from [place]. Topic: Quick Communication - See Telephone Encounter >> May 21, 2017  9:46 AM Ivar Drape wrote: E R R O R     05/21/17.

## 2017-05-21 NOTE — Telephone Encounter (Signed)
Rx sent 05/19/17

## 2017-05-21 NOTE — Telephone Encounter (Signed)
Patient expressed that the preferred pharmacy for the 1) Finasteride and the 2) Levothyroxine did not received the refills. [place], [place] [phone]  (Phone)  [phone] (Fax)

## 2017-06-25 ENCOUNTER — Encounter: Payer: Self-pay | Admitting: Family Medicine

## 2017-06-25 ENCOUNTER — Ambulatory Visit (INDEPENDENT_AMBULATORY_CARE_PROVIDER_SITE_OTHER): Payer: Medicare HMO | Admitting: Family Medicine

## 2017-06-25 VITALS — BP 110/80 | HR 40 | Temp 97.6°F | Wt 202.8 lb

## 2017-06-25 DIAGNOSIS — E7800 Pure hypercholesterolemia, unspecified: Secondary | ICD-10-CM

## 2017-06-25 DIAGNOSIS — E038 Other specified hypothyroidism: Secondary | ICD-10-CM

## 2017-06-25 DIAGNOSIS — M1711 Unilateral primary osteoarthritis, right knee: Secondary | ICD-10-CM

## 2017-06-25 DIAGNOSIS — I10 Essential (primary) hypertension: Secondary | ICD-10-CM

## 2017-06-25 NOTE — Progress Notes (Signed)
Subjective:     Patient ID: Daniel Everett, male   DOB: 02/20/1932, 84 y.o.   MRN: [id number]  HPI Patient seen for medical follow-up. He had recent labs through the [place] and these were reviewed. TSH of 5.7. He had TSH here a couple of months ago 3.81. He is compliant with taking his levothyroxin 75 g daily.  Has atrial fibrillation and on eliquis. No recent chest pains. No dizziness. Hypertension treated with amlodipine and HCTZ as well as lisinopril. He is on Lipitor for hyperlipidemia.  History of BPH. Still has occasional slow stream. Has had previous TURP and had some prostate cancer cells and was treated with radiation. He had follow-up PSAs have been near 0.  He's had ongoing right knee problems. Stiffness and pain. Known degenerative arthritis by plain x-rays 08/23/15 .  We discussed possible injection previously. He would like to look at possibilities of this. His wife is very debilitated in a nursing home and he is not interested in knee replacement and this point.  Prefers to explore more conservative options first.  Past Medical History:  Diagnosis Date  . Allergic rhinitis due to pollen 06/07/2008  . Atrial fibrillation ([place])   . Benign essential HTN   . COLONIC POLYPS, HX OF 10/28/2006  . DIVERTICULOSIS, COLON 11/27/2006  . GEN OSTEOARTHROSIS INVOLVING MULTIPLE SITES 01/03/2010  . HYPERGLYCEMIA 10/18/2008  . HYPOTHYROIDISM 11/27/2006  . NEOPLASM, MALIGNANT, PROSTATE, HX OF, S/P TURP 05/28/2007  . OSTEOARTHRITIS 11/27/2006  . WEIGHT LOSS, RECENT 11/27/2006   Past Surgical History:  Procedure Laterality Date  . arthroscopy rt knee    . HYDROCELE EXCISION / REPAIR    . PROSTATE SURGERY     TURP  . ROTATOR CUFF REPAIR      reports that he has never smoked. He has never used smokeless tobacco. He reports that he does not drink alcohol or use drugs. family history includes Cancer in his father; Coronary artery disease in his mother; Heart disease in his mother; Pancreatic cancer in his  father; Tremor in his sister. No Known Allergies    Review of Systems  Constitutional: Negative for fatigue.  Eyes: Negative for visual disturbance.  Respiratory: Negative for cough, chest tightness and shortness of breath.   Cardiovascular: Negative for chest pain, palpitations and leg swelling.  Neurological: Negative for dizziness, syncope, weakness, light-headedness and headaches.       Objective:   Physical Exam  Constitutional: He is oriented to person, place, and time. He appears well-developed and well-nourished.  HENT:  Right Ear: External ear normal.  Left Ear: External ear normal.  Mouth/Throat: Oropharynx is clear and moist.  Eyes: Pupils are equal, round, and reactive to light.  Neck: Neck supple. No thyromegaly present.  Cardiovascular: Normal rate and regular rhythm.  Pulmonary/Chest: Effort normal and breath sounds normal. No respiratory distress. He has no wheezes. He has no rales.  Musculoskeletal: He exhibits no edema.  Neurological: He is alert and oriented to person, place, and time.       Assessment:     #1 hypothyroidism  #2 hypertension stable and at goal  #3 hyperlipidemia  #4 osteoarthritis right knee  #5 nocturia.      Plan:     -Set up sports medicine referral-he would like to discuss possible knee injection -Schedule Medicare wellness visit -Follow-up in 3 months. Consider repeat TSH at that time -Discussed fall prevention  [name] MD [facility]

## 2017-06-25 NOTE — Patient Instructions (Signed)
I would like for you you to schedule a Medicare Annual Wellness Visit (AWV).   This is a yearly appointment with our Health Coach (Susan Hauck, RN) and is designed to develop a personalized prevention plan. This is not a head to toe physical, but rather an opportunity to prevent illness based on your current health and risk factors for disease.   Visits usually last 30-60 minutes and include various screenings for hearing, vision, depression, and dementia, falls, and safety concerns. The visit also includes diet and exercise counseling and information about advance directives.   This is also an opportunity to discuss appropriate health maintenance testing such as mammography, colonoscopy, lung cancer screening, and hepatitis C testing.   The AWV is fully covered by Medicare Part B if:  . You have had Part B for over 12 months, AND . You have not had an AWV in the past 12 months .  Please don't miss out on this opportunity! Set up your appointment today!  

## 2017-07-01 ENCOUNTER — Ambulatory Visit: Payer: Medicare HMO

## 2017-07-03 ENCOUNTER — Ambulatory Visit (INDEPENDENT_AMBULATORY_CARE_PROVIDER_SITE_OTHER): Payer: Medicare HMO | Admitting: Sports Medicine

## 2017-07-03 ENCOUNTER — Encounter: Payer: Self-pay | Admitting: Sports Medicine

## 2017-07-03 ENCOUNTER — Ambulatory Visit: Payer: Self-pay

## 2017-07-03 VITALS — BP 100/62 | HR 60 | Ht 77.0 in | Wt 202.2 lb

## 2017-07-03 DIAGNOSIS — G8929 Other chronic pain: Secondary | ICD-10-CM

## 2017-07-03 DIAGNOSIS — M25561 Pain in right knee: Secondary | ICD-10-CM

## 2017-07-03 NOTE — Progress Notes (Signed)
  [name] Willetta York, [facility] at [facility] [phone]  Daniel Everett - 84 y.o. male MRN [id number]  Date of birth: 07/20/1931  Visit Date: 07/03/2017  PCP: [name], MD   Referred by: [name], MD  Scribe for today's visit: Josepha Pigg, CMA     SUBJECTIVE:  Daniel Everett is here for New Patient (Initial Visit) (R knee pain)  His R knee pain symptoms INITIALLY: Began several years ago and he had XR of the R knee in 2017 which showed degenerative changes.  Described as moderate pain and stiffness, nonradiating Worsened with going up stairs.  Improved with rest Additional associated symptoms include: He reports occasional swelling around the knee but not generally. Pain is medial. He has noticed occasional clicking, popping, and catching. He has noticed some weakness in the R leg. At times he feels the knee may give out on him.     At this time symptoms show no change compared to onset. He has been taking Tylenol with minimal relief. He is currently on Eliquis and unable to take NSAIDs.   ROS Denies night time disturbances. Denies fevers, chills, or night sweats. Denies unexplained weight loss. 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 Denies changes in bowel or bladder habits. Denies recent unreported falls. Denies new or worsening dyspnea or wheezing. Denies headaches or dizziness.  Denies weakness in R leg.  Denies dizziness or presyncopal episodes Denies lower extremity edema    HISTORY & PERTINENT PRIOR DATA:  Prior History reviewed and updated per electronic medical record.  Significant/pertinent history, findings, studies include:  reports that he has never smoked. He has never used smokeless tobacco. No results for input(s): HGBA1C, LABURIC, CREATINE in the last 8760 hours. No specialty comments available. No problems updated.  OBJECTIVE:  VS:  HT:6\' 5"  (195.6 cm)   WT:202 lb 3.2 oz (91.7  kg)  BMI:23.97    BP:100/62  HR:60bpm  TEMP: ( )  RESP:91 %   PHYSICAL EXAM: Constitutional: WDWN, Non-toxic appearing. Psychiatric: Alert & appropriately interactive.  Not depressed or anxious appearing. Respiratory: No increased work of breathing.  Trachea Midline Eyes: Pupils are equal.  EOM intact without nystagmus.  No scleral icterus  Vascular Exam: warm to touch no edema  lower extremity neuro exam: unremarkable normal strength normal sensation normal reflexes  MSK Exam: Right knee has a small effusion.  Ligamentously stable.  Moderate medial and lateral joint line pain.   ASSESSMENT & PLAN:   1. Chronic pain of right knee     PLAN: Ultrasound-guided injection today.  We will plan to get him approved for Visco segmentation and/or Zilretta and follow-up with him in 3 months to perform repeat injections.  Follow-up: Return in about 3 months (around 09/25/2017).      Please see additional documentation for Objective, Assessment and Plan sections. Pertinent additional documentation may be included in corresponding procedure notes, imaging studies, problem based documentation and patient instructions. Please see these sections of the encounter for additional information regarding this visit.  CMA/ATC served as Education administrator during this visit. History, Physical, and Plan performed by medical provider. Documentation and orders reviewed and attested to.      Gerda Diss, [facility] Physician

## 2017-07-03 NOTE — Patient Instructions (Signed)

## 2017-07-08 NOTE — Progress Notes (Addendum)
Subjective:   TAYVEON LOMBARDO is a 84 y.o. male who presents for Medicare Annual (Subsequent) preventive examination.  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 Arnoldo Morale; discussed with him  Hearing Screening Comments: Hearing Not an issues  Vision Screening Comments: Vision checks by dr. Ellie Lunch Glaucoma and she follows this   There are no preventive care reminders to display for this patient. Educated  Regarding shingrix       Objective:     Vitals: BP 112/60   Pulse (!) 55   Ht 6\' 4"  (1.93 m)   Wt 199 lb (90.3 kg)   SpO2 91%   BMI 24.22 kg/m   Body mass index is 24.22 kg/m.  Advanced Directives 07/09/2017 07/09/2017 04/25/2016 03/22/2016  Does Patient Have a Medical Advance Directive? Yes Yes Yes Yes  Type of Advance Directive - - [place];Living will [place]  Does patient want to make changes to medical advance directive? - - No - Patient declined -  Copy of [place] in Chart? - - No - copy requested No - copy requested    Tobacco Social History   Tobacco Use  Smoking Status Never Smoker  Smokeless Tobacco Never Used     Counseling given: Yes   Clinical Intake:     Past Medical History:  Diagnosis Date  . Allergic  rhinitis due to pollen 06/07/2008  . Atrial fibrillation ([place])   . Benign essential HTN   . COLONIC POLYPS, HX OF 10/28/2006  . DIVERTICULOSIS, COLON 11/27/2006  . GEN OSTEOARTHROSIS INVOLVING MULTIPLE SITES 01/03/2010  . HYPERGLYCEMIA 10/18/2008  . HYPOTHYROIDISM 11/27/2006  . NEOPLASM, MALIGNANT, PROSTATE, HX OF, S/P TURP 05/28/2007  . OSTEOARTHRITIS 11/27/2006  . WEIGHT LOSS, RECENT 11/27/2006   Past Surgical History:  Procedure Laterality Date  . arthroscopy rt knee    . HYDROCELE EXCISION / REPAIR    . PROSTATE SURGERY     TURP  . ROTATOR CUFF REPAIR     Family History  Problem Relation Age of Onset  . Coronary artery disease Mother   . Heart disease Mother   . Pancreatic cancer Father   . Cancer Father   . Tremor Sister    Social History   Socioeconomic History  . Marital status: Married    Spouse name: Not on file  . Number of children: Not on file  . Years of education: Not on file  . Highest education level: Not on file  Occupational History  . Occupation: retired  Scientific laboratory technician  . Financial resource strain: Not on file  . Food insecurity:    Worry: Not on file  Inability: Not on file  . Transportation needs:    Medical: Not on file    Non-medical: Not on file  Tobacco Use  . Smoking status: Never Smoker  . Smokeless tobacco: Never Used  Substance and Sexual Activity  . Alcohol use: No  . Drug use: No  . Sexual activity: Yes  Lifestyle  . Physical activity:    Days per week: Not on file    Minutes per session: Not on file  . Stress: Not on file  Relationships  . Social connections:    Talks on phone: Not on file    Gets together: Not on file    Attends religious service: Not on file    Active member of club or organization: Not on file    Attends meetings of clubs or organizations: Not on file    Relationship status: Not on file  Other Topics Concern  . Not on file  Social History Narrative   Lives alone   Caffeine use: very little   Right handed      Outpatient Encounter Medications as of 07/09/2017  Medication Sig  . acetaminophen (TYLENOL) 500 MG tablet Take 500 mg by mouth 2 (two) times daily. Take one tablet twice a day.   Marland Kitchen amLODipine (NORVASC) 5 MG tablet Take 1 tablet (5 mg total) by mouth daily.  Marland Kitchen apixaban (ELIQUIS) 5 MG TABS tablet Take 1 tablet (5 mg total) by mouth 2 (two) times daily.  Marland Kitchen atorvastatin (LIPITOR) 20 MG tablet Take 1 tablet (20 mg total) by mouth daily.  . finasteride (PROSCAR) 5 MG tablet Take 1 tablet (5 mg total) by mouth daily.  . hydrochlorothiazide (MICROZIDE) 12.5 MG capsule Take 1 capsule (12.5 mg total) by mouth daily.  Marland Kitchen latanoprost (XALATAN) 0.005 % ophthalmic solution Place 1 drop into both eyes at bedtime.   Marland Kitchen levothyroxine (SYNTHROID, LEVOTHROID) 75 MCG tablet Take 1 tablet (75 mcg total) by mouth daily.  Marland Kitchen lisinopril (PRINIVIL,ZESTRIL) 20 MG tablet Take 1 tablet (20 mg total) by mouth daily.  . Multiple Vitamin (MULTIVITAMIN) tablet Take 1 tablet by mouth daily.    . Pyridoxine HCl (VITAMIN B-6) 500 MG tablet Take 500 mg by mouth daily.  . ranitidine (ZANTAC) 150 MG tablet Take 1 tablet (150 mg total) by mouth 2 (two) times daily.  . tamsulosin (FLOMAX) 0.4 MG CAPS capsule Take 0.4 mg by mouth daily.   . timolol (TIMOPTIC) 0.5 % ophthalmic solution Place 1 drop into both eyes 2 (two) times daily.     No facility-administered encounter medications on file as of 07/09/2017.     Activities of Daily Living In your present state of health, do you have any difficulty performing the following activities: 07/09/2017  Hearing? N  Vision? N  Difficulty concentrating or making decisions? N  Walking or climbing stairs? Y  Comment does have knee issues;   Dressing or bathing? N  Doing errands, shopping? N  Preparing Food and eating ? N  Using the Toilet? N  In the past six months, have you accidently leaked urine? N  Comment goes to a urologist; goes back several years   Do you have problems with  loss of bowel control? N  Managing your Medications? N  Managing your Finances? N  Housekeeping or managing your Housekeeping? N  Some recent data might be hidden    Patient Care Team: [name], MD as PCP - General (Family Medicine)  uses the [facility] for meds and other  Goes  to [facility] to Urologist for TURP and found CANCER Went to oncologist - and decided to have radiation x 8 weeks  Dr. Amalia Hailey and now goes to [facility] and seeds Dr. Alyson Ingles  Now the [facility] is checking PSA  Goes to Dermatology on a regular basis       Assessment:   This is a routine wellness examination for [place].  Exercise Activities and Dietary recommendations Current Exercise Habits: Home exercise routine, Type of exercise: walking(light exercise; [facility]  Goals    . Exercise 150 min/wk Moderate Activity     [facility] or other Would like to work on balance  May go after you visit wife at 6:30         Fall Risk Fall Risk  07/09/2017 06/25/2017 11/28/2015 10/14/2014 08/08/2014  Falls in the past year? No No No No Yes  Number falls in past yr: - - - - 1  Injury with Fall? - - - - Yes  Follow up - - - - Education provided   Slipped in yard over a year ago; was in his yard that has a slope in the back. Continues with yard work   Depression Screen PHQ 2/9 Scores 07/09/2017 07/09/2017 06/25/2017 11/28/2015  PHQ - 2 Score 0 0 0 0  PHQ- 9 Score - - - -     Cognitive Function MMSE - Mini Mental State Exam 07/09/2017  Not completed: (No Data)   Went to a neurologist; has a slight tremor Familiar tremors; if he picks up something the tremor will start Was going to do an MRI but insurance did not pay for it  One is scheduled soon       Immunization History  Administered Date(s) Administered  . Influenza Split 12/31/2010, 12/17/2011  . Influenza Whole 03/25/2001, 01/28/2007, 12/10/2007, 02/08/2009, 01/03/2010  . Influenza, High Dose  Seasonal PF 02/08/2015, 11/28/2015, 12/25/2016  . Influenza,inj,Quad PF,6+ Mos 02/07/2014  . Pneumococcal Conjugate-13 02/07/2014  . Pneumococcal Polysaccharide-23 03/25/2001, 06/24/2012  . Td 03/25/2001, 06/24/2012  . Zoster 03/25/2010     Screening Tests Health Maintenance  Topic Date Due  . INFLUENZA VACCINE  10/23/2017  . TETANUS/TDAP  06/25/2022  . 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 Medical and social history . Use of alcohol, tobacco or illicit drugs  . Current medications and supplements . Functional ability and status . Nutritional status . Physical activity . Advanced directives . List of other physicians . Hospitalizations, surgeries, and ER visits in previous 12 months . Vitals . Screenings to include cognitive, depression, and falls . Referrals and appointments  In addition, I have reviewed and discussed with patient certain preventive protocols, quality metrics, and best practice recommendations. A written personalized care plan for preventive services as well as general preventive health recommendations  were provided to patient.     EMLJQ,GBEEF, RN  07/09/2017  Reviewed above notes and agree.    [name] MD [facility] at [facility]

## 2017-07-09 ENCOUNTER — Ambulatory Visit (INDEPENDENT_AMBULATORY_CARE_PROVIDER_SITE_OTHER): Payer: Medicare HMO

## 2017-07-09 VITALS — BP 112/60 | HR 55 | Ht 76.0 in | Wt 199.0 lb

## 2017-07-09 DIAGNOSIS — Z0000 Encounter for general adult medical examination without abnormal findings: Secondary | ICD-10-CM | POA: Diagnosis not present

## 2017-07-09 NOTE — Patient Instructions (Addendum)
Daniel Everett , Thank you for taking time to come for your Medicare Wellness Visit. I appreciate your ongoing commitment to your health goals. Please review the following plan we discussed and let me know if I can assist you in the future.   Will need to find a dermatologist per [employer].  Shingrix is a vaccine for the prevention of Shingles in Adults 50 and older.  If you are on Medicare, the shingrix is covered under your Part D plan, so you will take both of the vaccines in the series at your pharmacy. Please check with your benefits regarding applicable copays or out of pocket expenses.  The Shingrix is given in 2 vaccines approx 8 weeks apart. You must receive the 2nd dose prior to 6 months from receipt of the first. Please have the pharmacist print out you Immunization  dates for our office records    These are the goals we discussed: Goals    . Exercise 150 min/wk Moderate Activity     [facility] or other Would like to work on balance  May go after you visit wife at 6:30         This is a list of the screening recommended for you and due dates:  Health Maintenance  Topic Date Due  . Flu Shot  10/23/2017  . Tetanus Vaccine  06/25/2022  . Pneumonia vaccines  Completed   Prevention of falls: Remove rugs or any tripping hazards in the home Use Non slip mats in bathtubs and showers Placing grab bars next to the toilet and or shower Placing handrails on both sides of the stair way Adding extra lighting in the home.   Personal safety issues reviewed:  1. Consider starting a community watch program per [facility] 2.  Changes batteries is smoke detector and/or carbon monoxide detector  3.  If you have firearms; keep them in a safe place 4.  Wear protection when in the sun; Always wear sunscreen or a hat; It is good to have your doctor check your skin annually or review any new areas of concern 5. Driving safety; Keep in the right lane; stay 3 car  lengths behind the car in front of you on the highway; look 3 times prior to pulling out; carry your cell phone everywhere you go!     Fall Prevention in the Home Falls can cause injuries. They can happen to people of all ages. There are many things you can do to make your home safe and to help prevent falls. What can I do on the outside of my home?  Regularly fix the edges of walkways and driveways and fix any cracks.  Remove anything that might make you trip as you walk through a door, such as a raised step or threshold.  Trim any bushes or trees on the path to your home.  Use bright outdoor lighting.  Clear any walking paths of anything that might make someone trip, such as rocks or tools.  Regularly check to see if handrails are loose or broken. Make sure that both sides of any steps have handrails.  Any raised decks and porches should have guardrails on the edges.  Have any leaves, snow, or ice cleared regularly.  Use sand or salt on walking paths during winter.  Clean up any spills in your garage right away. This includes oil or grease spills. What can I do in the bathroom?  Use night lights.  Install grab  bars by the toilet and in the tub and shower. Do not use towel bars as grab bars.  Use non-skid mats or decals in the tub or shower.  If you need to sit down in the shower, use a plastic, non-slip stool.  Keep the floor dry. Clean up any water that spills on the floor as soon as it happens.  Remove soap buildup in the tub or shower regularly.  Attach bath mats securely with double-sided non-slip rug tape.  Do not have throw rugs and other things on the floor that can make you trip. What can I do in the bedroom?  Use night lights.  Make sure that you have a light by your bed that is easy to reach.  Do not use any sheets or blankets that are too big for your bed. They should not hang down onto the floor.  Have a firm chair that has side arms. You can use this  for support while you get dressed.  Do not have throw rugs and other things on the floor that can make you trip. What can I do in the kitchen?  Clean up any spills right away.  Avoid walking on wet floors.  Keep items that you use a lot in easy-to-reach places.  If you need to reach something above you, use a strong step stool that has a grab bar.  Keep electrical cords out of the way.  Do not use floor polish or wax that makes floors slippery. If you must use wax, use non-skid floor wax.  Do not have throw rugs and other things on the floor that can make you trip. What can I do with my stairs?  Do not leave any items on the stairs.  Make sure that there are handrails on both sides of the stairs and use them. Fix handrails that are broken or loose. Make sure that handrails are as long as the stairways.  Check any carpeting to make sure that it is firmly attached to the stairs. Fix any carpet that is loose or worn.  Avoid having throw rugs at the top or bottom of the stairs. If you do have throw rugs, attach them to the floor with carpet tape.  Make sure that you have a light switch at the top of the stairs and the bottom of the stairs. If you do not have them, ask someone to add them for you. What else can I do to help prevent falls?  Wear shoes that: ? Do not have high heels. ? Have rubber bottoms. ? Are comfortable and fit you well. ? Are closed at the toe. Do not wear sandals.  If you use a stepladder: ? Make sure that it is fully opened. Do not climb a closed stepladder. ? Make sure that both sides of the stepladder are locked into place. ? Ask someone to hold it for you, if possible.  Clearly mark and make sure that you can see: ? Any grab bars or handrails. ? First and last steps. ? Where the edge of each step is.  Use tools that help you move around (mobility aids) if they are needed. These include: ? Canes. ? Walkers. ? Scooters. ? Crutches.  Turn on the  lights when you go into a dark area. Replace any light bulbs as soon as they burn out.  Set up your furniture so you have a clear path. Avoid moving your furniture around.  If any of your floors are uneven, fix  them.  If there are any pets around you, be aware of where they are.  Review your medicines with your doctor. Some medicines can make you feel dizzy. This can increase your chance of falling. Ask your doctor what other things that you can do to help prevent falls. This information is not intended to replace advice given to you by your health care provider. Make sure you discuss any questions you have with your health care provider. Document Released: 01/05/2009 Document Revised: 08/17/2015 Document Reviewed: 04/15/2014 Elsevier Interactive Patient Education  2018 [place] Maintenance, Male A healthy lifestyle and preventive care is important for your health and wellness. Ask your health care provider about what schedule of regular examinations is right for you. What should I know about weight and diet? Eat a Healthy Diet  Eat plenty of vegetables, fruits, whole grains, low-fat dairy products, and lean protein.  Do not eat a lot of foods high in solid fats, added sugars, or salt.  Maintain a Healthy Weight Regular exercise can help you achieve or maintain a healthy weight. You should:  Do at least 150 minutes of exercise each week. The exercise should increase your heart rate and make you sweat (moderate-intensity exercise).  Do strength-training exercises at least twice a week.  Watch Your Levels of Cholesterol and Blood Lipids  Have your blood tested for lipids and cholesterol every 5 years starting at 82 years of age. If you are at high risk for heart disease, you should start having your blood tested when you are 82 years old. You may need to have your cholesterol levels checked more often if: ? Your lipid or cholesterol levels are high. ? You are older  than 82 years of age. ? You are at high risk for heart disease.  What should I know about cancer screening? Many types of cancers can be detected early and may often be prevented. Lung Cancer  You should be screened every year for lung cancer if: ? You are a current smoker who has smoked for at least 30 years. ? You are a former smoker who has quit within the past 15 years.  Talk to your health care provider about your screening options, when you should start screening, and how often you should be screened.  Colorectal Cancer  Routine colorectal cancer screening usually begins at 82 years of age and should be repeated every 5-10 years until you are 82 years old. You may need to be screened more often if early forms of precancerous polyps or small growths are found. Your health care provider may recommend screening at an earlier age if you have risk factors for colon cancer.  Your health care provider may recommend using home test kits to check for hidden blood in the stool.  A small camera at the end of a tube can be used to examine your colon (sigmoidoscopy or colonoscopy). This checks for the earliest forms of colorectal cancer.  Prostate and Testicular Cancer  Depending on your age and overall health, your health care provider may do certain tests to screen for prostate and testicular cancer.  Talk to your health care provider about any symptoms or concerns you have about testicular or prostate cancer.  Skin Cancer  Check your skin from head to toe regularly.  Tell your health care provider about any new moles or changes in moles, especially if: ? There is a change in a mole's size, shape, or color. ? You have a mole  that is larger than a pencil eraser.  Always use sunscreen. Apply sunscreen liberally and repeat throughout the day.  Protect yourself by wearing long sleeves, pants, a wide-brimmed hat, and sunglasses when outside.  What should I know about heart disease,  diabetes, and high blood pressure?  If you are 92-12 years of age, have your blood pressure checked every 3-5 years. If you are 47 years of age or older, have your blood pressure checked every year. You should have your blood pressure measured twice-once when you are at a hospital or clinic, and once when you are not at a hospital or clinic. Record the average of the two measurements. To check your blood pressure when you are not at a hospital or clinic, you can use: ? An automated blood pressure machine at a pharmacy. ? A home blood pressure monitor.  Talk to your health care provider about your target blood pressure.  If you are between 60-68 years old, ask your health care provider if you should take aspirin to prevent heart disease.  Have regular diabetes screenings by checking your fasting blood sugar level. ? If you are at a normal weight and have a low risk for diabetes, have this test once every three years after the age of 55. ? If you are overweight and have a high risk for diabetes, consider being tested at a younger age or more often.  A one-time screening for abdominal aortic aneurysm (AAA) by ultrasound is recommended for men aged 90-75 years who are current or former smokers. What should I know about preventing infection? Hepatitis B If you have a higher risk for hepatitis B, you should be screened for this virus. Talk with your health care provider to find out if you are at risk for hepatitis B infection. Hepatitis C Blood testing is recommended for:  Everyone born from [birth year] through 1963.  Anyone with known risk factors for hepatitis C.  Sexually Transmitted Diseases (STDs)  You should be screened each year for STDs including gonorrhea and chlamydia if: ? You are sexually active and are younger than 82 years of age. ? You are older than 82 years of age and your health care provider tells you that you are at risk for this type of infection. ? Your sexual activity has  changed since you were last screened and you are at an increased risk for chlamydia or gonorrhea. Ask your health care provider if you are at risk.  Talk with your health care provider about whether you are at high risk of being infected with HIV. Your health care provider may recommend a prescription medicine to help prevent HIV infection.  What else can I do?  Schedule regular health, dental, and eye exams.  Stay current with your vaccines (immunizations).  Do not use any tobacco products, such as cigarettes, chewing tobacco, and e-cigarettes. If you need help quitting, ask your health care provider.  Limit alcohol intake to no more than 2 drinks per day. One drink equals 12 ounces of beer, 5 ounces of wine, or 1 ounces of hard liquor.  Do not use street drugs.  Do not share needles.  Ask your health care provider for help if you need support or information about quitting drugs.  Tell your health care provider if you often feel depressed.  Tell your health care provider if you have ever been abused or do not feel safe at home. This information is not intended to replace advice given to you by your  health care provider. Make sure you discuss any questions you have with your health care provider. Document Released: 09/07/2007 Document Revised: 11/08/2015 Document Reviewed: 12/13/2014 Elsevier Interactive Patient Education  [employer].

## 2017-07-11 ENCOUNTER — Encounter: Payer: Self-pay | Admitting: Family Medicine

## 2017-07-15 DIAGNOSIS — H409 Unspecified glaucoma: Secondary | ICD-10-CM | POA: Diagnosis not present

## 2017-07-15 DIAGNOSIS — I4891 Unspecified atrial fibrillation: Secondary | ICD-10-CM | POA: Diagnosis not present

## 2017-07-15 DIAGNOSIS — R32 Unspecified urinary incontinence: Secondary | ICD-10-CM | POA: Diagnosis not present

## 2017-07-15 DIAGNOSIS — I10 Essential (primary) hypertension: Secondary | ICD-10-CM | POA: Diagnosis not present

## 2017-07-15 DIAGNOSIS — E039 Hypothyroidism, unspecified: Secondary | ICD-10-CM | POA: Diagnosis not present

## 2017-07-15 DIAGNOSIS — K219 Gastro-esophageal reflux disease without esophagitis: Secondary | ICD-10-CM | POA: Diagnosis not present

## 2017-07-15 DIAGNOSIS — M1990 Unspecified osteoarthritis, unspecified site: Secondary | ICD-10-CM | POA: Diagnosis not present

## 2017-07-15 DIAGNOSIS — G8929 Other chronic pain: Secondary | ICD-10-CM | POA: Diagnosis not present

## 2017-07-15 DIAGNOSIS — E785 Hyperlipidemia, unspecified: Secondary | ICD-10-CM | POA: Diagnosis not present

## 2017-07-15 DIAGNOSIS — N400 Enlarged prostate without lower urinary tract symptoms: Secondary | ICD-10-CM | POA: Diagnosis not present

## 2017-07-29 ENCOUNTER — Telehealth: Payer: Self-pay | Admitting: Neurology

## 2017-07-29 NOTE — Telephone Encounter (Signed)
I received a disc of MRI of the brain that was done on 11 July 2017.  I do not see written report of the study, not sure who ordered this study.  This shows some generalized moderate cortical atrophy and mild white matter changes in the deep white matter, predominantly in the frontal areas.

## 2017-07-30 DIAGNOSIS — H02051 Trichiasis without entropian right upper eyelid: Secondary | ICD-10-CM | POA: Diagnosis not present

## 2017-07-30 DIAGNOSIS — H16101 Unspecified superficial keratitis, right eye: Secondary | ICD-10-CM | POA: Diagnosis not present

## 2017-07-30 DIAGNOSIS — H02052 Trichiasis without entropian right lower eyelid: Secondary | ICD-10-CM | POA: Diagnosis not present

## 2017-07-30 DIAGNOSIS — H01001 Unspecified blepharitis right upper eyelid: Secondary | ICD-10-CM | POA: Diagnosis not present

## 2017-07-31 ENCOUNTER — Encounter: Payer: Self-pay | Admitting: Sports Medicine

## 2017-07-31 NOTE — Procedures (Signed)
PROCEDURE NOTE:  Ultrasound Guided: Injection: Right knee Images were obtained and interpreted by myself, Teresa Coombs, DO  Images have been saved and stored to PACS system. Images obtained on: GE S7 Ultrasound machine    ULTRASOUND FINDINGS:  Moderate synovitis, small effusion.  DESCRIPTION OF PROCEDURE:  The patient's clinical condition is marked by substantial pain and/or significant functional disability. Other conservative therapy has not provided relief, is contraindicated, or not appropriate. There is a reasonable likelihood that injection will significantly improve the patient's pain and/or functional impairment.   After discussing the risks, benefits and expected outcomes of the injection and all questions were reviewed and answered, the patient wished to undergo the above named procedure.  Verbal consent was obtained.  The ultrasound was used to identify the target structure and adjacent neurovascular structures. The skin was then prepped in sterile fashion and the target structure was injected under direct visualization using sterile technique as below:  PREP: Alcohol and Ethel Chloride APPROACH: superiolateral, single injection, 25g 1.5 in. INJECTATE: 2 cc 0.5% Marcaine and 2 cc 40mg /mL DepoMedrol ASPIRATE: None DRESSING: Band-Aid  Post procedural instructions including recommending icing and warning signs for infection were reviewed.    This procedure was well tolerated and there were no complications.   IMPRESSION: Succesful Ultrasound Guided: Injection

## 2017-08-08 ENCOUNTER — Telehealth: Payer: Self-pay | Admitting: Family Medicine

## 2017-08-08 NOTE — Telephone Encounter (Signed)
LMVM to reschedule/PCP in meeting

## 2017-09-08 ENCOUNTER — Other Ambulatory Visit: Payer: Self-pay | Admitting: Cardiology

## 2017-09-08 MED ORDER — RANITIDINE HCL 150 MG PO TABS: 150.0000 mg | ORAL_TABLET | Freq: Two times a day (BID) | ORAL | 3 refills | Status: DC

## 2017-09-08 NOTE — Telephone Encounter (Signed)
°*  STAT* If patient is at the pharmacy, call can be transferred to refill team.   1. Which medications need to be refilled? (please list name of each medication and dose if known) need a new prescription called local until his mail order gets her-Ranitidine  2. Which pharmacy/location (including street and city if local pharmacy) is medication to be sent to? [facility]  3. Do they need a 30 day or 90 day supply? [place]

## 2017-09-11 DIAGNOSIS — N401 Enlarged prostate with lower urinary tract symptoms: Secondary | ICD-10-CM | POA: Diagnosis not present

## 2017-09-11 DIAGNOSIS — N3281 Overactive bladder: Secondary | ICD-10-CM | POA: Diagnosis not present

## 2017-09-23 ENCOUNTER — Ambulatory Visit: Payer: Medicare HMO | Admitting: Family Medicine

## 2017-09-26 ENCOUNTER — Encounter: Payer: Self-pay | Admitting: Family Medicine

## 2017-09-26 ENCOUNTER — Ambulatory Visit (INDEPENDENT_AMBULATORY_CARE_PROVIDER_SITE_OTHER): Payer: Medicare HMO | Admitting: Family Medicine

## 2017-09-26 VITALS — BP 122/78 | HR 48 | Temp 97.5°F | Wt 197.3 lb

## 2017-09-26 DIAGNOSIS — E038 Other specified hypothyroidism: Secondary | ICD-10-CM

## 2017-09-26 DIAGNOSIS — E871 Hypo-osmolality and hyponatremia: Secondary | ICD-10-CM | POA: Diagnosis not present

## 2017-09-26 DIAGNOSIS — R5383 Other fatigue: Secondary | ICD-10-CM

## 2017-09-26 LAB — BASIC METABOLIC PANEL
BUN: 11 mg/dL (ref 6–23)
CALCIUM: 9 mg/dL (ref 8.4–10.5)
CO2: 30 mEq/L (ref 19–32)
Chloride: 99 mEq/L (ref 96–112)
Creatinine, Ser: 0.88 mg/dL (ref 0.40–1.50)
GFR: 87.68 mL/min (ref >60.00)
Glucose, Bld: 113 mg/dL — ABNORMAL HIGH (ref 70–99)
Potassium: 4.3 mEq/L (ref 3.5–5.1)
SODIUM: 136 meq/L (ref 135–145)

## 2017-09-26 LAB — CBC WITH DIFFERENTIAL/PLATELET
BASOS ABS: 0 10*3/uL (ref 0.0–0.1)
Basophils Relative: 0.7 % (ref 0.0–3.0)
EOS PCT: 1.1 % (ref 0.0–5.0)
Eosinophils Absolute: 0 10*3/uL (ref 0.0–0.7)
HEMATOCRIT: 43.6 % (ref 39.0–52.0)
HEMOGLOBIN: 15.2 g/dL (ref 13.0–17.0)
LYMPHS PCT: 32.3 % (ref 12.0–46.0)
Lymphs Abs: 1.4 10*3/uL (ref 0.7–4.0)
MCHC: 34.9 g/dL (ref 30.0–36.0)
MCV: 95.3 fl (ref 78.0–100.0)
Monocytes Absolute: 0.4 10*3/uL (ref 0.1–1.0)
Monocytes Relative: 8.8 % (ref 3.0–12.0)
Neutro Abs: 2.5 10*3/uL (ref 1.4–7.7)
Neutrophils Relative %: 57.1 % (ref 43.0–77.0)
Platelets: 183 10*3/uL (ref 150.0–400.0)
RBC: 4.58 Mil/uL (ref 4.22–5.81)
RDW: 13.9 % (ref 11.5–15.5)
WBC: 4.3 10*3/uL (ref 4.0–10.5)

## 2017-09-26 LAB — TSH: TSH: 2.17 u[IU]/mL (ref 0.35–4.50)

## 2017-09-26 LAB — T4, FREE: FREE T4: 1.35 ng/dL (ref 0.60–1.60)

## 2017-09-26 NOTE — Patient Instructions (Signed)

## 2017-09-26 NOTE — Progress Notes (Signed)
  Subjective:     Patient ID: Daniel Everett, male   DOB: 05/09/1931, 84 y.o.   MRN: [id number]  HPI Patient has multiple chronic problems including history of atrial fibrillation, hypertension, GERD, hypothyroidism, essential tremor, osteoarthritis, hyperlipidemia, history of prostate cancer. He had ongoing issues with this right knee especially with osteoarthritis. He's had some injections and thus far not much improvement.  Main complaint is fatigue. He's had some increasing fatigue and generalized weakness over the past several weeks if not months. He had TSH at the [place] back in March with 5.71. Slightly low sodium. Medications reviewed. He has hypothyroidism on replacement. He states he is compliant with all medications.  Denies any recent syncope. No chest pains. No dyspnea. No fever. He has some urine urgency and BPH symptoms and remains on finasteride. Was seen recently by urology.  Past Medical History:  Diagnosis Date  . Allergic rhinitis due to pollen 06/07/2008  . Atrial fibrillation ([place])   . Benign essential HTN   . COLONIC POLYPS, HX OF 10/28/2006  . DIVERTICULOSIS, COLON 11/27/2006  . GEN OSTEOARTHROSIS INVOLVING MULTIPLE SITES 01/03/2010  . HYPERGLYCEMIA 10/18/2008  . HYPOTHYROIDISM 11/27/2006  . NEOPLASM, MALIGNANT, PROSTATE, HX OF, S/P TURP 05/28/2007  . OSTEOARTHRITIS 11/27/2006  . WEIGHT LOSS, RECENT 11/27/2006   Past Surgical History:  Procedure Laterality Date  . arthroscopy rt knee    . HYDROCELE EXCISION / REPAIR    . PROSTATE SURGERY     TURP  . ROTATOR CUFF REPAIR      reports that he has never smoked. He has never used smokeless tobacco. He reports that he does not drink alcohol or use drugs. family history includes Cancer in his father; Coronary artery disease in his mother; Heart disease in his mother; Pancreatic cancer in his father; Tremor in his sister. No Known Allergies   Review of Systems  Constitutional: Positive for fatigue. Negative for chills and fever.   Respiratory: Negative for cough and shortness of breath.   Cardiovascular: Negative for chest pain, palpitations and leg swelling.  Gastrointestinal: Negative for abdominal pain, blood in stool, nausea and vomiting.  Genitourinary: Negative for dysuria.  Musculoskeletal: Positive for arthralgias.  Neurological: Negative for dizziness and syncope.  Psychiatric/Behavioral: Negative for confusion.       Objective:   Physical Exam  Constitutional: He is oriented to person, place, and time. He appears well-developed and well-nourished.  HENT:  Mouth/Throat: Oropharynx is clear and moist.  Neck: Neck supple.  Cardiovascular: Normal rate.  Pulmonary/Chest: Effort normal and breath sounds normal.  Musculoskeletal: He exhibits no edema.  He has support hose on. No pitting edema  Neurological: He is alert and oriented to person, place, and time. No cranial nerve deficit.       Assessment:     #1 complaints of general fatigue. Question combination of decreased activity from his arthritis recently in addition to the heat.  Does have hypothyroidism and recently under replaced with [facility]  #2 hypothyroidism as above  #3 recent mild hyponatremia by [facility] labs.   patient on thiazide      Plan:     -Recheck labs with TSH, free T4, CBC, basic metabolic panel -We recommend he try to establish getting back to more consistent exercise soon -Increase hydration and avoid hottest part of day for outdoor activities  [name] MD [facility] at [facility]

## 2017-09-30 ENCOUNTER — Telehealth: Payer: Self-pay | Admitting: Family Medicine

## 2017-09-30 NOTE — Telephone Encounter (Signed)
Patient called to receive lab results and he says "I had an MRI done by the [place] and had the report sent to Dr. Elease Hashimoto and my neurologists, Dr. Jannifer Franklin. I have a slight tremor and want to know if the MRI showed up anything indicating what is causing the tremor? Should I see Dr. Elease Hashimoto about the tremor or Dr. Jannifer Franklin?" I advised I will send this to Dr. Elease Hashimoto and someone will call back with his recommendation, patient verbalized understanding.

## 2017-10-01 NOTE — Telephone Encounter (Signed)
Nothing from MRI to explain tremor.  I think Dr Jannifer Franklin would be good to see regarding his tremor.

## 2017-10-01 NOTE — Telephone Encounter (Signed)
Called patient and discussed Dr. Anastasio Auerbach advice. Patient stated he would contact Dr. Jannifer Franklin. Nothing further needed.

## 2017-10-02 ENCOUNTER — Ambulatory Visit (INDEPENDENT_AMBULATORY_CARE_PROVIDER_SITE_OTHER): Payer: Medicare HMO | Admitting: Sports Medicine

## 2017-10-02 ENCOUNTER — Ambulatory Visit: Payer: Self-pay

## 2017-10-02 ENCOUNTER — Ambulatory Visit (INDEPENDENT_AMBULATORY_CARE_PROVIDER_SITE_OTHER): Payer: Medicare HMO

## 2017-10-02 ENCOUNTER — Encounter: Payer: Self-pay | Admitting: Sports Medicine

## 2017-10-02 VITALS — BP 118/70 | HR 40 | Ht 76.0 in | Wt 198.0 lb

## 2017-10-02 DIAGNOSIS — M25561 Pain in right knee: Principal | ICD-10-CM

## 2017-10-02 DIAGNOSIS — M1711 Unilateral primary osteoarthritis, right knee: Secondary | ICD-10-CM

## 2017-10-02 DIAGNOSIS — G8929 Other chronic pain: Secondary | ICD-10-CM

## 2017-10-02 NOTE — Progress Notes (Signed)
Daniel Everett. Daniel Everett, [place] at [facility] [phone]  Daniel Everett - 84 y.o. male MRN [id number]  Date of birth: [birth year]  Visit Date: 10/02/2017  PCP: Daniel Post, MD   Referred by: Daniel Post, MD  Scribe(s) for today's visit: Daniel Everett, CMA  SUBJECTIVE:  Daniel Everett is here for Follow-up (R knee pain)   07/03/2017: His R knee pain symptoms INITIALLY: Began several years ago and he had XR of the R knee in 2017 which showed degenerative changes.  Described as moderate pain and stiffness, nonradiating Worsened with going up stairs.  Improved with rest Additional associated symptoms include: He reports occasional swelling around the knee but not generally. Pain is medial. He has noticed occasional clicking, popping, and catching. He has noticed some weakness in the R leg. At times he feels the knee may give out on him.    At this time symptoms show no change compared to onset. He has been taking Tylenol with minimal relief. He is currently on Eliquis and unable to take NSAIDs.   10/02/2017: Compared to the last office visit, his previously described symptoms are improving, pain has improved but he continues to have weakness in the R knee. He feels like there is more swelling and stiffness around the knee which is affecting his ROM. He feels like over all there hasn't been much change with his knee.  Current symptoms are mild & are nonradiating He has been taking Tylenol with some relief. He received steroid injection 07/03/2017 and tolerated well.  He notes that he was feeling unsteady on his feet so his urologist took him off of Flomax. He says that it hasn't been long enough to tell if its helping.    REVIEW OF SYSTEMS: Denies night time disturbances. Denies fevers, chills, or night sweats. Denies unexplained weight loss. Reports personal history of cancer, prostate. Denies changes in bowel or bladder  habits. Denies recent unreported falls. Denies new or worsening dyspnea or wheezing. Denies headaches or dizziness.  Reports weakness in RLE Denies dizziness or presyncopal episodes Reports lower extremity edema    HISTORY & PERTINENT PRIOR DATA:  Prior History reviewed and updated per electronic medical record.  Significant/pertinent history, findings, studies include:  reports that he has never smoked. He has never used smokeless tobacco. No results for input(s): HGBA1C, LABURIC, CREATINE in the last 8760 hours. 08/05/17 - [place] - 20% coinsurance with a $25.00 copay. No problems updated.  OBJECTIVE:  VS:  HT:6\' 4"  (193 cm)   WT:198 lb (89.8 kg)  BMI:24.11    BP:118/70  HR:(!) 40bpm  TEMP: ( )  RESP:98 %   PHYSICAL EXAM: Constitutional: WDWN, Non-toxic appearing. Psychiatric: Alert & appropriately interactive.  Not depressed or anxious appearing. Respiratory: No increased work of breathing.  Trachea Midline Eyes: Pupils are equal.  EOM intact without nystagmus.  No scleral icterus  Vascular Exam: warm to touch no edema  lower extremity neuro exam: unremarkable  MSK Exam: His right knee is slightly arthritic appearing with generalized bossing and slight genu varus.  Extensor mechanism strength intact.  He does have some difficulty with ambulation.   ASSESSMENT & PLAN:   1. Chronic pain of right knee   2. Primary osteoarthritis of right knee     PLAN: We discussed repeating injections but overall he feels weakness is his bigger issue and he would like to work with physical therapy.  Referral has been placed for this.  Can consider repeat injections in the future but will defer to them at this time.  Follow-up: Return in about 8 weeks (around 11/27/2017) for repeat clinical exam.      Please see additional documentation for Objective, Assessment and Plan sections. Pertinent additional documentation may be included in corresponding procedure notes, imaging studies,  problem based documentation and patient instructions. Please see these sections of the encounter for additional information regarding this visit.  CMA/ATC served as Education administrator during this visit. History, Physical, and Plan performed by medical provider. Documentation and orders reviewed and attested to.      Daniel Everett, [place] [facility] Physician

## 2017-10-02 NOTE — Patient Instructions (Signed)
We have referred you to PT at [facility]

## 2017-10-09 ENCOUNTER — Encounter: Payer: Self-pay | Admitting: Physical Therapy

## 2017-10-09 ENCOUNTER — Ambulatory Visit: Payer: Medicare HMO | Admitting: Physical Therapy

## 2017-10-09 DIAGNOSIS — G8929 Other chronic pain: Secondary | ICD-10-CM

## 2017-10-09 DIAGNOSIS — M6281 Muscle weakness (generalized): Secondary | ICD-10-CM

## 2017-10-09 DIAGNOSIS — M25561 Pain in right knee: Secondary | ICD-10-CM

## 2017-10-09 NOTE — Therapy (Signed)
[facility] [address] [place], [place], [address] Phone: [phone]   Fax:  [phone]  Physical Therapy Evaluation  Patient Details  Name: Daniel Everett MRN: [id number] Date of Birth: 10/31/1931 Referring Provider: Teresa Coombs   Encounter Date: 10/09/2017  PT End of Session - 10/09/17 1307    Visit Number  1    Number of Visits  4    Date for PT Re-Evaluation  11/06/17    Authorization Type  AETNA    PT Start Time  0845    PT Stop Time  0928    PT Time Calculation (min)  43 min    Activity Tolerance  Patient tolerated treatment well    Behavior During Therapy  [facility] for tasks assessed/performed       Past Medical History:  Diagnosis Date  . Allergic rhinitis due to pollen 06/07/2008  . Atrial fibrillation ([place])   . Benign essential HTN   . COLONIC POLYPS, HX OF 10/28/2006  . DIVERTICULOSIS, COLON 11/27/2006  . GEN OSTEOARTHROSIS INVOLVING MULTIPLE SITES 01/03/2010  . HYPERGLYCEMIA 10/18/2008  . HYPOTHYROIDISM 11/27/2006  . NEOPLASM, MALIGNANT, PROSTATE, HX OF, S/P TURP 05/28/2007  . OSTEOARTHRITIS 11/27/2006  . WEIGHT LOSS, RECENT 11/27/2006    Past Surgical History:  Procedure Laterality Date  . arthroscopy rt knee    . HYDROCELE EXCISION / REPAIR    . PROSTATE SURGERY     TURP  . ROTATOR CUFF REPAIR      There were no vitals filed for this visit.   Subjective Assessment - 10/09/17 0848    Subjective  Pt states ongoing pain in R knee. States L knee hurts at times, but R is worse.  He had recent injection on R , states minimal improvement. + X-ray for degeneration. He wants to start exercising more, wants to learn appropriate exercises and start gym membership. He drives part time for work.     Limitations  Standing;Writing;House hold activities;Lifting    Currently in Pain?  Yes    Pain Score  5     Pain Location  Knee    Pain Orientation  Right    Pain Descriptors / Indicators  Aching    Pain Type  Chronic pain    Pain Onset  More than a month ago    Pain Frequency  Intermittent         [facility] PT Assessment - 10/09/17 0001      Assessment   Medical Diagnosis  R knee pain    Referring Provider  Teresa Coombs    Prior Therapy  No      Precautions   Precautions  None      Balance Screen   Has the patient fallen in the past 6 months  No      Prior Function   Level of Independence  Independent      Cognition   Overall Cognitive Status  Within Functional Limits for tasks assessed      ROM / Strength   AROM / PROM / Strength  AROM;Strength      AROM   AROM Assessment Site  Knee    Right/Left Knee  Right;Left    Right Knee Extension  -10    Right Knee Flexion  125      Strength   Overall Strength Comments  R knee: 4/5 ;  L Knee: 4+/5      Ambulation/Gait   Gait Comments  Standing static balance: WNL;  dynamic:  fair                 Objective measurements completed on examination: See above findings.      [facility] Adult PT Treatment/Exercise - 10/09/17 0001      Exercises   Exercises  Knee/Hip      Knee/Hip Exercises: Stretches   Active Hamstring Stretch  3 reps;30 seconds;Left      Knee/Hip Exercises: Seated   Long Arc Quad  10 reps;Both      Knee/Hip Exercises: Supine   Quad Sets  10 reps;Both    Heel Slides  10 reps;Right    Straight Leg Raises  10 reps;Right    Other Supine Knee/Hip Exercises  Clam x20 with GTB             PT Education - 10/09/17 1306    Education Details  PT POC, HEP , Exercise recommendations for OA.    Person(s) Educated  Patient    Methods  Explanation;Handout    Comprehension  Verbalized understanding          PT Long Term Goals - 10/09/17 1312      PT LONG TERM GOAL #1   Title  Pt be independent with long term HEP for ROM and strength of Knees.     Time  4    Period  Weeks    Status  New             Plan - 10/09/17 1307    Clinical Impression Statement  Pt presents with primary complaint of increased pain  in R knee, consistent with OA. He has mild lack of ROM for extension, and has mild strength deficits. He has mild gait deficits, and decreased balance with standing activity. He has lack of effective HEP for dx. Pt with decreased endurance for sustaining activity, due to pain in knee. Pt to benefit from skilled PT to improve strength, and teach HEP. Recommended that pt return for additional 1-2 visits, but Pt requests to have 1 visit to learn HEP only. He will return for 1 additional visit if he has questions or problems. If pt does not return, will d/c. HEP for ROM and strength reviewed in detail with pt today, along with recommendations for exercise with OA. Pt in agreement with plan.     Clinical Presentation  Stable    Clinical Decision Making  Low    Rehab Potential  Fair    PT Frequency  1x / week    PT Duration  4 weeks    PT Treatment/Interventions  ADLs/Self Care Home Management;Cryotherapy;Electrical Stimulation;Moist Heat;Therapeutic activities;Functional mobility training;Stair training;Gait training;Ultrasound;Therapeutic exercise;Balance training;Neuromuscular re-education;Patient/family education;Passive range of motion;Taping;Vasopneumatic Device    PT Next Visit Plan  Review HEp as needed.    Consulted and Agree with Plan of Care  Patient       Patient will benefit from skilled therapeutic intervention in order to improve the following deficits and impairments:  Abnormal gait, Decreased endurance, Decreased activity tolerance, Decreased strength, Pain, Decreased mobility, Decreased balance, Decreased range of motion  Visit Diagnosis: Chronic pain of right knee  Muscle weakness (generalized)     Problem List Patient Active Problem List   Diagnosis Date Noted  . Primary osteoarthritis of right knee 06/25/2017  . Essential tremor 06/25/2016  . Atrial fibrillation ([place]) 03/26/2016  . GERD (gastroesophageal reflux disease) 02/08/2015  . Nocturia 02/08/2015  . Preventative  health care 05/04/2010  . GEN OSTEOARTHROSIS INVOLVING MULTIPLE SITES 01/03/2010  . ONYCHOMYCOSIS 06/06/2009  .  HOARSENESS, CHRONIC 10/18/2008  . 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 ALLERGIC RHINITIS DUE TO POLLEN 06/07/2008  . NEOPLASM, MALIGNANT, PROSTATE, HX OF, S/P TURP 05/28/2007  . Hypothyroidism 11/27/2006  . DIVERTICULOSIS, COLON 11/27/2006  . Osteoarthritis 11/27/2006  . Hyperlipidemia 10/28/2006  . Essential hypertension 10/28/2006  . History of colonic polyps 10/28/2006    Lyndee Hensen, PT, DPT 1:17 PM  10/09/17    [place], [place], [address] Phone: [phone]   Fax:  [phone]  Name: Daniel Everett MRN: [id number] Date of Birth: Mar 18, 1932

## 2017-10-09 NOTE — Patient Instructions (Signed)
Access Code: [email or web address]  URL: [email or web address]  Date: 10/09/2017  Prepared by: Lyndee Hensen   Exercises  Supine Heel Slides - 10 reps - 2 sets - 2x daily  Supine Quad Set - 10 reps - 2 sets - 2x daily  Seated Knee Extension AROM - 10 reps - 2 sets - 2x daily  Straight Leg Raise - 10 reps - 2 sets - 2x daily  Hooklying Isometric Clamshell - 10 reps - 2 sets - 2x daily  Seated Hip Abduction with Resistance - 10 reps - 2 sets - 2x daily  Seated Hamstring Stretch - 3 reps - 30 hold - 2x daily

## 2017-10-13 ENCOUNTER — Ambulatory Visit: Payer: Medicare HMO | Admitting: Adult Health

## 2017-10-13 ENCOUNTER — Encounter: Payer: Self-pay | Admitting: Adult Health

## 2017-10-13 ENCOUNTER — Other Ambulatory Visit: Payer: Self-pay

## 2017-10-13 VITALS — BP 131/76 | HR 47 | Wt 195.2 lb

## 2017-10-13 DIAGNOSIS — G250 Essential tremor: Secondary | ICD-10-CM

## 2017-10-13 NOTE — Patient Instructions (Signed)
Your Plan:  Continue to monitor tremor If your symptoms worsen or you develop new symptoms please let us know.   Thank you for coming to see us at [facility]. I hope we have been able to provide you high quality care today.  You may receive a patient satisfaction survey over the next few weeks. We would appreciate your feedback and comments so that we may continue to improve ourselves and the health of our patients.  

## 2017-10-13 NOTE — Progress Notes (Signed)
PATIENT: Daniel Everett DOB: February 15, 1932  REASON FOR VISIT: follow up HISTORY FROM: patient  HISTORY OF PRESENT ILLNESS: Today 10/13/17:  Mr. Daniel Everett is an 84 year old male with a history of essential tremor.  He returns today for follow-up.  He feels that his tremor has remained stable.  He notices it primarily if he does any writing.  He states that it does not affect him eating but he does notice it at times.  He is able to dress himself without difficulty.  He denies any changes with his gait or balance.  Denies any other symptoms.  He returns today for evaluation.  HISTORY Mr. Daniel Everett is an 84 year old right-handed white male with a history of an essential tremor.  The patient noted the tremor onset about 2 or 3 years ago, this has gradually worsened over time.  The patient indicates that he has some difficulty with handwriting and some problems with feeding himself.  He has difficulty with performing tasks that require fine motor control.  He denies any tremor involving the head or neck, he denies any vocal tremor.  He does report some mild gait instability, he has fallen on occasion.  He denies any neck pain or back pain.  He has no numbness of the extremities he does have some sensation of fatigue but no true muscle weakness.  He does have some difficulty with bladder control with urgency, he is seeing a urologist for this.  His sister also has a tremor, she has seizures as well related to encephalomalacia of the brain.  The patient is concerned that he may also have some problem with the brain causing the tremor.  He is sent to this office for further evaluation.  The patient denies that he has had to give up doing anything because of the tremor.   REVIEW OF SYSTEMS: Out of a complete 14 system review of symptoms, the patient complains only of the following symptoms, and all other reviewed systems are negative.  See HPI  ALLERGIES: No Known Allergies  HOME MEDICATIONS: Outpatient  Medications Prior to Visit  Medication Sig Dispense Refill  . acetaminophen (TYLENOL) 500 MG tablet Take 650 mg by mouth 2 (two) times daily. Take one tablet twice a day.     Marland Kitchen amLODipine (NORVASC) 5 MG tablet Take 1 tablet (5 mg total) by mouth daily. 90 tablet 3  . apixaban (ELIQUIS) 5 MG TABS tablet Take 1 tablet (5 mg total) by mouth 2 (two) times daily. 180 tablet 3  . atorvastatin (LIPITOR) 20 MG tablet Take 1 tablet (20 mg total) by mouth daily. 90 tablet 3  . finasteride (PROSCAR) 5 MG tablet Take 1 tablet (5 mg total) by mouth daily. 90 tablet 2  . hydrochlorothiazide (MICROZIDE) 12.5 MG capsule Take 1 capsule (12.5 mg total) by mouth daily. 90 capsule 3  . latanoprost (XALATAN) 0.005 % ophthalmic solution Place 1 drop into both eyes at bedtime.     Marland Kitchen levothyroxine (SYNTHROID, LEVOTHROID) 75 MCG tablet Take 1 tablet (75 mcg total) by mouth daily. 90 tablet 2  . lisinopril (PRINIVIL,ZESTRIL) 20 MG tablet Take 1 tablet (20 mg total) by mouth daily. 90 tablet 3  . Multiple Vitamin (MULTIVITAMIN) tablet Take 1 tablet by mouth daily.      . Pyridoxine HCl (VITAMIN B-6) 500 MG tablet Take 500 mg by mouth daily.    . ranitidine (ZANTAC) 150 MG tablet Take 1 tablet (150 mg total) by mouth 2 (two) times daily. 30 tablet 3  .  timolol (TIMOPTIC) 0.5 % ophthalmic solution Place 1 drop into both eyes 2 (two) times daily.       No facility-administered medications prior to visit.     PAST MEDICAL HISTORY: Past Medical History:  Diagnosis Date  . Allergic rhinitis due to pollen 06/07/2008  . Atrial fibrillation ([place])   . Benign essential HTN   . COLONIC POLYPS, HX OF 10/28/2006  . DIVERTICULOSIS, COLON 11/27/2006  . GEN OSTEOARTHROSIS INVOLVING MULTIPLE SITES 01/03/2010  . HYPERGLYCEMIA 10/18/2008  . HYPOTHYROIDISM 11/27/2006  . NEOPLASM, MALIGNANT, PROSTATE, HX OF, S/P TURP 05/28/2007  . OSTEOARTHRITIS 11/27/2006  . WEIGHT LOSS, RECENT 11/27/2006    PAST SURGICAL HISTORY: Past Surgical History:    Procedure Laterality Date  . arthroscopy rt knee    . HYDROCELE EXCISION / REPAIR    . PROSTATE SURGERY     TURP  . ROTATOR CUFF REPAIR      FAMILY HISTORY: Family History  Problem Relation Age of Onset  . Coronary artery disease Mother   . Heart disease Mother   . Pancreatic cancer Father   . Cancer Father   . Tremor Sister     SOCIAL HISTORY: Social History   Socioeconomic History  . Marital status: Married    Spouse name: Not on file  . Number of children: Not on file  . Years of education: Not on file  . Highest education level: Not on file  Occupational History  . Occupation: retired  Scientific laboratory technician  . Financial resource strain: Not on file  . Food insecurity:    Worry: Not on file    Inability: Not on file  . Transportation needs:    Medical: Not on file    Non-medical: Not on file  Tobacco Use  . Smoking status: Never Smoker  . Smokeless tobacco: Never Used  Substance and Sexual Activity  . Alcohol use: No  . Drug use: No  . Sexual activity: Yes  Lifestyle  . Physical activity:    Days per week: Not on file    Minutes per session: Not on file  . Stress: Not on file  Relationships  . Social connections:    Talks on phone: Not on file    Gets together: Not on file    Attends religious service: Not on file    Active member of club or organization: Not on file    Attends meetings of clubs or organizations: Not on file    Relationship status: Not on file  . Intimate partner violence:    Fear of current or ex partner: Not on file    Emotionally abused: Not on file    Physically abused: Not on file    Forced sexual activity: Not on file  Other Topics Concern  . Not on file  Social History Narrative   Lives alone   Caffeine use: very little   Right handed       Spouse is in a nursing home with dementia   She was in 24/7 care at home for 3.5 year   Also has private attendant    She can't talk   3 children    2 in [place] and one in near [place] still goes to church; 'used to play sports   Basketball through college; [employer]; now [place] in [place]:   10/13/17 0751  BP: 131/76  Pulse: (!) 47  Weight: 195 lb 3.2 oz (88.5 kg)   Body mass index is 23.76 kg/m.  Generalized: Well developed, in no acute distress   Neurological examination  Mentation: Alert oriented to time, place, history taking. Follows all commands speech and language fluent Cranial nerve II-XII: Pupils were equal round reactive to light. Extraocular movements were full, visual field were full on confrontational test. Facial sensation and strength were normal. Uvula tongue midline. Head turning and shoulder shrug  were normal and symmetric. Motor: The motor testing reveals 5 over 5 strength of all 4 extremities. Good symmetric motor tone is noted throughout.  Sensory: Sensory testing is intact to soft touch on all 4 extremities. No evidence of extinction is noted.  Coordination: Cerebellar testing reveals good finger-nose-finger and heel-to-shin bilaterally.  Mild tremor noted in the upper extremities. Gait and station: Gait is normal. Reflexes: Deep tendon reflexes are symmetric and normal bilaterally.   DIAGNOSTIC DATA (LABS, IMAGING, TESTING) - I reviewed patient records, labs, notes, testing and imaging myself where available.  Lab Results  Component Value Date   WBC 4.3 09/26/2017   HGB 15.2 09/26/2017   HCT 43.6 09/26/2017   MCV 95.3 09/26/2017   PLT 183.0 09/26/2017      Component Value Date/Time   NA 136 09/26/2017 0833   NA 139 04/04/2016 1413   K 4.3 09/26/2017 0833   CL 99 09/26/2017 0833   CO2 30 09/26/2017 0833   GLUCOSE 113 (H) 09/26/2017 0833   BUN 11 09/26/2017 0833   BUN 16 04/04/2016 1413   CREATININE 0.88 09/26/2017 0833   CALCIUM 9.0 09/26/2017 0833   PROT 6.8 12/25/2016 0827   ALBUMIN 4.1 12/25/2016 0827   AST 24 12/25/2016 0827   ALT 19 12/25/2016 0827   ALKPHOS  47 12/25/2016 0827   BILITOT 1.2 12/25/2016 0827   GFRNONAA 72 04/04/2016 1413   GFRAA 84 04/04/2016 1413   Lab Results  Component Value Date   CHOL 134 12/25/2016   HDL 44.90 12/25/2016   LDLCALC 78 12/25/2016   TRIG 52.0 12/25/2016   CHOLHDL 3 12/25/2016   Lab Results  Component Value Date   HGBA1C 6.0 10/18/2008   Lab Results  Component Value Date   VITAMINB12 525 12/02/2007   Lab Results  Component Value Date   TSH 2.17 09/26/2017      ASSESSMENT AND PLAN 84 y.o. year old male  has a past medical history of Allergic rhinitis due to pollen (06/07/2008), Atrial fibrillation ([place]), Benign essential HTN, COLONIC POLYPS, HX OF (10/28/2006), DIVERTICULOSIS, COLON (11/27/2006), GEN OSTEOARTHROSIS INVOLVING MULTIPLE SITES (01/03/2010), HYPERGLYCEMIA (10/18/2008), HYPOTHYROIDISM (11/27/2006), NEOPLASM, MALIGNANT, PROSTATE, HX OF, S/P TURP (05/28/2007), OSTEOARTHRITIS (11/27/2006), and WEIGHT LOSS, RECENT (11/27/2006). here with :  1.  Essential tremor  Overall the patient has remained stable.  He does likely has an essential tremor.  At this time the tremor is mild.  I do not feel that he needs to be treated with medication.  However if his tremor worsens and he does again to affect his ADLs he should let us know.  He will follow-up in 6-8 months or sooner if needed.   I spent 15 minutes with the patient. 50% of this time was spent reviewing his medication and diagnosis   Ward Givens, MSN, NP-C 10/13/2017, 8:13 AM [facility] [address] [place], [place] [address] [phone]

## 2017-10-13 NOTE — Progress Notes (Signed)
I have read the note, and I agree with the clinical assessment and plan.  Charles K Willis   

## 2017-11-10 DIAGNOSIS — H401123 Primary open-angle glaucoma, left eye, severe stage: Secondary | ICD-10-CM | POA: Diagnosis not present

## 2017-11-10 DIAGNOSIS — H401111 Primary open-angle glaucoma, right eye, mild stage: Secondary | ICD-10-CM | POA: Diagnosis not present

## 2017-11-27 ENCOUNTER — Ambulatory Visit (INDEPENDENT_AMBULATORY_CARE_PROVIDER_SITE_OTHER): Payer: Medicare HMO | Admitting: Sports Medicine

## 2017-11-27 ENCOUNTER — Encounter: Payer: Self-pay | Admitting: Sports Medicine

## 2017-11-27 VITALS — BP 124/82 | HR 41 | Ht 76.0 in | Wt 197.8 lb

## 2017-11-27 DIAGNOSIS — G8929 Other chronic pain: Secondary | ICD-10-CM

## 2017-11-27 DIAGNOSIS — M6281 Muscle weakness (generalized): Secondary | ICD-10-CM | POA: Diagnosis not present

## 2017-11-27 DIAGNOSIS — M1711 Unilateral primary osteoarthritis, right knee: Secondary | ICD-10-CM

## 2017-11-27 DIAGNOSIS — M25561 Pain in right knee: Secondary | ICD-10-CM

## 2017-11-27 NOTE — Progress Notes (Signed)
Daniel Everett. Daniel Everett, [place] at [facility] [phone]  Daniel Everett - 84 y.o. male MRN [id number]  Date of birth: April 25, 1931  Visit Date: 11/27/2017  PCP: Daniel Post, MD   Referred by: Daniel Post, MD  Scribe(s) for today's visit: Daniel Everett, CMA  SUBJECTIVE:  Daniel Everett is here for Follow-up (R knee pain)   07/03/2017: His R knee pain symptoms INITIALLY: Began several years ago and he had XR of the R knee in 2017 which showed degenerative changes.  Described as moderate pain and stiffness, nonradiating Worsened with going up stairs.  Improved with rest Additional associated symptoms include: He reports occasional swelling around the knee but not generally. Pain is medial. He has noticed occasional clicking, popping, and catching. He has noticed some weakness in the R leg. At times he feels the knee may give out on him.    At this time symptoms show no change compared to onset. He has been taking Tylenol with minimal relief. He is currently on Eliquis and unable to take NSAIDs.   10/02/2017: Compared to the last office visit, his previously described symptoms are improving, pain has improved but he continues to have weakness in the R knee. He feels like there is more swelling and stiffness around the knee which is affecting his ROM. He feels like over all there hasn't been much change with his knee.  Current symptoms are mild & are nonradiating He has been taking Tylenol with some relief. He received steroid injection 07/03/2017 and tolerated well.  He notes that he was feeling unsteady on his feet so his urologist took him off of Flomax. He says that it hasn't been long enough to tell if its helping.   11/27/2017: Compared to the last office visit, his previously described symptoms are improving. Current symptoms are mild & are nonradiating. He reports minimal swelling around the knee.  He has been taking Tylenol  for arthritis and this helps slightly with knee pain. He has been to PT. He has been doing HEP with no trouble.   He recently joined a health club and plans to "do more of that".    REVIEW OF SYSTEMS: Denies night time disturbances. Denies fevers, chills, or night sweats. Denies unexplained weight loss. Reports personal history of prostate cancer. Denies changes in bowel or bladder habits. Denies recent unreported falls. Denies new or worsening dyspnea or wheezing. Denies headaches or dizziness.  Denies numbness, tingling or weakness  In the extremities.  Denies dizziness or presyncopal episodes Reports lower extremity edema     HISTORY & PERTINENT PRIOR DATA:  Significant/pertinent history, findings, studies include:  reports that he has never smoked. He has never used smokeless tobacco. No results for input(s): HGBA1C, LABURIC, CREATINE in the last 8760 hours. 08/05/17 - [facility] - 20% coinsurance with a $25.00 copay. No problems updated.  Otherwise prior history reviewed and updated per electronic medical record.    OBJECTIVE:  VS:  HT:6\' 4"  (193 cm)   WT:197 lb 12.8 oz (89.7 kg)  BMI:24.09    BP:124/82  HR:(Abnormal) 41bpm  TEMP: ( )  RESP:94 %   PHYSICAL EXAM: CONSTITUTIONAL: Well-developed, Well-nourished and In no acute distress Alert & appropriately interactive. and Not depressed or anxious appearing. RESPIRATORY: No increased work of breathing and Trachea Midline EYES: Pupils are equal., EOM intact without nystagmus. and No scleral icterus.  Lower extremities: Warm and well perfused NEURO: unremarkable  MSK Exam: Knee  is overall well aligned with OA bossing. Small effusion Mild synovitis ROM 3-115degrees Stable to Varus/valgus strain   PROCEDURES & DATA REVIEWED:  . None  ASSESSMENT   1. Chronic pain of right knee   2. Muscle weakness (generalized)   3. Primary osteoarthritis of right knee     PLAN:   Continue your home exercise program      . Overall doing well and having only minimal pain wit ADLs. Has recently joined [employer] and is starting to increase his exercise tolerance. . Will get Monovisc and [facility] Prior-auths compelted No problem-specific Assessment & Plan notes found for this encounter.   Follow-up: Return if symptoms worsen or fail to improve.      Please see additional documentation for Objective, Assessment and Plan sections. Pertinent additional documentation may be included in corresponding procedure notes, imaging studies, problem based documentation and patient instructions. Please see these sections of the encounter for additional information regarding this visit.  CMA/ATC served as Education administrator during this visit. History, Physical, and Plan performed by medical provider. Documentation and orders reviewed and attested to.      Gerda Diss, [place] Sports Medicine Physician

## 2018-01-08 ENCOUNTER — Ambulatory Visit (INDEPENDENT_AMBULATORY_CARE_PROVIDER_SITE_OTHER): Payer: Medicare HMO | Admitting: *Deleted

## 2018-01-08 DIAGNOSIS — Z23 Encounter for immunization: Secondary | ICD-10-CM | POA: Diagnosis not present

## 2018-01-23 ENCOUNTER — Other Ambulatory Visit: Payer: Self-pay

## 2018-01-23 ENCOUNTER — Ambulatory Visit (INDEPENDENT_AMBULATORY_CARE_PROVIDER_SITE_OTHER): Payer: Medicare HMO | Admitting: Family Medicine

## 2018-01-23 ENCOUNTER — Encounter: Payer: Self-pay | Admitting: Family Medicine

## 2018-01-23 VITALS — BP 114/64 | HR 43 | Temp 98.0°F | Ht 76.0 in | Wt 196.7 lb

## 2018-01-23 DIAGNOSIS — G250 Essential tremor: Secondary | ICD-10-CM | POA: Diagnosis not present

## 2018-01-23 DIAGNOSIS — Z9181 History of falling: Secondary | ICD-10-CM | POA: Diagnosis not present

## 2018-01-23 DIAGNOSIS — M1711 Unilateral primary osteoarthritis, right knee: Secondary | ICD-10-CM

## 2018-01-23 NOTE — Patient Instructions (Signed)
Fall Prevention in the Home Falls can cause injuries and can affect people from all age groups. There are many simple things that you can do to make your home safe and to help prevent falls. What can I do on the outside of my home?  Regularly repair the edges of walkways and driveways and fix any cracks.  Remove high doorway thresholds.  Trim any shrubbery on the main path into your home.  Use bright outdoor lighting.  Clear walkways of debris and clutter, including tools and rocks.  Regularly check that handrails are securely fastened and in good repair. Both sides of any steps should have handrails.  Install guardrails along the edges of any raised decks or porches.  Have leaves, snow, and ice cleared regularly.  Use sand or salt on walkways during winter months.  In the garage, clean up any spills right away, including grease or oil spills. What can I do in the bathroom?  Use night lights.  Install grab bars by the toilet and in the tub and shower. Do not use towel bars as grab bars.  Use non-skid mats or decals on the floor of the tub or shower.  If you need to sit down while you are in the shower, use a plastic, non-slip stool.  Keep the floor dry. Immediately clean up any water that spills on the floor.  Remove soap buildup in the tub or shower on a regular basis.  Attach bath mats securely with double-sided non-slip rug tape.  Remove throw rugs and other tripping hazards from the floor. What can I do in the bedroom?  Use night lights.  Make sure that a bedside light is easy to reach.  Do not use oversized bedding that drapes onto the floor.  Have a firm chair that has side arms to use for getting dressed.  Remove throw rugs and other tripping hazards from the floor. What can I do in the kitchen?  Clean up any spills right away.  Avoid walking on wet floors.  Place frequently used items in easy-to-reach places.  If you need to reach for something above  you, use a sturdy step stool that has a grab bar.  Keep electrical cables out of the way.  Do not use floor polish or wax that makes floors slippery. If you have to use wax, make sure that it is non-skid floor wax.  Remove throw rugs and other tripping hazards from the floor. What can I do in the stairways?  Do not leave any items on the stairs.  Make sure that there are handrails on both sides of the stairs. Fix handrails that are broken or loose. Make sure that handrails are as long as the stairways.  Check any carpeting to make sure that it is firmly attached to the stairs. Fix any carpet that is loose or worn.  Avoid having throw rugs at the top or bottom of stairways, or secure the rugs with carpet tape to prevent them from moving.  Make sure that you have a light switch at the top of the stairs and the bottom of the stairs. If you do not have them, have them installed. What are some other fall prevention tips?  Wear closed-toe shoes that fit well and support your feet. Wear shoes that have rubber soles or low heels.  When you use a stepladder, make sure that it is completely opened and that the sides are firmly locked. Have someone hold the ladder while you are using   it. Do not climb a closed stepladder.  Add color or contrast paint or tape to grab bars and handrails in your home. Place contrasting color strips on the first and last steps.  Use mobility aids as needed, such as canes, walkers, scooters, and crutches.  Turn on lights if it is dark. Replace any light bulbs that burn out.  Set up furniture so that there are clear paths. Keep the furniture in the same spot.  Fix any uneven floor surfaces.  Choose a carpet design that does not hide the edge of steps of a stairway.  Be aware of any and all pets.  Review your medicines with your healthcare provider. Some medicines can cause dizziness or changes in blood pressure, which increase your risk of falling. Talk with  your health care provider about other ways that you can decrease your risk of falls. This may include working with a physical therapist or trainer to improve your strength, balance, and endurance. This information is not intended to replace advice given to you by your health care provider. Make sure you discuss any questions you have with your health care provider. Document Released: 03/01/2002 Document Revised: 08/08/2015 Document Reviewed: 04/15/2014 Elsevier Interactive Patient Education  2018 Elsevier Inc.  

## 2018-01-23 NOTE — Progress Notes (Signed)
Subjective:     Patient ID: Daniel Everett, male   DOB: [birth year], 84 y.o.   MRN: [id number]  HPI Patient is seen with chief complaint that he is "losing his balance "more than normal recently.  He had one episode particularly where he got up at night to go the bathroom.  There was no syncope.  No dizziness.  No orthostatic symptoms.  Denied any vertigo.  He states his gait seem to be somewhat unstable.  He has had a few milder episodes since then.  No history of any known neuropathy.  No focal weakness,  No recent ataxia, speech changes, dysphagia.    Does have essential tremor which mostly affects the hands.  Currently not on any medication for that.  He does not have any significant head or neck tremor.  This is mostly affecting his writing.  He has seen neurology in the past and may be scheduling follow-up there soon.  Chronic problems include history of hypertension, atrial fibrillation, diverticulosis, hypothyroidism, osteoarthritis.  He remains on Eliquis.  He remains quite active.  His wife is in a nursing home and he visits her nightly.  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  He has a Higher education careers adviser at [employer] but has not been going there regularly.  Past Medical History:  Diagnosis Date  . Allergic rhinitis due to pollen 06/07/2008  . Atrial fibrillation ([place])   . Benign essential HTN   . COLONIC POLYPS, HX OF 10/28/2006  . DIVERTICULOSIS, COLON 11/27/2006  . GEN OSTEOARTHROSIS INVOLVING MULTIPLE SITES 01/03/2010  . HYPERGLYCEMIA 10/18/2008  . HYPOTHYROIDISM 11/27/2006  . NEOPLASM, MALIGNANT, PROSTATE, HX OF, S/P TURP 05/28/2007  . OSTEOARTHRITIS 11/27/2006  . WEIGHT LOSS, RECENT 11/27/2006   Past Surgical History:  Procedure Laterality Date  . arthroscopy rt knee    . HYDROCELE EXCISION / REPAIR    . PROSTATE SURGERY     TURP  . ROTATOR CUFF REPAIR      reports that he has never smoked. He has never used smokeless tobacco. He reports that he does not drink  alcohol or use drugs. family history includes Cancer in his father; Coronary artery disease in his mother; Heart disease in his mother; Pancreatic cancer in his father; Tremor in his sister. No Known Allergies   Review of Systems  Constitutional: Negative for fatigue and unexpected weight change.  Eyes: Negative for visual disturbance.  Respiratory: Negative for cough, chest tightness and shortness of breath.   Cardiovascular: Negative for chest pain, palpitations and leg swelling.  Endocrine: Negative for polydipsia and polyuria.  Genitourinary: Negative for dysuria.  Neurological: Positive for tremors. Negative for dizziness, seizures, syncope, speech difficulty, weakness, light-headedness and headaches.       Objective:   Physical Exam  Constitutional: He is oriented to person, place, and time. He appears well-developed and well-nourished.  Cardiovascular: Normal rate.  Pulmonary/Chest: Effort normal and breath sounds normal.  Musculoskeletal: He exhibits no edema.  Neurological: He is alert and oriented to person, place, and time. No cranial nerve deficit.  He has intention tremor upper extremities which is worse with movement.  No significant rigidity. Full strength throughout.  Romberg normal.       Assessment:     #1 complaints of feeling "off balance ".  He does not describe any orthostatic type symptoms.  No neuropathy.  Question if some of this is related to his tremor though he seems to have limited head and neck component  #2  essential tremor    Plan:     -Discussed fall prevention in some detail -We recommend setting up physical therapy for fall prevention evaluation and exercises -He will follow-up with his neurologist regarding essential tremor.  He is not on a good candidate for beta-blocker therapy with baseline low pulse  [name] MD [facility] at [facility]

## 2018-02-05 ENCOUNTER — Telehealth: Payer: Self-pay | Admitting: Neurology

## 2018-02-05 NOTE — Telephone Encounter (Signed)
Patient called in wanting to make an appointment with Dr. Jannifer Franklin due to his tremor in his hands have gotten worse. I couldn't find anything before April for Dr. Jannifer Franklin he is scheduled with [facility] for April but she will not be in the office at that time. He asked for a call on his cell phone at [phone] because his home phone is out of order.

## 2018-02-05 NOTE — Telephone Encounter (Signed)
Spoke with pt. He sts. tremors in hands have gotten worse and he would like to discuss with Dr. Jannifer Franklin. Also, he would like to discuss increasing difficulty with balance.  I offered appts on 11/27/ and 12/5, but he declined, citing scheduling conflicts.  Appt. given 03/02/18, arrival time 7am for a 0730 appt/fim

## 2018-02-10 ENCOUNTER — Ambulatory Visit: Payer: Medicare HMO | Attending: Family Medicine | Admitting: Physical Therapy

## 2018-02-10 ENCOUNTER — Other Ambulatory Visit: Payer: Self-pay

## 2018-02-10 ENCOUNTER — Encounter: Payer: Self-pay | Admitting: Physical Therapy

## 2018-02-10 DIAGNOSIS — R2681 Unsteadiness on feet: Secondary | ICD-10-CM | POA: Diagnosis not present

## 2018-02-10 DIAGNOSIS — M6281 Muscle weakness (generalized): Secondary | ICD-10-CM | POA: Diagnosis not present

## 2018-02-10 NOTE — Therapy (Signed)
[facility] [address] [place], [place], [address] Phone: [phone]   Fax:  [phone]  Physical Therapy Evaluation  Patient Details  Name: Daniel Everett MRN: [id number] Date of Birth: [birth year] Referring Provider (PT): Carolann Littler, MD   Encounter Date: 82/19/2019  PT End of Session - 02/10/18 0908    Visit Number  1    Number of Visits  2    Date for PT Re-Evaluation  04/13/18    Authorization Type  [employer] Medicare     Authorization Time Period  02/10/18 to 04/13/18    PT Start Time  0815    PT Stop Time  0907    PT Time Calculation (min)  52 min    Activity Tolerance  Patient tolerated treatment well;No increased pain    Behavior During Therapy  WFL for tasks assessed/performed       Past Medical History:  Diagnosis Date  . Allergic rhinitis due to pollen 06/07/2008  . Atrial fibrillation ([place])   . Benign essential HTN   . COLONIC POLYPS, HX OF 10/28/2006  . DIVERTICULOSIS, COLON 11/27/2006  . GEN OSTEOARTHROSIS INVOLVING MULTIPLE SITES 01/03/2010  . HYPERGLYCEMIA 10/18/2008  . HYPOTHYROIDISM 11/27/2006  . NEOPLASM, MALIGNANT, PROSTATE, HX OF, S/P TURP 05/28/2007  . OSTEOARTHRITIS 11/27/2006  . WEIGHT LOSS, RECENT 11/27/2006    Past Surgical History:  Procedure Laterality Date  . arthroscopy rt knee    . HYDROCELE EXCISION / REPAIR    . PROSTATE SURGERY     TURP  . ROTATOR CUFF REPAIR      There were no vitals filed for this visit.   Subjective Assessment - 02/10/18 0820    Subjective  Pt reports that he has been having issues with his balance and steadiness. He hasn't noticed anything in particular that caused it, but he thinks in the past 3-4 weeks it has been worse. He has not had any falls, but did [place] the beach [place] sand. He denies dizziness.     Limitations  Standing;Writing;House hold activities;Lifting    Currently in Pain?  No/denies    Pain Onset   More than a month ago         [facility] PT Assessment - 02/10/18 0001      Assessment   Medical Diagnosis  High risk of falls    Referring Provider (PT)  Carolann Littler, MD    Onset Date/Surgical Date  --   3-4 weeks ago    Prior Therapy  none for this       Precautions   Precautions  None      Balance Screen   Has the patient fallen in the past 6 months  No    Has the patient had a decrease in activity level because of a fear of falling?   No    Is the patient reluctant to leave their home because of a fear of falling?   No      Prior Function   Level of Independence  Independent      Cognition   Overall Cognitive Status  Within Functional Limits for tasks assessed      ROM / Strength   AROM / PROM / Strength  Strength      Strength   Strength Assessment Site  Hip;Knee;Ankle    Right/Left Hip  Right;Left    Right Hip Flexion  5/5    Right Hip Extension  4/5  Right Hip ABduction  4/5    Left Hip Flexion  5/5    Left Hip Extension  4/5    Left Hip ABduction  4/5    Right/Left Knee  Right;Left    Right Knee Flexion  5/5    Right Knee Extension  5/5    Left Knee Flexion  5/5    Left Knee Extension  5/5    Right/Left Ankle  Right;Left    Right Ankle Dorsiflexion  5/5    Right Ankle Plantar Flexion  --   single leg heel raise x20 reps with UE assistance   Left Ankle Dorsiflexion  5/5    Left Ankle Plantar Flexion  --   single leg heel raise x20 reps with UE assistance     Transfers   Five time sit to stand comments   13 sec, UE on thighs      Standardized Balance Assessment   Standardized Balance Assessment  Timed Up and Go Test;Berg Balance Test      Berg Balance Test   Sit to Stand  Able to stand  independently using hands    Standing Unsupported  Able to stand safely 2 minutes    Sitting with Back Unsupported but Feet Supported on Floor or Stool  Able to sit safely and securely 2 minutes    Stand to Sit  Sits safely with minimal use of hands    Transfers   Able to transfer safely, minor use of hands    Standing Unsupported with Eyes Closed  Able to stand 10 seconds safely    Standing Ubsupported with Feet Together  Able to place feet together independently and stand 1 minute safely    From Standing, Reach Forward with Outstretched Arm  Can reach confidently >25 cm (10")    From Standing Position, Pick up Object from Floor  Able to pick up shoe safely and easily    From Standing Position, Turn to Look Behind Over each Shoulder  Looks behind from both sides and weight shifts well    Turn 360 Degrees  Able to turn 360 degrees safely in 4 seconds or less    Standing Unsupported, Alternately Place Feet on Step/Stool  Able to stand independently and safely and complete 8 steps in 20 seconds    Standing Unsupported, One Foot in Front  Able to place foot tandem independently and hold 30 seconds    Standing on One Leg  Tries to lift leg/unable to hold 3 seconds but remains standing independently   Rt 2 sec, Lt 6 sec    Total Score  52      Timed Up and Go Test   TUG Comments  9 sec                 Objective measurements completed on examination: See above findings.      [place] [facility] PT Treatment/Exercise - 02/10/18 0001      Exercises   Exercises  Knee/Hip      Knee/Hip Exercises: Stretches   Hip Flexor Stretch  Both;1 rep;20 seconds    Hip Flexor Stretch Limitations  standing              PT Education - 02/10/18 0920    Education Details  eval findings/POC; implemented HEP    Person(s) Educated  Patient    Methods  Explanation;Verbal cues;Handout;Demonstration    Comprehension  Verbalized understanding;Returned demonstration          PT Long Term Goals -  02/10/18 0916      PT LONG TERM GOAL #1   Title  Pt will demo understanding and independence with his advanced HEP to improve flexibility, proprioception and decrease risk of falls.     Time  1    Period  Months    Status  New    Target Date  03/12/18      PT  LONG TERM GOAL #2   Title  Pt will be able to maintain single leg stance for atleast 10 sec on each LE, 2/3 trials, which will improve his stability and safety with stairs.     Time  2    Period  Months    Status  New    Target Date  04/13/18      PT LONG TERM GOAL #3   Title  Pt will demo improved hip flexibility, evident by atleast 5 deg active hip extension in prone testing, which will improve his mechanics with ambulation.     Time  2    Period  Months    Status  New             Plan - 02/10/18 0909    Clinical Impression Statement  Pt is a pleasant 84 y.o M referred to OPPT with complaints of recent onset unsteadiness with his daily activity. He denies any recent change in medication as well as any dizziness. He did perform well on functional testing of TUG and 5x sit to stand within expected ranges for someone his age. He scored 52/56 on his BERG balance test placing him in a low risk of falls category. Pt does demonstrate limitations in hip flexibility and poor single leg stability. He was only able to maintain single leg stance for 2 sec during today's evaluation. Pt recently joined the local gym, although he has not been able to go as much as he hoped. He would benefit from an initial HEP progressed to an advanced HEP to improve his flexibility and proprioception. He feels comfortable with today's provided HEP and plans to return to the gym and will follow up with PT in 1 month to ensure progress is being made towards his goals and he has understanding with self-progression of exercise to decrease risk of future falls and injury.      History and Personal Factors relevant to plan of care:  history of Rt knee pain    Clinical Presentation  Stable    Clinical Decision Making  Low    Rehab Potential  Good    PT Frequency  Monthy    PT Duration  Other (comment)   x2 months   PT Treatment/Interventions  ADLs/Self Care Home Management;Moist Heat;Therapeutic activities;Functional  mobility training;Stair training;Gait training;Therapeutic exercise;Balance training;Neuromuscular re-education;Patient/family education;Passive range of motion;Taping;Vasopneumatic Device;Cryotherapy    PT Next Visit Plan  review HEP; check single leg balance and update HEP to single leg vectors, etc.; f/u on gym program and discuss recommended reps/sets     PT Home Exercise Plan  Access Code: [email or web address]     Consulted and Agree with Plan of Care  Patient       Patient will benefit from skilled therapeutic intervention in order to improve the following deficits and impairments:  Decreased endurance, Decreased activity tolerance, Decreased strength, Decreased mobility, Decreased balance, Decreased range of motion, Impaired flexibility  Visit Diagnosis: Unsteadiness on feet  Muscle weakness (generalized)     Problem List Patient Active Problem List   Diagnosis Date Noted  . Primary osteoarthritis  of right knee 06/25/2017  . Essential tremor 06/25/2016  . Atrial fibrillation ([place]) 03/26/2016  . GERD (gastroesophageal reflux disease) 02/08/2015  . Nocturia 02/08/2015  . Preventative health care 05/04/2010  . GEN OSTEOARTHROSIS INVOLVING MULTIPLE SITES 01/03/2010  . ONYCHOMYCOSIS 06/06/2009  . HOARSENESS, CHRONIC 10/18/2008  . 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 ALLERGIC RHINITIS DUE TO POLLEN 06/07/2008  . NEOPLASM, MALIGNANT, PROSTATE, HX OF, S/P TURP 05/28/2007  . Hypothyroidism 11/27/2006  . DIVERTICULOSIS, COLON 11/27/2006  . Osteoarthritis 11/27/2006  . Hyperlipidemia 10/28/2006  . Essential hypertension 10/28/2006  . History of colonic polyps 10/28/2006     9:22 AM,02/10/18 Sherol Dade PT, DPT [place] [facility] [address], [place], [place], [address] Phone: [phone]   Fax:  [phone]  Name: SHERRILL MCKAMIE MRN: [id number] Date of  Birth: August 27, 1931

## 2018-02-10 NOTE — Patient Instructions (Signed)
Access Code: [email or web address]  URL: [email or web address]  Date: 02/10/2018  Prepared by: Sherol Dade   Exercises  Hip Flexor Stretch on Step - 3 sets - 20 hold - 1x daily - 7x weekly  Tandem Walking with Counter Support - 3 sets - 30 hold - 1x daily - 7x weekly  Single Leg Stance - 3 sets - 15 hold - 1x daily - 7x weekly  Side Stepping with Resistance at Feet - 10 reps - 2 sets - 1x daily - 7x weekly    [facility] [address], [place], [place] [address] Phone # [phone] Fax [phone]

## 2018-02-12 DIAGNOSIS — R69 Illness, unspecified: Secondary | ICD-10-CM | POA: Diagnosis not present

## 2018-02-17 ENCOUNTER — Encounter: Payer: Self-pay | Admitting: Sports Medicine

## 2018-02-17 ENCOUNTER — Ambulatory Visit: Payer: Medicare HMO | Admitting: Sports Medicine

## 2018-02-17 ENCOUNTER — Ambulatory Visit: Payer: Self-pay

## 2018-02-17 VITALS — BP 112/80 | HR 44 | Ht 76.0 in | Wt 201.8 lb

## 2018-02-17 DIAGNOSIS — M1711 Unilateral primary osteoarthritis, right knee: Secondary | ICD-10-CM

## 2018-02-17 NOTE — Assessment & Plan Note (Signed)
Moderate degenerative changes with small supraphysiologic effusion.  Should respond well to Zilretta injection today.

## 2018-02-17 NOTE — Procedures (Signed)
PROCEDURE NOTE:  Ultrasound Guided: Injection: Right knee Images were obtained and interpreted by myself, Teresa Coombs, DO  Images have been saved and stored to PACS system. Images obtained on: GE S7 Ultrasound machine    ULTRASOUND FINDINGS:  Moderate effusion.  Moderate synovitis.  DESCRIPTION OF PROCEDURE:  The patient's clinical condition is marked by substantial pain and/or significant functional disability. Other conservative therapy has not provided relief, is contraindicated, or not appropriate. There is a reasonable likelihood that injection will significantly improve the patient's pain and/or functional impairment.   After discussing the risks, benefits and expected outcomes of the injection and all questions were reviewed and answered, the patient wished to undergo the above named procedure.  Verbal consent was obtained.  The ultrasound was used to identify the target structure and adjacent neurovascular structures. The skin was then prepped in sterile fashion and the target structure was injected under direct visualization using sterile technique as below:  Single injection performed as below: PREP: Alcohol and Ethel Chloride APPROACH:superiolateral, single injection, 21g 2 in. INJECTATE: 5cc Zilretta (32mg /49mL Sustained Release Triamcinolone) ASPIRATE: None DRESSING: Band-Aid  Post procedural instructions including recommending icing and warning signs for infection were reviewed.    This procedure was well tolerated and there were no complications.   IMPRESSION: Succesful Ultrasound Guided: Injection

## 2018-02-17 NOTE — Patient Instructions (Signed)

## 2018-02-17 NOTE — Progress Notes (Signed)
[name] Lizann Edelman, [place] at [facility] [phone]  LOGAN [place]TIMORE - 84 y.o. male MRN [id number]  Date of birth: [birth year]  Visit Date: 02/17/2018  PCP: [name], MD   Referred by: [name], MD  Scribe(s) for today's visit: Josepha Pigg, CMA  SUBJECTIVE:  [name] is here for Follow-up (R knee pain)   HPI: 07/03/2017: His R knee pain symptoms INITIALLY: Began several years ago and he had XR of the R knee in 2017 which showed degenerative changes.  Described as moderate pain and stiffness, nonradiating Worsened with going up stairs.  Improved with rest Additional associated symptoms include: He reports occasional swelling around the knee but not generally. Pain is medial. He has noticed occasional clicking, popping, and catching. He has noticed some weakness in the R leg. At times he feels the knee may give out on him.    At this time symptoms show no change compared to onset. He has been taking Tylenol with minimal relief. He is currently on Eliquis and unable to take NSAIDs.   10/02/2017: Compared to the last office visit, his previously described symptoms are improving, pain has improved but he continues to have weakness in the R knee. He feels like there is more swelling and stiffness around the knee which is affecting his ROM. He feels like over all there hasn't been much change with his knee.  Current symptoms are mild & are nonradiating He has been taking Tylenol with some relief. He received steroid injection 07/03/2017 and tolerated well.  He notes that he was feeling unsteady on his feet so his urologist took him off of Flomax. He says that it hasn't been long enough to tell if its helping.   11/27/2017: Compared to the last office visit, his previously described symptoms are improving. Current symptoms are mild & are nonradiating. He reports minimal swelling around the knee.  He has been taking  Tylenol for arthritis and this helps slightly with knee pain. He has been to PT. He has been doing HEP with no trouble.  He recently joined a health club and plans to "do more of that".   02/17/2018: Compared to the last office visit, his previously described symptoms show no change. Current symptoms are mild-moderate & are nonradiating He has completed PT and has been doing HEP  With no trouble. He does HEP occasionally depending on his schedule. He takes tylenol prn with some relief. He goes to the gym periodically.   REVIEW OF SYSTEMS: Denies night time disturbances. Denies fevers, chills, or night sweats. Denies unexplained weight loss. Reports personal history of prostate cancer. Denies changes in bowel or bladder habits. Denies recent unreported falls. Denies new or worsening dyspnea or wheezing. Denies headaches or dizziness.  Denies numbness, tingling or weakness  In the extremities.  Denies dizziness or presyncopal episodes Reports lower extremity edema    HISTORY:  Prior history reviewed and updated per electronic medical record.  Social History   Occupational History  . Occupation: retired  Tobacco Use  . Smoking status: Never Smoker  . Smokeless tobacco: Never Used  Substance and Sexual Activity  . Alcohol use: No  . Drug use: No  . Sexual activity: Yes   Social History   Social History Narrative   Lives alone   Caffeine use: very little   Right handed       Spouse is in a nursing home with dementia  She was in 24/7 care at home for 3.5 year   Also has private attendant    She can't talk   3 children    2 in [place] and one in near [place] still goes to church; 'used to play sports   Basketball through college; [employer]; now [employer] in 59      [employer]:  No results for input(s): HGBA1C, LABURIC, CREATINE in the last 8760 hours. Problem  Primary Osteoarthritis of Right Knee   .   OBJECTIVE:  VS:   HT:6\' 4"  (193 cm)   WT:201 lb 12.8 oz (91.5 kg)  BMI:24.57    BP:112/80  HR:(!) 44bpm  TEMP: ( )  RESP:    PHYSICAL EXAM: CONSTITUTIONAL: Well-developed, Well-nourished and In no acute distress PSYCHIATRIC: Alert & appropriately interactive. and Not depressed or anxious appearing. RESPIRATORY: No increased work of breathing and Trachea Midline EYES: Pupils are equal., EOM intact without nystagmus. and No scleral icterus.  VASCULAR EXAM: Warm and well perfused NEURO: unremarkable  MSK Exam: Right knee  Well aligned, no significant deformity. No overlying skin changes. TTP over Medial joints lines.  Small amount of pain with palpation of the patellar facets   RANGE OF MOTION & STRENGTH  Full extension with 10 degrees of flexion or restriction on the right compared to the left but otherwise good range of motion.   SPECIALITY TESTING:  Ligamentously stable.  Pain with McMurray's but this is mild.     ASSESSMENT   1. Primary osteoarthritis of right knee     PLAN:  Pertinent additional documentation may be included in corresponding procedure notes, imaging studies, problem based documentation and patient instructions.  Procedures:  . US Guided Injection per procedure note  Medications:  No orders of the defined types were placed in this encounter.  Discussion/Instructions: Primary osteoarthritis of right knee Moderate degenerative changes with small supraphysiologic effusion.  Should respond well to Zilretta injection today.  .   . Discussed red flag symptoms that warrant earlier emergent evaluation and patient voices understanding. . Activity modifications and the importance of avoiding exacerbating activities (limiting pain to no more than a 4 / 10 during or following activity) recommended and discussed.  Follow-up:  . Return in about 13 weeks (around 05/19/2018).  . If any lack of improvement: consider referral to Orthopedics for Total knee arthroplasty. . At  follow up will plan: to consider Visco-supplementation Or repeat Zilretta injections.  Monovisc was approved but will need to be re-approved if performing after December 2019.     CMA/ATC served as Education administrator during this visit. History, Physical, and Plan performed by medical provider. Documentation and orders reviewed and attested to.      Gerda Diss, [place] [facility] Medicine Physician

## 2018-03-01 ENCOUNTER — Other Ambulatory Visit: Payer: Self-pay | Admitting: Family Medicine

## 2018-03-02 ENCOUNTER — Ambulatory Visit: Payer: Medicare HMO | Admitting: Neurology

## 2018-03-02 ENCOUNTER — Encounter: Payer: Self-pay | Admitting: Neurology

## 2018-03-02 ENCOUNTER — Telehealth: Payer: Self-pay | Admitting: *Deleted

## 2018-03-02 VITALS — BP 130/83 | HR 66 | Ht 76.0 in | Wt 195.5 lb

## 2018-03-02 DIAGNOSIS — G250 Essential tremor: Secondary | ICD-10-CM

## 2018-03-02 DIAGNOSIS — E538 Deficiency of other specified B group vitamins: Secondary | ICD-10-CM | POA: Diagnosis not present

## 2018-03-02 DIAGNOSIS — R269 Unspecified abnormalities of gait and mobility: Secondary | ICD-10-CM | POA: Diagnosis not present

## 2018-03-02 HISTORY — DX: Unspecified abnormalities of gait and mobility: R26.9

## 2018-03-02 NOTE — Telephone Encounter (Signed)
He can switch to Pepcid 20 mg daily  Cyntia Staley Martinique MD, [facility]

## 2018-03-02 NOTE — Telephone Encounter (Signed)
Returned call to patient no answer.LMTC. 

## 2018-03-02 NOTE — Progress Notes (Signed)
Reason for visit: Tremor, gait disturbance  Daniel Everett is an 84 y.o. male  History of present illness:  Daniel Everett is an 84 year old right-handed white male with a history of an essential tremor.  The patient comes in with a new problem today, he believes that over the last 6 months he has had some decline in his balance.  On his initial examination in January 2019 it was noted that he had decreased vibration and position sense in both feet.  The patient himself does not note any numbness or burning sensations in the feet, he does note some crawling sensations on the left face or neck.  He denies any weakness, he denies neck pain or low back pain or pain down the arms or legs.  He continues to have the tremor that has gradually worsened over time, he notes more difficulty with handwriting, he may have some tremor with feeding himself.  In the past he has not wanted to go on any medications for the tremor.  The patient comes to the office today for an evaluation.  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  He has not had any falls.  He does not use a cane or a walker.  Past Medical History:  Diagnosis Date  . Allergic rhinitis due to pollen 06/07/2008  . Atrial fibrillation ([place])   . Benign essential HTN   . COLONIC POLYPS, HX OF 10/28/2006  . DIVERTICULOSIS, COLON 11/27/2006  . GEN OSTEOARTHROSIS INVOLVING MULTIPLE SITES 01/03/2010  . HYPERGLYCEMIA 10/18/2008  . HYPOTHYROIDISM 11/27/2006  . NEOPLASM, MALIGNANT, PROSTATE, HX OF, S/P TURP 05/28/2007  . OSTEOARTHRITIS 11/27/2006  . WEIGHT LOSS, RECENT 11/27/2006    Past Surgical History:  Procedure Laterality Date  . arthroscopy rt knee    . HYDROCELE EXCISION / REPAIR    . PROSTATE SURGERY     TURP  . ROTATOR CUFF REPAIR      Family History  Problem Relation Age of Onset  . Coronary artery disease Mother   . Heart disease Mother   . Pancreatic cancer Father   . Cancer Father   . Tremor  Sister     Social history:  reports that he has never smoked. He has never used smokeless tobacco. He reports that he does not drink alcohol or use drugs.   No Known Allergies  Medications:  Prior to Admission medications   Medication Sig Start Date End Date Taking? Authorizing Provider  ACETAMINOPHEN PO Take 650 mg by mouth 2 (two) times daily. Take one tablet twice a day.    Yes [provider]  amLODipine (NORVASC) 5 MG tablet Take 1 tablet (5 mg total) by mouth daily. 05/15/17  Yes Martinique, Peter M, MD  apixaban (ELIQUIS) 5 MG TABS tablet Take 1 tablet (5 mg total) by mouth 2 (two) times daily. 05/15/17  Yes Martinique, Peter M, MD  atorvastatin (LIPITOR) 20 MG tablet Take 1 tablet (20 mg total) by mouth daily. 05/15/17  Yes Martinique, Peter M, MD  finasteride (PROSCAR) 5 MG tablet Take 1 tablet (5 mg total) by mouth daily. 05/19/17  Yes Burchette, Alinda Sierras, MD  hydrochlorothiazide (MICROZIDE) 12.5 MG capsule Take 1 capsule (12.5 mg total) by mouth daily. 05/15/17  Yes Martinique, Peter M, MD  latanoprost (XALATAN) 0.005 % ophthalmic solution Place 1 drop into both eyes at bedtime.    Yes [provider]  levothyroxine (SYNTHROID, LEVOTHROID) 75 MCG tablet Take 1 tablet (  75 mcg total) by mouth daily. 05/19/17  Yes Burchette, Alinda Sierras, MD  lisinopril (PRINIVIL,ZESTRIL) 20 MG tablet Take 1 tablet (20 mg total) by mouth daily. 05/15/17  Yes Martinique, Peter M, MD  Multiple Vitamin (MULTIVITAMIN) tablet Take 1 tablet by mouth daily.     Yes [provider]  Pyridoxine HCl (VITAMIN B-6) 500 MG tablet Take 500 mg by mouth daily.   Yes [provider]  ranitidine (ZANTAC) 150 MG tablet Take 1 tablet (150 mg total) by mouth 2 (two) times daily. 09/08/17  Yes Martinique, Peter M, MD  timolol (TIMOPTIC) 0.5 % ophthalmic solution Place 1 drop into both eyes 2 (two) times daily.     Yes [provider]    ROS:  Out of a complete 14 system review of symptoms, the patient complains  only of the following symptoms, and all other reviewed systems are negative.  Walking difficulty Knee pain  Blood pressure 130/83, pulse 66, height 6\' 4"  (1.93 m), weight 195 lb 8 oz (88.7 kg).  Physical Exam  General: The patient is alert and cooperative at the time of the examination.  Skin: No significant peripheral edema is noted.   Neurologic Exam  Mental status: The patient is alert and oriented x 3 at the time of the examination. The patient has apparent normal recent and remote memory, with an apparently normal attention span and concentration ability.   Cranial nerves: Facial symmetry is present. Speech is normal, no aphasia or dysarthria is noted. Extraocular movements are full. Visual fields are full.  Motor: The patient has good strength in all 4 extremities.  Sensory examination: Soft touch sensation is symmetric on the face, arms, and legs.  Pinprick sensation is symmetric throughout, no definite stocking pattern pinprick sensory deficit is noted in the legs.  The patient has normal position sense and vibration sensation in the hands, vibration sensation is decreased in both feet, position sense is decreased in the right foot greater than the left.  Coordination: The patient has good finger-nose-finger and heel-to-shin bilaterally.  Mild tremors noted with finger-nose-finger, some tremor is translated into handwriting with drawing a spiral.  Gait and station: The patient has a normal gait. Tandem gait is minimally unsteady, the patient is able to perform tandem gait. Romberg is negative. No drift is seen.  Reflexes: Deep tendon reflexes are symmetric, but are slightly depressed.   Assessment/Plan:  1.  Essential tremor  2.  Gait disturbance   The patient has a mild essential tremor.  The syndrome of essential tremor may also include a slowly progressive gait disturbance.  The patient however does appear to have some impairment of sensation in the feet, he could  potentially have a mild peripheral neuropathy.  The patient will be sent for some blood work today, he will be set up for nerve conduction studies on both legs and EMG on one leg.  He will follow-up otherwise in 6 months, he does not currently wish to go on any medication for the tremor.    Jill Alexanders MD 03/02/2018 7:33 AM  [facility] [address] [place], [place] [address]  Phone [phone] Fax [phone]

## 2018-03-02 NOTE — Telephone Encounter (Signed)
Patient came by office  - stated that zantac/generic has been recalled  -- patient wanted to know if he should stop medication or have a replacement?  Patient aware will defer to Dr Martinique.

## 2018-03-03 MED ORDER — FAMOTIDINE 20 MG PO TABS: 20.0000 mg | ORAL_TABLET | Freq: Two times a day (BID) | ORAL | 11 refills | Status: DC

## 2018-03-03 NOTE — Telephone Encounter (Signed)
SPOKE TO PATIENT . AWARE OF THE SWITCH TO PEPCID OR GENERIC MAY PURCHASE OVER THE COUNTER. VERBALIZED UNDERSTANDING

## 2018-03-03 NOTE — Telephone Encounter (Signed)
Follow up    Patient returning call in reference to medication.

## 2018-03-04 ENCOUNTER — Telehealth: Payer: Self-pay

## 2018-03-04 LAB — RPR: RPR: NONREACTIVE

## 2018-03-04 LAB — COPPER, SERUM: COPPER: 76 ug/dL (ref 72–166)

## 2018-03-04 LAB — VITAMIN B12: Vitamin B-12: 458 pg/mL (ref 232–1245)

## 2018-03-04 NOTE — Telephone Encounter (Addendum)
The blood work results are unremarkable. Please call the patient.   Previous Messages    ----- Message -----  From: Interface, [facility] Lab Results In  Sent: 03/03/2018  7:39 AM EST  To: Kathrynn Ducking, MD      Patient notified of lab results via voicemail on number [phone] (H). Ok per [employer]. Patient advised to call back if he had  further questions.  MB RN.

## 2018-03-10 ENCOUNTER — Encounter: Payer: Self-pay | Admitting: Physical Therapy

## 2018-03-10 ENCOUNTER — Ambulatory Visit: Payer: Medicare HMO | Attending: Family Medicine | Admitting: Physical Therapy

## 2018-03-10 DIAGNOSIS — R2681 Unsteadiness on feet: Secondary | ICD-10-CM | POA: Diagnosis not present

## 2018-03-10 DIAGNOSIS — M6281 Muscle weakness (generalized): Secondary | ICD-10-CM | POA: Diagnosis not present

## 2018-03-10 NOTE — Patient Instructions (Signed)
Access Code: [email or web address]  URL: [email or web address]  Date: 03/10/2018  Prepared by: Sherol Dade   Exercises  Hip Flexor Stretch on Step - 3 sets - 20 hold - 1x daily - 7x weekly  Tandem Walking with Counter Support - 3 sets - 30 hold - 1x daily - 7x weekly  Single Leg Stance - 3 sets - 15 hold - 1x daily - 7x weekly  Side Stepping with Resistance at Feet - 10 reps - 2 sets - 1x daily - 7x weekly    [facility] [address], [place], [place] [address] Phone # [phone] Fax [phone]

## 2018-03-10 NOTE — Therapy (Signed)
[facility] [address] [place], [place], [address] Phone: [phone]   Fax:  [phone]  Physical Therapy Treatment/Discharge  Patient Details  Name: Daniel Everett MRN: [id number] Date of Birth: 1931-08-11 Referring Provider (PT): Carolann Littler, MD   Encounter Date: 03/10/2018  PT End of Session - 03/10/18 0958    Visit Number  2    Number of Visits  2    Date for PT Re-Evaluation  04/13/18    Authorization Type  [employer] Medicare     Authorization Time Period  02/10/18 to 04/13/18    PT Start Time  0931    PT Stop Time  1012    PT Time Calculation (min)  41 min    Activity Tolerance  Patient tolerated treatment well;No increased pain    Behavior During Therapy  WFL for tasks assessed/performed       Past Medical History:  Diagnosis Date  . Allergic rhinitis due to pollen 06/07/2008  . Atrial fibrillation ([place])   . Benign essential HTN   . COLONIC POLYPS, HX OF 10/28/2006  . DIVERTICULOSIS, COLON 11/27/2006  . Gait abnormality 03/02/2018  . GEN OSTEOARTHROSIS INVOLVING MULTIPLE SITES 01/03/2010  . HYPERGLYCEMIA 10/18/2008  . HYPOTHYROIDISM 11/27/2006  . NEOPLASM, MALIGNANT, PROSTATE, HX OF, S/P TURP 05/28/2007  . OSTEOARTHRITIS 11/27/2006  . WEIGHT LOSS, RECENT 11/27/2006    Past Surgical History:  Procedure Laterality Date  . arthroscopy rt knee    . HYDROCELE EXCISION / REPAIR    . PROSTATE SURGERY     TURP  . ROTATOR CUFF REPAIR      There were no vitals filed for this visit.  Subjective Assessment - 03/10/18 0934    Subjective  Pt reports that he has not been working on his program as much as he would like to admit. He is maybe doing it twice a week.     Limitations  Standing;Writing;House hold activities;Lifting    Currently in Pain?  No/denies         [facility] PT Assessment - 03/10/18 0001      Assessment   Medical Diagnosis  High risk of falls    Referring Provider (PT)  Carolann Littler, MD     Onset Date/Surgical Date  --   3-4 weeks ago    Prior Therapy  none for this       Precautions   Precautions  None      Balance Screen   Has the patient fallen in the past 6 months  No    Has the patient had a decrease in activity level because of a fear of falling?   No    Is the patient reluctant to leave their home because of a fear of falling?   No      Prior Function   Level of Independence  Independent      Cognition   Overall Cognitive Status  Within Functional Limits for tasks assessed      Strength   Right Hip Flexion  5/5    Right Hip Extension  4/5    Right Hip ABduction  4/5    Left Hip Flexion  5/5    Left Hip Extension  4/5    Left Hip ABduction  4/5    Right Knee Flexion  5/5    Right Knee Extension  5/5    Left Knee Flexion  5/5    Left Knee Extension  5/5    Right Ankle  Dorsiflexion  5/5    Right Ankle Plantar Flexion  --   single leg heel raise x20 reps with UE assistance   Left Ankle Dorsiflexion  5/5    Left Ankle Plantar Flexion  --   single leg heel raise x20 reps with UE assistance     Transfers   Five time sit to stand comments   13 sec, UE on thighs      Standardized Balance Assessment   Standardized Balance Assessment  Timed Up and Go Test;Berg Balance Test      Berg Balance Test   Sit to Stand  Able to stand  independently using hands    Standing Unsupported  Able to stand safely 2 minutes    Sitting with Back Unsupported but Feet Supported on Floor or Stool  Able to sit safely and securely 2 minutes    Stand to Sit  Sits safely with minimal use of hands    Transfers  Able to transfer safely, minor use of hands    Standing Unsupported with Eyes Closed  Able to stand 10 seconds safely    Standing Ubsupported with Feet Together  Able to place feet together independently and stand 1 minute safely    From Standing, Reach Forward with Outstretched Arm  Can reach confidently >25 cm (10")    From Standing Position, Pick up Object from Floor  Able  to pick up shoe safely and easily    From Standing Position, Turn to Look Behind Over each Shoulder  Looks behind from both sides and weight shifts well    Turn 360 Degrees  Able to turn 360 degrees safely in 4 seconds or less    Standing Unsupported, Alternately Place Feet on Step/Stool  Able to stand independently and safely and complete 8 steps in 20 seconds    Standing Unsupported, One Foot in Front  Able to place foot tandem independently and hold 30 seconds    Standing on One Leg  Tries to lift leg/unable to hold 3 seconds but remains standing independently   Rt 2 sec, Lt 6 sec    Total Score  52      Timed Up and Go Test   TUG Comments  9 sec                    [facility] Adult PT Treatment/Exercise - 03/10/18 0001      Knee/Hip Exercises: Stretches   Knee: Self-Stretch to increase Flexion  Both;4 reps;10 seconds    Knee: Self-Stretch Limitations  supine     Piriformis Stretch  Right;Left;3 reps;20 seconds    Piriformis Stretch Limitations  supine       Knee/Hip Exercises: Standing   Heel Raises  1 set;Both;20 reps    Heel Raises Limitations  off half foam roll     Hip Abduction  Stengthening;Left;Right;2 sets;10 reps    Abduction Limitations  yellow TB around feet     SLS  5 sec on Lt, 4 sec on Rt     SLS with Vectors  3x10 sec Lt and Rt      Knee/Hip Exercises: Seated   Clamshell with TheraBand  Blue   x20 reps each             PT Education - 03/10/18 [phone]    Education Details  benefits of completing HEP atleast 3 days a week; discussed ways to complete HEP throughout the day    Person(s) Educated  Patient  Methods  Explanation;Verbal cues    Comprehension  Verbalized understanding;Returned demonstration          PT Long Term Goals - 03/10/18 1011      PT LONG TERM GOAL #1   Title  Pt will demo understanding and independence with his advanced HEP to improve flexibility, proprioception and decrease risk of falls.     Baseline  2x/week    Time   1    Period  Months    Status  Partially Met      PT LONG TERM GOAL #2   Title  Pt will be able to maintain single leg stance for atleast 10 sec on each LE, 2/3 trials, which will improve his stability and safety with stairs.     Baseline  5 sec    Time  2    Period  Months    Status  Not Met      PT LONG TERM GOAL #3   Title  Pt will demo improved hip flexibility, evident by atleast 5 deg active hip extension in prone testing, which will improve his mechanics with ambulation.     Time  2    Period  Months    Status  New            Plan - 03/10/18 2025    Clinical Impression Statement  Pt arrived today having completed his HEP up to 2 days a week since his evaluation. He feels ready for d/c from PT, as he is aware of the importance of completing his HEP and will have to find the time to do this moving forward. His single leg stance is slightly improved from the evaluation, up to 5 sec without LOB. Session focused on therapist education on ways to incorporate his exercises throughout the day and reviewed ways to progress this over time. Pt had good understanding of everything discussed and is agreeable with d/c today.     Rehab Potential  Good    PT Frequency  Monthy    PT Duration  Other (comment)   x2 months   PT Treatment/Interventions  ADLs/Self Care Home Management;Moist Heat;Therapeutic activities;Functional mobility training;Stair training;Gait training;Therapeutic exercise;Balance training;Neuromuscular re-education;Patient/family education;Passive range of motion;Taping;Vasopneumatic Device;Cryotherapy    PT Next Visit Plan  HEP and d/c     PT Home Exercise Plan  Access Code: [email or web address]     Consulted and Agree with Plan of Care  Patient       Patient will benefit from skilled therapeutic intervention in order to improve the following deficits and impairments:  Decreased endurance, Decreased activity tolerance, Decreased strength, Decreased mobility, Decreased balance,  Decreased range of motion, Impaired flexibility  Visit Diagnosis: Unsteadiness on feet  Muscle weakness (generalized)     Problem List Patient Active Problem List   Diagnosis Date Noted  . Gait abnormality 03/02/2018  . Primary osteoarthritis of right knee 06/25/2017  . Essential tremor 06/25/2016  . Atrial fibrillation ([place]) 03/26/2016  . GERD (gastroesophageal reflux disease) 02/08/2015  . Nocturia 02/08/2015  . Preventative health care 05/04/2010  . GEN OSTEOARTHROSIS INVOLVING MULTIPLE SITES 01/03/2010  . ONYCHOMYCOSIS 06/06/2009  . HOARSENESS, CHRONIC 10/18/2008  . 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 ALLERGIC RHINITIS DUE TO POLLEN 06/07/2008  . NEOPLASM, MALIGNANT, PROSTATE, HX OF, S/P TURP 05/28/2007  . Hypothyroidism 11/27/2006  . DIVERTICULOSIS, COLON 11/27/2006  . Osteoarthritis 11/27/2006  . Hyperlipidemia 10/28/2006  . Essential hypertension 10/28/2006  . History of colonic polyps 10/28/2006   PHYSICAL THERAPY DISCHARGE  SUMMARY  Visits from Start of Care: 2  Current functional level related to goals / functional outcomes: See above for more details    Remaining deficits: See above for more details    Education / Equipment: See above for more details  Plan: Patient agrees to discharge.  Patient goals were partially met. Patient is being discharged due to being pleased with the current functional level.  ?????         10:14 AM,03/10/18 Sherol Dade PT, DPT [place] [facility] [place]  [facility] [address] [place], [place], [address] Phone: [phone]   Fax:  [phone]  Name: Daniel Everett MRN: [id number] Date of Birth: [birth year]

## 2018-03-12 ENCOUNTER — Telehealth: Payer: Self-pay | Admitting: Cardiology

## 2018-03-12 NOTE — Telephone Encounter (Signed)
Spoke to patient he stated pharmacist gave him Ranitidine made my another manufacturer not affected by recall.Advised if not affected by recall ok to take.

## 2018-03-12 NOTE — Telephone Encounter (Signed)
New message   Pt c/o medication issue:  1. Name of Medication: ranitidine   2. How are you currently taking this medication (dosage and times per day)? 150 mg  1 time twice daily  3. Are you having a reaction (difficulty breathing--STAT)?no   4. What is your medication issue? Patient states that there is a recall on this medication. Please call to discuss options.

## 2018-03-12 NOTE — Telephone Encounter (Signed)
Returned call to patient no answer.LMTC. 

## 2018-03-16 DIAGNOSIS — L570 Actinic keratosis: Secondary | ICD-10-CM | POA: Diagnosis not present

## 2018-03-16 DIAGNOSIS — L300 Nummular dermatitis: Secondary | ICD-10-CM | POA: Diagnosis not present

## 2018-03-16 DIAGNOSIS — Z85828 Personal history of other malignant neoplasm of skin: Secondary | ICD-10-CM | POA: Diagnosis not present

## 2018-03-16 DIAGNOSIS — L819 Disorder of pigmentation, unspecified: Secondary | ICD-10-CM | POA: Diagnosis not present

## 2018-03-16 DIAGNOSIS — L578 Other skin changes due to chronic exposure to nonionizing radiation: Secondary | ICD-10-CM | POA: Diagnosis not present

## 2018-03-16 DIAGNOSIS — D1801 Hemangioma of skin and subcutaneous tissue: Secondary | ICD-10-CM | POA: Diagnosis not present

## 2018-03-16 DIAGNOSIS — D229 Melanocytic nevi, unspecified: Secondary | ICD-10-CM | POA: Diagnosis not present

## 2018-03-16 DIAGNOSIS — L814 Other melanin hyperpigmentation: Secondary | ICD-10-CM | POA: Diagnosis not present

## 2018-03-16 DIAGNOSIS — L821 Other seborrheic keratosis: Secondary | ICD-10-CM | POA: Diagnosis not present

## 2018-03-30 ENCOUNTER — Other Ambulatory Visit: Payer: Self-pay

## 2018-03-30 ENCOUNTER — Ambulatory Visit (INDEPENDENT_AMBULATORY_CARE_PROVIDER_SITE_OTHER): Payer: Medicare HMO | Admitting: Family Medicine

## 2018-03-30 ENCOUNTER — Encounter: Payer: Self-pay | Admitting: Family Medicine

## 2018-03-30 VITALS — BP 126/64 | HR 70 | Temp 97.9°F | Ht 76.0 in | Wt 195.6 lb

## 2018-03-30 DIAGNOSIS — G250 Essential tremor: Secondary | ICD-10-CM

## 2018-03-30 DIAGNOSIS — E038 Other specified hypothyroidism: Secondary | ICD-10-CM | POA: Diagnosis not present

## 2018-03-30 DIAGNOSIS — E7800 Pure hypercholesterolemia, unspecified: Secondary | ICD-10-CM | POA: Diagnosis not present

## 2018-03-30 DIAGNOSIS — I480 Paroxysmal atrial fibrillation: Secondary | ICD-10-CM

## 2018-03-30 DIAGNOSIS — I10 Essential (primary) hypertension: Secondary | ICD-10-CM

## 2018-03-30 NOTE — Progress Notes (Signed)
Subjective:     Patient ID: Daniel Everett, male   DOB: 1931/11/04, 85 y.o.   MRN: [id number]  HPI Patient seen for medical follow-up.  He continues to see his wife is a nursing home daily.  She is very diligent in her care.  Patient has a couple of widowed sisters that live here in town who make meals for him.  He is doing fairly well overall.  No recent falls.  Continues to have osteoarthritis issues with his knees.  Followed by sports medicine.  Essential tremor followed by neurology.  He has hypertension which is been stable.  No recent orthostatic symptoms.  No recent chest pains.  Other medical problems include history of GERD which is controlled with over-the-counter Zantac.  He does have some frequent burping but no dysphagia.  He has atrial fibrillation is on Eliquis.  No recent bleeding concerns.  Hyperlipidemia treated with Lipitor.  No myalgias.  Past Medical History:  Diagnosis Date  . Allergic rhinitis due to pollen 06/07/2008  . Atrial fibrillation ([place])   . Benign essential HTN   . COLONIC POLYPS, HX OF 10/28/2006  . DIVERTICULOSIS, COLON 11/27/2006  . Gait abnormality 03/02/2018  . GEN OSTEOARTHROSIS INVOLVING MULTIPLE SITES 01/03/2010  . HYPERGLYCEMIA 10/18/2008  . HYPOTHYROIDISM 11/27/2006  . NEOPLASM, MALIGNANT, PROSTATE, HX OF, S/P TURP 05/28/2007  . OSTEOARTHRITIS 11/27/2006  . WEIGHT LOSS, RECENT 11/27/2006   Past Surgical History:  Procedure Laterality Date  . arthroscopy rt knee    . HYDROCELE EXCISION / REPAIR    . PROSTATE SURGERY     TURP  . ROTATOR CUFF REPAIR      reports that he has never smoked. He has never used smokeless tobacco. He reports that he does not drink alcohol or use drugs. family history includes Cancer in his father; Coronary artery disease in his mother; Heart disease in his mother; Pancreatic cancer in his father; Tremor in his sister. No Known Allergies   Review of Systems  Constitutional: Negative for appetite change, fatigue and unexpected  weight change.  HENT: Negative for trouble swallowing.   Eyes: Negative for visual disturbance.  Respiratory: Negative for cough, chest tightness and shortness of breath.   Cardiovascular: Negative for chest pain, palpitations and leg swelling.  Gastrointestinal: Negative for abdominal pain.  Genitourinary: Negative for dysuria.  Neurological: Positive for tremors. Negative for dizziness, syncope, weakness, light-headedness and headaches.  Psychiatric/Behavioral: Negative for confusion.       Objective:   Physical Exam Constitutional:      Appearance: He is well-developed.  HENT:     Right Ear: External ear normal.     Left Ear: External ear normal.  Eyes:     Pupils: Pupils are equal, round, and reactive to light.  Neck:     Musculoskeletal: Neck supple.     Thyroid: No thyromegaly.  Cardiovascular:     Rate and Rhythm: Normal rate.     Comments: Irregular rhythm with rate around 68 Pulmonary:     Effort: Pulmonary effort is normal. No respiratory distress.     Breath sounds: Normal breath sounds. No wheezing or rales.  Musculoskeletal:     Right lower leg: No edema.     Left lower leg: No edema.  Neurological:     Mental Status: He is alert and oriented to person, place, and time.        Assessment:     #1 hypertension stable and at goal  #2 hyperlipidemia treated with Lipitor  #  3 hypothyroidism.  He had TSH in July which was normal  #4 essential tremor  #5 chronic atrial fibrillation which is rate controlled.  He remains on Eliquis    Plan:     -Continue current medication regimen -Routine follow-up in 6 months.  Plan to get follow-up labs there including thyroid functions, lipid, chemistries  [name] MD [facility]

## 2018-04-08 ENCOUNTER — Ambulatory Visit: Payer: Medicare HMO | Admitting: Neurology

## 2018-04-08 ENCOUNTER — Ambulatory Visit (INDEPENDENT_AMBULATORY_CARE_PROVIDER_SITE_OTHER): Payer: Medicare HMO | Admitting: Neurology

## 2018-04-08 ENCOUNTER — Encounter: Payer: Self-pay | Admitting: Neurology

## 2018-04-08 DIAGNOSIS — G629 Polyneuropathy, unspecified: Secondary | ICD-10-CM

## 2018-04-08 DIAGNOSIS — G603 Idiopathic progressive neuropathy: Secondary | ICD-10-CM

## 2018-04-08 DIAGNOSIS — R269 Unspecified abnormalities of gait and mobility: Secondary | ICD-10-CM

## 2018-04-08 HISTORY — DX: Polyneuropathy, unspecified: G62.9

## 2018-04-08 NOTE — Progress Notes (Addendum)
The patient comes in today for EMG and nerve conduction study evaluation.  The nerve conductions show evidence of a primarily axonal peripheral neuropathy of moderate severity.  EMG of the right lower extremity shows chronic distal stable signs of denervation consistent with the diagnosis of peripheral neuropathy, no evidence of a lumbosacral radiculopathy was noted.  Further blood work will be done today looking for the etiology of the peripheral neuropathy.  The patient fortunately does not have pain associated with the neuropathy.     [place]    Nerve / Sites Muscle Latency Ref. Amplitude Ref. Rel Amp Segments Distance Velocity Ref. Area    ms ms mV mV %  cm m/s m/s mVms  R Peroneal - EDB     Ankle EDB 6.0 ?6.5 2.3 ?2.0 100 Ankle - EDB 9   9.5     Fib head EDB 15.2  1.9  83.2 Fib head - Ankle 36 39 ?44 8.5     Pop fossa EDB 17.7  1.8  94.2 Pop fossa - Fib head 10 41 ?44 8.2         Pop fossa - Ankle      L Peroneal - EDB     Ankle EDB 6.4 ?6.5 1.0 ?2.0 100 Ankle - EDB 9   3.2     Fib head EDB 16.8  0.9  83.6 Fib head - Ankle 36 34 ?44 3.0     Pop fossa EDB 19.7  0.7  83 Pop fossa - Fib head 10 34 ?44 2.7         Pop fossa - Ankle      R Tibial - AH     Ankle AH 5.0 ?5.8 2.7 ?4.0 100 Ankle - AH 9   9.8     Pop fossa AH 17.5  2.2  83.2 Pop fossa - Ankle 43 34 ?41 5.4  L Tibial - AH     Ankle AH 5.3 ?5.8 2.6 ?4.0 100 Ankle - AH 9   9.2     Pop fossa AH 17.1  1.7  67.1 Pop fossa - Ankle 43 36 ?41 5.8             SNC    Nerve / Sites Rec. Site Peak Lat Ref.  Amp Ref. Segments Distance    ms ms V V  cm  R Sural - Ankle (Calf)     Calf Ankle NR ?4.4 NR ?6 Calf - Ankle 14  L Sural - Ankle (Calf)     Calf Ankle NR ?4.4 NR ?6 Calf - Ankle 14  R Superficial peroneal - Ankle     Lat leg Ankle NR ?4.4 NR ?6 Lat leg - Ankle 14  L Superficial peroneal - Ankle     Lat leg Ankle NR ?4.4 NR ?6 Lat leg - Ankle 14              F  Wave    Nerve F Lat Ref.   ms ms  R Tibial - AH 70.3 ?56.0   L Tibial - AH 71.0 ?56.0

## 2018-04-08 NOTE — Progress Notes (Signed)
Please refer to EMG and nerve conduction procedure note.  

## 2018-04-08 NOTE — Procedures (Signed)
     HISTORY:  Daniel Everett is an 85 year old gentleman with a history of an essential tremor.  The patient has noted a change in his balance over the last year.  He has been noted to have decreased position sensation in both feet, he is being evaluated for a possible peripheral neuropathy.  NERVE CONDUCTION STUDIES:  Nerve conduction studies were performed on both lower extremities.  The distal motor latencies for the peroneal and posterior tibial nerves were within normal limits bilaterally with a low motor amplitudes for the posterior tibial nerves bilaterally and for the left peroneal nerve, normal for the right peroneal nerve.  Slowing was seen for the peroneal and posterior tibial nerves bilaterally.  The sural and peroneal sensory latencies were unobtainable bilaterally.  The posterior tibial F wave latencies were prolonged bilaterally.  EMG STUDIES:  EMG study was performed on the right lower extremity:  The tibialis anterior muscle reveals 2 to 5K motor units with decreased recruitment. No fibrillations or positive waves were seen. The peroneus tertius muscle reveals 2 to 6K motor units with significantly decreased recruitment. No fibrillations or positive waves were seen. The medial gastrocnemius muscle reveals 1 to 4K motor units with decreased recruitment. No fibrillations or positive waves were seen. The vastus lateralis muscle reveals 2 to 4K motor units with full recruitment. No fibrillations or positive waves were seen. The iliopsoas muscle reveals 2 to 4K motor units with full recruitment. No fibrillations or positive waves were seen. The biceps femoris muscle (long head) reveals 2 to 4K motor units with full recruitment. No fibrillations or positive waves were seen. The lumbosacral paraspinal muscles were tested at 3 levels, and revealed no abnormalities of insertional activity at all 3 levels tested. There was good relaxation.   IMPRESSION:  Nerve conduction studies done  on both lower extremities shows evidence of a primarily axonal peripheral neuropathy of moderate severity.  EMG evaluation of the right lower extremity shows chronic stable distal signs of neuropathic denervation consistent with the diagnosis of peripheral neuropathy.  There is no evidence of an overlying lumbosacral radiculopathy.  Jill Alexanders MD 04/08/2018 2:48 PM  [facility] [address] [place],  [address]  Phone [phone] Fax [phone]

## 2018-04-10 ENCOUNTER — Emergency Department (HOSPITAL_COMMUNITY): Payer: Medicare HMO

## 2018-04-10 ENCOUNTER — Emergency Department (HOSPITAL_COMMUNITY)
Admission: EM | Admit: 2018-04-10 | Discharge: 2018-04-10 | Disposition: A | Discharge status: Home / Self Care | Payer: Medicare HMO | Attending: Emergency Medicine | Admitting: Emergency Medicine

## 2018-04-10 ENCOUNTER — Encounter (HOSPITAL_COMMUNITY): Payer: Self-pay | Admitting: Emergency Medicine

## 2018-04-10 DIAGNOSIS — Y999 Unspecified external cause status: Secondary | ICD-10-CM | POA: Insufficient documentation

## 2018-04-10 DIAGNOSIS — D075 Carcinoma in situ of prostate: Secondary | ICD-10-CM | POA: Insufficient documentation

## 2018-04-10 DIAGNOSIS — M546 Pain in thoracic spine: Secondary | ICD-10-CM | POA: Diagnosis not present

## 2018-04-10 DIAGNOSIS — Z79899 Other long term (current) drug therapy: Secondary | ICD-10-CM | POA: Diagnosis not present

## 2018-04-10 DIAGNOSIS — I10 Essential (primary) hypertension: Secondary | ICD-10-CM | POA: Diagnosis not present

## 2018-04-10 DIAGNOSIS — Y9301 Activity, walking, marching and hiking: Secondary | ICD-10-CM | POA: Insufficient documentation

## 2018-04-10 DIAGNOSIS — W19XXXA Unspecified fall, initial encounter: Secondary | ICD-10-CM

## 2018-04-10 DIAGNOSIS — M545 Low back pain: Secondary | ICD-10-CM | POA: Insufficient documentation

## 2018-04-10 DIAGNOSIS — E039 Hypothyroidism, unspecified: Secondary | ICD-10-CM | POA: Diagnosis not present

## 2018-04-10 DIAGNOSIS — S299XXA Unspecified injury of thorax, initial encounter: Secondary | ICD-10-CM | POA: Diagnosis not present

## 2018-04-10 DIAGNOSIS — W109XXA Fall (on) (from) unspecified stairs and steps, initial encounter: Secondary | ICD-10-CM | POA: Diagnosis not present

## 2018-04-10 DIAGNOSIS — Y929 Unspecified place or not applicable: Secondary | ICD-10-CM | POA: Insufficient documentation

## 2018-04-10 DIAGNOSIS — Z7901 Long term (current) use of anticoagulants: Secondary | ICD-10-CM | POA: Insufficient documentation

## 2018-04-10 MED ORDER — TRAMADOL HCL 50 MG PO TABS: ORAL_TABLET | ORAL | 0 refills | Status: DC

## 2018-04-10 MED ORDER — HYDROCODONE-ACETAMINOPHEN 5-325 MG PO TABS
1.0000 | ORAL_TABLET | Freq: Once | ORAL | Status: AC
  Administered 2018-04-10: 1 via ORAL
  Filled 2018-04-10: qty 1

## 2018-04-10 NOTE — ED Provider Notes (Signed)
[place] DEPT Provider Note   CSN: [id number] Arrival date & time: 04/10/18  1600     History   Chief Complaint Chief Complaint  Patient presents with  . Fall  . Back Pain    HPI Daniel Everett is a 85 y.o. male.  Patient fell on his mid back and complains of pain in his lower thoracic spine patient did not hit his head.  He has no neck pain  The history is provided by the patient. No language interpreter was used.  Fall  This is a new problem. The current episode started 12 to 24 hours ago. The problem occurs rarely. The problem has been gradually worsening. Pertinent negatives include no chest pain, no abdominal pain and no headaches. Exacerbated by: Movement. Nothing relieves the symptoms. He has tried nothing for the symptoms. The treatment provided no relief.    Past Medical History:  Diagnosis Date  . Allergic rhinitis due to pollen 06/07/2008  . Atrial fibrillation ([place])   . Benign essential HTN   . COLONIC POLYPS, HX OF 10/28/2006  . DIVERTICULOSIS, COLON 11/27/2006  . Gait abnormality 03/02/2018  . GEN OSTEOARTHROSIS INVOLVING MULTIPLE SITES 01/03/2010  . HYPERGLYCEMIA 10/18/2008  . HYPOTHYROIDISM 11/27/2006  . NEOPLASM, MALIGNANT, PROSTATE, HX OF, S/P TURP 05/28/2007  . OSTEOARTHRITIS 11/27/2006  . Peripheral neuropathy 04/08/2018  . WEIGHT LOSS, RECENT 11/27/2006    Patient Active Problem List   Diagnosis Date Noted  . Peripheral neuropathy 04/08/2018  . Gait abnormality 03/02/2018  . Primary osteoarthritis of right knee 06/25/2017  . Essential tremor 06/25/2016  . Atrial fibrillation ([place]) 03/26/2016  . GERD (gastroesophageal reflux disease) 02/08/2015  . Nocturia 02/08/2015  . Preventative health care 05/04/2010  . GEN OSTEOARTHROSIS INVOLVING MULTIPLE SITES 01/03/2010  . ONYCHOMYCOSIS 06/06/2009  . HOARSENESS, CHRONIC 10/18/2008  . 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 ALLERGIC RHINITIS DUE TO POLLEN 06/07/2008  .  NEOPLASM, MALIGNANT, PROSTATE, HX OF, S/P TURP 05/28/2007  . Hypothyroidism 11/27/2006  . DIVERTICULOSIS, COLON 11/27/2006  . Osteoarthritis 11/27/2006  . Hyperlipidemia 10/28/2006  . Essential hypertension 10/28/2006  . History of colonic polyps 10/28/2006    Past Surgical History:  Procedure Laterality Date  . arthroscopy rt knee    . HYDROCELE EXCISION / REPAIR    . PROSTATE SURGERY     TURP  . ROTATOR CUFF REPAIR          Home Medications    Prior to Admission medications   Medication Sig Start Date End Date Taking? Authorizing Provider  ACETAMINOPHEN PO Take 650 mg by mouth 2 (two) times daily. Take one tablet twice a day.    Yes [provider]  amLODipine (NORVASC) 5 MG tablet Take 1 tablet (5 mg total) by mouth daily. 05/15/17  Yes Martinique, Peter M, MD  apixaban (ELIQUIS) 5 MG TABS tablet Take 1 tablet (5 mg total) by mouth 2 (two) times daily. 05/15/17  Yes [place], Peter M, MD  atorvastatin (LIPITOR) 20 MG tablet Take 1 tablet (20 mg total) by mouth daily. 05/15/17  Yes [place], Peter M, MD  finasteride (PROSCAR) 5 MG tablet TAKE 1 TABLET DAILY 03/02/18  Yes Burchette, Alinda Sierras, MD  hydrochlorothiazide (MICROZIDE) 12.5 MG capsule Take 1 capsule (12.5 mg total) by mouth daily. 05/15/17  Yes [place], Peter M, MD  latanoprost (XALATAN) 0.005 % ophthalmic solution Place 1 drop into both eyes at bedtime.    Yes [provider]  levothyroxine (SYNTHROID, LEVOTHROID) 75 MCG tablet TAKE 1  TABLET DAILY 03/02/18  Yes Burchette, Alinda Sierras, MD  lisinopril (PRINIVIL,ZESTRIL) 20 MG tablet Take 1 tablet (20 mg total) by mouth daily. 05/15/17  Yes [place], Peter M, MD  Multiple Vitamin (MULTIVITAMIN) tablet Take 1 tablet by mouth daily.     Yes [provider]  Pyridoxine HCl (VITAMIN B-6) 500 MG tablet Take 500 mg by mouth daily.   Yes [provider]  ranitidine (ZANTAC) 150 MG capsule Take 150 mg by mouth 2 (two) times daily.   Yes [provider]    timolol (TIMOPTIC) 0.5 % ophthalmic solution Place 1 drop into both eyes 2 (two) times daily.     Yes [provider]  fluorouracil (EFUDEX) 5 % cream Apply topically 2 (two) times daily.    [provider]  traMADol Veatrice Bourbon) 50 MG tablet Take 1 every 6 hours for pain not helped by Tylenol alone 04/10/18   Milton Ferguson, MD    Family History Family History  Problem Relation Age of Onset  . Coronary artery disease Mother   . Heart disease Mother   . Pancreatic cancer Father   . Cancer Father   . Tremor Sister     Social History Social History   Tobacco Use  . Smoking status: Never Smoker  . Smokeless tobacco: Never Used  Substance Use Topics  . Alcohol use: No  . Drug use: No     Allergies   Patient has no known allergies.   Review of Systems Review of Systems  Constitutional: Negative for appetite change and fatigue.  HENT: Negative for congestion, ear discharge and sinus pressure.   Eyes: Negative for discharge.  Respiratory: Negative for cough.   Cardiovascular: Negative for chest pain.  Gastrointestinal: Negative for abdominal pain and diarrhea.  Genitourinary: Negative for frequency and hematuria.  Musculoskeletal: Positive for back pain.  Skin: Negative for rash.  Neurological: Negative for seizures and headaches.  Psychiatric/Behavioral: Negative for hallucinations.     Physical Exam Updated Vital Signs BP (!) 151/85 (BP Location: Right Arm)   Pulse 69   Temp 98.3 F (36.8 C) (Oral)   Resp 16   SpO2 100%   Physical Exam Vitals signs and nursing note reviewed.  Constitutional:      Appearance: He is well-developed.  HENT:     Head: Normocephalic.     Nose: Nose normal.  Eyes:     General: No scleral icterus.    Conjunctiva/sclera: Conjunctivae normal.  Neck:     Musculoskeletal: Neck supple.     Thyroid: No thyromegaly.  Cardiovascular:     Rate and Rhythm: Normal rate and regular rhythm.     Heart sounds: No murmur. No  friction rub. No gallop.   Pulmonary:     Breath sounds: No stridor. No wheezing or rales.  Chest:     Chest wall: No tenderness.  Abdominal:     General: There is no distension.     Tenderness: There is no abdominal tenderness. There is no rebound.  Musculoskeletal: Normal range of motion.     Comments: Tender distal thoracic spine  Lymphadenopathy:     Cervical: No cervical adenopathy.  Skin:    Findings: No erythema or rash.  Neurological:     Mental Status: He is oriented to person, place, and time.     Motor: No abnormal muscle tone.     Coordination: Coordination normal.  Psychiatric:        Behavior: Behavior normal.      ED  Treatments / Results  Labs (all labs ordered are listed, but only abnormal results are displayed) Labs Reviewed - No data to display  EKG None  Radiology Dg Chest 2 View  Result Date: 04/10/2018 CLINICAL DATA:  Status post fall, low back pain EXAM: CHEST - 2 VIEW COMPARISON:  03/22/2016 FINDINGS: There is no focal parenchymal opacity. There is no pleural effusion or pneumothorax. There is stable cardiomegaly. The osseous structures are unremarkable. IMPRESSION: No active cardiopulmonary disease. Electronically Signed   By: Kathreen Devoid   On: 04/10/2018 17:47    Procedures Procedures (including critical care time)  Medications Ordered in ED Medications  HYDROcodone-acetaminophen (NORCO/VICODIN) 5-325 MG per tablet 1 tablet (has no administration in time range)     Initial Impression / Assessment and Plan / ED Course  I have reviewed the triage vital signs and the nursing notes.  Pertinent labs & imaging results that were available during my care of the patient were reviewed by me and considered in my medical decision making (see chart for details).     X-rays unremarkable.  Patient given Ultram for pain and will follow-up as needed for the contusion  Final Clinical Impressions(s) / ED Diagnoses   Final diagnoses:  Fall, initial  encounter    ED Discharge Orders         Ordered    traMADol (ULTRAM) 50 MG tablet     04/10/18 Colon Flattery, MD 04/10/18 Bosie Helper

## 2018-04-10 NOTE — ED Triage Notes (Signed)
Pt reports that he had box of food when he missed stepped leaving his sister's house and fell. Pt c/o mid to lower back pains. Denies hitting head or LOC. Pt is on Eliquis.

## 2018-04-10 NOTE — Discharge Instructions (Addendum)
Follow-up with your doctor next week if any problems 

## 2018-04-11 LAB — MULTIPLE MYELOMA PANEL, SERUM
Albumin SerPl Elph-Mcnc: 3.5 g/dL (ref 2.9–4.4)
Albumin/Glob SerPl: 1.3 (ref 0.7–1.7)
Alpha 1: 0.2 g/dL (ref 0.0–0.4)
Alpha2 Glob SerPl Elph-Mcnc: 0.7 g/dL (ref 0.4–1.0)
B-Globulin SerPl Elph-Mcnc: 0.9 g/dL (ref 0.7–1.3)
Gamma Glob SerPl Elph-Mcnc: 0.8 g/dL (ref 0.4–1.8)
Globulin, Total: 2.7 g/dL (ref 2.2–3.9)
IGG (IMMUNOGLOBIN G), SERUM: 1006 mg/dL (ref 700–1600)
IgA/Immunoglobulin A, Serum: 153 mg/dL (ref 61–437)
IgM (Immunoglobulin M), Srm: 46 mg/dL (ref 15–143)
Total Protein: 6.2 g/dL (ref 6.0–8.5)

## 2018-04-11 LAB — B. BURGDORFI ANTIBODIES: Lyme IgG/IgM Ab: <0.91 {ISR} (ref 0.00–0.90)

## 2018-04-11 LAB — ANGIOTENSIN CONVERTING ENZYME: Angio Convert Enzyme: 7 U/L — ABNORMAL LOW (ref 14–82)

## 2018-04-11 LAB — ANA W/REFLEX: Anti Nuclear Antibody(ANA): NEGATIVE

## 2018-04-11 LAB — COPPER, SERUM: Copper: 87 ug/dL (ref 72–166)

## 2018-04-13 ENCOUNTER — Telehealth: Payer: Self-pay

## 2018-04-13 ENCOUNTER — Telehealth: Payer: Self-pay | Admitting: Neurology

## 2018-04-13 NOTE — Telephone Encounter (Signed)
I contacted the pt and was able to discuss recent fall. Pt states on 04/10/18 he was visiting his sister and fell down 4-5 stairs. Pt states he fell and hit his lower back. Pt denied hitting his head or LOC. After the fall, pt reported to the ED where he had a xray to check for any breaks,factures in his back and was advised the scan was negative for breaks. Pt wanted MD to be advised because he is still struggling with his sense of balance. I advised I would fwd to MD to further review/advise. Pt was agreeable.

## 2018-04-13 NOTE — Telephone Encounter (Signed)
I was able to call pt and advise on unremarkable lab result. Pt verbalized understanding.

## 2018-04-13 NOTE — Telephone Encounter (Signed)
-----   Message from Kathrynn Ducking, MD sent at 04/12/2018  4:55 PM EST -----  The blood work results are unremarkable. Please call the patient. ----- Message ----- From: Lavone Neri Lab Results In Sent: 04/09/2018   7:39 AM EST To: Kathrynn Ducking, MD

## 2018-04-13 NOTE — Telephone Encounter (Signed)
Pt is asking for a call to discuss a recent fall since he last saw Dr Jannifer Franklin

## 2018-04-13 NOTE — Telephone Encounter (Signed)
I called the patient.  The patient fell on 17 of January, he did not hit his head, no loss of conscious.  He has some back pain, no evidence of fractures on evaluation.  A CT scan of the brain was ordered, it has not yet been done.  I will call him once the results are available.

## 2018-04-21 DIAGNOSIS — Z85828 Personal history of other malignant neoplasm of skin: Secondary | ICD-10-CM | POA: Diagnosis not present

## 2018-04-21 DIAGNOSIS — L578 Other skin changes due to chronic exposure to nonionizing radiation: Secondary | ICD-10-CM | POA: Diagnosis not present

## 2018-04-21 DIAGNOSIS — L570 Actinic keratosis: Secondary | ICD-10-CM | POA: Diagnosis not present

## 2018-04-21 DIAGNOSIS — C44319 Basal cell carcinoma of skin of other parts of face: Secondary | ICD-10-CM | POA: Diagnosis not present

## 2018-04-21 DIAGNOSIS — L819 Disorder of pigmentation, unspecified: Secondary | ICD-10-CM | POA: Diagnosis not present

## 2018-04-21 DIAGNOSIS — D485 Neoplasm of uncertain behavior of skin: Secondary | ICD-10-CM | POA: Diagnosis not present

## 2018-04-23 IMAGING — CR DG CHEST 2V
2 series · 2 of 2 positions shown · non-contrast
Comparison: 07/01/2010

CLINICAL DATA: Irregular heart rate

EXAM:
CHEST  2 VIEW

[w chest pa]
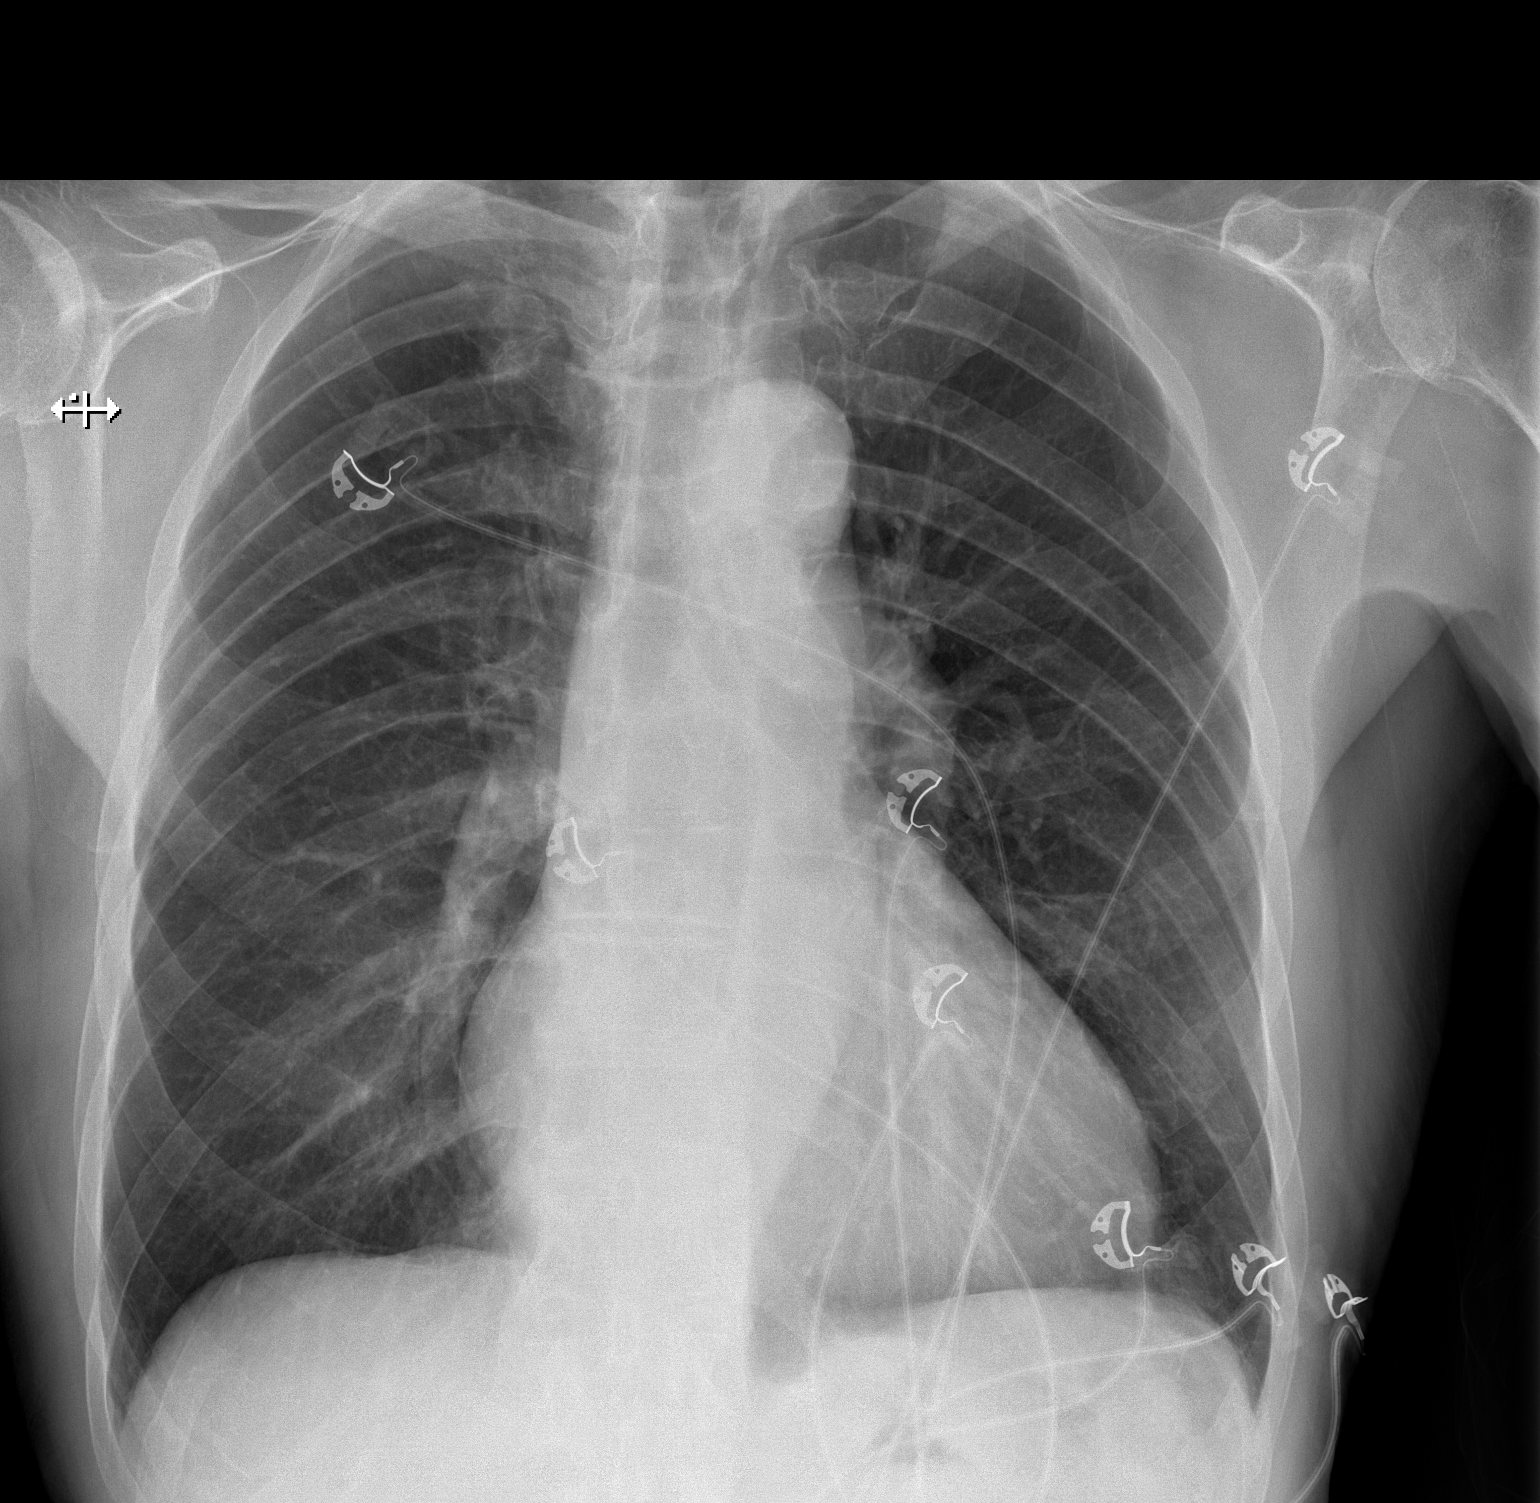

[w chest lat]
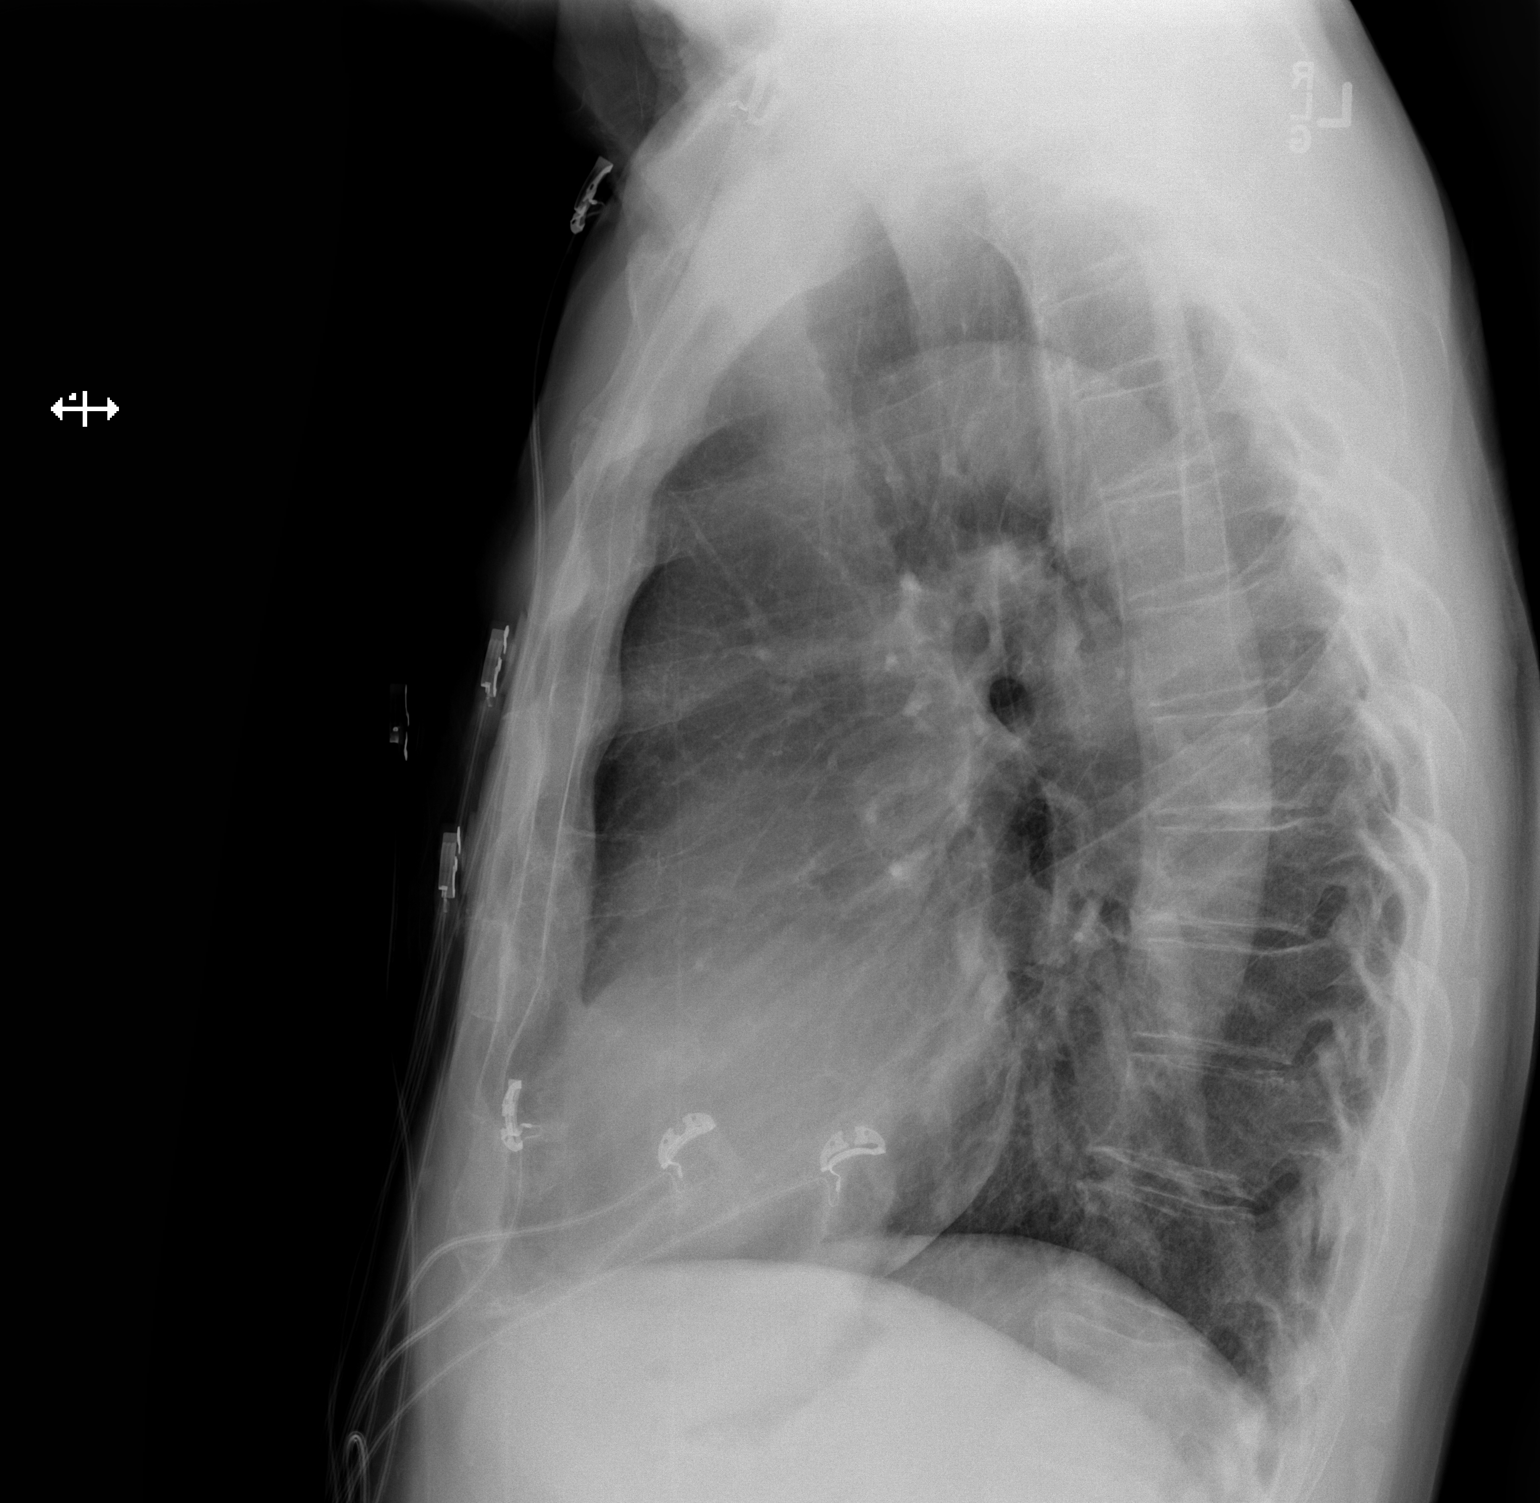

[2 of 2 positions shown; findings below may reference images not displayed]

FINDINGS: The heart size and mediastinal contours are within normal limits.
Both lungs are clear. The visualized skeletal structures are
unremarkable.
IMPRESSION: No active cardiopulmonary disease.

## 2018-04-24 ENCOUNTER — Other Ambulatory Visit: Payer: Self-pay

## 2018-04-24 MED ORDER — AMLODIPINE BESYLATE 5 MG PO TABS: 5.0000 mg | ORAL_TABLET | Freq: Every day | ORAL | 0 refills | Status: DC

## 2018-04-24 MED ORDER — ATORVASTATIN CALCIUM 20 MG PO TABS: 20.0000 mg | ORAL_TABLET | Freq: Every day | ORAL | 0 refills | Status: DC

## 2018-04-24 MED ORDER — HYDROCHLOROTHIAZIDE 12.5 MG PO CAPS: 12.5000 mg | ORAL_CAPSULE | Freq: Every day | ORAL | 0 refills | Status: DC

## 2018-04-24 MED ORDER — RANITIDINE HCL 150 MG PO CAPS: 150.0000 mg | ORAL_CAPSULE | Freq: Two times a day (BID) | ORAL | 0 refills | Status: DC

## 2018-04-24 MED ORDER — LISINOPRIL 20 MG PO TABS: 20.0000 mg | ORAL_TABLET | Freq: Every day | ORAL | 0 refills | Status: DC

## 2018-04-24 NOTE — Telephone Encounter (Signed)
Rx(s) sent to pharmacy electronically.  

## 2018-05-04 NOTE — Progress Notes (Signed)
Cardiology Office Note   Date:  05/07/2018   ID:  Daniel Everett, DOB [birth year], MRN [id number]  PCP:  [name], MD  Cardiologist:  Dr Martinique  [name], MD 03/27/2016   History of Present Illness: Daniel Everett is a 85 y.o. male with a history of hypothyroid, hyperglycemia, prostate CA s/p TURP, OA, colon polyps, HTN, RBBB- seen for follow up of Atrial fibrillation.  He was initially seen by Rosaria Ferries PA-C in January 2018 for atrial fibrillation with  HR 50s at baseline, on Xarelto. On no AV nodal blocking agents. Holter ordered. BP up, HCTZ added. BMET 1 week later was ok. Holter w/ no pauses > 3.2 sec, HR generally slow, 52 avg, lowest 21 at 5 am. Echo showed normal EF. Mild MR, mod TR. Severe biatrial enlargement.   He has been seen by Dr. Jannifer Franklin in January for evaluation of tremor, gait instability.  EMG c/w peripheral neuropathy. Other lab studies normal.  Patient does note problems with his balance. In January he did have a syncopal episode. States he was walking down steps and then he doesn't know what happened but he landed on his back on the sidewalk. Cannot really recall if he blacked out. Went to ED and Xrays showed no fracture. He denies any real lightheadedness but does note a lot of issues with balance. No dyspnea or chest pain.      Past Medical History:  Diagnosis Date  . Allergic rhinitis due to pollen 06/07/2008  . Atrial fibrillation ([place])   . Benign essential HTN   . COLONIC POLYPS, HX OF 10/28/2006  . DIVERTICULOSIS, COLON 11/27/2006  . Gait abnormality 03/02/2018  . GEN OSTEOARTHROSIS INVOLVING MULTIPLE SITES 01/03/2010  . HYPERGLYCEMIA 10/18/2008  . HYPOTHYROIDISM 11/27/2006  . NEOPLASM, MALIGNANT, PROSTATE, HX OF, S/P TURP 05/28/2007  . OSTEOARTHRITIS 11/27/2006  . Peripheral neuropathy 04/08/2018  . WEIGHT LOSS, RECENT 11/27/2006    Past Surgical History:  Procedure Laterality Date  . arthroscopy rt knee    . HYDROCELE EXCISION / REPAIR     . PROSTATE SURGERY     TURP  . ROTATOR CUFF REPAIR      Current Outpatient Medications  Medication Sig Dispense Refill  . ACETAMINOPHEN PO Take 650 mg by mouth 2 (two) times daily. Take one tablet twice a day.     Marland Kitchen amLODipine (NORVASC) 5 MG tablet Take 1 tablet (5 mg total) by mouth daily. MUST KEEP APPOINTMENT 05/07/18 WITH DR Martinique FOR FUTURE REFILLS 30 tablet 0  . apixaban (ELIQUIS) 5 MG TABS tablet Take 1 tablet (5 mg total) by mouth 2 (two) times daily. 180 tablet 3  . atorvastatin (LIPITOR) 20 MG tablet Take 1 tablet (20 mg total) by mouth daily. MUST KEEP APPOINTMENT 05/07/18 WITH DR Martinique FOR FUTURE REFILLS 30 tablet 0  . finasteride (PROSCAR) 5 MG tablet TAKE 1 TABLET DAILY 90 tablet 2  . fluorouracil (EFUDEX) 5 % cream Apply topically 2 (two) times daily.    . hydrochlorothiazide (MICROZIDE) 12.5 MG capsule Take 1 capsule (12.5 mg total) by mouth daily. MUST KEEP APPOINTMENT 05/07/18 WITH DR Martinique FOR FUTURE REFILLS 30 capsule 0  . latanoprost (XALATAN) 0.005 % ophthalmic solution Place 1 drop into both eyes at bedtime.     Marland Kitchen levothyroxine (SYNTHROID, LEVOTHROID) 75 MCG tablet TAKE 1 TABLET DAILY 90 tablet 2  . lisinopril (PRINIVIL,ZESTRIL) 20 MG tablet Take 1 tablet (20 mg total) by mouth daily. MUST KEEP APPOINTMENT 05/07/18 WITH DR  Martinique FOR FUTURE REFILLS 30 tablet 0  . Multiple Vitamin (MULTIVITAMIN) tablet Take 1 tablet by mouth daily.      . Pyridoxine HCl (VITAMIN B-6) 500 MG tablet Take 500 mg by mouth daily.    . timolol (TIMOPTIC) 0.5 % ophthalmic solution Place 1 drop into both eyes 2 (two) times daily.      . traMADol (ULTRAM) 50 MG tablet Take 1 every 6 hours for pain not helped by Tylenol alone 15 tablet 0  . ranitidine (ZANTAC) 150 MG tablet Take 1 tablet (150 mg total) by mouth 2 (two) times daily. 180 tablet 3   No current facility-administered medications for this visit.     Allergies:   Patient has no known allergies.    Social History:  The patient   reports that he has never smoked. He has never used smokeless tobacco. He reports that he does not drink alcohol or use drugs.   Family History:  The patient's family history includes Cancer in his father; Coronary artery disease in his mother; Heart disease in his mother; Pancreatic cancer in his father; Tremor in his sister.    ROS:  Please see the history of present illness. All other systems are reviewed and negative.    PHYSICAL EXAM: VS:  BP 108/64   Pulse (!) 42   Ht 6\' 4"  (1.93 m)   Wt 201 lb (91.2 kg)   BMI 24.47 kg/m  , BMI Body mass index is 24.47 kg/m. GENERAL:  Well appearing, elderly WM in NAD HEENT:  PERRL, EOMI, sclera are clear. Oropharynx is clear. NECK:  No jugular venous distention, carotid upstroke brisk and symmetric, no bruits, no thyromegaly or adenopathy LUNGS:  Clear to auscultation bilaterally CHEST:  Unremarkable HEART:  IRRR,  Rate is very slow.  PMI not displaced or sustained,S1 and S2 within normal limits, no S3, no S4: no clicks, no rubs, no murmurs ABD:  Soft, nontender. BS +, no masses or bruits. No hepatomegaly, no splenomegaly EXT:  2 + pulses throughout, no edema, no cyanosis no clubbing SKIN:  Warm and dry.  No rashes NEURO:  Alert and oriented x 3. Cranial nerves II through XII intact. PSYCH:  Cognitively intact     EKG:  EKG is ordered today. Afib rate 42. Right axis. Nonspecific TWA.  I have personally reviewed and interpreted this study.   ECHO: 04/08/2016 - Left ventricle: The cavity size was normal. Wall thickness was   normal. Systolic function was normal. The estimated ejection   fraction was in the range of 55% to 60%. Wall motion was normal;   there were no regional wall motion abnormalities. The study is   not technically sufficient to allow evaluation of LV diastolic   function. - Aortic valve: There was trivial regurgitation. - Mitral valve: There was mild regurgitation. - Left atrium: The atrium was severely dilated. -  Right atrium: The atrium was severely dilated. - Atrial septum: No defect or patent foramen ovale was identified. - Tricuspid valve: There was moderate regurgitation.  Recent Labs: 09/26/2017: BUN 11; Creatinine, Ser 0.88; Hemoglobin 15.2; Platelets 183.0; Potassium 4.3; Sodium 136; TSH 2.17    Lipid Panel    Component Value Date/Time   CHOL 134 12/25/2016 0827   TRIG 52.0 12/25/2016 0827   HDL 44.90 12/25/2016 0827   CHOLHDL 3 12/25/2016 0827   VLDL 10.4 12/25/2016 0827   LDLCALC 78 12/25/2016 0827     Wt Readings from Last 3 Encounters:  05/07/18 201 lb (  91.2 kg)  03/30/18 195 lb 9.6 oz (88.7 kg)  03/02/18 195 lb 8 oz (88.7 kg)     Other studies Reviewed: Additional studies/ records that were reviewed today include: office notes and testing.  Holter 04/04/16: Study Highlights    Atrial fibrillation with slow ventricular response  Rare PVCs and PVC couplets  Longest pause 3.2 seconds    Echo 04/08/16: Study Conclusions  - Left ventricle: The cavity size was normal. Wall thickness was   normal. Systolic function was normal. The estimated ejection   fraction was in the range of 55% to 60%. Wall motion was normal;   there were no regional wall motion abnormalities. The study is   not technically sufficient to allow evaluation of LV diastolic   function. - Aortic valve: There was trivial regurgitation. - Mitral valve: There was mild regurgitation. - Left atrium: The atrium was severely dilated. - Right atrium: The atrium was severely dilated. - Atrial septum: No defect or patent foramen ovale was identified. - Tricuspid valve: There was moderate regurgitation.  ASSESSMENT AND PLAN:  1.  Atrial fib- now chronic: he has a slow ventricular response. Apparent syncopal episode in January. It is possible he just lost his balance and fell but since he has no recollection I think we need to assume this was a syncopal event. With his very slow ventricular response in AFib on  no rate slowing medication I feel he needs to be considered for pacemaker implant. Will refer to EP.   He anticoagulated on Eliquis  2. Chronic anticoag: CHA2DS2VASc=3 (age x 2, HTN). Continue Eliquis.   3. HTN- controlled.  4. Peripheral Neuropathy. This certainly could be affecting his balance. Recommend he discuss this further with Dr. Jannifer Franklin.   Current medicines are reviewed at length with the patient today.  The patient has concerns regarding medicines. Concerns were addressed.  The following changes have been made:  no change  Labs/ tests ordered today include:   Orders Placed This Encounter  Procedures  . EKG 12-Lead     Disposition: EP evaluation. I will follow up in 6 months.  Signed, Shadoe Bethel Martinique, MD  05/07/2018 9:16 AM    [place]

## 2018-05-07 ENCOUNTER — Encounter (INDEPENDENT_AMBULATORY_CARE_PROVIDER_SITE_OTHER): Payer: Self-pay

## 2018-05-07 ENCOUNTER — Ambulatory Visit: Payer: Medicare HMO | Admitting: Cardiology

## 2018-05-07 ENCOUNTER — Encounter: Payer: Self-pay | Admitting: Cardiology

## 2018-05-07 VITALS — BP 108/64 | HR 42 | Ht 76.0 in | Wt 201.0 lb

## 2018-05-07 DIAGNOSIS — I4819 Other persistent atrial fibrillation: Secondary | ICD-10-CM

## 2018-05-07 DIAGNOSIS — R55 Syncope and collapse: Secondary | ICD-10-CM | POA: Diagnosis not present

## 2018-05-07 DIAGNOSIS — I482 Chronic atrial fibrillation, unspecified: Secondary | ICD-10-CM

## 2018-05-07 MED ORDER — RANITIDINE HCL 150 MG PO TABS: 150.0000 mg | ORAL_TABLET | Freq: Two times a day (BID) | ORAL | 3 refills | Status: DC

## 2018-05-07 NOTE — Patient Instructions (Signed)
Medication Instructions:  Continue medications If you need a refill on your cardiac medications before your next appointment, please call your pharmacy.   Lab work: None ordered   Testing/Procedures: None ordered   Follow-Up: At [facility], you and your health needs are our priority.  As part of our continuing mission to provide you with exceptional heart care, we have created designated Provider Care Teams.  These Care Teams include your primary Cardiologist (physician) and Advanced Practice Providers (APPs -  Physician Assistants and Nurse Practitioners) who all work together to provide you with the care you need, when you need it. Marland Kitchen Appointment with Dr.Camnitz Wed 05/20/18 at 11:30 am

## 2018-05-13 ENCOUNTER — Other Ambulatory Visit: Payer: Self-pay | Admitting: *Deleted

## 2018-05-13 DIAGNOSIS — D3131 Benign neoplasm of right choroid: Secondary | ICD-10-CM | POA: Diagnosis not present

## 2018-05-13 DIAGNOSIS — H401123 Primary open-angle glaucoma, left eye, severe stage: Secondary | ICD-10-CM | POA: Diagnosis not present

## 2018-05-13 DIAGNOSIS — Z961 Presence of intraocular lens: Secondary | ICD-10-CM | POA: Diagnosis not present

## 2018-05-13 DIAGNOSIS — H401111 Primary open-angle glaucoma, right eye, mild stage: Secondary | ICD-10-CM | POA: Diagnosis not present

## 2018-05-13 MED ORDER — LISINOPRIL 20 MG PO TABS: 20.0000 mg | ORAL_TABLET | Freq: Every day | ORAL | 3 refills | Status: DC

## 2018-05-13 MED ORDER — ATORVASTATIN CALCIUM 20 MG PO TABS: 20.0000 mg | ORAL_TABLET | Freq: Every day | ORAL | 3 refills | Status: DC

## 2018-05-13 MED ORDER — HYDROCHLOROTHIAZIDE 12.5 MG PO CAPS: 12.5000 mg | ORAL_CAPSULE | Freq: Every day | ORAL | 3 refills | Status: DC

## 2018-05-13 MED ORDER — RANITIDINE HCL 150 MG PO TABS: 150.0000 mg | ORAL_TABLET | Freq: Two times a day (BID) | ORAL | 3 refills | Status: DC

## 2018-05-13 MED ORDER — AMLODIPINE BESYLATE 5 MG PO TABS: 5.0000 mg | ORAL_TABLET | Freq: Every day | ORAL | 3 refills | Status: DC

## 2018-05-19 ENCOUNTER — Encounter: Payer: Self-pay | Admitting: Sports Medicine

## 2018-05-19 ENCOUNTER — Ambulatory Visit (INDEPENDENT_AMBULATORY_CARE_PROVIDER_SITE_OTHER): Payer: Medicare HMO | Admitting: Sports Medicine

## 2018-05-19 DIAGNOSIS — M1711 Unilateral primary osteoarthritis, right knee: Secondary | ICD-10-CM

## 2018-05-19 NOTE — Progress Notes (Signed)
Daniel Everett. [place], [place] [facility] [phone]  Daniel Everett - 85 y.o. male MRN [id number]  Date of birth: Aug 10, 1931  Visit Date: May 19, 2018  PCP: Daniel Post, MD   Referred by: Daniel Post, MD  SUBJECTIVE:  Chief Complaint  Patient presents with  . Right Knee - Follow-up    XR R knee 10/02/17. Corticosteroid injection 07/03/17.     HPI: 13-week follow-up status Everett Zilretta for his right knee.  He reports overall doing fairly well.  He unfortunately did have a fall about a month ago where he fell down 4-5 steps.  He was evaluated no evidence of significant injury at the time.  He reports feeling better from this.  He denies any locking or giving way of the knee.  Overall his knee is improved.  He denies any significant joint swelling but does have bilateral lower extremity swelling.  He is continue take Tylenol intermittently.  Occasionally performs at home therapeutic exercises.  REVIEW OF SYSTEMS: No significant nighttime awakenings due to this issue. Denies fevers, chills, recent weight gain or weight loss.  No night sweats.   HISTORY:  Prior history reviewed and updated per electronic medical record.  Patient Active Problem List   Diagnosis Date Noted  . Peripheral neuropathy 04/08/2018  . Gait abnormality 03/02/2018  . Primary osteoarthritis of right knee 06/25/2017    10/02/17 XR R knee IMPRESSION: Severe degenerative joint disease is noted medially. No acute abnormality seen in the right knee.  Corticosteroid injection 07/03/2017.    Marland Kitchen Essential tremor 06/25/2016  . Atrial fibrillation ([name]) 03/26/2016  . GERD (gastroesophageal reflux disease) 02/08/2015  . Nocturia 02/08/2015  . Preventative health care 05/04/2010  . GEN OSTEOARTHROSIS INVOLVING MULTIPLE SITES 01/03/2010    Qualifier: Diagnosis of  By: Arnoldo Morale MD, [place] 06/06/2009    Qualifier: Diagnosis of  By:  Arnoldo Morale MD, Janeann Forehand, CHRONIC 10/18/2008    Qualifier: Diagnosis of  By: Arnoldo Morale MD, [place] MAXILLARY SINUSITIS 08/24/2008    Qualifier: Diagnosis of  By: Arnoldo Morale MD, [place] DUE TO POLLEN 06/07/2008    Qualifier: Diagnosis of  By: Arnoldo Morale MD, Balinda Quails NEOPLASM, MALIGNANT, PROSTATE, HX OF, S/P TURP 05/28/2007    Qualifier: Diagnosis of  By: Arnoldo Morale MD, Balinda Quails    . Hypothyroidism 11/27/2006    Qualifier: Diagnosis of  By: Arnoldo Morale MD, [place], COLON 11/27/2006    Qualifier: Diagnosis of  By: Arnoldo Morale MD, Balinda Quails Osteoarthritis 11/27/2006    Qualifier: Diagnosis of  By: Arnoldo Morale MD, Balinda Quails    . Hyperlipidemia 10/28/2006    Qualifier: Diagnosis of  By: Jimmye Norman, LPN, Winfield Cunas    . Essential hypertension 10/28/2006    Qualifier: Diagnosis of  By: Jimmye Norman, LPN, Winfield Cunas    . History of colonic polyps 10/28/2006    Qualifier: Diagnosis of  By: Jimmye Norman LPN, Winfield Cunas     Social History   Occupational History  . Occupation: retired  Tobacco Use  . Smoking status: Never Smoker  . Smokeless tobacco: Never Used  Substance and Sexual Activity  . Alcohol use: No  . Drug use: No  . Sexual activity: Yes   Social History   Social History Narrative   Lives alone  Caffeine use: very little   Right handed       Spouse is in a nursing home with dementia   She was in 24/7 care at home for 3.5 year   Also has private attendant    She can't talk   3 children    2 in [place] and one in near [place] still goes to church; 'used to play sports   Basketball through college; Education administrator; now [employer] in 59     OBJECTIVE:  VS:  HT:6\' 4"  (193 cm)   WT:208 lb 3.2 oz (94.4 kg)  BMI:25.35    BP:90/64  HR:(!) 44bpm  TEMP: ( )  RESP:    PHYSICAL EXAM: Adult male. No acute distress.  Alert and appropriate. Right knee has generalized osteoarthritic bossing.   No significant effusion but mild to moderate synovitis.  Slight medial and lateral joint line pain but this is minimal.  Slightly positive patellar grind.  Extensor mechanism intact.  Range of motion from 3degrees to 125 degrees.   ASSESSMENT:  1. Primary osteoarthritis of right knee     PROCEDURES:  None  PLAN:  Pertinent additional documentation may be included in corresponding procedure notes, imaging studies, problem based documentation and patient instructions.  No problem-specific Assessment & Plan notes found for this encounter.   Overall he is doing quite well at this time.  We will hold off on any further injections but we will go and get him preapproved for repeat Zilretta injections.  He will plan to call us if any worsening symptoms for these injections at his convenience/need.  Continue previously prescribed home exercise program.   Activity modifications and the importance of avoiding exacerbating activities (limiting pain to no more than a 4 / 10 during or following activity) recommended and discussed.  Discussed red flag symptoms that warrant earlier emergent evaluation and patient voices understanding.   No orders of the defined types were placed in this encounter.  Lab Orders  No laboratory test(s) ordered today   Imaging Orders  No imaging studies ordered today   Referral Orders  No referral(s) requested today    Return if symptoms worsen or fail to improve.          Gerda Diss, [facility] Physician

## 2018-05-20 ENCOUNTER — Institutional Professional Consult (permissible substitution): Payer: Medicare HMO | Admitting: Cardiology

## 2018-05-20 ENCOUNTER — Encounter: Payer: Self-pay | Admitting: Cardiology

## 2018-05-20 ENCOUNTER — Ambulatory Visit: Payer: Medicare HMO | Admitting: Cardiology

## 2018-05-20 VITALS — BP 120/80 | HR 46 | Ht 76.0 in | Wt 198.0 lb

## 2018-05-20 DIAGNOSIS — I4821 Permanent atrial fibrillation: Secondary | ICD-10-CM

## 2018-05-20 NOTE — Patient Instructions (Signed)
Medication Instructions:  Your physician recommends that you continue on your current medications as directed. Please refer to the Current Medication list given to you today.  * If you need a refill on your cardiac medications before your next appointment, please call your pharmacy.   Labwork: None ordered  Testing/Procedures: Your physician has recommended that you have a pacemaker inserted. A pacemaker is a small device that is placed under the skin of your chest or abdomen to help control abnormal heart rhythms. This device uses electrical pulses to prompt the heart to beat at a normal rate. Pacemakers are used to treat heart rhythms that are too slow. Wire (leads) are attached to the pacemaker that goes into the chambers of you heart. This is done in the hospital and usually requires and overnight stay.   The nurse will call you next week to see if you would like to proceed with this procedure   Follow-Up: To be determined   Thank you for choosing [facility]!!   Trinidad Curet, RN [phone]  Any Other Special Instructions Will Be Listed Below (If Applicable).    Pacemaker Implantation, Adult Pacemaker implantation is a procedure to place a pacemaker inside your chest. A pacemaker is a small computer that sends electrical signals to the heart and helps your heart beat normally. A pacemaker also stores information about your heart rhythms. You may need pacemaker implantation if you:  Have a slow heartbeat (bradycardia).  Faint (syncope).  Have shortness of breath (dyspnea) due to heart problems. The pacemaker attaches to your heart through a wire, called a lead. Sometimes just one lead is needed. Other times, there will be two leads. There are two types of pacemakers:  Transvenous pacemaker. This type is placed under the skin or muscle of your chest. The lead goes through a vein in the chest area to reach the inside of the heart.  Epicardial pacemaker. This type is  placed under the skin or muscle of your chest or belly. The lead goes through your chest to the outside of the heart. Tell a health care provider about:  Any allergies you have.  All medicines you are taking, including vitamins, herbs, eye drops, creams, and over-the-counter medicines.  Any problems you or family members have had with anesthetic medicines.  Any blood or bone disorders you have.  Any surgeries you have had.  Any medical conditions you have.  Whether you are pregnant or may be pregnant. What are the risks? Generally, this is a safe procedure. However, problems may occur, including:  Infection.  Bleeding.  Failure of the pacemaker or the lead.  Collapse of a lung or bleeding into a lung.  Blood clot inside a blood vessel with a lead.  Damage to the heart.  Infection inside the heart (endocarditis).  Allergic reactions to medicines. What happens before the procedure? Staying hydrated Follow instructions from your health care provider about hydration, which may include:  Up to 2 hours before the procedure - you may continue to drink clear liquids, such as water, clear fruit juice, black coffee, and plain tea. Eating and drinking restrictions Follow instructions from your health care provider about eating and drinking, which may include:  8 hours before the procedure - stop eating heavy meals or foods such as meat, fried foods, or fatty foods.  6 hours before the procedure - stop eating light meals or foods, such as toast or cereal.  6 hours before the procedure - stop drinking milk or drinks  that contain milk.  2 hours before the procedure - stop drinking clear liquids. Medicines  Ask your health care provider about: ? Changing or stopping your regular medicines. This is especially important if you are taking diabetes medicines or blood thinners. ? Taking medicines such as aspirin and ibuprofen. These medicines can thin your blood. Do not take these  medicines before your procedure if your health care provider instructs you not to.  You may be given antibiotic medicine to help prevent infection. General instructions  You will have a heart evaluation. This may include an electrocardiogram (ECG), chest X-ray, and heart imaging (echocardiogram,  or echo) tests.  You will have blood tests.  Do not use any products that contain nicotine or tobacco, such as cigarettes and e-cigarettes. If you need help quitting, ask your health care provider.  Plan to have someone take you home from the hospital or clinic.  If you will be going home right after the procedure, plan to have someone with you for 24 hours.  Ask your health care provider how your surgical site will be marked or identified. What happens during the procedure?  To reduce your risk of infection: ? Your health care team will wash or sanitize their hands. ? Your skin will be washed with soap. ? Hair may be removed from the surgical area.  An IV tube will be inserted into one of your veins.  You will be given one or more of the following: ? A medicine to help you relax (sedative). ? A medicine to numb the area (local anesthetic). ? A medicine to make you fall asleep (general anesthetic).  If you are getting a transvenous pacemaker: ? An incision will be made in your upper chest. ? A pocket will be made for the pacemaker. It may be placed under the skin or between layers of muscle. ? The lead will be inserted into a blood vessel that returns to the heart. ? While X-rays are taken by an imaging machine (fluoroscopy), the lead will be advanced through the vein to the inside of your heart. ? The other end of the lead will be tunneled under the skin and attached to the pacemaker.  If you are getting an epicardial pacemaker: ? An incision will be made near your ribs or breastbone (sternum) for the lead. ? The lead will be attached to the outside of your heart. ? Another incision  will be made in your chest or upper belly to create a pocket for the pacemaker. ? The free end of the lead will be tunneled under the skin and attached to the pacemaker.  The transvenous or epicardial pacemaker will be tested. Imaging studies may be done to check the lead position.  The incisions will be closed with stitches (sutures), adhesive strips, or skin glue.  Bandages (dressing) will be placed over the incisions. The procedure may vary among health care providers and hospitals. What happens after the procedure?  Your blood pressure, heart rate, breathing rate, and blood oxygen level will be monitored until the medicines you were given have worn off.  You will be given antibiotics and pain medicine.  ECG and chest x-rays will be done.  You will wear a continuous type of ECG (Holter monitor) to check your heart rhythm.  Your health care provider will program the pacemaker.  Do not drive for 24 hours if you received a sedative. This information is not intended to replace advice given to you by your health care provider.  Make sure you discuss any questions you have with your health care provider. Document Released: 03/01/2002 Document Revised: 11/28/2017 Document Reviewed: 08/23/2015 Elsevier Interactive Patient Education  2019 [employer].

## 2018-05-20 NOTE — Progress Notes (Signed)
Electrophysiology Office Note   Date:  05/20/2018   ID:  Daniel Everett, DOB [birth year], MRN [id number]  PCP:  [name], MD  Cardiologist:  [place] Primary Electrophysiologist:  Constance Haw, MD    No chief complaint on file.    History of Present Illness: Daniel Everett is a 85 y.o. male who is being seen today for the evaluation of atrial fibrillation at the request of Burchette, Alinda Sierras, MD. Presenting today for electrophysiology evaluation.  He has a history of atrial fibrillation and hypertension.  He wore a cardiac monitor in 2018 that showed heart rates in the 20s with up to 3-second pauses.  Bradycardia occurred mainly during sleeping hours.  He had a fall in January down the stairs.  He says that he did not have syncope at that time.  He has had a slow ventricular response with his atrial fibrillation in the past.  He is on no rate controlling medications.  He does have ongoing shortness of breath and fatigue.  He feels that this may be due to simply his a   Today, he denies symptoms of palpitations, chest pain,  orthopnea, PND, lower extremity edema, claudication, dizziness, presyncope, syncope, bleeding, or neurologic sequela. The patient is tolerating medications without difficulties.    Past Medical History:  Diagnosis Date  . Allergic rhinitis due to pollen 06/07/2008  . Atrial fibrillation ([place])   . Benign essential HTN   . COLONIC POLYPS, HX OF 10/28/2006  . DIVERTICULOSIS, COLON 11/27/2006  . Gait abnormality 03/02/2018  . GEN OSTEOARTHROSIS INVOLVING MULTIPLE SITES 01/03/2010  . HYPERGLYCEMIA 10/18/2008  . HYPOTHYROIDISM 11/27/2006  . NEOPLASM, MALIGNANT, PROSTATE, HX OF, S/P TURP 05/28/2007  . OSTEOARTHRITIS 11/27/2006  . Peripheral neuropathy 04/08/2018  . WEIGHT LOSS, RECENT 11/27/2006   Past Surgical History:  Procedure Laterality Date  . arthroscopy rt knee    . HYDROCELE EXCISION / REPAIR    . PROSTATE SURGERY     TURP  . ROTATOR CUFF REPAIR        Current Outpatient Medications  Medication Sig Dispense Refill  . ACETAMINOPHEN PO Take 650 mg by mouth 2 (two) times daily. Take one tablet twice a day.     Marland Kitchen amLODipine (NORVASC) 5 MG tablet Take 1 tablet (5 mg total) by mouth daily. 90 tablet 3  . apixaban (ELIQUIS) 5 MG TABS tablet Take 1 tablet (5 mg total) by mouth 2 (two) times daily. 180 tablet 3  . atorvastatin (LIPITOR) 20 MG tablet Take 1 tablet (20 mg total) by mouth daily. 90 tablet 3  . finasteride (PROSCAR) 5 MG tablet TAKE 1 TABLET DAILY 90 tablet 2  . fluorouracil (EFUDEX) 5 % cream Apply topically 2 (two) times daily.    . hydrochlorothiazide (MICROZIDE) 12.5 MG capsule Take 1 capsule (12.5 mg total) by mouth daily. 90 capsule 3  . latanoprost (XALATAN) 0.005 % ophthalmic solution Place 1 drop into both eyes at bedtime.     Marland Kitchen levothyroxine (SYNTHROID, LEVOTHROID) 75 MCG tablet TAKE 1 TABLET DAILY 90 tablet 2  . lisinopril (PRINIVIL,ZESTRIL) 20 MG tablet Take 1 tablet (20 mg total) by mouth daily. 90 tablet 3  . Multiple Vitamin (MULTIVITAMIN) tablet Take 1 tablet by mouth daily.      . Pyridoxine HCl (VITAMIN B-6) 500 MG tablet Take 500 mg by mouth daily.    . ranitidine (ZANTAC) 150 MG tablet Take 1 tablet (150 mg total) by mouth 2 (two) times daily. 180 tablet 3  .  timolol (TIMOPTIC) 0.5 % ophthalmic solution Place 1 drop into both eyes 2 (two) times daily.      . traMADol (ULTRAM) 50 MG tablet Take 1 every 6 hours for pain not helped by Tylenol alone 15 tablet 0   No current facility-administered medications for this visit.     Allergies:   Patient has no known allergies.   Social History:  The patient  reports that he has never smoked. He has never used smokeless tobacco. He reports that he does not drink alcohol or use drugs.   Family History:  The patient's family history includes Cancer in his father; Coronary artery disease in his mother; Heart disease in his mother; Pancreatic cancer in his father; Tremor  in his sister.    ROS:  Please see the history of present illness.   Otherwise, review of systems is positive for leg swelling, palpitations, balance problems, easy bruising.   All other systems are reviewed and negative.    PHYSICAL EXAM: VS:  BP 120/80   Pulse (!) 46   Ht 6\' 4"  (1.93 m)   Wt 198 lb (89.8 kg)   BMI 24.10 kg/m  , BMI Body mass index is 24.1 kg/m. GEN: Well nourished, well developed, in no acute distress  HEENT: normal  Neck: no JVD, carotid bruits, or masses Cardiac: iRRR; no murmurs, rubs, or gallops,no edema  Respiratory:  clear to auscultation bilaterally, normal work of breathing GI: soft, nontender, nondistended, + BS MS: no deformity or atrophy  Skin: warm and dry Neuro:  Strength and sensation are intact Psych: euthymic mood, full affect  EKG:  EKG is not ordered today. Personal review of the ekg ordered 04/28/18 shows atrial fibrillation, rate 42, right superior axis, incomplete right bundle branch block  Recent Labs: 09/26/2017: BUN 11; Creatinine, Ser 0.88; Hemoglobin 15.2; Platelets 183.0; Potassium 4.3; Sodium 136; TSH 2.17    Lipid Panel     Component Value Date/Time   CHOL 134 12/25/2016 0827   TRIG 52.0 12/25/2016 0827   HDL 44.90 12/25/2016 0827   CHOLHDL 3 12/25/2016 0827   VLDL 10.4 12/25/2016 0827   LDLCALC 78 12/25/2016 0827     Wt Readings from Last 3 Encounters:  05/20/18 198 lb (89.8 kg)  05/19/18 208 lb 3.2 oz (94.4 kg)  05/07/18 201 lb (91.2 kg)      Other studies Reviewed: Additional studies/ records that were reviewed today include: TTE 04/08/16  Review of the above records today demonstrates:  - Left ventricle: The cavity size was normal. Wall thickness was   normal. Systolic function was normal. The estimated ejection   fraction was in the range of 55% to 60%. Wall motion was normal;   there were no regional wall motion abnormalities. The study is   not technically sufficient to allow evaluation of LV diastolic    function. - Aortic valve: There was trivial regurgitation. - Mitral valve: There was mild regurgitation. - Left atrium: The atrium was severely dilated. - Right atrium: The atrium was severely dilated. - Atrial septum: No defect or patent foramen ovale was identified. - Tricuspid valve: There was moderate regurgitation.  Holter 04/04/16 - personally reviewed  Atrial fibrillation with slow ventricular response  Rare PVCs and PVC couplets  Longest pause 3.2 seconds  ASSESSMENT AND PLAN:  1.  Atrial fibrillation with slow ventricular response: Heart rate of 42 on most recent ECG.   Currently anticoagulated with Eliquis.  He does have weakness and fatigue as well as shortness  of breath.  Is unclear to me whether or not this is due to atrial fibrillation or if it is due to his slow ventricular response.  His atrial fibrillation does appear to be permanent at this point.  He has been in atrial fibrillation since at least 2017.  Due to his so ventricular response, I do think that he would benefit from pacemaker implantation which Tenishia Ekman help with his potentially fatigue and shortness of breath.  Risks and benefits were discussed including bleeding, tamponade, infection, pneumothorax.  He understands these risks, but would like to consider his options further.  We Marsela Kuan call him back in 1 week.  This patients CHA2DS2-VASc Score and unadjusted Ischemic Stroke Rate (% per year) is equal to 3.2 % stroke rate/year from a score of 3  Above score calculated as 1 point each if present [CHF, HTN, DM, Vascular=MI/PAD/Aortic Plaque, Age if 65-74, or Male] Above score calculated as 2 points each if present [Age > 75, or Stroke/TIA/TE]  2.  Hypertension: Well-controlled today.  Case discussed with primary cardiology  Current medicines are reviewed at length with the patient today.   The patient does not have concerns regarding his medicines.  The following changes were made today:  none  Labs/ tests  ordered today include:  No orders of the defined types were placed in this encounter.    Disposition:   FU with Magdalyn Arenivas 3 months  Signed, Evania Lyne Meredith Leeds, MD  05/20/2018 11:58 AM     [facility] [address] [place] [address] [phone] (office) [phone] (fax)

## 2018-05-27 ENCOUNTER — Telehealth: Payer: Self-pay | Admitting: *Deleted

## 2018-05-27 NOTE — Telephone Encounter (Signed)
lmtcb in reference to our discussion last week about possible PPM implant.

## 2018-05-29 ENCOUNTER — Other Ambulatory Visit: Payer: Self-pay | Admitting: Cardiology

## 2018-05-29 NOTE — Telephone Encounter (Signed)
New Message   Patient returning [place] phone call

## 2018-05-29 NOTE — Telephone Encounter (Signed)
Informed that if he did PPM implant on 4/03 then he could return to driving on 5/95, per [place]. Pt would like to proceed w/ implant Aware I will schedule next week and call him next week/week after to go over instructions. Pt agreeable to plan.

## 2018-05-29 NOTE — Telephone Encounter (Signed)
Follow up  Pt is calling to speak with [facility]

## 2018-06-02 DIAGNOSIS — C44319 Basal cell carcinoma of skin of other parts of face: Secondary | ICD-10-CM | POA: Diagnosis not present

## 2018-06-02 NOTE — Telephone Encounter (Signed)
Called to go over instructions. Pt asked me to call back next week. He understands I will call next week.

## 2018-06-09 ENCOUNTER — Institutional Professional Consult (permissible substitution): Payer: Medicare HMO | Admitting: Cardiology

## 2018-06-12 NOTE — Telephone Encounter (Signed)
Followed up with patient who reports symptoms (weakness/fatigue) is the same since we saw him 2/26.  States it hasn't worsened. Pt aware that since he is stable and this is not "urgent" we will postpone procedure d/t COVID-19 concerns. Pt is agreeable. Pt will call the office if symptoms worsen. He is aware I will call him at a later date to reschedule.

## 2018-06-26 ENCOUNTER — Ambulatory Visit (HOSPITAL_COMMUNITY): Admit: 2018-06-26 | Payer: Medicare HMO | Admitting: Cardiology

## 2018-06-26 ENCOUNTER — Encounter (HOSPITAL_COMMUNITY): Payer: Self-pay

## 2018-06-26 SURGERY — PACEMAKER IMPLANT

## 2018-07-02 ENCOUNTER — Telehealth: Payer: Self-pay

## 2018-07-02 MED ORDER — HYDROCHLOROTHIAZIDE 12.5 MG PO CAPS: 12.5000 mg | ORAL_CAPSULE | Freq: Every day | ORAL | 3 refills | Status: DC

## 2018-07-02 NOTE — Telephone Encounter (Signed)
rx for hctz 12.5 mg tabs sent

## 2018-07-07 ENCOUNTER — Telehealth: Payer: Self-pay | Admitting: Cardiology

## 2018-07-07 MED ORDER — HYDROCHLOROTHIAZIDE 12.5 MG PO CAPS: 12.5000 mg | ORAL_CAPSULE | Freq: Every day | ORAL | 3 refills | Status: DC

## 2018-07-07 NOTE — Telephone Encounter (Signed)
° ° °  Pt c/o medication issue:  1. Name of Medication:  hydrochlorothiazide (MICROZIDE) 12.5 MG capsule 2. How are you currently taking this medication (dosage and times per day)? As written  3. Are you having a reaction (difficulty breathing--STAT)? no 4. What is your medication issue? Per Barth Kirks at [facility] capsule on backorder, please confirm ok to take tablet form. Please call [phone], ref # [id number]

## 2018-07-07 NOTE — Telephone Encounter (Signed)
Ok to renew. HCTZ  Daniel Makepeace [place] MD, [facility]

## 2018-07-07 NOTE — Telephone Encounter (Signed)
90 day refill sent to pharmacy

## 2018-07-15 ENCOUNTER — Ambulatory Visit: Payer: Medicare HMO

## 2018-07-16 ENCOUNTER — Other Ambulatory Visit: Payer: Self-pay

## 2018-07-16 ENCOUNTER — Encounter: Payer: Self-pay | Admitting: Sports Medicine

## 2018-07-16 ENCOUNTER — Ambulatory Visit (INDEPENDENT_AMBULATORY_CARE_PROVIDER_SITE_OTHER): Payer: Medicare HMO | Admitting: Sports Medicine

## 2018-07-16 ENCOUNTER — Ambulatory Visit: Payer: Self-pay

## 2018-07-16 VITALS — BP 110/82 | HR 37 | Temp 97.4°F | Ht 76.0 in | Wt 199.8 lb

## 2018-07-16 DIAGNOSIS — M1711 Unilateral primary osteoarthritis, right knee: Secondary | ICD-10-CM

## 2018-07-16 DIAGNOSIS — M25461 Effusion, right knee: Secondary | ICD-10-CM | POA: Diagnosis not present

## 2018-07-16 NOTE — Patient Instructions (Addendum)

## 2018-07-16 NOTE — Procedures (Signed)
PROCEDURE NOTE:  Ultrasound Guided: Aspiration and Injection: Right knee, Zilretta Injection Images were obtained and interpreted by myself, Teresa Coombs, DO  Images have been saved and stored to PACS system. Images obtained on: GE S7 Ultrasound machine    ULTRASOUND FINDINGS:  Large effusion  DESCRIPTION OF PROCEDURE:  The patient's clinical condition is marked by substantial pain and/or significant functional disability. Other conservative therapy has not provided relief, is contraindicated, or not appropriate. There is a reasonable likelihood that injection will significantly improve the patient's pain and/or functional impairment.   After discussing the risks, benefits and expected outcomes of the injection and all questions were reviewed and answered, the patient wished to undergo the above named procedure.  Verbal consent was obtained.  The ultrasound was used to identify the target structure and adjacent neurovascular structures. The skin was then prepped in sterile fashion and the target structure was injected under direct visualization using sterile technique as below:  Single injection performed as below: PREP: Alcohol, Ethel Chloride and 3 cc 1% lidocaine on 25g 1.5 in. needle APPROACH:superiolateral, stopcock technique, 21g 2 in. INJECTATE: 5cc Zilretta (32mg /64mL Sustained Release Triamcinolone) ASPIRATE: 46mL  and clear and straw colored  DRESSING: Band-Aid  Post procedural instructions including recommending icing and warning signs for infection were reviewed.    This procedure was well tolerated and there were no complications.   IMPRESSION: Succesful Ultrasound Guided: Aspiration and Injection

## 2018-07-16 NOTE — Progress Notes (Signed)
Daniel Everett. [place], [place] at [facility] [phone]  Daniel Everett - 85 y.o. male MRN [id number]  Date of birth: April 13, 1931  Visit Date: July 16, 2018  PCP: [name], MD   Referred by: [name], MD  SUBJECTIVE:   Chief Complaint  Patient presents with  . Right Knee - Follow-up    HPI: Knee symptoms have once again worsened over the past several weeks.  He is having grinding and swelling.  He did well with the last Zilretta injection and and is requesting this to be repeated again today.  Using Tylenol intermittently.  Tramadol as needed.  REVIEW OF SYSTEMS: No significant nighttime awakenings due to this issue. Denies fevers, chills, recent weight gain or weight loss.  No night sweats.  Pt denies any change in bowel or bladder habits, muscle weakness, numbness or falls associated with this pain.  HISTORY:  Prior history reviewed and updated per electronic medical record.  Patient Active Problem List   Diagnosis Date Noted  . Peripheral neuropathy 04/08/2018  . Gait abnormality 03/02/2018  . Primary osteoarthritis of right knee 06/25/2017    10/02/17 XR R knee IMPRESSION: Severe degenerative joint disease is noted medially. No acute abnormality seen in the right knee.  Corticosteroid injection 07/03/2017.    Marland Kitchen Essential tremor 06/25/2016  . Atrial fibrillation ([place]) 03/26/2016  . GERD (gastroesophageal reflux disease) 02/08/2015  . Nocturia 02/08/2015  . Preventative health care 05/04/2010  . GEN OSTEOARTHROSIS INVOLVING MULTIPLE SITES 01/03/2010    Qualifier: Diagnosis of  By: Arnoldo Morale MD, [place] 06/06/2009    Qualifier: Diagnosis of  By: Arnoldo Morale MD, Janeann Forehand, CHRONIC 10/18/2008    Qualifier: Diagnosis of  By: Arnoldo Morale MD, [place] MAXILLARY SINUSITIS 08/24/2008    Qualifier: Diagnosis of  By: Arnoldo Morale MD, [place] DUE TO  POLLEN 06/07/2008    Qualifier: Diagnosis of  By: Arnoldo Morale MD, Balinda Quails NEOPLASM, MALIGNANT, PROSTATE, HX OF, S/P TURP 05/28/2007    Qualifier: Diagnosis of  By: Arnoldo Morale MD, Balinda Quails    . Hypothyroidism 11/27/2006    Qualifier: Diagnosis of  By: Arnoldo Morale MD, [place], COLON 11/27/2006    Qualifier: Diagnosis of  By: Arnoldo Morale MD, Balinda Quails Osteoarthritis 11/27/2006    Qualifier: Diagnosis of  By: Arnoldo Morale MD, Balinda Quails    . Hyperlipidemia 10/28/2006    Qualifier: Diagnosis of  By: Jimmye Norman, LPN, [place] Cunas    . Essential hypertension 10/28/2006    Qualifier: Diagnosis of  By: Jimmye Norman, LPN, [place] Cunas    . History of colonic polyps 10/28/2006    Qualifier: Diagnosis of  By: Jimmye Norman LPN, [place] Cunas     Social History   Occupational History  . Occupation: retired  Tobacco Use  . Smoking status: Never Smoker  . Smokeless tobacco: Never Used  Substance and Sexual Activity  . Alcohol use: No  . Drug use: No  . Sexual activity: Yes   Social History   Social History Narrative   Lives alone   Caffeine use: very little   Right handed       Spouse is in a nursing home with dementia   She was in 24/7 care at home for 3.5 year   Also has private attendant  She can't talk   3 children    2 in [place] and one in near [place] still goes to church; 'used to play sports   Basketball through college; Education administrator; now [employer] in 59      OBJECTIVE:  VS:  HT:6\' 4"  (193 cm)   WT:199 lb 12.8 oz (90.6 kg)  BMI:24.33    BP:110/82  HR:(!) 37bpm  TEMP:(!) 97.4 F (36.3 C)(Oral)  RESP:95 %   PHYSICAL EXAM: Adult male. No acute distress.  Alert and appropriate. Right knee has moderate effusion.  Poor muscle definition.  Extensor mechanism strength intact.   ASSESSMENT:   1. Primary osteoarthritis of right knee   2. Effusion of right knee     PROCEDURES:  US Guided Injection per procedure note      PLAN:   Pertinent additional documentation may be included in corresponding procedure notes, imaging studies, problem based documentation and patient instructions.  No problem-specific Assessment & Plan notes found for this encounter.   Repeat Zilretta injection performed today.  Home Therapeutic Exercises: Continue previously prescribed home exercise program   Activity modifications and the importance of avoiding exacerbating activities (limiting pain to no more than a 4 / 10 during or following activity) recommended and discussed.   Discussed red flag symptoms that warrant earlier emergent evaluation and patient voices understanding.    No orders of the defined types were placed in this encounter.  Lab Orders  No laboratory test(s) ordered today   Imaging Orders     [place] [facility] POCT ULTRASOUND Referral Orders  No referral(s) requested today    Return in about 12 weeks (around 10/08/2018).  Can consider repeat Zilretta injections at that time.         Gerda Diss, [place] [facility] Physician

## 2018-07-20 ENCOUNTER — Ambulatory Visit: Payer: Medicare HMO | Admitting: Adult Health

## 2018-07-20 ENCOUNTER — Telehealth: Payer: Self-pay | Admitting: Cardiology

## 2018-07-20 MED ORDER — AMLODIPINE BESYLATE 5 MG PO TABS: 5.0000 mg | ORAL_TABLET | Freq: Every day | ORAL | 3 refills | Status: DC

## 2018-07-20 MED ORDER — HYDROCHLOROTHIAZIDE 12.5 MG PO CAPS: 12.5000 mg | ORAL_CAPSULE | Freq: Every day | ORAL | 3 refills | Status: DC

## 2018-07-20 NOTE — Telephone Encounter (Signed)
Spoke to [facility]vised [facility] [address].Advised to call back if they are on short supply.[facility]

## 2018-07-20 NOTE — Telephone Encounter (Signed)
New message  Pt c/o medication issue:  1. Name of Medication: hydrochlorothiazide (MICROZIDE) 12.5 MG capsule     2. How are you currently taking this medication (dosage and times per day)? 1 time daily  3. Are you having a reaction (difficulty breathing--STAT)?n/a  4. What is your medication issue? Patient states that [facility] can't get this medication they need a substitute.      Pt c/o medication issue:  1. Name of Medication: amLODipine (NORVASC) 5 MG tablet  2. How are you currently taking this medication (dosage and times per day)? 1 time daily  3. Are you having a reaction (difficulty breathing--STAT)? n/a  4. What is your medication issue? Patient states that [facility] states that need a 90- day prescription. The patient only received a 30 day supply the last time this medication was filled.

## 2018-07-21 ENCOUNTER — Encounter: Payer: Self-pay | Admitting: Sports Medicine

## 2018-07-28 ENCOUNTER — Telehealth: Payer: Self-pay | Admitting: Pharmacist Clinician (PhC)/ Clinical Pharmacy Specialist

## 2018-07-28 MED ORDER — OMEPRAZOLE 20 MG PO CPDR: 20.0000 mg | DELAYED_RELEASE_CAPSULE | Freq: Every day | ORAL | 3 refills | Status: DC

## 2018-07-28 NOTE — Telephone Encounter (Signed)
Patient returned call.  States he has been on ranitidine 150 mg bid for many years.  No complaints of heartburn or acid reflux.  Not sure why he has continued this, suspect that nobody told him he didn't need it.  Would not recommend d/c completely, as he could develop some rebound reflux.    Will switch him to omeprazole 20 mg once daily.  He will call back in 2-3 weeks to let us know how he is doing, then would like his rx sent to mail order.

## 2018-07-28 NOTE — Telephone Encounter (Signed)
Patient brought in letter from insurance, stating his ranitidine would no longer be available.  Patient asking about alternatives.   LMOM for patient to return call.  H2 blockers may not be readily available, he might be better off with omeprazole or pantoprazole.

## 2018-08-12 ENCOUNTER — Telehealth: Payer: Self-pay | Admitting: Cardiology

## 2018-08-12 NOTE — Telephone Encounter (Signed)
New message   Patient states that there was a discussion about getting a pacemaker in February. Patient would like a call for further discussion about getting a pacemaker.

## 2018-08-12 NOTE — Telephone Encounter (Signed)
Pt reports that his Pacer implant has been on hold due to [place] but he wishes to go ahead and proceed if possible.. he says it is been in his mind and it is a good time for him to have it done.. however, he says he understands if Dr. Curt Bears still prefers to wait longer prior to scheduling. I will  forward to Sherri/ Dr. Curt Bears for review.           COVID-19 Pre-Screening Questions:  . In the past 7 to 10 days have you had a cough,  shortness of breath, headache, congestion, fever, body aches, chills, sore throat, or sudden loss of taste or sense of smell? NO . Have you been around anyone with known Covid 19. NO . Have you been around anyone who is awaiting Covid 19 test results in the past 7 to 10 days? NO . Have you been around anyone who has been exposed to Covid 19, or has mentioned symptoms of Covid 19 within the past 7 to 10 days?NO   If you have any concerns/questions about symptoms patients report during screening (either on the phone or at threshold). Contact the provider seeing the patient or DOD for further guidance.  If neither are available contact a member of the leadership team.

## 2018-08-14 ENCOUNTER — Encounter: Payer: Self-pay | Admitting: *Deleted

## 2018-08-14 NOTE — Telephone Encounter (Signed)
PPM implant scheduled for 5/28, procedure instructions reviewed.  Pt will stop by and pick up letter of instructions Tuesday morning prior to [place] screening. COVID-19 screening scheduled 5/26 and instructions reviewed with pt. Aware office will call to schedule wound check. Patient verbalized understanding and agreeable to plan.       COVID-19 Pre-Screening Questions:  . In the past 7 to 10 days have you had a cough,  shortness of breath, headache, congestion, fever (100 or greater) body aches, chills, sore throat, or sudden loss of taste or sense of smell?  NO . Have you been around anyone with known Covid 19.  NO . Have you been around anyone who is awaiting Covid 19 test results in the past 7 to 10 days? NO . Have you been around anyone who has been exposed to Covid 19, or has mentioned symptoms of Covid 19 within the past 7 to 10 days? NO

## 2018-08-18 ENCOUNTER — Other Ambulatory Visit (HOSPITAL_COMMUNITY)
Admission: RE | Admit: 2018-08-18 | Discharge: 2018-08-18 | Disposition: A | Discharge status: Home / Self Care | Payer: Medicare HMO | Source: Ambulatory Visit | Attending: Cardiology | Admitting: Cardiology

## 2018-08-18 ENCOUNTER — Encounter: Payer: Self-pay | Admitting: *Deleted

## 2018-08-18 DIAGNOSIS — Z1159 Encounter for screening for other viral diseases: Secondary | ICD-10-CM | POA: Insufficient documentation

## 2018-08-18 LAB — SARS CORONAVIRUS 2 BY RT PCR (HOSPITAL ORDER, PERFORMED IN ~~LOC~~ HOSPITAL LAB): SARS Coronavirus 2: NEGATIVE

## 2018-08-18 NOTE — Progress Notes (Unsigned)
Instruction letter printed and handed to patient in lobby.

## 2018-08-20 ENCOUNTER — Encounter (HOSPITAL_COMMUNITY): Payer: Self-pay | Admitting: General Practice

## 2018-08-20 ENCOUNTER — Ambulatory Visit (HOSPITAL_COMMUNITY)
Admission: RE | Disposition: A | Discharge status: Home / Self Care | Payer: Self-pay | Source: Home / Self Care | Attending: Cardiology

## 2018-08-20 ENCOUNTER — Ambulatory Visit (HOSPITAL_COMMUNITY)
Admission: RE | Admit: 2018-08-20 | Discharge: 2018-08-21 | Disposition: A | Discharge status: Home / Self Care | Payer: Medicare HMO | Attending: Cardiology | Admitting: Cardiology

## 2018-08-20 ENCOUNTER — Other Ambulatory Visit: Payer: Self-pay

## 2018-08-20 DIAGNOSIS — I4821 Permanent atrial fibrillation: Secondary | ICD-10-CM | POA: Insufficient documentation

## 2018-08-20 DIAGNOSIS — M1990 Unspecified osteoarthritis, unspecified site: Secondary | ICD-10-CM | POA: Insufficient documentation

## 2018-08-20 DIAGNOSIS — I4891 Unspecified atrial fibrillation: Secondary | ICD-10-CM | POA: Diagnosis not present

## 2018-08-20 DIAGNOSIS — I4510 Unspecified right bundle-branch block: Secondary | ICD-10-CM | POA: Insufficient documentation

## 2018-08-20 DIAGNOSIS — Z8249 Family history of ischemic heart disease and other diseases of the circulatory system: Secondary | ICD-10-CM | POA: Insufficient documentation

## 2018-08-20 DIAGNOSIS — Z79890 Hormone replacement therapy (postmenopausal): Secondary | ICD-10-CM | POA: Diagnosis not present

## 2018-08-20 DIAGNOSIS — Z7901 Long term (current) use of anticoagulants: Secondary | ICD-10-CM | POA: Insufficient documentation

## 2018-08-20 DIAGNOSIS — E039 Hypothyroidism, unspecified: Secondary | ICD-10-CM | POA: Diagnosis not present

## 2018-08-20 DIAGNOSIS — Z95818 Presence of other cardiac implants and grafts: Secondary | ICD-10-CM

## 2018-08-20 DIAGNOSIS — Z950 Presence of cardiac pacemaker: Secondary | ICD-10-CM

## 2018-08-20 DIAGNOSIS — Z79899 Other long term (current) drug therapy: Secondary | ICD-10-CM | POA: Insufficient documentation

## 2018-08-20 DIAGNOSIS — Z8546 Personal history of malignant neoplasm of prostate: Secondary | ICD-10-CM | POA: Diagnosis not present

## 2018-08-20 DIAGNOSIS — E1140 Type 2 diabetes mellitus with diabetic neuropathy, unspecified: Secondary | ICD-10-CM | POA: Insufficient documentation

## 2018-08-20 DIAGNOSIS — R001 Bradycardia, unspecified: Secondary | ICD-10-CM | POA: Diagnosis not present

## 2018-08-20 DIAGNOSIS — I10 Essential (primary) hypertension: Secondary | ICD-10-CM | POA: Insufficient documentation

## 2018-08-20 HISTORY — DX: Presence of cardiac pacemaker: Z95.0

## 2018-08-20 HISTORY — DX: Permanent atrial fibrillation: I48.21

## 2018-08-20 HISTORY — PX: INSERT / REPLACE / REMOVE PACEMAKER: SUR710

## 2018-08-20 HISTORY — PX: PACEMAKER IMPLANT: EP1218

## 2018-08-20 LAB — CBC
HCT: 43.3 % (ref 39.0–52.0)
Hemoglobin: 14.9 g/dL (ref 13.0–17.0)
MCH: 32.5 pg (ref 26.0–34.0)
MCHC: 34.4 g/dL (ref 30.0–36.0)
MCV: 94.5 fL (ref 80.0–100.0)
Platelets: 161 10*3/uL (ref 150–400)
RBC: 4.58 MIL/uL (ref 4.22–5.81)
RDW: 13.2 % (ref 11.5–15.5)
WBC: 5.2 10*3/uL (ref 4.0–10.5)
nRBC: 0 % (ref 0.0–0.2)

## 2018-08-20 LAB — SURGICAL PCR SCREEN
MRSA, PCR: NEGATIVE
Staphylococcus aureus: NEGATIVE

## 2018-08-20 LAB — POCT I-STAT, CHEM 8
BUN: 11 mg/dL (ref 8–23)
Calcium, Ion: 1.07 mmol/L — ABNORMAL LOW (ref 1.15–1.40)
Chloride: 100 mmol/L (ref 98–111)
Creatinine, Ser: 0.7 mg/dL (ref 0.61–1.24)
Glucose, Bld: 106 mg/dL — ABNORMAL HIGH (ref 70–99)
HCT: 44 % (ref 39.0–52.0)
Hemoglobin: 15 g/dL (ref 13.0–17.0)
Potassium: 3.3 mmol/L — ABNORMAL LOW (ref 3.5–5.1)
Sodium: 135 mmol/L (ref 135–145)
TCO2: 25 mmol/L (ref 22–32)

## 2018-08-20 SURGERY — PACEMAKER IMPLANT

## 2018-08-20 MED ORDER — CEFAZOLIN SODIUM-DEXTROSE 2-4 GM/100ML-% IV SOLN
2.0000 g | INTRAVENOUS | Status: AC
  Administered 2018-08-20: 2 g via INTRAVENOUS
  Filled 2018-08-20: qty 100

## 2018-08-20 MED ORDER — LATANOPROST 0.005 % OP SOLN
1.0000 [drp] | Freq: Every day | OPHTHALMIC | Status: DC
  Administered 2018-08-20: 1 [drp] via OPHTHALMIC
  Filled 2018-08-20: qty 2.5

## 2018-08-20 MED ORDER — TIMOLOL MALEATE 0.5 % OP SOLN
1.0000 [drp] | Freq: Two times a day (BID) | OPHTHALMIC | Status: DC
  Administered 2018-08-20: 1 [drp] via OPHTHALMIC
  Filled 2018-08-20: qty 5

## 2018-08-20 MED ORDER — LISINOPRIL 10 MG PO TABS
20.0000 mg | ORAL_TABLET | Freq: Every day | ORAL | Status: DC
  Administered 2018-08-20: 20 mg via ORAL
  Filled 2018-08-20 (×2): qty 2

## 2018-08-20 MED ORDER — CEFAZOLIN SODIUM-DEXTROSE 1-4 GM/50ML-% IV SOLN
1.0000 g | Freq: Four times a day (QID) | INTRAVENOUS | Status: AC
  Administered 2018-08-20 – 2018-08-21 (×3): 1 g via INTRAVENOUS
  Filled 2018-08-20 (×3): qty 50

## 2018-08-20 MED ORDER — CHLORHEXIDINE GLUCONATE 4 % EX LIQD
60.0000 mL | Freq: Once | CUTANEOUS | Status: DC
  Filled 2018-08-20: qty 60

## 2018-08-20 MED ORDER — SODIUM CHLORIDE 0.9 % IV SOLN
INTRAVENOUS | Status: AC
  Filled 2018-08-20: qty 2

## 2018-08-20 MED ORDER — HEPARIN (PORCINE) IN NACL 1000-0.9 UT/500ML-% IV SOLN
INTRAVENOUS | Status: DC | PRN
  Administered 2018-08-20: 500 mL

## 2018-08-20 MED ORDER — LIDOCAINE HCL 1 % IJ SOLN
INTRAMUSCULAR | Status: AC
  Filled 2018-08-20: qty 60

## 2018-08-20 MED ORDER — LIDOCAINE HCL (PF) 1 % IJ SOLN
INTRAMUSCULAR | Status: DC | PRN
  Administered 2018-08-20: 60 mL

## 2018-08-20 MED ORDER — ADULT MULTIVITAMIN W/MINERALS CH
1.0000 | ORAL_TABLET | Freq: Every day | ORAL | Status: DC
  Administered 2018-08-20: 1 via ORAL
  Filled 2018-08-20 (×2): qty 1

## 2018-08-20 MED ORDER — ACETAMINOPHEN 325 MG PO TABS: 325.0000 mg | ORAL_TABLET | ORAL | Status: DC | PRN

## 2018-08-20 MED ORDER — FENTANYL CITRATE (PF) 100 MCG/2ML IJ SOLN
INTRAMUSCULAR | Status: DC | PRN
  Administered 2018-08-20: 12.5 ug via INTRAVENOUS

## 2018-08-20 MED ORDER — VITAMIN B-6 100 MG PO TABS
500.0000 mg | ORAL_TABLET | Freq: Every day | ORAL | Status: DC
  Administered 2018-08-20: 500 mg via ORAL
  Filled 2018-08-20 (×2): qty 5

## 2018-08-20 MED ORDER — FINASTERIDE 5 MG PO TABS
5.0000 mg | ORAL_TABLET | Freq: Every day | ORAL | Status: DC
  Administered 2018-08-20: 5 mg via ORAL
  Filled 2018-08-20 (×2): qty 1

## 2018-08-20 MED ORDER — MUPIROCIN 2 % EX OINT
1.0000 "application " | TOPICAL_OINTMENT | Freq: Once | CUTANEOUS | Status: AC
  Administered 2018-08-20: 1 via TOPICAL
  Filled 2018-08-20 (×2): qty 22

## 2018-08-20 MED ORDER — ATORVASTATIN CALCIUM 10 MG PO TABS
20.0000 mg | ORAL_TABLET | Freq: Every day | ORAL | Status: DC
  Administered 2018-08-20: 20 mg via ORAL
  Filled 2018-08-20 (×2): qty 2

## 2018-08-20 MED ORDER — SODIUM CHLORIDE 0.9 % IV SOLN
80.0000 mg | INTRAVENOUS | Status: AC
  Administered 2018-08-20: 80 mg
  Filled 2018-08-20: qty 2

## 2018-08-20 MED ORDER — PANTOPRAZOLE SODIUM 40 MG PO TBEC
40.0000 mg | DELAYED_RELEASE_TABLET | Freq: Every day | ORAL | Status: DC
  Administered 2018-08-20: 40 mg via ORAL
  Filled 2018-08-20 (×2): qty 1

## 2018-08-20 MED ORDER — CEFAZOLIN SODIUM-DEXTROSE 2-4 GM/100ML-% IV SOLN
INTRAVENOUS | Status: AC
  Filled 2018-08-20: qty 100

## 2018-08-20 MED ORDER — MIDAZOLAM HCL 5 MG/5ML IJ SOLN
INTRAMUSCULAR | Status: DC | PRN
  Administered 2018-08-20: 1 mg via INTRAVENOUS

## 2018-08-20 MED ORDER — LEVOTHYROXINE SODIUM 75 MCG PO TABS
75.0000 ug | ORAL_TABLET | Freq: Every day | ORAL | Status: DC
  Administered 2018-08-21: 75 ug via ORAL
  Filled 2018-08-20: qty 1

## 2018-08-20 MED ORDER — APIXABAN 5 MG PO TABS
5.0000 mg | ORAL_TABLET | Freq: Two times a day (BID) | ORAL | Status: DC
  Administered 2018-08-20 (×2): 5 mg via ORAL
  Filled 2018-08-20 (×3): qty 1

## 2018-08-20 MED ORDER — AMLODIPINE BESYLATE 5 MG PO TABS
5.0000 mg | ORAL_TABLET | Freq: Every day | ORAL | Status: DC
  Administered 2018-08-20: 5 mg via ORAL
  Filled 2018-08-20 (×2): qty 1

## 2018-08-20 MED ORDER — HEPARIN (PORCINE) IN NACL 1000-0.9 UT/500ML-% IV SOLN
INTRAVENOUS | Status: AC
  Filled 2018-08-20: qty 500

## 2018-08-20 MED ORDER — ACETAMINOPHEN 325 MG PO TABS
650.0000 mg | ORAL_TABLET | Freq: Two times a day (BID) | ORAL | Status: DC
  Administered 2018-08-20 (×2): 650 mg via ORAL
  Filled 2018-08-20 (×3): qty 2

## 2018-08-20 MED ORDER — SODIUM CHLORIDE 0.9 % IV SOLN
INTRAVENOUS | Status: DC
  Administered 2018-08-20: 09:00:00 via INTRAVENOUS

## 2018-08-20 MED ORDER — MIDAZOLAM HCL 5 MG/5ML IJ SOLN
INTRAMUSCULAR | Status: AC
  Filled 2018-08-20: qty 5

## 2018-08-20 MED ORDER — ONDANSETRON HCL 4 MG/2ML IJ SOLN: 4.0000 mg | Freq: Four times a day (QID) | INTRAMUSCULAR | Status: DC | PRN

## 2018-08-20 MED ORDER — FENTANYL CITRATE (PF) 100 MCG/2ML IJ SOLN
INTRAMUSCULAR | Status: AC
  Filled 2018-08-20: qty 2

## 2018-08-20 MED ORDER — HYDROCHLOROTHIAZIDE 12.5 MG PO CAPS
12.5000 mg | ORAL_CAPSULE | Freq: Every day | ORAL | Status: DC
  Administered 2018-08-20: 12.5 mg via ORAL
  Filled 2018-08-20: qty 1

## 2018-08-20 SURGICAL SUPPLY — 6 items
CABLE SURGICAL S-101-97-12 (CABLE) ×2 IMPLANT
LEAD TENDRIL MRI 58CM LPA1200M (Lead) ×2 IMPLANT
PACEMAKER ASSURITY SR-SF (Pacemaker) ×2 IMPLANT
PAD PRO RADIOLUCENT 2001M-C (PAD) ×2 IMPLANT
SHEATH CLASSIC 8F (SHEATH) ×2 IMPLANT
TRAY PACEMAKER INSERTION (PACKS) ×2 IMPLANT

## 2018-08-20 NOTE — H&P (Addendum)
Electrophysiology Office Note   Date:  08/20/2018   ID:  Daniel Everett, DOB 07/01/1931, MRN [id number]  PCP:  Daniel Post, MD  Cardiologist:  Daniel Everett Primary Electrophysiologist:  Daniel Haw, MD    No chief complaint on file.    History of Present Illness: Daniel Everett is a 85 y.o. male who is being seen today for the evaluation of atrial fibrillation at the request of No ref. provider found. Presenting today for electrophysiology evaluation.  He has a history of atrial fibrillation and hypertension.  He wore a cardiac monitor in 2018 that showed heart rates in the 20s with up to 3-second pauses.  Bradycardia occurred mainly during sleeping hours.  He had a fall in January down the stairs.  He says that he did not have syncope at that time.  He has had a slow ventricular response with his atrial fibrillation in the past.  He is on no rate controlling medications.  He does have ongoing shortness of breath and fatigue.  He feels that this may be due to simply his a  Today, denies symptoms of palpitations, chest pain, shortness of breath, orthopnea, PND, lower extremity edema, claudication, dizziness, presyncope, syncope, bleeding, or neurologic sequela. The patient is tolerating medications without difficulties. Plan for pacemaker today.   Past Medical History:  Diagnosis Date  . Allergic rhinitis due to pollen 06/07/2008  . Atrial fibrillation ([place])   . Benign essential HTN   . COLONIC POLYPS, HX OF 10/28/2006  . DIVERTICULOSIS, COLON 11/27/2006  . Gait abnormality 03/02/2018  . GEN OSTEOARTHROSIS INVOLVING MULTIPLE SITES 01/03/2010  . HYPERGLYCEMIA 10/18/2008  . HYPOTHYROIDISM 11/27/2006  . NEOPLASM, MALIGNANT, PROSTATE, HX OF, S/P TURP 05/28/2007  . OSTEOARTHRITIS 11/27/2006  . Peripheral neuropathy 04/08/2018  . WEIGHT LOSS, RECENT 11/27/2006   Past Surgical History:  Procedure Laterality Date  . arthroscopy rt knee    . HYDROCELE EXCISION / REPAIR    . PROSTATE  SURGERY     TURP  . ROTATOR CUFF REPAIR       Current Facility-Administered Medications  Medication Dose Route Frequency Provider Last Rate Last Dose  . 0.9 %  sodium chloride infusion   Intravenous Continuous Daniel Blincoe Hassell Done, MD      . ceFAZolin (ANCEF) IVPB 2g/100 mL premix  2 g Intravenous On Call Daniel Liwanag Hassell Done, MD      . chlorhexidine (HIBICLENS) 4 % liquid 4 application  60 mL Topical Once Daniel Fedele Hassell Done, MD      . chlorhexidine (HIBICLENS) 4 % liquid 4 application  60 mL Topical Once Daniel Kazanjian Hassell Done, MD      . gentamicin (GARAMYCIN) 80 mg in sodium chloride 0.9 % 500 mL irrigation  80 mg Irrigation On Call Daniel Enwright Hassell Done, MD      . mupirocin ointment (BACTROBAN) 2 % 1 application  1 application Topical Once Daniel Everett, Daniel Doyne, MD        Allergies:   Patient has no known allergies.   Social History:  The patient  reports that he has never smoked. He has never used smokeless tobacco. He reports that he does not drink alcohol or use drugs.   Family History:  The patient's family history includes Cancer in his father; Coronary artery disease in his mother; Heart disease in his mother; Pancreatic cancer in his father; Tremor in his sister.    ROS:  Please see the history of present illness.   Otherwise, review of systems is positive  for none.   All other systems are reviewed and negative.   PHYSICAL EXAM: VS:  BP 135/89   Pulse (!) 49   Temp (!) 97.4 F (36.3 C) (Oral)   Resp 18   Ht 6\' 5"  (1.956 m)   Wt 89.8 kg   SpO2 98%   BMI 23.48 kg/m  , BMI Body mass index is 23.48 kg/m. GEN: Well nourished, well developed, in no acute distress  HEENT: normal  Neck: no JVD, carotid bruits, or masses Cardiac: iRRR; no murmurs, rubs, or gallops,no edema  Respiratory:  clear to auscultation bilaterally, normal work of breathing GI: soft, nontender, nondistended, + BS MS: no deformity or atrophy  Skin: warm and dry Neuro:  Strength and sensation are  intact Psych: euthymic mood, full affect  Recent Labs: 09/26/2017: BUN 11; Creatinine, Ser 0.88; Hemoglobin 15.2; Platelets 183.0; Potassium 4.3; Sodium 136; TSH 2.17    Lipid Panel     Component Value Date/Time   CHOL 134 12/25/2016 0827   TRIG 52.0 12/25/2016 0827   HDL 44.90 12/25/2016 0827   CHOLHDL 3 12/25/2016 0827   VLDL 10.4 12/25/2016 0827   LDLCALC 78 12/25/2016 0827     Wt Readings from Last 3 Encounters:  08/20/18 89.8 kg  07/16/18 90.6 kg  05/20/18 89.8 kg      Other studies Reviewed: Additional studies/ records that were reviewed today include: TTE 04/08/16  Review of the above records today demonstrates:  - Left ventricle: The cavity size was normal. Wall thickness was   normal. Systolic function was normal. The estimated ejection   fraction was in the range of 55% to 60%. Wall motion was normal;   there were no regional wall motion abnormalities. The study is   not technically sufficient to allow evaluation of LV diastolic   function. - Aortic valve: There was trivial regurgitation. - Mitral valve: There was mild regurgitation. - Left atrium: The atrium was severely dilated. - Right atrium: The atrium was severely dilated. - Atrial septum: No defect or patent foramen ovale was identified. - Tricuspid valve: There was moderate regurgitation.  Holter 04/04/16 - personally reviewed  Atrial fibrillation with slow ventricular response  Rare PVCs and PVC couplets  Longest pause 3.2 seconds  ASSESSMENT AND PLAN:  1.  Permanent atrial fibrillation with slow ventricular response: due to slow ventricular response, Daniel Everett plan for pacemaker implant.   Daniel Everett has presented today for surgery, with the diagnosis of atrial fibrillaiton with slow response.  The various methods of treatment have been discussed with the patient and family. After consideration of risks, benefits and other options for treatment, the patient has consented to  Procedure(s):  pacemaker implant as a surgical intervention .  Risks include but not limited to bleeding, tamponade, infection, pneumothorax, among others. The patient's history has been reviewed, patient examined, no change in status, stable for surgery.  I have reviewed the patient's chart and labs.  Questions were answered to the patient's satisfaction.     This patients CHA2DS2-VASc Score and unadjusted Ischemic Stroke Rate (% per year) is equal to 3.2 % stroke rate/year from a score of 3  Above score calculated as 1 point each if present [CHF, HTN, DM, Vascular=MI/PAD/Aortic Plaque, Age if 65-74, or Male] Above score calculated as 2 points each if present [Age > 75, or Stroke/TIA/TE]  2.  Hypertension: well controlled   Signed, Hobart Marte Meredith Leeds, MD  08/20/2018 9:00 AM

## 2018-08-20 NOTE — Progress Notes (Signed)
Patients daughter Katharine Look contacted and updated that patient arrived into room questions answered at this time. Chastin Riesgo, Bettina Gavia rN

## 2018-08-20 NOTE — Progress Notes (Signed)
Patient arrived from cath lab to [facility]. Vital sign obtained and patient placed on monitor. CHG bath done. CCMD made aware. bp 155/103 heart rate 60. Rt upper back with appearance of tape burn to skin . Pacer site without evidence of hematoma or bleeding at this time. Will monitor patient. Prosperity Darrough, Bettina Gavia rN

## 2018-08-20 NOTE — Discharge Summary (Addendum)
ELECTROPHYSIOLOGY PROCEDURE DISCHARGE SUMMARY    Patient ID: Daniel Everett,  MRN: [id number], DOB/AGE: [birth year] 85 y.o.  Admit date: 08/20/2018 Discharge date: 08/21/2018  Primary Care Physician: [name], MD  Primary Cardiologist: Dr. Martinique Electrophysiologist: Dr. Curt Bears  Primary Discharge Diagnosis:  Symptomatic bradycardia status post pacemaker implantation this admission  Secondary Discharge Diagnosis:  1. Permanent AFib     CHA2DS2Vasc is 3, on Eliquis, appropriately dosed 2. HTN 3. Hypothyroidism 4. RBBB  No Known Allergies   Procedures This Admission:  1.  Implantation of a SJM single chamber PPM on 08/20/2018 by Dr Curt Bears.  The patient received a St[employer]ude Medical model LPA1200M Tendril (serial number  [id number]) right ventricular lead, St Jude Medical Assurity MRI  model [id number] (serial number  [id number] ) pacemaker There were no immediate post procedure complications. 2.  CXR on 08/21/2018 demonstrated no pneumothorax status post device implantation.   Brief HPI: Daniel Everett is a 85 y.o. male was referred to electrophysiology in the outpatient setting for consideration of PPM implantation.  Past medical history includes above.  The patient has had symptomatic bradycardia without reversible causes identified.  Risks, benefits, and alternatives to PPM implantation were reviewed with the patient who wished to proceed.   Hospital Course:  The patient was admitted and underwent implantation of a PPM with details as outlined above.  He was monitored on telemetry overnight which demonstrated AFib, V pacing.  Left chest was without hematoma or ecchymosis.  The device was interrogated and found to be functioning normally.  CXR was obtained and demonstrated no pneumothorax status post device implantation.  Wound care, arm mobility, and restrictions were reviewed with the patient.  The patient feels well, no CP or SOB, he was examined by Dr. Curt Bears and considered  stable for discharge to home.    Physical Exam: Vitals:   08/20/18 1358 08/20/18 1600 08/20/18 1937 08/21/18 0349  BP: (!) 155/103 121/89 124/80 123/89  Pulse:  (!) 59 60 (!) 55  Resp:  19 17 (!) 9  Temp:   (!) 97.5 F (36.4 C) 97.8 F (36.6 C)  TempSrc:   Oral Oral  SpO2:  94% 97% 95%  Weight:      Height:        GEN- The patient is well appearing, alert and oriented x 3 today.   HEENT: normocephalic, atraumatic; sclera clear, conjunctiva pink; hearing intact; neck supple, no JVP Lungs- CTA b/l, normal work of breathing.  No wheezes, rales, rhonchi Heart- RRR, no murmurs, rubs or gallops, PMI not laterally displaced GI- soft, non-tender, non-distended Extremities- no clubbing, cyanosis, or edema MS- no significant deformity or atrophy Skin- warm and dry, no rash or lesion, left chest without hematoma/ecchymosis Psych- euthymic mood, full affect Neuro- no gross deficits   Labs:   Lab Results  Component Value Date   WBC 5.2 08/20/2018   HGB 15.0 08/20/2018   HCT 44.0 08/20/2018   MCV 94.5 08/20/2018   PLT 161 08/20/2018    Recent Labs  Lab 08/20/18 1056  NA 135  K 3.3*  CL 100  BUN 11  CREATININE 0.70  GLUCOSE 106*    Discharge Medications:  Allergies as of 08/21/2018   No Known Allergies     Medication List    TAKE these medications   acetaminophen 650 MG CR tablet Commonly known as:  TYLENOL Take 650 mg by mouth 2 (two) times a day.   amLODipine 5 MG tablet Commonly known as:  NORVASC Take 1 tablet (5 mg total) by mouth daily.   apixaban 5 MG Tabs tablet Commonly known as:  Eliquis Take 1 tablet (5 mg total) by mouth 2 (two) times daily.   atorvastatin 20 MG tablet Commonly known as:  LIPITOR Take 1 tablet (20 mg total) by mouth daily.   finasteride 5 MG tablet Commonly known as:  PROSCAR TAKE 1 TABLET DAILY   fluorouracil 5 % cream Commonly known as:  EFUDEX Apply topically 2 (two) times daily. Notes to patient:  This is reported as a  completed treatment, only use as originally prescribed.   hydrochlorothiazide 12.5 MG capsule Commonly known as:  MICROZIDE Take 1 capsule (12.5 mg total) by mouth daily. What changed:  when to take this   latanoprost 0.005 % ophthalmic solution Commonly known as:  XALATAN Place 1 drop into both eyes at bedtime.   levothyroxine 75 MCG tablet Commonly known as:  SYNTHROID TAKE 1 TABLET DAILY What changed:  when to take this   lisinopril 20 MG tablet Commonly known as:  ZESTRIL Take 1 tablet (20 mg total) by mouth daily.   multivitamin with minerals Tabs tablet Take 1 tablet by mouth daily.   omeprazole 20 MG capsule Commonly known as:  PRILOSEC Take 1 capsule (20 mg total) by mouth daily.   timolol 0.5 % ophthalmic solution Commonly known as:  TIMOPTIC Place 1 drop into both eyes 2 (two) times daily.   traMADol 50 MG tablet Commonly known as:  ULTRAM Take 1 every 6 hours for pain not helped by Tylenol alone   vitamin B-6 500 MG tablet Take 500 mg by mouth daily.       Disposition:  Home Discharge Instructions    Diet - low sodium heart healthy   Complete by:  As directed    Increase activity slowly   Complete by:  As directed      Follow-up Information    [place] [facility] Follow up.   Specialty:  Cardiology Why:  09/01/2018 @ 2:00PM, wound check visit Contact information: [address] [place]       Constance Haw, MD Follow up.   Specialty:  Cardiology Why:  11/20/2018 @ 2:00PM Contact information: [place] [address] [phone]           Duration of Discharge Encounter: Greater than 30 minutes including physician time.  Venetia Night, PA-C 08/21/2018 8:53 AM   I have seen and examined this patient with Tommye Standard.  Agree with above, note added to reflect my findings.  On exam, RRR, no murmurs, lungs clear.  She is now status post  St. Jude pacemaker for AF with slow response.  Device functioning appropriately.  Chest x-ray and interrogation without issue.  Plan for discharge today with follow-up in device clinic.  Artist Bloom M. Hanad Leino MD 08/21/2018 9:59 AM

## 2018-08-20 NOTE — Progress Notes (Signed)
Orthopedic Tech Progress Note Patient Details:  Daniel Everett 1931-04-24 [id number] Patient has sling Patient ID: Daniel Everett, male   DOB: [birth year], 85 y.o.   MRN: [id number]   Daniel Everett 08/20/2018, 2:38 PM

## 2018-08-20 NOTE — Progress Notes (Signed)
K+ 3.3, Dr Curt Bears was informed, no further orders

## 2018-08-21 ENCOUNTER — Ambulatory Visit (HOSPITAL_COMMUNITY): Payer: Medicare HMO

## 2018-08-21 ENCOUNTER — Encounter (HOSPITAL_COMMUNITY): Payer: Self-pay | Admitting: Cardiology

## 2018-08-21 DIAGNOSIS — Z7901 Long term (current) use of anticoagulants: Secondary | ICD-10-CM | POA: Diagnosis not present

## 2018-08-21 DIAGNOSIS — I4821 Permanent atrial fibrillation: Secondary | ICD-10-CM | POA: Diagnosis not present

## 2018-08-21 DIAGNOSIS — Z8249 Family history of ischemic heart disease and other diseases of the circulatory system: Secondary | ICD-10-CM | POA: Diagnosis not present

## 2018-08-21 DIAGNOSIS — Z79899 Other long term (current) drug therapy: Secondary | ICD-10-CM | POA: Diagnosis not present

## 2018-08-21 DIAGNOSIS — I10 Essential (primary) hypertension: Secondary | ICD-10-CM | POA: Diagnosis not present

## 2018-08-21 DIAGNOSIS — Z950 Presence of cardiac pacemaker: Secondary | ICD-10-CM | POA: Diagnosis not present

## 2018-08-21 DIAGNOSIS — M1990 Unspecified osteoarthritis, unspecified site: Secondary | ICD-10-CM | POA: Diagnosis not present

## 2018-08-21 DIAGNOSIS — E1140 Type 2 diabetes mellitus with diabetic neuropathy, unspecified: Secondary | ICD-10-CM | POA: Diagnosis not present

## 2018-08-21 DIAGNOSIS — Z8546 Personal history of malignant neoplasm of prostate: Secondary | ICD-10-CM | POA: Diagnosis not present

## 2018-08-21 DIAGNOSIS — I4510 Unspecified right bundle-branch block: Secondary | ICD-10-CM | POA: Diagnosis not present

## 2018-08-21 DIAGNOSIS — E039 Hypothyroidism, unspecified: Secondary | ICD-10-CM | POA: Diagnosis not present

## 2018-08-21 MED FILL — Lidocaine HCl Local Inj 1%: INTRAMUSCULAR | Qty: 60 | Status: AC

## 2018-08-21 NOTE — Discharge Instructions (Signed)
° ° °  Supplemental Discharge Instructions for  Pacemaker/Defibrillator Patients  Activity No heavy lifting or vigorous activity with your left/right arm for 6 to 8 weeks.  Do not raise your left/right arm above your head for one week.  Gradually raise your affected arm as drawn below.             08/25/2018                   08/26/2018                  08/27/2018                 08/28/2018 __  NO DRIVING until cleared to at your wound check visit.  WOUND CARE - Keep the wound area clean and dry.  Do not get this area wet, no showers until cleared to at your wound check visit - The tape/steri-strips on your wound will fall off; do not pull them off.  No bandage is needed on the site.  DO  NOT apply any creams, oils, or ointments to the wound area. - If you notice any drainage or discharge from the wound, any swelling or bruising at the site, or you develop a fever > 101? F after you are discharged home, call the office at once.  Special Instructions - You are still able to use cellular telephones; use the ear opposite the side where you have your pacemaker/defibrillator.  Avoid carrying your cellular phone near your device. - When traveling through airports, show security personnel your identification card to avoid being screened in the metal detectors.  Ask the security personnel to use the hand wand. - Avoid arc welding equipment, MRI testing (magnetic resonance imaging), TENS units (transcutaneous nerve stimulators).  Call the office for questions about other devices. - Avoid electrical appliances that are in poor condition or are not properly grounded. - Microwave ovens are safe to be near or to operate.

## 2018-08-21 NOTE — Plan of Care (Signed)
  Problem: Education: Goal: Knowledge of General Education information will improve Description: Including pain rating scale, medication(s)/side effects and non-pharmacologic comfort measures Outcome: Adequate for Discharge   Problem: Health Behavior/Discharge Planning: Goal: Ability to manage health-related needs will improve Outcome: Adequate for Discharge   Problem: Clinical Measurements: Goal: Ability to maintain clinical measurements within normal limits will improve Outcome: Adequate for Discharge Goal: Will remain free from infection Outcome: Adequate for Discharge Goal: Diagnostic test results will improve Outcome: Adequate for Discharge Goal: Respiratory complications will improve Outcome: Adequate for Discharge Goal: Cardiovascular complication will be avoided Outcome: Adequate for Discharge   Problem: Coping: Goal: Level of anxiety will decrease Outcome: Adequate for Discharge   Problem: Elimination: Goal: Will not experience complications related to urinary retention Outcome: Adequate for Discharge   Problem: Pain Managment: Goal: General experience of comfort will improve Outcome: Adequate for Discharge   Problem: Safety: Goal: Ability to remain free from injury will improve Outcome: Adequate for Discharge   Problem: Skin Integrity: Goal: Risk for impaired skin integrity will decrease Outcome: Adequate for Discharge   

## 2018-08-26 ENCOUNTER — Telehealth: Payer: Self-pay | Admitting: *Deleted

## 2018-08-26 NOTE — Telephone Encounter (Signed)
Due to current COVID 19 pandemic, our office is severely reducing in office visits until further notice, in order to minimize the risk to our patients and healthcare providers. He states does not have laptop or smartphone with camera.  Daughter, Katharine Look does, she is possibility if able to work it out.  I LMVM for her.  If she calls back this would be for doxy,me visit 09-01-18 at 0945 with Butler Denmark, NP.

## 2018-08-31 NOTE — Telephone Encounter (Signed)
I spoke to pt and he would prefer not to do telephone..  Not able to do VV.  Made appt 09-23-18 when come in office at [place].  He verbalized understanding.

## 2018-09-01 ENCOUNTER — Ambulatory Visit: Payer: Medicare HMO | Admitting: Neurology

## 2018-09-01 ENCOUNTER — Other Ambulatory Visit: Payer: Self-pay

## 2018-09-01 ENCOUNTER — Ambulatory Visit (INDEPENDENT_AMBULATORY_CARE_PROVIDER_SITE_OTHER): Payer: Medicare HMO | Admitting: *Deleted

## 2018-09-01 DIAGNOSIS — I4891 Unspecified atrial fibrillation: Secondary | ICD-10-CM | POA: Diagnosis not present

## 2018-09-01 LAB — CUP PACEART INCLINIC DEVICE CHECK
Battery Remaining Longevity: 123 mo
Battery Voltage: 3.05 V
Brady Statistic RV Percent Paced: 86 %
Date Time Interrogation Session: 20200609143231
Implantable Lead Implant Date: 20200528
Implantable Lead Location: 753860
Implantable Pulse Generator Implant Date: 20200528
Lead Channel Impedance Value: 612.5 Ohm
Lead Channel Pacing Threshold Amplitude: 0.625 V
Lead Channel Pacing Threshold Pulse Width: 0.5 ms
Lead Channel Sensing Intrinsic Amplitude: 3.1 mV
Lead Channel Setting Pacing Amplitude: 0.875
Lead Channel Setting Pacing Pulse Width: 0.5 ms
Lead Channel Setting Sensing Sensitivity: 2 mV
Pulse Gen Model: 1272
Pulse Gen Serial Number: 9116714

## 2018-09-01 NOTE — Progress Notes (Signed)
Wound check appointment. Steri-strips removed. Wound without redness or edema. Incision edges approximated, wound healing well. Normal device function. Thresholds, sensing, and impedances consistent with implant measurements. R wave amplitude variable (2.15mV-6.4mV today in clinic). Device programmed at 3.5V for extra safety margin until 3 month visit. Histogram distribution appropriate for patient and level of activity. No high ventricular rates noted. Patient educated about wound care, arm mobility, lifting restrictions. ROV 11/20/18 with Dr. Curt Bears.

## 2018-09-21 ENCOUNTER — Other Ambulatory Visit: Payer: Self-pay | Admitting: Cardiology

## 2018-09-22 NOTE — Progress Notes (Signed)
PATIENT: Daniel Everett DOB: 1932/03/25  REASON FOR VISIT: follow up, essential tremor, peripheral neuropathy HISTORY FROM: patient  HISTORY OF PRESENT ILLNESS: Today 09/23/18  Daniel Everett is an 85 year old male with history of essential tremor and peripheral neuropathy.  Since his last visit, he has had a pacemaker placed.  He had an EMG and nerve conduction studies in January 2020 showing evidence of a primarily axonal peripheral neuropathy of moderate severity.  EMG of the right lower extremity shows chronic distal stable signs of denervation consistent with diagnosis of peripheral neuropathy, no evidence of a lumbosacral radiculopathy was noted.  He denies any pain associated with his neuropathy.  His balance is not great.  His last fall occurred in January.  He reports he does live alone, he does drive a car.  He has family in town.  Today, he complains that his essential tremor in his hands has worsened.  He says there is been a gradual progression over time.  He reports that doing fine motor skill task and handwriting have become more difficult.  He thinks that he wants to try medication for his essential tremor.  He does not use a cane or walker for ambulation.  He reports eating is not especially difficult.  He presents for follow-up unaccompanied.  HISTORY  03/02/2018 Dr. Jannifer Franklin: Daniel Everett is an 85 year old right-handed white male with a history of an essential tremor.  The patient comes in with a new problem today, he believes that over the last 6 months he has had some decline in his balance.  On his initial examination in January 2019 it was noted that he had decreased vibration and position sense in both feet.  The patient himself does not note any numbness or burning sensations in the feet, he does note some crawling sensations on the left face or neck.  He denies any weakness, he denies neck pain or low back pain or pain down the arms or legs.  He continues to have the tremor that has  gradually worsened over time, he notes more difficulty with handwriting, he may have some tremor with feeding himself.  In the past he has not wanted to go on any medications for the tremor.  The patient comes to the office today for an evaluation.  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  He has not had any falls.  He does not use a cane or a walker.  REVIEW OF SYSTEMS: Out of a complete 14 system review of symptoms, the patient complains only of the following symptoms, and all other reviewed systems are negative.  Tremor  ALLERGIES: No Known Allergies  HOME MEDICATIONS: Outpatient Medications Prior to Visit  Medication Sig Dispense Refill   acetaminophen (TYLENOL) 650 MG CR tablet Take 650 mg by mouth 2 (two) times a day.     amLODipine (NORVASC) 5 MG tablet Take 1 tablet (5 mg total) by mouth daily. 90 tablet 3   apixaban (ELIQUIS) 5 MG TABS tablet Take 1 tablet (5 mg total) by mouth 2 (two) times daily. 180 tablet 3   atorvastatin (LIPITOR) 20 MG tablet Take 1 tablet (20 mg total) by mouth daily. 90 tablet 3   finasteride (PROSCAR) 5 MG tablet TAKE 1 TABLET DAILY (Patient taking differently: Take 5 mg by mouth daily. ) 90 tablet 2   hydrochlorothiazide (MICROZIDE) 12.5 MG capsule Take 1 capsule (12.5 mg total) by mouth daily. (Patient taking differently: Take 12.5  mg by mouth at bedtime. ) 90 capsule 3   latanoprost (XALATAN) 0.005 % ophthalmic solution Place 1 drop into both eyes at bedtime.      levothyroxine (SYNTHROID, LEVOTHROID) 75 MCG tablet TAKE 1 TABLET DAILY (Patient taking differently: Take 75 mcg by mouth daily before breakfast. ) 90 tablet 2   lisinopril (ZESTRIL) 20 MG tablet TAKE 1 TABLET DAILY 90 tablet 0   Multiple Vitamin (MULTIVITAMIN WITH MINERALS) TABS tablet Take 1 tablet by mouth daily.     omeprazole (PRILOSEC) 20 MG capsule Take 1 capsule (20 mg total) by mouth daily. 30 capsule 3   Pyridoxine HCl  (VITAMIN B-6) 500 MG tablet Take 500 mg by mouth daily.     timolol (TIMOPTIC) 0.5 % ophthalmic solution Place 1 drop into both eyes 2 (two) times daily.       traMADol (ULTRAM) 50 MG tablet Take 1 every 6 hours for pain not helped by Tylenol alone 15 tablet 0   fluorouracil (EFUDEX) 5 % cream Apply topically 2 (two) times daily.     No facility-administered medications prior to visit.     PAST MEDICAL HISTORY: Past Medical History:  Diagnosis Date   Allergic rhinitis due to pollen 06/07/2008   Atrial fibrillation ([place])    Benign essential HTN    COLONIC POLYPS, HX OF 10/28/2006   DIVERTICULOSIS, COLON 11/27/2006   Gait abnormality 03/02/2018   GEN OSTEOARTHROSIS INVOLVING MULTIPLE SITES 01/03/2010   HYPERGLYCEMIA 10/18/2008   HYPOTHYROIDISM 11/27/2006   NEOPLASM, MALIGNANT, PROSTATE, HX OF, S/P TURP 05/28/2007   OSTEOARTHRITIS 11/27/2006   Peripheral neuropathy 04/08/2018   Presence of permanent cardiac pacemaker 08/20/2018   WEIGHT LOSS, RECENT 11/27/2006    PAST SURGICAL HISTORY: Past Surgical History:  Procedure Laterality Date   arthroscopy rt knee     HYDROCELE EXCISION / REPAIR     INSERT / REPLACE / REMOVE PACEMAKER  08/20/2018   PACEMAKER IMPLANT N/A 08/20/2018   Procedure: PACEMAKER IMPLANT;  Surgeon: Constance Haw, MD;  Location: [facility];  Service: Cardiovascular;  Laterality: N/A;   PROSTATE SURGERY     TURP   ROTATOR CUFF REPAIR      FAMILY HISTORY: Family History  Problem Relation Age of Onset   Coronary artery disease Mother    Heart disease Mother    Pancreatic cancer Father    Cancer Father    Tremor Sister     SOCIAL HISTORY: Social History   Socioeconomic History   Marital status: Widowed    Spouse name: Not on file   Number of children: Not on file   Years of education: Not on file   Highest education level: Not on file  Occupational History   Occupation: retired  Scientist, product/process development  strain: Not on file   Food insecurity    Worry: Not on file    Inability: Not on Lexicographer needs    Medical: Not on file    Non-medical: Not on file  Tobacco Use   Smoking status: Never Smoker   Smokeless tobacco: Never Used  Substance and Sexual Activity   Alcohol use: No   Drug use: No   Sexual activity: Not Currently  Lifestyle   Physical activity    Days per week: Not on file    Minutes per session: Not on file   Stress: Not on file  Relationships   Social connections    Talks on phone: Not on file  Gets together: Not on file    Attends religious service: Not on file    Active member of club or organization: Not on file    Attends meetings of clubs or organizations: Not on file    Relationship status: Not on file   Intimate partner violence    Fear of current or ex partner: Not on file    Emotionally abused: Not on file    Physically abused: Not on file    Forced sexual activity: Not on file  Other Topics Concern   Not on file  Social History Narrative   Lives alone   Caffeine use: very little   Right handed       Spouse is in a nursing home with dementia   She was in 24/7 care at home for 3.5 year   Also has private attendant    She can't talk   3 children    2 in [place] and one in near [place] still goes to church; 'used to play sports   Basketball through college; Education administrator; now [employer] in [place]:   09/23/18 0852  BP: 122/88  Pulse: 62  Temp: 97.7 F (36.5 C)  Weight: 188 lb 9.6 oz (85.5 kg)  Height: 6\' 4"  (1.93 m)   Body mass index is 22.96 kg/m.  Generalized: Well developed, in no acute distress   Neurological examination  Mentation: Alert oriented to time, place, history taking. Follows all commands speech and language fluent Cranial nerve II-XII: Pupils were equal round reactive to light. Extraocular movements were full, visual field were full on  confrontational test. Facial sensation and strength were normal. Uvula tongue midline. Head turning and shoulder shrug  were normal and symmetric. Motor: The motor testing reveals 5 over 5 strength of all 4 extremities. Good symmetric motor tone is noted throughout. Mild- Moderate tremor noted to both hands with action Sensory: Sensory testing is intact to soft touch on all 4 extremities. No evidence of extinction is noted.  Coordination: Cerebellar testing reveals good finger-nose-finger and heel-to-shin bilaterally.  Gait and station: Gait is normal. Tandem gait not attempted, patient did not feel comfortable Reflexes: Deep tendon reflexes are symmetric and normal bilaterally.   DIAGNOSTIC DATA (LABS, IMAGING, TESTING) - I reviewed patient records, labs, notes, testing and imaging myself where available.  Lab Results  Component Value Date   WBC 5.2 08/20/2018   HGB 15.0 08/20/2018   HCT 44.0 08/20/2018   MCV 94.5 08/20/2018   PLT 161 08/20/2018      Component Value Date/Time   NA 135 08/20/2018 1056   NA 139 04/04/2016 1413   K 3.3 (L) 08/20/2018 1056   CL 100 08/20/2018 1056   CO2 30 09/26/2017 0833   GLUCOSE 106 (H) 08/20/2018 1056   BUN 11 08/20/2018 1056   BUN 16 04/04/2016 1413   CREATININE 0.70 08/20/2018 1056   CALCIUM 9.0 09/26/2017 0833   PROT 6.2 04/08/2018 1503   ALBUMIN 4.1 12/25/2016 0827   AST 24 12/25/2016 0827   ALT 19 12/25/2016 0827   ALKPHOS 47 12/25/2016 0827   BILITOT 1.2 12/25/2016 0827   GFRNONAA 72 04/04/2016 1413   GFRAA 84 04/04/2016 1413   Lab Results  Component Value Date   CHOL 134 12/25/2016   HDL 44.90 12/25/2016   LDLCALC 78 12/25/2016   TRIG 52.0 12/25/2016   CHOLHDL 3 12/25/2016   Lab  Results  Component Value Date   HGBA1C 6.0 10/18/2008   Lab Results  Component Value Date   VITAMINB12 458 03/02/2018   Lab Results  Component Value Date   TSH 2.17 09/26/2017      ASSESSMENT AND PLAN 85 y.o. year old male  has a past  medical history of Allergic rhinitis due to pollen (06/07/2008), Atrial fibrillation ([place]), Benign essential HTN, COLONIC POLYPS, HX OF (10/28/2006), DIVERTICULOSIS, COLON (11/27/2006), Gait abnormality (03/02/2018), GEN OSTEOARTHROSIS INVOLVING MULTIPLE SITES (01/03/2010), HYPERGLYCEMIA (10/18/2008), HYPOTHYROIDISM (11/27/2006), NEOPLASM, MALIGNANT, PROSTATE, HX OF, S/P TURP (05/28/2007), OSTEOARTHRITIS (11/27/2006), Peripheral neuropathy (04/08/2018), Presence of permanent cardiac pacemaker (08/20/2018), and WEIGHT LOSS, RECENT (11/27/2006). here with:  1. Essential tremor 2. Peripheral neuropathy   Today, he is wanting to try medication for his essential tremor.  He reports he feels that the tremor has worsened.  He now has more difficulty with fine motor skills, especially handwriting.  I was going to try primidone, however that is not a good choice given that he is on Eliquis now after pacemaker placement.  I did not choose propanolol, due to recent pacemaker placement.  He will try low-dose gabapentin taking 100 mg twice a day for essential tremor.  Peripheral neuropathy is stable, is not producing any pain or discomfort.  He will follow-up in 3 months or sooner if needed.  I advised him that if his symptoms worsen or if he develops any new symptoms he should let us know.   I spent 15 minutes with the patient. 50% of this time was spent discussing his plan of care.   Butler Denmark, AGNP-C, DNP 09/23/2018, 10:51 AM [facility] [address] [place], [place] [address] [phone]

## 2018-09-23 ENCOUNTER — Ambulatory Visit: Payer: Medicare HMO | Admitting: Neurology

## 2018-09-23 ENCOUNTER — Other Ambulatory Visit: Payer: Self-pay

## 2018-09-23 ENCOUNTER — Encounter: Payer: Self-pay | Admitting: Neurology

## 2018-09-23 VITALS — BP 122/88 | HR 62 | Temp 97.7°F | Ht 76.0 in | Wt 188.6 lb

## 2018-09-23 DIAGNOSIS — G603 Idiopathic progressive neuropathy: Secondary | ICD-10-CM

## 2018-09-23 DIAGNOSIS — G250 Essential tremor: Secondary | ICD-10-CM | POA: Diagnosis not present

## 2018-09-23 MED ORDER — GABAPENTIN 100 MG PO CAPS: 100.0000 mg | ORAL_CAPSULE | Freq: Two times a day (BID) | ORAL | 3 refills | Status: DC

## 2018-09-23 MED ORDER — PRIMIDONE 50 MG PO TABS: 25.0000 mg | ORAL_TABLET | Freq: Every day | ORAL | 3 refills | Status: DC

## 2018-09-23 NOTE — Progress Notes (Signed)
I have read the note, and I agree with the clinical assessment and plan.  Eliyanna Ault K Janila Arrazola   

## 2018-09-23 NOTE — Progress Notes (Signed)
I spoke to [place] at [phone] today from [facility] in [employer]  and told her to cancel the primidone prescription as new gabapenin order will take its place.   She will cancel the primidone rx.

## 2018-09-24 ENCOUNTER — Ambulatory Visit: Payer: Medicare HMO

## 2018-10-01 DIAGNOSIS — L821 Other seborrheic keratosis: Secondary | ICD-10-CM | POA: Diagnosis not present

## 2018-10-01 DIAGNOSIS — Z85828 Personal history of other malignant neoplasm of skin: Secondary | ICD-10-CM | POA: Diagnosis not present

## 2018-10-01 DIAGNOSIS — L570 Actinic keratosis: Secondary | ICD-10-CM | POA: Diagnosis not present

## 2018-10-01 DIAGNOSIS — L814 Other melanin hyperpigmentation: Secondary | ICD-10-CM | POA: Diagnosis not present

## 2018-10-01 DIAGNOSIS — D485 Neoplasm of uncertain behavior of skin: Secondary | ICD-10-CM | POA: Diagnosis not present

## 2018-10-01 DIAGNOSIS — D1801 Hemangioma of skin and subcutaneous tissue: Secondary | ICD-10-CM | POA: Diagnosis not present

## 2018-10-01 DIAGNOSIS — D229 Melanocytic nevi, unspecified: Secondary | ICD-10-CM | POA: Diagnosis not present

## 2018-10-07 ENCOUNTER — Telehealth: Payer: Self-pay | Admitting: Neurology

## 2018-10-07 DIAGNOSIS — C44319 Basal cell carcinoma of skin of other parts of face: Secondary | ICD-10-CM | POA: Diagnosis not present

## 2018-10-07 NOTE — Telephone Encounter (Signed)
I called pt back.  Relayed that gabapentin increase to 200mg  po bid was recommended and could go to 300mg  if still no results.  Daniel Everett has a further option if need of topamax.  Daniel Everett stated that cost for 30 day supply was a lot for him $26.  I told him that goodrx an option for generic (#360 100mg  caps for $16.50 ( sent/mailed him goodrx card and printed copy for [facility] in [place].).  Daniel Everett has 7 days of trying it at 200mg  po bid.  Daniel Everett will call as gets close to finishing his bottle.

## 2018-10-07 NOTE — Telephone Encounter (Signed)
Pt called in and stated the Gabapentin isnt helping , he stated the tremor has gotten worse

## 2018-10-07 NOTE — Telephone Encounter (Signed)
I think we may try a higher dose of gabapentin to see if it helps any. If he is open we can try 200 mg twice a day. We could go up to 300 mg twice daily after that if needed. 100 mg is such a low dose, I am not sure that would make the tremor worse. Also, topamax is an option in the future to keep in mind.

## 2018-10-07 NOTE — Telephone Encounter (Signed)
I called pt and LMVM for him that received message.  Taking gabapentin 100mg  po bid, tolerating ok.  Tremor worse. Will forward to Judson Roch, NP Please advise.

## 2018-10-08 ENCOUNTER — Ambulatory Visit: Payer: Medicare HMO | Admitting: Sports Medicine

## 2018-10-15 ENCOUNTER — Other Ambulatory Visit: Payer: Self-pay | Admitting: Neurology

## 2018-10-15 MED ORDER — TOPIRAMATE 25 MG PO TABS: ORAL_TABLET | ORAL | 3 refills | Status: DC

## 2018-10-15 NOTE — Telephone Encounter (Signed)
He can try Topamax 25 mg at bedtime to see if it helps. He should stop the gabapentin if he does not feel it is helpful.

## 2018-10-15 NOTE — Addendum Note (Signed)
Addended by: Suzzanne Cloud on: 10/15/2018 11:55 AM   Modules accepted: Orders

## 2018-10-15 NOTE — Telephone Encounter (Signed)
Pt called back.  With the increase of gabapentin 200mg  po bid, has not noticed any affect at all, and wants to know where we go from here.  You mentioned topamax as another option. Please advise.

## 2018-10-15 NOTE — Telephone Encounter (Signed)
I called pt and relayed that will try topiramate 25mg  po qhs for tremor.  Will use goodrx card to get #30 tabs for $5.19.  Pt verbalized understanding and will let us know how he does.  HT on [place].

## 2018-10-15 NOTE — Telephone Encounter (Signed)
Pt returning call please call back °

## 2018-10-16 ENCOUNTER — Other Ambulatory Visit: Payer: Self-pay | Admitting: Cardiology

## 2018-10-19 ENCOUNTER — Other Ambulatory Visit: Payer: Self-pay

## 2018-10-19 MED ORDER — HYDROCHLOROTHIAZIDE 12.5 MG PO CAPS: 12.5000 mg | ORAL_CAPSULE | Freq: Every day | ORAL | 3 refills | Status: DC

## 2018-10-23 ENCOUNTER — Other Ambulatory Visit: Payer: Self-pay

## 2018-10-23 ENCOUNTER — Encounter: Payer: Self-pay | Admitting: Family Medicine

## 2018-10-23 ENCOUNTER — Ambulatory Visit (INDEPENDENT_AMBULATORY_CARE_PROVIDER_SITE_OTHER): Payer: Medicare HMO | Admitting: Family Medicine

## 2018-10-23 DIAGNOSIS — M1711 Unilateral primary osteoarthritis, right knee: Secondary | ICD-10-CM | POA: Diagnosis not present

## 2018-10-23 NOTE — Patient Instructions (Signed)
Voltaren Gel 2x a day as needed Vitamin D 2000 IU daily Turmeric 500 mg daily Tart cherry extract 1200mg  at night Will get you approved for visco See me again in 4 weeks

## 2018-10-23 NOTE — Assessment & Plan Note (Signed)
Severe OA Discussed HEP Icing patient given an injection today of corticosteroids.  This point I do think patient could be a candidate for Visco supplementation.  Secondary to abnormal thigh to calf ratio and instability of the knee I do believe that patient could be a candidate for an OA stability brace and will see the patient response to that.  Patient will follow-up in 4 weeks and we will discuss the viscosupplementation.

## 2018-10-23 NOTE — Progress Notes (Signed)
[facility] [place] [place], [place] [address] Phone: [phone] Subjective:   I, [name], am serving as a scribe for Dr. Hulan Saas.   CC: Right knee pain  QIH:[id number]  Daniel Everett is a 85 y.o. male coming in with complaint of right knee pain. Paulla Fore patient. Given Zilretta injection on 02/17/2018. Patient states that he doesn't have terrible pain. Joint pain. Chronic. States that he is not sure if zilretta helped his pain. He did not see any significant improvement.  Patient states that there is some instability.  Patient said sometimes that affects him when he cannot walk greater than 500 feet.  Patient rates the severity of pain 9 out of 10.     Past Medical History:  Diagnosis Date  . Allergic rhinitis due to pollen 06/07/2008  . Atrial fibrillation ([place])   . Benign essential HTN   . COLONIC POLYPS, HX OF 10/28/2006  . DIVERTICULOSIS, COLON 11/27/2006  . Gait abnormality 03/02/2018  . GEN OSTEOARTHROSIS INVOLVING MULTIPLE SITES 01/03/2010  . HYPERGLYCEMIA 10/18/2008  . HYPOTHYROIDISM 11/27/2006  . NEOPLASM, MALIGNANT, PROSTATE, HX OF, S/P TURP 05/28/2007  . OSTEOARTHRITIS 11/27/2006  . Peripheral neuropathy 04/08/2018  . Presence of permanent cardiac pacemaker 08/20/2018  . WEIGHT LOSS, RECENT 11/27/2006   Past Surgical History:  Procedure Laterality Date  . arthroscopy rt knee    . HYDROCELE EXCISION / REPAIR    . INSERT / REPLACE / REMOVE PACEMAKER  08/20/2018  . PACEMAKER IMPLANT N/A 08/20/2018   Procedure: PACEMAKER IMPLANT;  Surgeon: Constance Haw, MD;  Location: [facility];  Service: Cardiovascular;  Laterality: N/A;  . PROSTATE SURGERY     TURP  . ROTATOR CUFF REPAIR     Social History   Socioeconomic History  . Marital status: Widowed    Spouse name: Not on file  . Number of children: Not on file  . Years of education: Not on file  . Highest education level: Not on file  Occupational History  .  Occupation: retired  Scientific laboratory technician  . Financial resource strain: Not on file  . Food insecurity    Worry: Not on file    Inability: Not on file  . Transportation needs    Medical: Not on file    Non-medical: Not on file  Tobacco Use  . Smoking status: Never Smoker  . Smokeless tobacco: Never Used  Substance and Sexual Activity  . Alcohol use: No  . Drug use: No  . Sexual activity: Not Currently  Lifestyle  . Physical activity    Days per week: Not on file    Minutes per session: Not on file  . Stress: Not on file  Relationships  . Social Herbalist on phone: Not on file    Gets together: Not on file    Attends religious service: Not on file    Active member of club or organization: Not on file    Attends meetings of clubs or organizations: Not on file    Relationship status: Not on file  Other Topics Concern  . Not on file  Social History Narrative   Lives alone   Caffeine use: very little   Right handed       Spouse is in a nursing home with dementia   She was in 24/7 care at home for 3.5 year   Also has private attendant    She can't talk   3 children  2 in [place] and one in near [place] still goes to church; 'used to play sports   Basketball through college; [employer]; now [employer] in [place]    No Known Allergies Family History  Problem Relation Age of Onset  . Coronary artery disease Mother   . Heart disease Mother   . Pancreatic cancer Father   . Cancer Father   . Tremor Sister     Current Outpatient Medications (Endocrine & Metabolic):  .  levothyroxine (SYNTHROID, LEVOTHROID) 75 MCG tablet, TAKE 1 TABLET DAILY (Patient taking differently: Take 75 mcg by mouth daily before breakfast. )  Current Outpatient Medications (Cardiovascular):  .  amLODipine (NORVASC) 5 MG tablet, Take 1 tablet (5 mg total) by mouth daily. Marland Kitchen  atorvastatin (LIPITOR) 20 MG tablet, Take 1 tablet (20 mg total) by mouth daily. .   hydrochlorothiazide (MICROZIDE) 12.5 MG capsule, Take 1 capsule (12.5 mg total) by mouth daily. Marland Kitchen  lisinopril (ZESTRIL) 20 MG tablet, TAKE 1 TABLET DAILY   Current Outpatient Medications (Analgesics):  .  acetaminophen (TYLENOL) 650 MG CR tablet, Take 650 mg by mouth 2 (two) times a day. .  traMADol (ULTRAM) 50 MG tablet, Take 1 every 6 hours for pain not helped by Tylenol alone  Current Outpatient Medications (Hematological):  .  apixaban (ELIQUIS) 5 MG TABS tablet, Take 1 tablet (5 mg total) by mouth 2 (two) times daily.  Current Outpatient Medications (Other):  .  finasteride (PROSCAR) 5 MG tablet, TAKE 1 TABLET DAILY (Patient taking differently: Take 5 mg by mouth daily. ) .  gabapentin (NEURONTIN) 100 MG capsule, Take 1 capsule (100 mg total) by mouth 2 (two) times daily. (Patient taking differently: Take 200 mg by mouth 2 (two) times daily. ) .  latanoprost (XALATAN) 0.005 % ophthalmic solution, Place 1 drop into both eyes at bedtime.  .  Multiple Vitamin (MULTIVITAMIN WITH MINERALS) TABS tablet, Take 1 tablet by mouth daily. Marland Kitchen  omeprazole (PRILOSEC) 20 MG capsule, TAKE 1 CAPSULE BY MOUTH EVERY DAY .  Pyridoxine HCl (VITAMIN B-6) 500 MG tablet, Take 500 mg by mouth daily. .  timolol (TIMOPTIC) 0.5 % ophthalmic solution, Place 1 drop into both eyes 2 (two) times daily.   Marland Kitchen  topiramate (TOPAMAX) 25 MG tablet, Take 1 tablet at bedtime    Past medical history, social, surgical and family history all reviewed in electronic medical record.  No pertanent information unless stated regarding to the chief complaint.   Review of Systems:  No headache, visual changes, nausea, vomiting, diarrhea, constipation, dizziness, abdominal pain, skin rash, fevers, chills, night sweats, weight loss, swollen lymph nodes, body aches,s, chest pain, shortness of breath, mood changes.  Positive joint swelling and muscle aches  Objective  Blood pressure 118/70, pulse 60, height 6\' 4"  (1.93 m), weight 187 lb  (84.8 kg), SpO2 97 %.   General: No apparent distress alert and oriented x3 mood and affect normal, dressed appropriately.  HEENT: Pupils equal, extraocular movements intact  Respiratory: Patient's speak in full sentences and does not appear short of breath  Cardiovascular: No lower extremity edema, non tender, no erythema  Skin: Warm dry intact with no signs of infection or rash on extremities or on axial skeleton.  Abdomen: Soft nontender  Neuro: Cranial nerves II through XII are intact, neurovascularly intact in all extremities with 2+ DTRs and 2+ pulses.  Lymph: No lymphadenopathy of posterior or anterior cervical chain or axillae bilaterally.  Gait antalgic MSK:  tender with limited range of motion and good stability and symmetric strength and tone of shoulders, elbows, wrist, hip, nd ankles bilaterally.  Knee: Right valgus deformity noted. Large thigh to calf ratio.  Tender to palpation over medial and PF joint line.  ROM full in flexion and extension and lower leg rotation. instability with valgus force.  painful patellar compression. Patellar glide with moderate crepitus. Patellar and quadriceps tendons unremarkable. Hamstring and quadriceps strength is normal. Contralateral knee shows mild arthritic changes but still some mild instability  After informed written and verbal consent, patient was seated on exam table. Right knee was prepped with alcohol swab and utilizing anterolateral approach, patient's right knee space was injected with 4:1  marcaine 0.5%: Kenalog 40mg /dL. Patient tolerated the procedure well without immediate complications. Impression and Recommendations:     This case required medical decision making of moderate complexity. The above documentation has been reviewed and is accurate and complete Lyndal Pulley, DO       Note: This dictation was prepared with Dragon dictation along with smaller phrase technology. Any transcriptional errors that result from  this process are unintentional.

## 2018-10-26 ENCOUNTER — Telehealth: Payer: Self-pay | Admitting: Family Medicine

## 2018-10-26 NOTE — Telephone Encounter (Signed)
Spoke with patient who wanted to know if it was ok to go for a walk. Recommended that he try to work back into whatever distance he was walking before but that it is ok to begin walking. Patient voices understanding.

## 2018-10-26 NOTE — Telephone Encounter (Signed)
Patient saw Dr. Tamala Julian on 10/23/18 and said that they discussed about possible doing exercise for his  R Knee but he never said yes or no, or if she so how long. Patient would like to talk to him or his CMA regarding this. Please call patient back, thanks.

## 2018-10-30 ENCOUNTER — Other Ambulatory Visit: Payer: Self-pay

## 2018-10-30 ENCOUNTER — Ambulatory Visit (INDEPENDENT_AMBULATORY_CARE_PROVIDER_SITE_OTHER): Payer: Medicare HMO | Admitting: Family Medicine

## 2018-10-30 ENCOUNTER — Encounter: Payer: Self-pay | Admitting: Family Medicine

## 2018-10-30 VITALS — BP 116/68 | HR 77 | Temp 97.5°F | Ht 76.0 in | Wt 188.3 lb

## 2018-10-30 DIAGNOSIS — E038 Other specified hypothyroidism: Secondary | ICD-10-CM | POA: Diagnosis not present

## 2018-10-30 DIAGNOSIS — I10 Essential (primary) hypertension: Secondary | ICD-10-CM | POA: Diagnosis not present

## 2018-10-30 DIAGNOSIS — E7800 Pure hypercholesterolemia, unspecified: Secondary | ICD-10-CM

## 2018-10-30 DIAGNOSIS — R498 Other voice and resonance disorders: Secondary | ICD-10-CM

## 2018-10-30 LAB — BASIC METABOLIC PANEL
BUN: 16 mg/dL (ref 6–23)
CO2: 29 mEq/L (ref 19–32)
Calcium: 9.3 mg/dL (ref 8.4–10.5)
Chloride: 100 mEq/L (ref 96–112)
Creatinine, Ser: 0.99 mg/dL (ref 0.40–1.50)
GFR: 71.82 mL/min (ref >60.00)
Glucose, Bld: 120 mg/dL — ABNORMAL HIGH (ref 70–99)
Potassium: 4.3 mEq/L (ref 3.5–5.1)
Sodium: 135 mEq/L (ref 135–145)

## 2018-10-30 LAB — TSH: TSH: 1.59 u[IU]/mL (ref 0.35–4.50)

## 2018-10-30 LAB — HEPATIC FUNCTION PANEL
ALT: 42 U/L (ref 0–53)
AST: 32 U/L (ref 0–37)
Albumin: 4.1 g/dL (ref 3.5–5.2)
Alkaline Phosphatase: 53 U/L (ref 39–117)
Bilirubin, Direct: 0.2 mg/dL (ref 0.0–0.3)
Total Bilirubin: 1 mg/dL (ref 0.2–1.2)
Total Protein: 6.5 g/dL (ref 6.0–8.3)

## 2018-10-30 LAB — LIPID PANEL
Cholesterol: 161 mg/dL (ref 0–200)
HDL: 38 mg/dL — ABNORMAL LOW (ref >39.00)
LDL Cholesterol: 101 mg/dL — ABNORMAL HIGH (ref 0–99)
NonHDL: 122.78
Total CHOL/HDL Ratio: 4
Triglycerides: 108 mg/dL (ref 0.0–149.0)
VLDL: 21.6 mg/dL (ref 0.0–40.0)

## 2018-10-30 NOTE — Patient Instructions (Signed)
Try over the counter Flonase - 2 sprays per nostril once daily.    Let me know if hoarseness not improving over next 2-3 weeks.

## 2018-10-30 NOTE — Progress Notes (Signed)
Subjective:     Patient ID: Daniel Everett, male   DOB: 1932-01-22, 85 y.o.   MRN: [id number]  HPI Patient has chronic problems including hypertension, atrial fibrillation, GERD, hypothyroidism, essential tremor, peripheral neuropathy, osteoarthritis, hyperlipidemia, chronic hoarseness, past history of prostate cancer.  Recent pacemaker insertion.  Doing well from that standpoint.  He has had some mild weight loss since last January about 7 pounds.  His wife who had been in a care facility and debilitated for years passed away in 06-26-2022.  He has done fairly well handling that.  He does have some chronic hoarseness.  Has early morning sneezing and postnasal drip symptoms.  He has GERD and is treated with chronic PPI with omeprazole.  No active GERD symptoms.  He also describes occasional dysphagia but symptoms are very inconsistent.  No pain with swallowing.  Hypothyroidism due for labs.  He has not had lipids in over a year.  Remains on Lipitor.  His blood pressures been stable.  No orthostatic symptoms.  Medications reviewed.  Had been on gabapentin for tremor but did not see any improvement and he took himself off.  Past Medical History:  Diagnosis Date  . Allergic rhinitis due to pollen 06/07/2008  . Atrial fibrillation ([place])   . Benign essential HTN   . COLONIC POLYPS, HX OF 10/28/2006  . DIVERTICULOSIS, COLON 11/27/2006  . Gait abnormality 03/02/2018  . GEN OSTEOARTHROSIS INVOLVING MULTIPLE SITES 01/03/2010  . HYPERGLYCEMIA 10/18/2008  . HYPOTHYROIDISM 11/27/2006  . NEOPLASM, MALIGNANT, PROSTATE, HX OF, S/P TURP 05/28/2007  . OSTEOARTHRITIS 11/27/2006  . Peripheral neuropathy 04/08/2018  . Presence of permanent cardiac pacemaker 08/20/2018  . WEIGHT LOSS, RECENT 11/27/2006   Past Surgical History:  Procedure Laterality Date  . arthroscopy rt knee    . HYDROCELE EXCISION / REPAIR    . INSERT / REPLACE / REMOVE PACEMAKER  08/20/2018  . PACEMAKER IMPLANT N/A 08/20/2018   Procedure: PACEMAKER  IMPLANT;  Surgeon: Constance Haw, MD;  Location: [facility];  Service: Cardiovascular;  Laterality: N/A;  . PROSTATE SURGERY     TURP  . ROTATOR CUFF REPAIR      reports that he has never smoked. He has never used smokeless tobacco. He reports that he does not drink alcohol or use drugs. family history includes Cancer in his father; Coronary artery disease in his mother; Heart disease in his mother; Pancreatic cancer in his father; Tremor in his sister. No Known Allergies   Review of Systems  Constitutional: Positive for fatigue. Negative for appetite change, chills and fever.  HENT: Positive for postnasal drip, sneezing and voice change.   Respiratory: Negative for cough and shortness of breath.   Genitourinary: Negative for dysuria.  Musculoskeletal: Positive for arthralgias.  Neurological: Positive for weakness. Negative for headaches.  Psychiatric/Behavioral: Negative for confusion.       Objective:   Physical Exam Constitutional:      Appearance: Normal appearance.  Neck:     Musculoskeletal: Neck supple.  Cardiovascular:     Rate and Rhythm: Normal rate and regular rhythm.  Pulmonary:     Effort: Pulmonary effort is normal.     Breath sounds: Normal breath sounds.  Abdominal:     General: There is no distension.     Palpations: Abdomen is soft.     Tenderness: There is no abdominal tenderness.  Musculoskeletal:     Right lower leg: No edema.     Left lower leg: No edema.  Lymphadenopathy:  Cervical: No cervical adenopathy.  Skin:    Findings: No rash.  Neurological:     General: No focal deficit present.     Mental Status: He is alert and oriented to person, place, and time.        Assessment:     #1 hoarseness.  This appears to be more chronic.  Possible postnasal drip related.  #2 hypothyroidism  #3 hypertension stable and at goal  #4 hyperlipidemia  #5 balance concerns probably exacerbated by peripheral neuropathy    Plan:      -Check labs with lipid, hepatic, basic metabolic panel, TSH -Try over-the-counter Flonase daily and touch base if hoarseness not improved in 2 to 3 weeks -Consider ENT referral hoarseness not improving -Routine follow-up in 6 months and sooner as needed  [name] MD [facility]

## 2018-11-09 ENCOUNTER — Other Ambulatory Visit: Payer: Self-pay | Admitting: Cardiology

## 2018-11-09 MED ORDER — HYDROCHLOROTHIAZIDE 12.5 MG PO CAPS: 12.5000 mg | ORAL_CAPSULE | Freq: Every day | ORAL | 1 refills | Status: DC

## 2018-11-09 MED ORDER — OMEPRAZOLE 20 MG PO CPDR: DELAYED_RELEASE_CAPSULE | ORAL | 1 refills | Status: DC

## 2018-11-13 ENCOUNTER — Ambulatory Visit: Payer: Medicare HMO | Admitting: Cardiology

## 2018-11-15 ENCOUNTER — Other Ambulatory Visit: Payer: Self-pay | Admitting: Cardiology

## 2018-11-17 DIAGNOSIS — H401111 Primary open-angle glaucoma, right eye, mild stage: Secondary | ICD-10-CM | POA: Diagnosis not present

## 2018-11-17 DIAGNOSIS — H401123 Primary open-angle glaucoma, left eye, severe stage: Secondary | ICD-10-CM | POA: Diagnosis not present

## 2018-11-20 ENCOUNTER — Ambulatory Visit (INDEPENDENT_AMBULATORY_CARE_PROVIDER_SITE_OTHER): Payer: Medicare HMO | Admitting: *Deleted

## 2018-11-20 ENCOUNTER — Ambulatory Visit (INDEPENDENT_AMBULATORY_CARE_PROVIDER_SITE_OTHER): Payer: Medicare HMO | Admitting: Cardiology

## 2018-11-20 ENCOUNTER — Encounter: Payer: Self-pay | Admitting: Cardiology

## 2018-11-20 ENCOUNTER — Other Ambulatory Visit: Payer: Self-pay

## 2018-11-20 VITALS — BP 98/64 | HR 64 | Ht 76.0 in | Wt 190.0 lb

## 2018-11-20 DIAGNOSIS — I4821 Permanent atrial fibrillation: Secondary | ICD-10-CM | POA: Diagnosis not present

## 2018-11-20 DIAGNOSIS — R55 Syncope and collapse: Secondary | ICD-10-CM

## 2018-11-20 LAB — CUP PACEART INCLINIC DEVICE CHECK
Battery Remaining Longevity: 134 mo
Battery Voltage: 3.02 V
Brady Statistic RV Percent Paced: 92 %
Date Time Interrogation Session: 20200828161900
Implantable Lead Implant Date: 20200528
Implantable Lead Location: 753860
Implantable Pulse Generator Implant Date: 20200528
Lead Channel Impedance Value: 575 Ohm
Lead Channel Pacing Threshold Amplitude: 0.625 V
Lead Channel Pacing Threshold Pulse Width: 0.5 ms
Lead Channel Sensing Intrinsic Amplitude: 5.1 mV
Lead Channel Setting Pacing Amplitude: 0.875
Lead Channel Setting Pacing Pulse Width: 0.5 ms
Lead Channel Setting Sensing Sensitivity: 2 mV
Pulse Gen Model: 1272
Pulse Gen Serial Number: 9116714

## 2018-11-20 LAB — CUP PACEART REMOTE DEVICE CHECK
Battery Remaining Longevity: 141 mo
Battery Remaining Percentage: 95.5 %
Battery Voltage: 3.02 V
Brady Statistic RV Percent Paced: 92 %
Date Time Interrogation Session: 20200828060012
Implantable Lead Implant Date: 20200528
Implantable Lead Location: 753860
Implantable Pulse Generator Implant Date: 20200528
Lead Channel Impedance Value: 580 Ohm
Lead Channel Pacing Threshold Amplitude: 0.75 V
Lead Channel Pacing Threshold Pulse Width: 0.5 ms
Lead Channel Sensing Intrinsic Amplitude: 6.5 mV
Lead Channel Setting Pacing Amplitude: 1 V
Lead Channel Setting Pacing Pulse Width: 0.5 ms
Lead Channel Setting Sensing Sensitivity: 2 mV
Pulse Gen Model: 1272
Pulse Gen Serial Number: 9116714

## 2018-11-20 NOTE — Progress Notes (Signed)
Electrophysiology Office Note   Date:  11/20/2018   ID:  ELGAR RAPPAPORT, DOB 1932/02/16, MRN [id number]  PCP:  [name], MD  Cardiologist:  [place] Primary Electrophysiologist:  Constance Haw, MD    No chief complaint on file.    History of Present Illness: Daniel Everett is a 85 y.o. male who is being seen today for the evaluation of atrial fibrillation at the request of Burchette, Alinda Sierras, MD. Presenting today for electrophysiology evaluation.  He has a history of atrial fibrillation and hypertension.  He wore a cardiac monitor in 2018 that showed heart rates in the 20s with up to 3-second pauses.  Bradycardia occurred mainly during sleeping hours.  He had a fall in January down the stairs.  He says that he did not have syncope at that time.  He has had a slow ventricular response with his atrial fibrillation in the past.  He had a Saint Jude single-chamber pacemaker implanted 08/20/2018.  Today, denies symptoms of palpitations, chest pain, shortness of breath, orthopnea, PND, lower extremity edema, claudication, dizziness, presyncope, syncope, bleeding, or neurologic sequela. The patient is tolerating medications without difficulties.     Past Medical History:  Diagnosis Date  . Allergic rhinitis due to pollen 06/07/2008  . Atrial fibrillation ([place])   . Benign essential HTN   . COLONIC POLYPS, HX OF 10/28/2006  . DIVERTICULOSIS, COLON 11/27/2006  . Gait abnormality 03/02/2018  . GEN OSTEOARTHROSIS INVOLVING MULTIPLE SITES 01/03/2010  . HYPERGLYCEMIA 10/18/2008  . HYPOTHYROIDISM 11/27/2006  . NEOPLASM, MALIGNANT, PROSTATE, HX OF, S/P TURP 05/28/2007  . OSTEOARTHRITIS 11/27/2006  . Peripheral neuropathy 04/08/2018  . Presence of permanent cardiac pacemaker 08/20/2018  . WEIGHT LOSS, RECENT 11/27/2006   Past Surgical History:  Procedure Laterality Date  . arthroscopy rt knee    . HYDROCELE EXCISION / REPAIR    . INSERT / REPLACE / REMOVE PACEMAKER  08/20/2018  . PACEMAKER  IMPLANT N/A 08/20/2018   Procedure: PACEMAKER IMPLANT;  Surgeon: Constance Haw, MD;  Location: [facility];  Service: Cardiovascular;  Laterality: N/A;  . PROSTATE SURGERY     TURP  . ROTATOR CUFF REPAIR       Current Outpatient Medications  Medication Sig Dispense Refill  . acetaminophen (TYLENOL) 650 MG CR tablet Take 650 mg by mouth 2 (two) times a day.    Marland Kitchen amLODipine (NORVASC) 5 MG tablet Take 1 tablet (5 mg total) by mouth daily. 90 tablet 3  . apixaban (ELIQUIS) 5 MG TABS tablet Take 1 tablet (5 mg total) by mouth 2 (two) times daily. 180 tablet 3  . atorvastatin (LIPITOR) 20 MG tablet Take 1 tablet (20 mg total) by mouth daily. 90 tablet 3  . finasteride (PROSCAR) 5 MG tablet TAKE 1 TABLET DAILY (Patient taking differently: Take 5 mg by mouth daily. ) 90 tablet 2  . hydrochlorothiazide (MICROZIDE) 12.5 MG capsule Take 1 capsule (12.5 mg total) by mouth daily. 90 capsule 1  . latanoprost (XALATAN) 0.005 % ophthalmic solution Place 1 drop into both eyes at bedtime.     Marland Kitchen levothyroxine (SYNTHROID, LEVOTHROID) 75 MCG tablet TAKE 1 TABLET DAILY (Patient taking differently: Take 75 mcg by mouth daily before breakfast. ) 90 tablet 2  . lisinopril (ZESTRIL) 20 MG tablet TAKE 1 TABLET DAILY 90 tablet 0  . Multiple Vitamin (MULTIVITAMIN WITH MINERALS) TABS tablet Take 1 tablet by mouth daily.    Marland Kitchen omeprazole (PRILOSEC) 20 MG capsule TAKE 1 CAPSULE BY  MOUTH EVERY DAY 90 capsule 1  . Pyridoxine HCl (VITAMIN B-6) 500 MG tablet Take 500 mg by mouth daily.    . timolol (TIMOPTIC) 0.5 % ophthalmic solution Place 1 drop into both eyes 2 (two) times daily.      Marland Kitchen topiramate (TOPAMAX) 25 MG tablet Take 1 tablet at bedtime 60 tablet 3  . traMADol (ULTRAM) 50 MG tablet Take 1 every 6 hours for pain not helped by Tylenol alone 15 tablet 0   No current facility-administered medications for this visit.     Allergies:   Patient has no known allergies.   Social History:  The patient   reports that he has never smoked. He has never used smokeless tobacco. He reports that he does not drink alcohol or use drugs.   Family History:  The patient's family history includes Cancer in his father; Coronary artery disease in his mother; Heart disease in his mother; Pancreatic cancer in his father; Tremor in his sister.    ROS:  Please see the history of present illness.   Otherwise, review of systems is positive for none.   All other systems are reviewed and negative.   PHYSICAL EXAM: VS:  BP 98/64   Pulse 64   Ht 6\' 4"  (1.93 m)   Wt 190 lb (86.2 kg)   SpO2 98%   BMI 23.13 kg/m  , BMI Body mass index is 23.13 kg/m. GEN: Well nourished, well developed, in no acute distress  HEENT: normal  Neck: no JVD, carotid bruits, or masses Cardiac: RRR; no murmurs, rubs, or gallops,no edema  Respiratory:  clear to auscultation bilaterally, normal work of breathing GI: soft, nontender, nondistended, + BS MS: no deformity or atrophy  Skin: warm and dry, device site well healed Neuro:  Strength and sensation are intact Psych: euthymic mood, full affect  EKG:  EKG is ordered today. Personal review of the ekg ordered shows AF, V paced  Personal review of the device interrogation today. Results in [place]: 08/20/2018: Hemoglobin 15.0; Platelets 161 10/30/2018: ALT 42; BUN 16; Creatinine, Ser 0.99; Potassium 4.3; Sodium 135; TSH 1.59    Lipid Panel     Component Value Date/Time   CHOL 161 10/30/2018 0853   TRIG 108.0 10/30/2018 0853   HDL 38.00 (L) 10/30/2018 0853   CHOLHDL 4 10/30/2018 0853   VLDL 21.6 10/30/2018 0853   LDLCALC 101 (H) 10/30/2018 0853     Wt Readings from Last 3 Encounters:  11/20/18 190 lb (86.2 kg)  10/30/18 188 lb 4.8 oz (85.4 kg)  10/23/18 187 lb (84.8 kg)      Other studies Reviewed: Additional studies/ records that were reviewed today include: TTE 04/08/16  Review of the above records today demonstrates:  - Left ventricle: The cavity  size was normal. Wall thickness was   normal. Systolic function was normal. The estimated ejection   fraction was in the range of 55% to 60%. Wall motion was normal;   there were no regional wall motion abnormalities. The study is   not technically sufficient to allow evaluation of LV diastolic   function. - Aortic valve: There was trivial regurgitation. - Mitral valve: There was mild regurgitation. - Left atrium: The atrium was severely dilated. - Right atrium: The atrium was severely dilated. - Atrial septum: No defect or patent foramen ovale was identified. - Tricuspid valve: There was moderate regurgitation.  Holter 04/04/16 - personally reviewed  Atrial fibrillation with slow ventricular response  Rare PVCs  and PVC couplets  Longest pause 3.2 seconds  ASSESSMENT AND PLAN:  1.  Permanent atrial fibrillation: Currently on Eliquis.  Status post Saint Jude single-chamber pacemaker due to slow ventricular response.  Device functioning appropriately.  No changes.  This patients CHA2DS2-VASc Score and unadjusted Ischemic Stroke Rate (% per year) is equal to 3.2 % stroke rate/year from a score of 3  Above score calculated as 1 point each if present [CHF, HTN, DM, Vascular=MI/PAD/Aortic Plaque, Age if 65-74, or Male] Above score calculated as 2 points each if present [Age > 75, or Stroke/TIA/TE]   2.  Hypertension: Currently well controlled    Current medicines are reviewed at length with the patient today.   The patient does not have concerns regarding his medicines.  The following changes were made today: None  Labs/ tests ordered today include:  No orders of the defined types were placed in this encounter.    Disposition:   FU with Kao Berkheimer 12 months  Signed, Zan Triska Meredith Leeds, MD  11/20/2018 2:15 PM     [address] [place] [address] [phone] (office) [phone] (fax)

## 2018-11-20 NOTE — Patient Instructions (Signed)
Medication Instructions:  Your physician recommends that you continue on your current medications as directed. Please refer to the Current Medication list given to you today.  *If you need a refill on your cardiac medications before your next appointment, please call your pharmacy*  Labwork: None ordered  Testing/Procedures: None ordered  Follow-Up: Remote monitoring is used to monitor your Pacemaker or ICD from home. This monitoring reduces the number of office visits required to check your device to one time per year. It allows [place] to keep an eye on the functioning of your device to ensure it is working properly. You are scheduled for a device check from home on 02/17/2019. You may send your transmission at any time that day. If you have a wireless device, the transmission will be sent automatically. After your physician reviews your transmission, you will receive a postcard with your next transmission date.  Your physician wants you to follow-up in: 1 year with Dr. Curt Bears.  You will receive a reminder letter in the mail two months in advance. If you don't receive a letter, please call our office to schedule the follow-up appointment.  Thank you for choosing [facility]!!   Trinidad Curet, RN [phone]

## 2018-11-25 ENCOUNTER — Encounter: Payer: Self-pay | Admitting: Cardiology

## 2018-11-25 NOTE — Progress Notes (Signed)
Remote pacemaker transmission.   

## 2018-11-26 ENCOUNTER — Other Ambulatory Visit: Payer: Self-pay

## 2018-11-26 ENCOUNTER — Other Ambulatory Visit: Payer: Self-pay | Admitting: Family Medicine

## 2018-11-26 ENCOUNTER — Encounter: Payer: Self-pay | Admitting: Family Medicine

## 2018-11-26 ENCOUNTER — Ambulatory Visit (INDEPENDENT_AMBULATORY_CARE_PROVIDER_SITE_OTHER): Payer: Medicare HMO | Admitting: Family Medicine

## 2018-11-26 DIAGNOSIS — M1711 Unilateral primary osteoarthritis, right knee: Secondary | ICD-10-CM

## 2018-11-26 NOTE — Progress Notes (Signed)
[facility] [place], [place] [address] Phone: [phone] Subjective:   [name], am serving as a scribe for Dr. Hulan Saas.   CC: Bilateral knee pain right greater than left  [id number]   10/23/2018 Severe OA Discussed HEP Icing patient given an injection today of corticosteroids.  This point I do think patient could be a candidate for Visco supplementation.  Secondary to abnormal thigh to calf ratio and instability of the knee I do believe that patient could be a candidate for an OA stability brace and will see the patient response to that.  Patient will follow-up in 4 weeks and we will discuss the viscosupplementation.   Update 11/26/2018 Daniel Everett is a 85 y.o. male coming in with complaint of right knee pain. Patient states that injection last visit which helped to reduce some his pain. Notes that his knee feels like it is "slipping."  Patient denies he is very feels like he is following.  And just has more of a unusual feeling.  Has noticed around possibly the swelling is improved and can walk longer distances with less pain.    Past Medical History:  Diagnosis Date   Allergic rhinitis due to pollen 06/07/2008   Atrial fibrillation ([place])    Benign essential HTN    COLONIC POLYPS, HX OF 10/28/2006   DIVERTICULOSIS, COLON 11/27/2006   Gait abnormality 03/02/2018   GEN OSTEOARTHROSIS INVOLVING MULTIPLE SITES 01/03/2010   HYPERGLYCEMIA 10/18/2008   HYPOTHYROIDISM 11/27/2006   NEOPLASM, MALIGNANT, PROSTATE, HX OF, S/P TURP 05/28/2007   OSTEOARTHRITIS 11/27/2006   Peripheral neuropathy 04/08/2018   Presence of permanent cardiac pacemaker 08/20/2018   WEIGHT LOSS, RECENT 11/27/2006   Past Surgical History:  Procedure Laterality Date   arthroscopy rt knee     HYDROCELE EXCISION / REPAIR     INSERT / REPLACE / REMOVE PACEMAKER  08/20/2018   PACEMAKER IMPLANT N/A 08/20/2018   Procedure: PACEMAKER IMPLANT;  Surgeon:  Constance Haw, MD;  Location: [facility];  Service: Cardiovascular;  Laterality: N/A;   PROSTATE SURGERY     TURP   ROTATOR CUFF REPAIR     Social History   Socioeconomic History   Marital status: Widowed    Spouse name: Not on file   Number of children: Not on file   Years of education: Not on file   Highest education level: Not on file  Occupational History   Occupation: retired  Scientist, product/process development strain: Not on file   Food insecurity    Worry: Not on file    Inability: Not on Lexicographer needs    Medical: Not on file    Non-medical: Not on file  Tobacco Use   Smoking status: Never Smoker   Smokeless tobacco: Never Used  Substance and Sexual Activity   Alcohol use: No   Drug use: No   Sexual activity: Not Currently  Lifestyle   Physical activity    Days per week: Not on file    Minutes per session: Not on file   Stress: Not on file  Relationships   Social connections    Talks on phone: Not on file    Gets together: Not on file    Attends religious service: Not on file    Active member of club or organization: Not on file    Attends meetings of clubs or organizations: Not on file    Relationship status:  Not on file  Other Topics Concern   Not on file  Social History Narrative   Lives alone   Caffeine use: very little   Right handed       Spouse is in a nursing home with dementia   She was in 24/7 care at home for 3.5 year   Also has private attendant    She can't talk   3 children    2 in [place] and one in near [place] still goes to church; 'used to play sports   Basketball through college; Education administrator; now [employer] in 59    No Known Allergies Family History  Problem Relation Age of Onset   Coronary artery disease Mother    Heart disease Mother    Pancreatic cancer Father    Cancer Father    Tremor Sister     Current Outpatient Medications (Endocrine &  Metabolic):    levothyroxine (SYNTHROID, LEVOTHROID) 75 MCG tablet, TAKE 1 TABLET DAILY (Patient taking differently: Take 75 mcg by mouth daily before breakfast. )  Current Outpatient Medications (Cardiovascular):    amLODipine (NORVASC) 5 MG tablet, Take 1 tablet (5 mg total) by mouth daily.   atorvastatin (LIPITOR) 20 MG tablet, Take 1 tablet (20 mg total) by mouth daily.   hydrochlorothiazide (MICROZIDE) 12.5 MG capsule, Take 1 capsule (12.5 mg total) by mouth daily.   lisinopril (ZESTRIL) 20 MG tablet, TAKE 1 TABLET DAILY   Current Outpatient Medications (Analgesics):    acetaminophen (TYLENOL) 650 MG CR tablet, Take 650 mg by mouth 2 (two) times a day.   traMADol (ULTRAM) 50 MG tablet, Take 1 every 6 hours for pain not helped by Tylenol alone  Current Outpatient Medications (Hematological):    apixaban (ELIQUIS) 5 MG TABS tablet, Take 1 tablet (5 mg total) by mouth 2 (two) times daily.  Current Outpatient Medications (Other):    finasteride (PROSCAR) 5 MG tablet, TAKE 1 TABLET DAILY (Patient taking differently: Take 5 mg by mouth daily. )   latanoprost (XALATAN) 0.005 % ophthalmic solution, Place 1 drop into both eyes at bedtime.    Multiple Vitamin (MULTIVITAMIN WITH MINERALS) TABS tablet, Take 1 tablet by mouth daily.   omeprazole (PRILOSEC) 20 MG capsule, TAKE 1 CAPSULE BY MOUTH EVERY DAY   Pyridoxine HCl (VITAMIN B-6) 500 MG tablet, Take 500 mg by mouth daily.   timolol (TIMOPTIC) 0.5 % ophthalmic solution, Place 1 drop into both eyes 2 (two) times daily.     topiramate (TOPAMAX) 25 MG tablet, Take 1 tablet at bedtime    Past medical history, social, surgical and family history all reviewed in electronic medical record.  No pertanent information unless stated regarding to the chief complaint.   Review of Systems:  No headache, visual changes, nausea, vomiting, diarrhea, constipation, dizziness, abdominal pain, skin rash, fevers, chills, night sweats, weight  loss, swollen lymph nodes, body aches, joint swelling, muscle aches, chest pain, shortness of breath, mood changes.   Objective  There were no vitals taken for this visit. Systems examined below as of    General: No apparent distress alert and oriented x3 mood and affect normal, dressed appropriately.  HEENT: Pupils equal, extraocular movements intact  Respiratory: Patient's speak in full sentences and does not appear short of breath  Cardiovascular: No lower extremity edema, non tender, no erythema  Skin: Warm dry intact with no signs of infection or rash on extremities or on axial skeleton.  Abdomen: Soft nontender  Neuro: Cranial nerves II through XII are intact, neurovascularly intact in all extremities with 2+ DTRs and 2+ pulses.  Lymph: No lymphadenopathy of posterior or anterior cervical chain or axillae bilaterally.  Gait antalgic MSK:  tender with full range of motion and good stability and symmetric strength and tone of shoulders, elbows, wrist, hip, and ankles bilaterally.  Arthritic changes of multiple joints Knee: Right valgus deformity noted.  Abnormal thigh to calf ratio.  Tender to palpation over medial and PF joint line.  ROM full in flexion and extension and lower leg rotation. instability with valgus force.  painful patellar compression. Patellar glide with moderate crepitus. Patellar and quadriceps tendons unremarkable. Hamstring and quadriceps strength is normal. Contralateral knee shows mild arthritic changes but no significant instability   Impression and Recommendations:     This case required medical decision making of moderate complexity. The above documentation has been reviewed and is accurate and complete Lyndal Pulley, DO       Note: This dictation was prepared with Dragon dictation along with smaller phrase technology. Any transcriptional errors that result from this process are unintentional.

## 2018-11-26 NOTE — Patient Instructions (Addendum)
Good to see you Ask other physician about Vitamin D 2000IU and CoQ10 200mg  daily See me again 2 months

## 2018-11-26 NOTE — Assessment & Plan Note (Signed)
Patient elected to continue with conservative therapy at this time but secondary to the instability and the abnormal thigh to calf ratio patient will be fitted for custom brace.  Patient does have some underlying peripheral neuropathy that could also be contributing.  Discussed viscosupplementation but with patient improvement with the steroid injection he wants to continue without it at the moment.  Follow-up with me again 6 to 8 weeks

## 2018-12-08 ENCOUNTER — Other Ambulatory Visit: Payer: Self-pay

## 2018-12-08 ENCOUNTER — Encounter: Payer: Self-pay | Admitting: Family Medicine

## 2018-12-08 ENCOUNTER — Ambulatory Visit (INDEPENDENT_AMBULATORY_CARE_PROVIDER_SITE_OTHER): Payer: Medicare HMO | Admitting: Family Medicine

## 2018-12-08 VITALS — BP 122/80 | HR 81 | Temp 97.4°F | Ht 76.0 in | Wt 195.0 lb

## 2018-12-08 DIAGNOSIS — E038 Other specified hypothyroidism: Secondary | ICD-10-CM

## 2018-12-08 DIAGNOSIS — I4821 Permanent atrial fibrillation: Secondary | ICD-10-CM | POA: Diagnosis not present

## 2018-12-08 DIAGNOSIS — Z23 Encounter for immunization: Secondary | ICD-10-CM

## 2018-12-08 DIAGNOSIS — I10 Essential (primary) hypertension: Secondary | ICD-10-CM | POA: Diagnosis not present

## 2018-12-08 DIAGNOSIS — R490 Dysphonia: Secondary | ICD-10-CM

## 2018-12-08 DIAGNOSIS — E7800 Pure hypercholesterolemia, unspecified: Secondary | ICD-10-CM

## 2018-12-08 NOTE — Patient Instructions (Signed)
Hoarseness  Hoarseness, also called dysphonia, is any abnormal change in your voice that can make it difficult to speak. Your voice may sound raspy, breathy, or strained. Hoarseness is caused by a problem with your vocal cords (vocal folds). These are two bands of tissue inside your voice box (larynx). When you speak, your vocal cords move back and forth to create sound. The surfaces of your vocal cords need to be smooth for your voice to sound clear. Swelling or lumps on your vocal cords can cause hoarseness. Common causes of vocal cord problems include:  Infection in the nose, throat, and upper air passages (upper respiratory infection).  A long-term cough.  Straining or overusing your voice.  Smoking, or exposure to secondhand smoke.  Allergies.  Medication side effects.  Vocal cord growths.  Vocal cord injuries.  Stomach acids that move up in your throat and irritate your vocal cords (gastroesophageal reflux).  Diseases that affect the nervous system, such as a stroke or Parkinson's disease. Follow these instructions at home: Watch your condition for any changes. To ease discomfort and protect your vocal cords:  Rest your voice.  Do not whisper. Whispering can cause muscle strain.  Do not speak in a loud or harsh voice.  Avoid coughing or clearing your throat.  Do not use any products that contain nicotine or tobacco, such as cigarettes and e-cigarettes. If you need help quitting, ask your health care provider.  Avoid secondhand smoke.  Do not eat foods that give you heartburn, such as spicy or acidic foods like hot peppers and orange juice. Heartburn can make gastroesophageal reflux worse.  Do not drink beverages that contain caffeine (coffee, tea, or soft drinks) or alcohol (beer, wine, or liquor).  Drink enough fluid to keep your urine pale yellow.  Use a humidifier if the air in your home is dry. If recommended by your health care provider, schedule an  appointment with a speech-language specialist. This specialist may give you methods to try that can help you avoid misusing your voice. Contact a health care provider if:  You have hoarseness that lasts longer than 3 weeks.  You almost lose or completely lose your voice for more than 3 days.  You have pain when you swallow or try to talk.  You feel a lump in your neck. Get help right away if:  You have trouble swallowing.  You feel like you are choking when you swallow.  You cough up blood or vomit blood.  You have trouble breathing.  You choke, cannot swallow, or cannot breathe if you lie flat.  You notice swelling or a rash on your body, face, or tongue. Summary  Hoarseness, also called dysphonia, is any abnormal change in your voice that can make it difficult to speak. Your voice may sound raspy, breathy, or strained.  Hoarseness is caused by a problem with your vocal cords (vocal folds).  Do not speak in a loud or harsh voice, use nicotine or tobacco products, or eat foods that give you heartburn.  If recommended by your health care provider, meet with a speech-language specialist. This information is not intended to replace advice given to you by your health care provider. Make sure you discuss any questions you have with your health care provider. Document Released: 02/22/2005 Document Revised: 02/21/2017 Document Reviewed: 12/06/2016 Elsevier Patient Education  2020 [employer].

## 2018-12-08 NOTE — Progress Notes (Signed)
Subjective:     Patient ID: Daniel Everett, male   DOB: 1931-09-03, 85 y.o.   MRN: [id number]  HPI  Patient is seen for follow-up regarding hoarseness.  Refer to prior note.  He does have a history of GERD but takes omeprazole regularly.  He was describing some early morning sneezing and postnasal drip symptoms.  Recommended regular Flonase but he has not seen any improvement.  He has never smoked.  Does have occasional mild dysphagia but symptoms are very infrequent.  No appetite or weight changes.  No pain with swallowing.  His hoarseness has been several months duration.  Hypothyroidism.  Recent TSH at goal.  His blood pressure remains stable.  Recent lipids were stable.  He does complain of some generalized achiness.  Sports medicine physician had recommended vitamin D and coenzyme Q 10.  He has not yet started these.  He needs flu vaccine.  Past Medical History:  Diagnosis Date  . Allergic rhinitis due to pollen 06/07/2008  . Atrial fibrillation ([place])   . Benign essential HTN   . COLONIC POLYPS, HX OF 10/28/2006  . DIVERTICULOSIS, COLON 11/27/2006  . Gait abnormality 03/02/2018  . GEN OSTEOARTHROSIS INVOLVING MULTIPLE SITES 01/03/2010  . HYPERGLYCEMIA 10/18/2008  . HYPOTHYROIDISM 11/27/2006  . NEOPLASM, MALIGNANT, PROSTATE, HX OF, S/P TURP 05/28/2007  . OSTEOARTHRITIS 11/27/2006  . Peripheral neuropathy 04/08/2018  . Presence of permanent cardiac pacemaker 08/20/2018  . WEIGHT LOSS, RECENT 11/27/2006   Past Surgical History:  Procedure Laterality Date  . arthroscopy rt knee    . HYDROCELE EXCISION / REPAIR    . INSERT / REPLACE / REMOVE PACEMAKER  08/20/2018  . PACEMAKER IMPLANT N/A 08/20/2018   Procedure: PACEMAKER IMPLANT;  Surgeon: Constance Haw, MD;  Location: [facility];  Service: Cardiovascular;  Laterality: N/A;  . PROSTATE SURGERY     TURP  . ROTATOR CUFF REPAIR      reports that he has never smoked. He has never used smokeless tobacco. He reports that he does not  drink alcohol or use drugs. family history includes Cancer in his father; Coronary artery disease in his mother; Heart disease in his mother; Pancreatic cancer in his father; Tremor in his sister. No Known Allergies  Review of Systems  Constitutional: Negative for appetite change, chills, fatigue, fever and unexpected weight change.  HENT: Positive for postnasal drip and voice change. Negative for sore throat.   Eyes: Negative for visual disturbance.  Respiratory: Negative for cough, chest tightness and shortness of breath.   Cardiovascular: Negative for chest pain, palpitations and leg swelling.  Genitourinary: Negative for dysuria.  Musculoskeletal: Positive for arthralgias.  Neurological: Negative for dizziness, syncope, weakness, light-headedness and headaches.       Objective:   Physical Exam Constitutional:      Appearance: Normal appearance.  Neck:     Musculoskeletal: Neck supple.     Comments: No thyroid mass Cardiovascular:     Rate and Rhythm: Normal rate and regular rhythm.  Pulmonary:     Effort: Pulmonary effort is normal.     Breath sounds: Normal breath sounds.  Lymphadenopathy:     Cervical: No cervical adenopathy.  Neurological:     Mental Status: He is alert.        Assessment:     #1 chronic hoarseness.  He does have history of GERD and some postnasal drip but symptoms not improved with omeprazole and Flonase.  No red flags such as appetite change or weight loss  #  2 hypothyroidism-on replacement and at goal  #3 hypertension stable and at goal  #4 hyperlipidemia stable by recent labs.  He has had some myalgias/arthralgias but doubt statin related  #5 history of chronic atrial fibrillation maintained on Eliquis-which he gets through the [place]:     -Set up ENT referral for his hoarseness evaluation -Flu vaccine given -Routine follow-up in 6 months and sooner as needed -Suggest that he start vitamin D 2000 international units daily  [name] MD [facility]

## 2018-12-08 NOTE — Addendum Note (Signed)
Addended by: Anibal Henderson on: 12/08/2018 09:19 AM   Modules accepted: Orders

## 2018-12-09 DIAGNOSIS — R490 Dysphonia: Secondary | ICD-10-CM | POA: Diagnosis not present

## 2018-12-09 DIAGNOSIS — R1313 Dysphagia, pharyngeal phase: Secondary | ICD-10-CM | POA: Diagnosis not present

## 2018-12-11 ENCOUNTER — Telehealth: Payer: Self-pay | Admitting: Cardiology

## 2018-12-11 NOTE — Telephone Encounter (Signed)
I spoke with the pt and had him send a manual transmission with his home monitor for the device nurse to look at it. Transmission received. I told the pt the nurse will look at his transmission and give him a call back. The pt verbalized understanding.

## 2018-12-11 NOTE — Telephone Encounter (Signed)
Remote transmission showed no events or issues with PPM. Patient describes tingling sensation and numbness  in the left side of his neck and his left shoulder near PPM. No CP, SOB or tingling in left arm or left side below pacemaker.Suggested that patient contact PCP to be evaluated.

## 2018-12-11 NOTE — Telephone Encounter (Signed)
°  Patient called and states he occasionally has a tingling or numbing sensation in the general area in his neck and shoulder where the pacemaker is. He states the feeling is not constant, and he is not in pain. When he feels this sensation, it does not last very long.  He knows that this is not a normal feeling, and there is nothing else in that area that may be causing the feeling. He just wanted to run this past Dr. Curt Bears and his team to see what he has to say.

## 2018-12-13 ENCOUNTER — Other Ambulatory Visit: Payer: Self-pay | Admitting: Cardiology

## 2018-12-24 ENCOUNTER — Ambulatory Visit: Payer: Medicare HMO | Admitting: Neurology

## 2018-12-29 ENCOUNTER — Other Ambulatory Visit: Payer: Self-pay | Admitting: Otolaryngology

## 2018-12-29 DIAGNOSIS — D2239 Melanocytic nevi of other parts of face: Secondary | ICD-10-CM | POA: Diagnosis not present

## 2018-12-29 DIAGNOSIS — D034 Melanoma in situ of scalp and neck: Secondary | ICD-10-CM | POA: Diagnosis not present

## 2018-12-29 DIAGNOSIS — D485 Neoplasm of uncertain behavior of skin: Secondary | ICD-10-CM | POA: Diagnosis not present

## 2018-12-29 DIAGNOSIS — L570 Actinic keratosis: Secondary | ICD-10-CM | POA: Diagnosis not present

## 2018-12-29 DIAGNOSIS — R238 Other skin changes: Secondary | ICD-10-CM | POA: Diagnosis not present

## 2018-12-29 DIAGNOSIS — Z85828 Personal history of other malignant neoplasm of skin: Secondary | ICD-10-CM | POA: Diagnosis not present

## 2018-12-29 DIAGNOSIS — L905 Scar conditions and fibrosis of skin: Secondary | ICD-10-CM | POA: Diagnosis not present

## 2019-01-03 ENCOUNTER — Other Ambulatory Visit: Payer: Self-pay | Admitting: Cardiology

## 2019-01-05 ENCOUNTER — Other Ambulatory Visit: Payer: Self-pay

## 2019-01-05 ENCOUNTER — Encounter: Payer: Self-pay | Admitting: Neurology

## 2019-01-05 ENCOUNTER — Ambulatory Visit: Payer: Medicare HMO | Admitting: Neurology

## 2019-01-05 ENCOUNTER — Other Ambulatory Visit (HOSPITAL_COMMUNITY): Payer: Self-pay | Admitting: *Deleted

## 2019-01-05 VITALS — BP 136/97 | HR 67 | Temp 97.6°F | Ht 76.0 in | Wt 195.3 lb

## 2019-01-05 DIAGNOSIS — G459 Transient cerebral ischemic attack, unspecified: Secondary | ICD-10-CM

## 2019-01-05 DIAGNOSIS — R1312 Dysphagia, oropharyngeal phase: Secondary | ICD-10-CM | POA: Diagnosis not present

## 2019-01-05 DIAGNOSIS — G603 Idiopathic progressive neuropathy: Secondary | ICD-10-CM

## 2019-01-05 DIAGNOSIS — G250 Essential tremor: Secondary | ICD-10-CM

## 2019-01-05 DIAGNOSIS — R1310 Dysphagia, unspecified: Secondary | ICD-10-CM

## 2019-01-05 MED ORDER — TOPIRAMATE 25 MG PO TABS: ORAL_TABLET | ORAL | 3 refills | Status: DC

## 2019-01-05 NOTE — Telephone Encounter (Signed)
This is Dr. Jordan's pt. °

## 2019-01-05 NOTE — Progress Notes (Signed)
Reason for visit: Tremor, peripheral neuropathy  Daniel Everett is an 84 y.o. male  History of present illness:  Daniel Everett is an 85 year old right-handed white male with a history of essential tremors affecting both upper extremities.  He has difficulty performing tasks that require fine motor control and with handwriting.  The patient was given a brief trial on very low-dose gabapentin taking 200 mg twice daily, he did not get benefit with this and was switched off to very low-dose Topamax taking 25 mg at night.  He did not get any benefit with this low-dose, he stopped the medication 2 weeks ago.  He cannot take Mysoline as he is on Eliquis as a blood thinner.  He has been having episodes almost daily with transient numbness of the left upper chest and left neck up to the ear.  This will come and go without associated chest pain or pressure, arm numbness, or shortness of breath.  There is no pain associated with this event.  He is also reporting episodes of solid food getting stuck in the throat, he is concerned about this and wishes to have further evaluation.  He has a peripheral neuropathy, he does have some gait instability, he reports no recent falls, he did have a fall in January 2020.  He had a pacemaker placed in May 2020.  He returns for an evaluation.  Past Medical History:  Diagnosis Date  . Allergic rhinitis due to pollen 06/07/2008  . Atrial fibrillation ([place])   . Benign essential HTN   . COLONIC POLYPS, HX OF 10/28/2006  . DIVERTICULOSIS, COLON 11/27/2006  . Gait abnormality 03/02/2018  . GEN OSTEOARTHROSIS INVOLVING MULTIPLE SITES 01/03/2010  . HYPERGLYCEMIA 10/18/2008  . HYPOTHYROIDISM 11/27/2006  . NEOPLASM, MALIGNANT, PROSTATE, HX OF, S/P TURP 05/28/2007  . OSTEOARTHRITIS 11/27/2006  . Peripheral neuropathy 04/08/2018  . Presence of permanent cardiac pacemaker 08/20/2018  . WEIGHT LOSS, RECENT 11/27/2006    Past Surgical History:  Procedure Laterality Date  . arthroscopy rt  knee    . HYDROCELE EXCISION / REPAIR    . INSERT / REPLACE / REMOVE PACEMAKER  08/20/2018  . PACEMAKER IMPLANT N/A 08/20/2018   Procedure: PACEMAKER IMPLANT;  Surgeon: Constance Haw, MD;  Location: [facility];  Service: Cardiovascular;  Laterality: N/A;  . PROSTATE SURGERY     TURP  . ROTATOR CUFF REPAIR      Family History  Problem Relation Age of Onset  . Coronary artery disease Mother   . Heart disease Mother   . Pancreatic cancer Father   . Cancer Father   . Tremor Sister     Social history:  reports that he has never smoked. He has never used smokeless tobacco. He reports that he does not drink alcohol or use drugs.   No Known Allergies  Medications:  Prior to Admission medications   Medication Sig Start Date End Date Taking? Authorizing Provider  acetaminophen (TYLENOL) 650 MG CR tablet Take 650 mg by mouth 2 (two) times a day.   Yes [provider]  amLODipine (NORVASC) 5 MG tablet Take 1 tablet (5 mg total) by mouth daily. 07/20/18  Yes Martinique, Peter M, MD  apixaban (ELIQUIS) 5 MG TABS tablet Take 1 tablet (5 mg total) by mouth 2 (two) times daily. 05/15/17  Yes Martinique, Peter M, MD  atorvastatin (LIPITOR) 20 MG tablet Take 1 tablet (20 mg total) by mouth daily. 05/13/18  Yes Martinique, Peter M, MD  diclofenac sodium (  VOLTAREN) 1 % GEL Apply topically 4 (four) times daily.   Yes [provider]  finasteride (PROSCAR) 5 MG tablet TAKE 1 TABLET DAILY Patient taking differently: Take 5 mg by mouth daily.  03/02/18  Yes Burchette, Alinda Sierras, MD  hydrochlorothiazide (MICROZIDE) 12.5 MG capsule Take 1 capsule (12.5 mg total) by mouth daily. 11/09/18 02/07/19 Yes Martinique, Peter M, MD  latanoprost (XALATAN) 0.005 % ophthalmic solution Place 1 drop into both eyes at bedtime.    Yes [provider]  levothyroxine (SYNTHROID) 75 MCG tablet TAKE 1 TABLET DAILY 12/01/18  Yes Burchette, Alinda Sierras, MD  lisinopril (ZESTRIL) 20 MG tablet TAKE 1 TABLET DAILY  12/14/18  Yes Martinique, Peter M, MD  Misc Natural Products (TART CHERRY ADVANCED PO) Take 1,200 mg by mouth.   Yes [provider]  Multiple Vitamin (MULTIVITAMIN WITH MINERALS) TABS tablet Take 1 tablet by mouth daily.   Yes [provider]  omeprazole (PRILOSEC) 20 MG capsule TAKE 1 CAPSULE BY MOUTH EVERY DAY 11/09/18  Yes Martinique, Peter M, MD  Pyridoxine HCl (VITAMIN B-6) 500 MG tablet Take 500 mg by mouth daily.   Yes [provider]  timolol (TIMOPTIC) 0.5 % ophthalmic solution Place 1 drop into both eyes 2 (two) times daily.     Yes [provider]  traMADol (ULTRAM) 50 MG tablet Take 1 every 6 hours for pain not helped by Tylenol alone 04/10/18  Yes Milton Ferguson, MD  Turmeric 500 MG CAPS Take by mouth.   Yes [provider]  VITAMIN D PO Take 2,000 Units by mouth.   Yes [provider]  topiramate (TOPAMAX) 25 MG tablet Take 1 tablet at bedtime Patient not taking: Reported on 01/05/2019 10/15/18   Suzzanne Cloud, NP    ROS:  Out of a complete 14 system review of symptoms, the patient complains only of the following symptoms, and all other reviewed systems are negative.  Tremor Episodic numbness  Blood pressure (!) 136/97, pulse 67, temperature 97.6 F (36.4 C), temperature source Temporal, height 6\' 4"  (1.93 m), weight 195 lb 5 oz (88.6 kg).  Physical Exam  General: The patient is alert and cooperative at the time of the examination.  Skin: No significant peripheral edema is noted.   Neurologic Exam  Mental status: The patient is alert and oriented x 3 at the time of the examination. The patient has apparent normal recent and remote memory, with an apparently normal attention span and concentration ability.   Cranial nerves: Facial symmetry is present. Speech is normal, no aphasia or dysarthria is noted. Extraocular movements are full. Visual fields are full.  Motor: The patient has good strength in all 4 extremities.   Sensory examination: Soft touch sensation is symmetric on the face, arms, and legs.  Coordination: The patient has good finger-nose-finger and heel-to-shin bilaterally.  The patient does have an intention tremor with finger-nose-finger bilaterally.  When drawing a spiral, the tremor is translated to some degree and handwriting.  Gait and station: The patient has a minimally wide-based gait.  Tandem gait is unsteady.  Romberg is negative.  Reflexes: Deep tendon reflexes are symmetric, but are depressed.   Assessment/Plan:  1.  Peripheral neuropathy, mild gait instability  2.  Essential tremor  3.  Reports of swallowing difficulty  4.  Episodic left neck, face, and shoulder numbness, etiology unclear  The patient will be sent for carotid Doppler study to exclude the possibility of TIA type events leading to the left  shoulder and neck numbness.  The patient will be set up for a modified barium swallow for the swallowing issues.  The patient may have been a large vallecula that is trapping food.  The patient will be placed back on Topamax taking 25 mg twice daily for 2 weeks and then go to 50 mg twice daily.  He will call for any dose adjustments.  He will follow-up otherwise in 6 months.  Jill Alexanders MD 01/05/2019 10:11 AM  [facility] [address] [place], [place] [address]  Phone [phone] Fax [phone]

## 2019-01-05 NOTE — Patient Instructions (Signed)
We will go up on the Topamax to 25 mg twice a day for 2 weeks, then take 50 mg twice a day.   Topamax (topiramate) is a seizure medication that has an FDA approval for seizures and for migraine headache. Potential side effects of this medication include weight loss, cognitive slowing, tingling in the fingers and toes, and carbonated drinks will taste bad. If any significant side effects are noted on this drug, please contact our office.

## 2019-01-07 NOTE — Progress Notes (Signed)
Cardiology Office Note   Date:  01/14/2019   ID:  Daniel Everett, DOB 11-17-1931, MRN [id number]  PCP:  [name], MD  Cardiologist:  Dr Martinique   Martinique, MD 03/27/2016   History of Present Illness: Daniel Everett is a 85 y.o. male with a history of hypothyroid, hyperglycemia, prostate CA s/p TURP, OA, colon polyps, HTN, RBBB- seen for follow up of Atrial fibrillation.  He was initially seen by Rosaria Ferries PA-C in January 2018 for atrial fibrillation with  HR 50s at baseline, on Xarelto. On no AV nodal blocking agents. Holter ordered. BP up, HCTZ added. BMET 1 week later was ok. Holter w/ no pauses > 3.2 sec, HR generally slow, 52 avg, lowest 21 at 5 am. Echo showed normal EF. Mild MR, mod TR. Severe biatrial enlargement.   He has been seen by Dr. Jannifer Franklin in January for evaluation of tremor, gait instability.  EMG c/w peripheral neuropathy. Other lab studies normal. He did have a fall in January ? Near syncope. Was noted to have marked bradycardia with Afib and he subsequently  had a [place] single-chamber pacemaker implanted 08/20/2018. Followed by Dr Curt Bears.   He is now feeling ok. Notes some memory loss. Still has problems with his balance. No chest pain, dyspnea, dizziness.      Past Medical History:  Diagnosis Date  . Allergic rhinitis due to pollen 06/07/2008  . Atrial fibrillation ([place])   . Benign essential HTN   . COLONIC POLYPS, HX OF 10/28/2006  . DIVERTICULOSIS, COLON 11/27/2006  . Gait abnormality 03/02/2018  . GEN OSTEOARTHROSIS INVOLVING MULTIPLE SITES 01/03/2010  . HYPERGLYCEMIA 10/18/2008  . HYPOTHYROIDISM 11/27/2006  . NEOPLASM, MALIGNANT, PROSTATE, HX OF, S/P TURP 05/28/2007  . OSTEOARTHRITIS 11/27/2006  . Peripheral neuropathy 04/08/2018  . Presence of permanent cardiac pacemaker 08/20/2018  . WEIGHT LOSS, RECENT 11/27/2006    Past Surgical History:  Procedure Laterality Date  . arthroscopy rt knee    . HYDROCELE EXCISION / REPAIR    . INSERT /  REPLACE / REMOVE PACEMAKER  08/20/2018  . PACEMAKER IMPLANT N/A 08/20/2018   Procedure: PACEMAKER IMPLANT;  Surgeon: Constance Haw, MD;  Location: [facility];  Service: Cardiovascular;  Laterality: N/A;  . PROSTATE SURGERY     TURP  . ROTATOR CUFF REPAIR      Current Outpatient Medications  Medication Sig Dispense Refill  . acetaminophen (TYLENOL) 650 MG CR tablet Take 650 mg by mouth 2 (two) times a day.    Marland Kitchen amLODipine (NORVASC) 5 MG tablet Take 1 tablet (5 mg total) by mouth daily. 90 tablet 3  . apixaban (ELIQUIS) 5 MG TABS tablet Take 1 tablet (5 mg total) by mouth 2 (two) times daily. 180 tablet 3  . atorvastatin (LIPITOR) 20 MG tablet TAKE 1 TABLET DAILY 90 tablet 3  . diclofenac sodium (VOLTAREN) 1 % GEL Apply topically 4 (four) times daily.    . finasteride (PROSCAR) 5 MG tablet TAKE 1 TABLET DAILY (Patient taking differently: Take 5 mg by mouth daily. ) 90 tablet 2  . hydrochlorothiazide (MICROZIDE) 12.5 MG capsule Take 1 capsule (12.5 mg total) by mouth daily. 90 capsule 1  . latanoprost (XALATAN) 0.005 % ophthalmic solution Place 1 drop into both eyes at bedtime.     Marland Kitchen levothyroxine (SYNTHROID) 75 MCG tablet TAKE 1 TABLET DAILY 90 tablet 2  . lisinopril (ZESTRIL) 20 MG tablet TAKE 1 TABLET DAILY 90 tablet 2  . Misc Natural  Products (TART CHERRY ADVANCED PO) Take 1,200 mg by mouth.    . Multiple Vitamin (MULTIVITAMIN WITH MINERALS) TABS tablet Take 1 tablet by mouth daily.    Marland Kitchen omeprazole (PRILOSEC) 20 MG capsule TAKE 1 CAPSULE BY MOUTH EVERY DAY 90 capsule 1  . Pyridoxine HCl (VITAMIN B-6) 500 MG tablet Take 500 mg by mouth daily.    . timolol (TIMOPTIC) 0.5 % ophthalmic solution Place 1 drop into both eyes 2 (two) times daily.      Marland Kitchen topiramate (TOPAMAX) 25 MG tablet One tablet twice a day for 2 weeks, then take 2 tablets twice a day 120 tablet 3  . traMADol (ULTRAM) 50 MG tablet Take 1 every 6 hours for pain not helped by Tylenol alone 15 tablet 0  . Turmeric  500 MG CAPS Take by mouth.    Marland Kitchen VITAMIN D PO Take 2,000 Units by mouth.     No current facility-administered medications for this visit.     Allergies:   Patient has no known allergies.    Social History:  The patient  reports that he has never smoked. He has never used smokeless tobacco. He reports that he does not drink alcohol or use drugs.   Family History:  The patient's family history includes Cancer in his father; Coronary artery disease in his mother; Heart disease in his mother; Pancreatic cancer in his father; Tremor in his sister.    ROS:  Please see the history of present illness. All other systems are reviewed and negative.    PHYSICAL EXAM: VS:  BP 120/88 (BP Location: Left Arm, Patient Position: Sitting, Cuff Size: Normal)   Pulse 64   Temp (!) 97 F (36.1 C)   Ht 6\' 4"  (1.93 m)   Wt 194 lb (88 kg)   BMI 23.61 kg/m  , BMI Body mass index is 23.61 kg/m. GENERAL:  Well appearing, elderly WM in NAD HEENT:  PERRL, EOMI, sclera are clear. Oropharynx is clear. NECK:  No jugular venous distention, carotid upstroke brisk and symmetric, no bruits, no thyromegaly or adenopathy LUNGS:  Clear to auscultation bilaterally CHEST:  Unremarkable HEART:  RRR,    PMI not displaced or sustained,S1 and S2 within normal limits, no S3, no S4: no clicks, no rubs, no murmurs ABD:  Soft, nontender. BS +, no masses or bruits. No hepatomegaly, no splenomegaly EXT:  2 + pulses throughout, no edema, no cyanosis no clubbing SKIN:  Warm and dry.  No rashes NEURO:  Alert and oriented x 3. Cranial nerves II through XII intact. PSYCH:  Cognitively intact   Recent Labs: 08/20/2018: Hemoglobin 15.0; Platelets 161 10/30/2018: ALT 42; BUN 16; Creatinine, Ser 0.99; Potassium 4.3; Sodium 135; TSH 1.59    Lipid Panel    Component Value Date/Time   CHOL 161 10/30/2018 0853   TRIG 108.0 10/30/2018 0853   HDL 38.00 (L) 10/30/2018 0853   CHOLHDL 4 10/30/2018 0853   VLDL 21.6 10/30/2018 0853    LDLCALC 101 (H) 10/30/2018 0853     Wt Readings from Last 3 Encounters:  01/14/19 194 lb (88 kg)  01/05/19 195 lb 5 oz (88.6 kg)  12/08/18 195 lb (88.5 kg)     Other studies Reviewed: Additional studies/ records that were reviewed today include: office notes and testing.  Holter 04/04/16: Study Highlights    Atrial fibrillation with slow ventricular response  Rare PVCs and PVC couplets  Longest pause 3.2 seconds    Echo 04/08/16: Study Conclusions  - Left ventricle:  The cavity size was normal. Wall thickness was   normal. Systolic function was normal. The estimated ejection   fraction was in the range of 55% to 60%. Wall motion was normal;   there were no regional wall motion abnormalities. The study is   not technically sufficient to allow evaluation of LV diastolic   function. - Aortic valve: There was trivial regurgitation. - Mitral valve: There was mild regurgitation. - Left atrium: The atrium was severely dilated. - Right atrium: The atrium was severely dilated. - Atrial septum: No defect or patent foramen ovale was identified. - Tricuspid valve: There was moderate regurgitation.  ASSESSMENT AND PLAN:  1.  Atrial fib- now chronic with slow ventricular response. Apparent syncopal episode in January. S/p PPM placement in May. Clinically doing well. Continue Eliquis.   2. Chronic anticoag: CHA2DS2VASc=3 (age x 2, HTN). Continue Eliquis.   3. HTN- controlled.  4. Peripheral Neuropathy. Followed by Dr. Ollen Barges s.   Current medicines are reviewed at length with the patient today.  The patient has concerns regarding medicines. Concerns were addressed.  The following changes have been made:  no change  Labs/ tests ordered today include:   No orders of the defined types were placed in this encounter.    Signed,  Martinique, MD  01/14/2019 9:18 AM    [facility]

## 2019-01-08 DIAGNOSIS — Z7901 Long term (current) use of anticoagulants: Secondary | ICD-10-CM | POA: Insufficient documentation

## 2019-01-08 DIAGNOSIS — C434 Malignant melanoma of scalp and neck: Secondary | ICD-10-CM | POA: Diagnosis not present

## 2019-01-08 HISTORY — DX: Long term (current) use of anticoagulants: Z79.01

## 2019-01-13 ENCOUNTER — Ambulatory Visit (HOSPITAL_COMMUNITY)
Admission: RE | Admit: 2019-01-13 | Discharge: 2019-01-13 | Disposition: A | Discharge status: Home / Self Care | Payer: Medicare HMO | Source: Ambulatory Visit | Attending: Neurology | Admitting: Neurology

## 2019-01-13 ENCOUNTER — Other Ambulatory Visit: Payer: Self-pay | Admitting: Neurology

## 2019-01-13 ENCOUNTER — Other Ambulatory Visit: Payer: Self-pay

## 2019-01-13 DIAGNOSIS — R1312 Dysphagia, oropharyngeal phase: Secondary | ICD-10-CM | POA: Diagnosis not present

## 2019-01-13 DIAGNOSIS — R05 Cough: Secondary | ICD-10-CM | POA: Diagnosis not present

## 2019-01-13 DIAGNOSIS — R1310 Dysphagia, unspecified: Secondary | ICD-10-CM | POA: Diagnosis not present

## 2019-01-13 NOTE — Telephone Encounter (Signed)
ERROR

## 2019-01-14 ENCOUNTER — Telehealth: Payer: Self-pay | Admitting: *Deleted

## 2019-01-14 ENCOUNTER — Ambulatory Visit: Payer: Medicare HMO | Admitting: Cardiology

## 2019-01-14 ENCOUNTER — Encounter: Payer: Self-pay | Admitting: Cardiology

## 2019-01-14 VITALS — BP 120/88 | HR 64 | Temp 97.0°F | Ht 76.0 in | Wt 194.0 lb

## 2019-01-14 DIAGNOSIS — I4821 Permanent atrial fibrillation: Secondary | ICD-10-CM

## 2019-01-14 DIAGNOSIS — Z7901 Long term (current) use of anticoagulants: Secondary | ICD-10-CM

## 2019-01-14 DIAGNOSIS — Z950 Presence of cardiac pacemaker: Secondary | ICD-10-CM

## 2019-01-14 DIAGNOSIS — I10 Essential (primary) hypertension: Secondary | ICD-10-CM | POA: Diagnosis not present

## 2019-01-14 DIAGNOSIS — R55 Syncope and collapse: Secondary | ICD-10-CM

## 2019-01-14 NOTE — Telephone Encounter (Signed)
   [facility] Pre-operative Risk Assessment    Request for surgical clearance:  1. What type of surgery is being performed? WIDE EXCISION OF LEFT NECK MELANOMA  2. When is this surgery scheduled? TBD    3. What type of clearance is required (medical clearance vs. Pharmacy clearance to hold med vs. Both)? BOTH  4. Are there any medications that need to be held prior to surgery and how long? ELIQUIS   5. Practice name and name of physician performing surgery? [facility]; DR. Garret Reddish ROSEN   6. What is your office phone number [phone]    7.   What is your office fax number [phone]  8.   Anesthesia type (None, local, MAC, general) ? NOT LISTED   Julaine Hua 01/14/2019, 3:46 PM  _________________________________________________________________   (provider comments below)

## 2019-01-14 NOTE — Telephone Encounter (Signed)
Patient with diagnosis of Atrial Fibrillation on Eliquis 5 mg BID for anticoagulation.    Procedure: WIDE EXCISION OF LEFT NECK MELANOMA Date of procedure: TBD  CHADS2-VASc score of  3 (HTN, AGE)  CrCl: 68 ml/min  Per office protocol, patient can hold Eliquis for 2 days prior to procedure.

## 2019-01-15 NOTE — Telephone Encounter (Signed)
   Primary Cardiologist: Peter Martinique, MD  Chart reviewed as part of pre-operative protocol coverage. Given past medical history and time since last visit, based on ACC/AHA guidelines, Daniel Everett would be at acceptable risk for the planned procedure without further cardiovascular testing.   Patient with diagnosis of Atrial Fibrillation on Eliquis 5 mg BID for anticoagulation with a  CHADS2-VASc score of  3 (HTN, AGE) and CrCl: 68 ml/min  Per office protocol, patient can hold Eliquis for 2 days prior to procedure.   Pt was last seen by Dr. Curt Bears 11/20/2018 s/p PPM placement and was doing well with normal device functioning.   I will route this recommendation to the requesting party via Epic fax function and remove from pre-op pool.  Please call with questions.  Kathyrn Drown, NP 01/15/2019, 8:56 AM

## 2019-01-19 NOTE — H&P (Signed)
HPI:   Daniel Everett is a 85 y.o. male who presents as a consult Patient.   Referring Provider: Self, A Referral  Chief complaint: Melanoma.  HPI: History of multiple skin cancers and multiple small melanomas in the past. Recent biopsy of melanoma from the left side of his neck revealed positive margin and 1.2 mm thickness. It is also ulcerated. He is anticoagulated for atrial fibrillation.  PMH/Meds/All/SocHx/FamHx/ROS:   Past Medical History:  Diagnosis Date  . Arthritis  . Cancer [facility])   History reviewed. No pertinent surgical history.  No family history of bleeding disorders, wound healing problems or difficulty with anesthesia.   Social History   Socioeconomic History  . Marital status: Widowed  Spouse name: Not on file  . Number of children: Not on file  . Years of education: Not on file  . Highest education level: Not on file  Occupational History  . Not on file  Social Needs  . Financial resource strain: Not on file  . Food insecurity  Worry: Not on file  Inability: Not on file  . Transportation needs  Medical: Not on file  Non-medical: Not on file  Tobacco Use  . Smoking status: Never Smoker  . Smokeless tobacco: Never Used  Substance and Sexual Activity  . Alcohol use: Not Currently  . Drug use: Never  . Sexual activity: Not on file  Lifestyle  . Physical activity  Days per week: Not on file  Minutes per session: Not on file  . Stress: Not on file  Relationships  . Social Medical illustrator on phone: Not on file  Gets together: Not on file  Attends religious service: Not on file  Active member of club or organization: Not on file  Attends meetings of clubs or organizations: Not on file  Relationship status: Not on file  Other Topics Concern  . Not on file  Social History Narrative  . Not on file   Current Outpatient Medications:  . acetaminophen (TYLENOL) 650 MG CR tablet, Take 650 mg by mouth., Disp: , Rfl:  . amLODIPine (NORVASC) 5 MG  tablet, , Disp: , Rfl:  . atorvastatin (LIPITOR) 20 MG tablet, , Disp: , Rfl:  . diclofenac (VOLTAREN) 1 % Gel gel, Apply topically., Disp: , Rfl:  . finasteride (PROSCAR) 5 mg tablet, TAKE 1 TABLET DAILY, Disp: , Rfl:  . hydroCHLOROthiazide (MICROZIDE) 12.5 mg capsule, , Disp: , Rfl:  . latanoprost (XALATAN) 0.005 % ophthalmic solution, Apply 1 drop to eye., Disp: , Rfl:  . levothyroxine (SYNTHROID) 75 MCG tablet, , Disp: , Rfl:  . lisinopriL (PRINIVIL,ZESTRIL) 20 MG tablet, , Disp: , Rfl:  . omeprazole (PRILOSEC) 20 MG capsule, , Disp: , Rfl:  . pyridoxine, vitamin B6, (B-6) 500 MG tablet, Take 500 mg by mouth., Disp: , Rfl:  . timolol (TIMOPTIC) 0.5 % ophthalmic solution, Apply 1 drop to eye., Disp: , Rfl:  . topiramate (TOPAMAX) 25 MG tablet, , Disp: , Rfl:  . traMADoL (ULTRAM) 50 mg tablet, Take 1 every 6 hours for pain not helped by Tylenol alone, Disp: , Rfl:  . turmeric-turmeric root extract 450-50 mg Cap, Take by mouth., Disp: , Rfl:   A complete ROS was performed with pertinent positives/negatives noted in the HPI. The remainder of the ROS are negative.   Physical Exam:   BP 115/74  Pulse 76  Temp 97.5 F (36.4 C)  Ht 1.93 m (6\' 4" )  Wt 88 kg (194 lb)  BMI 23.61 kg/m   General:  Healthy and alert, in no distress, breathing easily. Normal affect. In a pleasant mood. Head: Normocephalic, atraumatic. No masses, or scars. Eyes: Pupils are equal, and reactive to light. Vision is grossly intact. No spontaneous or gaze nystagmus. Ears: Ear canals are clear. Tympanic membranes are intact, with normal landmarks and the middle ears are clear and healthy. Hearing: Grossly normal. Nose: Nasal cavities are clear with healthy mucosa, no polyps or exudate. Airways are patent. Face: No masses or scars, facial nerve function is symmetric. Oral Cavity: No mucosal abnormalities are noted. Tongue with normal mobility. Dentition appears healthy. Oropharynx: Tonsils are symmetric. There are  no mucosal masses identified. Tongue base appears normal and healthy. Larynx/Hypopharynx: deferred Chest: Deferred Neck: No palpable masses, no cervical adenopathy, no thyroid nodules or enlargement. Left neck biopsy site healing nicely. Double scars from previous procedures. Neuro: Cranial nerves II-XII with normal function. Balance: Normal gate. Other findings: none.  Independent Review of Additional Tests or Records:  none  Procedures:  none  Impression & Plans:  Melanoma at least 1.2 mm thickness left neck with ulceration. He is going to need a wide excision, and sentinel node biopsy. I will have to talk with his cardiologist to see if we can stop his anticoagulation therapy. We will set up a lymphoscintigram to do preoperatively. Risks and benefits of surgery were discussed in detail. We will schedule after cardiology clearance.

## 2019-01-20 NOTE — Telephone Encounter (Signed)
Follow up:     Patient returning cal back concering medical clearance.

## 2019-01-20 NOTE — Telephone Encounter (Signed)
Called the patient Daniel Everett to ask about his concerns about his medical clearance. Patient wanted to know if he could have someone pick him up and take him to the hospital on the morning of his procedure since he has to quarantine. I informed the patient that yes he can have someone pick him up the morning of his procedure and only one person can stay at the hospital while his procedure is going on. I let him know that it can be the same person that picks him to take him to the hospital and that he/she will be asked COVID screen questions and that he/she may not switch out with anyone else while in the waiting area/room. Patient then realized that the message was suppose to go to the hospital staff that called and told him about taking the COVID test and having to quarantine. He apologized for taking up my time and I asked if I was able to help him understand what he asked. Patient verbalized and understanding that I helped him understand somewhat and appreciated that I called.

## 2019-01-26 ENCOUNTER — Other Ambulatory Visit (HOSPITAL_COMMUNITY): Payer: Self-pay | Admitting: Otolaryngology

## 2019-01-26 ENCOUNTER — Encounter: Payer: Self-pay | Admitting: Family Medicine

## 2019-01-26 ENCOUNTER — Ambulatory Visit (INDEPENDENT_AMBULATORY_CARE_PROVIDER_SITE_OTHER): Payer: Medicare HMO | Admitting: Family Medicine

## 2019-01-26 ENCOUNTER — Other Ambulatory Visit: Payer: Self-pay

## 2019-01-26 DIAGNOSIS — C434 Malignant melanoma of scalp and neck: Secondary | ICD-10-CM

## 2019-01-26 DIAGNOSIS — M1711 Unilateral primary osteoarthritis, right knee: Secondary | ICD-10-CM | POA: Diagnosis not present

## 2019-01-26 NOTE — Patient Instructions (Signed)
Will take 2 weeks for injection to work Continue brace as needed Continue exercises See me in 4-6 weeks

## 2019-01-26 NOTE — Pre-Procedure Instructions (Addendum)
[facility] [place] [place] [facility] [address] [place] [address] Phone: [phone] Fax: [phone]  [facility] [place] [place] [facility] [place] [id number] [place] [id number] Phone: [phone] Fax: [phone]    Your procedure is scheduled on Fri., Nov. 6, 2020 from 7:30AM-9:30AM  Report to [facility] Entrance "A" at 5:30AM  Call this number if you have problems the morning of surgery:  [phone]   Remember:  Do not eat or drink after midnight on Nov. 5th    Take these medicines the morning of surgery with A SIP OF WATER: Acetaminophen (TYLENOL) AmLODipine (NORVASC) Atorvastatin (LIPITOR)   Finasteride (PROSCAR) Levothyroxine (SYNTHROID) Timolol (TIMOPTIC) Topiramate (TOPAMAX)  Take last dose of Eliquis on 11/3, per Dr. Martinique  As of today, stop taking all Aspirin (unless instructed by your doctor) and Other Aspirin containing products, Vitamins, Fish oils, and Herbal medications. Also stop all NSAIDS i.e. Advil, Ibuprofen, Motrin, Aleve, Anaprox, Naproxen, BC, Goody Powders, and all Supplements.  No Smoking of any kind, Tobacco, or Alcohol products 24 hours prior to your procedure. If you use a Cpap at night, you may bring all equipment for your overnight stay.   Special instructions:  [facility]- Preparing For Surgery  Before surgery, you can play an important role. Because skin is not sterile, your skin needs to be as free of germs as possible. You can reduce the number of germs on your skin by washing with CHG (chlorahexidine gluconate) Soap before surgery.  CHG is an antiseptic cleaner which kills germs and bonds with the skin to continue killing germs even after washing.    Please do not use if you have an allergy to CHG or antibacterial soaps. If your skin becomes reddened/irritated stop using the CHG.  Do not shave (including legs and  underarms) for at least 48 hours prior to first CHG shower. It is OK to shave your face.  Please follow these instructions carefully.   1. Shower the NIGHT BEFORE SURGERY and the MORNING OF SURGERY with CHG.   2. If you chose to wash your hair, wash your hair first as usual with your normal shampoo.  3. After you shampoo, rinse your hair and body thoroughly to remove the shampoo.  4. Use CHG as you would any other liquid soap. You can apply CHG directly to the skin and wash gently with a scrungie or a clean washcloth.   5. Apply the CHG Soap to your body ONLY FROM THE NECK DOWN.  Do not use on open wounds or open sores. Avoid contact with your eyes, ears, mouth and genitals (private parts). Wash Face and genitals (private parts)  with your normal soap.  6. Wash thoroughly, paying special attention to the area where your surgery will be performed.  7. Thoroughly rinse your body with warm water from the neck down.  8. DO NOT shower/wash with your normal soap after using and rinsing off the CHG Soap.  9. Pat yourself dry with a CLEAN TOWEL.  10. Wear CLEAN PAJAMAS to bed the night before surgery, wear comfortable clothes the morning of surgery  11. Place CLEAN SHEETS on your bed the night of your first shower and DO NOT SLEEP WITH PETS.   Day of Surgery:             Remember to brush your teeth WITH YOUR REGULAR TOOTHPASTE.  Do not wear  jewelry.  Do not wear lotions, powders, colognes, or deodorant.  Do not shave 48 hours prior to surgery.  Men may shave face and neck.  Do not bring valuables to the hospital.  [facility] is not responsible for any belongings or valuables.  Contacts, dentures or bridgework may not be worn into surgery.    For patients admitted to the hospital, discharge time will be determined by your treatment team.  Patients discharged the day of surgery will not be allowed to drive home, and someone age 41 and over needs to stay with them for 24  hours.  Please wear clean clothes to the hospital/surgery center.    Please read over the following fact sheets that you were given.

## 2019-01-26 NOTE — Assessment & Plan Note (Signed)
Repeat injection given today, discussed icing regimen and home exercise, discussed which activities to do which wants to avoid.  Patient will increase activity slowly over the course of next several weeks.  Could be a candidate for viscosupplementation which she does have approval for.  We will consider that at follow-up if continuing to have pain.  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

## 2019-01-26 NOTE — Progress Notes (Signed)
[facility] [address], [place] [address] Phone: [phone] Subjective:   [name], am serving as a scribe for Dr. Hulan Saas.  I'm seeing this patient by the request  of:    CC: Right knee pain follow-up [id number]   11/26/2018 Patient elected to continue with conservative therapy at this time but secondary to the instability and the abnormal thigh to calf ratio patient will be fitted for custom brace.  Patient does have some underlying peripheral neuropathy that could also be contributing.  Discussed viscosupplementation but with patient improvement with the steroid injection he wants to continue without it at the moment.  Follow-up with me again 6 to 8 weeks  Update 01/26/2019 Daniel Everett is a 85 y.o. male coming in with complaint of right knee pain. Patient's pain is not improving. Pain is constant.  Patient states it is a dull, throbbing aching pain.  Patient denies though any significant instability at this moment more than usual.  Has been wearing his brace intermittently when he does a lot of activity.  He did not get the custom brace had another one from the [place].     Past Medical History:  Diagnosis Date  . Allergic rhinitis due to pollen 06/07/2008  . Atrial fibrillation ([place])   . Benign essential HTN   . COLONIC POLYPS, HX OF 10/28/2006  . DIVERTICULOSIS, COLON 11/27/2006  . Gait abnormality 03/02/2018  . GEN OSTEOARTHROSIS INVOLVING MULTIPLE SITES 01/03/2010  . HYPERGLYCEMIA 10/18/2008  . HYPOTHYROIDISM 11/27/2006  . NEOPLASM, MALIGNANT, PROSTATE, HX OF, S/P TURP 05/28/2007  . OSTEOARTHRITIS 11/27/2006  . Peripheral neuropathy 04/08/2018  . Presence of permanent cardiac pacemaker 08/20/2018  . WEIGHT LOSS, RECENT 11/27/2006   Past Surgical History:  Procedure Laterality Date  . arthroscopy rt knee    . HYDROCELE EXCISION / REPAIR    . INSERT / REPLACE / REMOVE PACEMAKER  08/20/2018  . PACEMAKER IMPLANT N/A 08/20/2018   Procedure: PACEMAKER IMPLANT;  Surgeon: Constance Haw, MD;  Location: [facility];  Service: Cardiovascular;  Laterality: N/A;  . PROSTATE SURGERY     TURP  . ROTATOR CUFF REPAIR     Social History   Socioeconomic History  . Marital status: Widowed    Spouse name: Not on file  . Number of children: Not on file  . Years of education: Not on file  . Highest education level: Not on file  Occupational History  . Occupation: retired  Scientific laboratory technician  . Financial resource strain: Not on file  . Food insecurity    Worry: Not on file    Inability: Not on file  . Transportation needs    Medical: Not on file    Non-medical: Not on file  Tobacco Use  . Smoking status: Never Smoker  . Smokeless tobacco: Never Used  Substance and Sexual Activity  . Alcohol use: No  . Drug use: No  . Sexual activity: Not Currently  Lifestyle  . Physical activity    Days per week: Not on file    Minutes per session: Not on file  . Stress: Not on file  Relationships  . Social Herbalist on phone: Not on file    Gets together: Not on file    Attends religious service: Not on file    Active member of club or organization: Not on file    Attends meetings of clubs or organizations: Not on file  Relationship status: Not on file  Other Topics Concern  . Not on file  Social History Narrative   Lives alone   Caffeine use: very little   Right handed       Spouse is in a nursing home with dementia   She was in 24/7 care at home for 3.5 year   Also has private attendant    She can't talk   3 children    2 in [place] and one in near [place] still goes to church; 'used to play sports   Basketball through college; [employer]; now [employer] in 59    No Known Allergies Family History  Problem Relation Age of Onset  . Coronary artery disease Mother   . Heart disease Mother   . Pancreatic cancer Father   . Cancer Father   . Tremor Sister      Current Outpatient Medications (Endocrine & Metabolic):  .  levothyroxine (SYNTHROID) 75 MCG tablet, TAKE 1 TABLET DAILY (Patient taking differently: Take 75 mcg by mouth daily before breakfast. )  Current Outpatient Medications (Cardiovascular):  .  amLODipine (NORVASC) 5 MG tablet, Take 1 tablet (5 mg total) by mouth daily. Marland Kitchen  atorvastatin (LIPITOR) 20 MG tablet, TAKE 1 TABLET DAILY (Patient taking differently: Take 20 mg by mouth daily. ) .  hydrochlorothiazide (MICROZIDE) 12.5 MG capsule, Take 1 capsule (12.5 mg total) by mouth daily. (Patient taking differently: Take 12.5 mg by mouth every evening. ) .  lisinopril (ZESTRIL) 20 MG tablet, TAKE 1 TABLET DAILY (Patient taking differently: Take 20 mg by mouth daily. )   Current Outpatient Medications (Analgesics):  .  acetaminophen (TYLENOL) 650 MG CR tablet, Take 650 mg by mouth 2 (two) times a day.  Current Outpatient Medications (Hematological):  .  apixaban (ELIQUIS) 5 MG TABS tablet, Take 1 tablet (5 mg total) by mouth 2 (two) times daily.  Current Outpatient Medications (Other):  Marland Kitchen  Cholecalciferol (VITAMIN D3) 50 MCG (2000 UT) TABS, Take 2,000 Units by mouth daily. .  finasteride (PROSCAR) 5 MG tablet, TAKE 1 TABLET DAILY (Patient taking differently: Take 5 mg by mouth daily. ) .  latanoprost (XALATAN) 0.005 % ophthalmic solution, Place 1 drop into both eyes at bedtime.  .  Multiple Vitamin (MULTIVITAMIN WITH MINERALS) TABS tablet, Take 1 tablet by mouth daily. Senior Plus Multivitamin .  omeprazole (PRILOSEC) 20 MG capsule, TAKE 1 CAPSULE BY MOUTH EVERY DAY (Patient taking differently: Take 20 mg by mouth daily before supper. ) .  pyridOXINE (VITAMIN B-6) 100 MG tablet, Take 100 mg by mouth daily. .  timolol (TIMOPTIC) 0.5 % ophthalmic solution, Place 1 drop into both eyes 2 (two) times daily.   Marland Kitchen  topiramate (TOPAMAX) 25 MG tablet, One tablet twice a day for 2 weeks, then take 2 tablets twice a day (Patient taking differently: Take  50 mg by mouth 2 (two) times daily. One tablet twice a day for 2 weeks, then take 2 tablets twice a day)    Past medical history, social, surgical and family history all reviewed in electronic medical record.  No pertanent information unless stated regarding to the chief complaint.   Review of Systems:  No headache, visual changes, nausea, vomiting, diarrhea, constipation, dizziness, abdominal pain, skin rash, fevers, chills, night sweats, weight loss, swollen lymph nodes, body aches, joint swelling, chest pain, shortness of breath, mood changes.  Positive muscle aches  Objective  Blood pressure 120/84, height 6\' 4"  (1.93  m), weight 187 lb (84.8 kg).   General: No apparent distress alert and oriented x3 mood and affect normal, dressed appropriately.  HEENT: Pupils equal, extraocular movements intact  Respiratory: Patient's speak in full sentences and does not appear short of breath  Cardiovascular: No lower extremity edema, non tender, no erythema  Abdomen: Soft nontender  Neuro: Cranial nerves II through XII are intact, neurovascularly intact in all extremities with 2+ DTRs and 2+ pulses.  Gait antalgic MSK:  Non tender with full range of motion and good stability and symmetric strength and tone of shoulders, elbows, wrist, hip, and ankles bilaterally.  Moderate arthritic changes Knee: Right valgus deformity noted.  Abnormal thigh to calf ratio.  Tender to palpation over medial and PF joint line.  ROM full in flexion and extension and lower leg rotation. instability with valgus force.  painful patellar compression. Patellar glide with moderate crepitus. Patellar and quadriceps tendons unremarkable. Hamstring and quadriceps strength is normal. Contralateral knee shows contralateral knee unremarkable  After informed written and verbal consent, patient was seated on exam table. Right knee was prepped with alcohol swab and utilizing anterolateral approach, patient's right knee space was  injected with 4:1  marcaine 0.5%: Kenalog 40mg /dL. Patient tolerated the procedure well without immediate complications.   Impression and Recommendations:     The above documentation has been reviewed and is accurate and complete Lyndal Pulley, DO       Note: This dictation was prepared with Dragon dictation along with smaller phrase technology. Any transcriptional errors that result from this process are unintentional.

## 2019-01-27 ENCOUNTER — Encounter (HOSPITAL_COMMUNITY)
Admission: RE | Admit: 2019-01-27 | Discharge: 2019-01-27 | Disposition: A | Discharge status: Home / Self Care | Payer: Medicare HMO | Source: Ambulatory Visit | Attending: Otolaryngology | Admitting: Otolaryngology

## 2019-01-27 ENCOUNTER — Other Ambulatory Visit: Payer: Self-pay

## 2019-01-27 ENCOUNTER — Encounter (HOSPITAL_COMMUNITY): Payer: Self-pay

## 2019-01-27 ENCOUNTER — Other Ambulatory Visit (HOSPITAL_COMMUNITY)
Admission: RE | Admit: 2019-01-27 | Discharge: 2019-01-27 | Disposition: A | Discharge status: Home / Self Care | Payer: Medicare HMO | Source: Ambulatory Visit | Attending: Otolaryngology | Admitting: Otolaryngology

## 2019-01-27 DIAGNOSIS — I4891 Unspecified atrial fibrillation: Secondary | ICD-10-CM | POA: Insufficient documentation

## 2019-01-27 DIAGNOSIS — Z7901 Long term (current) use of anticoagulants: Secondary | ICD-10-CM | POA: Diagnosis not present

## 2019-01-27 DIAGNOSIS — Z20828 Contact with and (suspected) exposure to other viral communicable diseases: Secondary | ICD-10-CM | POA: Diagnosis not present

## 2019-01-27 DIAGNOSIS — Z01818 Encounter for other preprocedural examination: Secondary | ICD-10-CM | POA: Insufficient documentation

## 2019-01-27 DIAGNOSIS — Z950 Presence of cardiac pacemaker: Secondary | ICD-10-CM | POA: Diagnosis not present

## 2019-01-27 LAB — BASIC METABOLIC PANEL
Anion gap: 10 (ref 5–15)
BUN: 25 mg/dL — ABNORMAL HIGH (ref 8–23)
CO2: 22 mmol/L (ref 22–32)
Calcium: 9.3 mg/dL (ref 8.9–10.3)
Chloride: 105 mmol/L (ref 98–111)
Creatinine, Ser: 1.03 mg/dL (ref 0.61–1.24)
GFR calc Af Amer: >60 mL/min (ref >60)
GFR calc non Af Amer: >60 mL/min (ref >60)
Glucose, Bld: 96 mg/dL (ref 70–99)
Potassium: 4 mmol/L (ref 3.5–5.1)
Sodium: 137 mmol/L (ref 135–145)

## 2019-01-27 LAB — SARS CORONAVIRUS 2 (TAT 6-24 HRS): SARS Coronavirus 2: NEGATIVE

## 2019-01-27 LAB — CBC
HCT: 45.6 % (ref 39.0–52.0)
Hemoglobin: 15.4 g/dL (ref 13.0–17.0)
MCH: 32.8 pg (ref 26.0–34.0)
MCHC: 33.8 g/dL (ref 30.0–36.0)
MCV: 97 fL (ref 80.0–100.0)
Platelets: 223 10*3/uL (ref 150–400)
RBC: 4.7 MIL/uL (ref 4.22–5.81)
RDW: 13.2 % (ref 11.5–15.5)
WBC: 13.1 10*3/uL — ABNORMAL HIGH (ref 4.0–10.5)
nRBC: 0 % (ref 0.0–0.2)

## 2019-01-27 NOTE — Progress Notes (Addendum)
PCP - Dr. Elease Hashimoto Cardiologist - Dr. Martinique  PPM/ICD - St. Jude Pacer Device Orders - "procedure will likely interfere with device function- asynchronous pacing during procedure and returned to normal programming after procedure" Rep Notified - Windle Guard notified of surgery date/time - per Aaron Edelman will come to the hospital around 0630-0700 to see patient.  Chest x-ray - 08/21/18 EKG - 11/20/18 Stress Test - denies ECHO - 04/08/16 Cardiac Cath - denies  Sleep Study - denies   Blood Thinner Instructions: las dose of Eliquis 01/26/19 per Dr. Martinique Aspirin Instructions:Patient instructed to hold all Aspirin, NSAID's, herbal medications, fish oil and vitamins 7 days prior to surgery.    COVID TEST- 01/27/19 @[facility] . Patient given instructions to inform security he is having surgery and needs to go to the procedure line. Aware if he [facility] the [facility] he will need to be redirected to the PAT line. Patient instructed to quarantine at home following his covid test, may keep medical appointments. Aware to wear mask.  Anesthesia review: cardiac history  Patient denies shortness of breath, fever, cough and chest pain at PAT appointment   All instructions explained to the patient, with a verbal understanding of the material. Patient agrees to go over the instructions while at home for a better understanding. Patient also instructed to self quarantine after being tested for COVID-19. The opportunity to ask questions was provided.

## 2019-01-28 ENCOUNTER — Ambulatory Visit (HOSPITAL_COMMUNITY)
Admission: RE | Admit: 2019-01-28 | Discharge: 2019-01-28 | Disposition: A | Discharge status: Home / Self Care | Payer: Medicare HMO | Source: Ambulatory Visit | Attending: Otolaryngology | Admitting: Otolaryngology

## 2019-01-28 DIAGNOSIS — C439 Malignant melanoma of skin, unspecified: Secondary | ICD-10-CM | POA: Diagnosis not present

## 2019-01-28 DIAGNOSIS — C434 Malignant melanoma of scalp and neck: Secondary | ICD-10-CM | POA: Insufficient documentation

## 2019-01-28 MED ORDER — TECHNETIUM TC 99M SULFUR COLLOID FILTERED
0.5000 | Freq: Once | INTRAVENOUS | Status: AC | PRN
  Administered 2019-01-28: 0.5 via INTRADERMAL

## 2019-01-28 NOTE — Progress Notes (Signed)
Follows with cardiology for hx marked bradycardia with Afib s/p Saint Jude single-chamber pacemaker implanted 08/20/2018.cardiac clearance per telephone encounter 01/15/19 "Given past medical history and time since last visit, based on ACC/AHA guidelines, Daniel Everett would be at acceptable risk for the planned procedure without further cardiovascular testing. Patient with diagnosis ofAtrial Fibrillationon Eliquis 5 mg BIDfor anticoagulation with a CHADS2-VASc score of3(HTN, AGE) and CrCl: 68 ml/min. Per office protocol, patient can holdEliquisfor2days prior to procedure.Pt was last seen by Dr. Curt Bears 11/20/2018 s/p PPM placement and was doing well with normal device functioning."  Device form on patient chart.  EKG 11/20/2018: ventricular paced rhythm rate 64  Preop labs reviewed, WNL.  TTE 04/08/16: - Left ventricle: The cavity size was normal. Wall thickness was   normal. Systolic function was normal. The estimated ejection   fraction was in the range of 55% to 60%. Wall motion was normal;   there were no regional wall motion abnormalities. The study is   not technically sufficient to allow evaluation of LV diastolic   function. - Aortic valve: There was trivial regurgitation. - Mitral valve: There was mild regurgitation. - Left atrium: The atrium was severely dilated. - Right atrium: The atrium was severely dilated. - Atrial septum: No defect or patent foramen ovale was identified. - Tricuspid valve: There was moderate regurgitation.   Wynonia Musty [facility]Anesthesiology Phone [phone] 01/28/2019 9:50 AM

## 2019-01-28 NOTE — Anesthesia Preprocedure Evaluation (Addendum)
Anesthesia Evaluation  Patient identified by MRN, date of birth, ID band Patient awake    Reviewed: Allergy & Precautions, NPO status , Patient's Chart, lab work & pertinent test results  Airway Mallampati: I  TM Distance: >3 FB Neck ROM: Full    Dental   Pulmonary    Pulmonary exam normal        Cardiovascular hypertension, Pt. on medications Normal cardiovascular exam+ dysrhythmias Atrial Fibrillation + pacemaker      Neuro/Psych    GI/Hepatic GERD  Medicated and Controlled,  Endo/Other    Renal/GU      Musculoskeletal   Abdominal   Peds  Hematology   Anesthesia Other Findings   Reproductive/Obstetrics                           Anesthesia Physical Anesthesia Plan  ASA: III  Anesthesia Plan: General   Post-op Pain Management:    Induction: Intravenous  PONV Risk Score and Plan: 2  Airway Management Planned: Oral ETT  Additional Equipment:   Intra-op Plan:   Post-operative Plan:   Informed Consent: I have reviewed the patients History and Physical, chart, labs and discussed the procedure including the risks, benefits and alternatives for the proposed anesthesia with the patient or authorized representative who has indicated his/her understanding and acceptance.       Plan Discussed with: CRNA and Surgeon  Anesthesia Plan Comments: (Follows with cardiology for hx marked bradycardia with Afib s/p Saint Jude single-chamber pacemaker implanted 08/20/2018.cardiac clearance per telephone encounter 01/15/19 "Given past medical history and time since last visit, based on ACC/AHA guidelines, Daniel Everett would be at acceptable risk for the planned procedure without further cardiovascular testing. Patient with diagnosis ofAtrial Fibrillationon Eliquis 5 mg BIDfor anticoagulation with a CHADS2-VASc score of3(HTN, AGE) and CrCl: 68 ml/min. Per office protocol, patient can  holdEliquisfor2days prior to procedure.Pt was last seen by Dr. Curt Bears 11/20/2018 s/p PPM placement and was doing well with normal device functioning."  Device form on patient chart.  EKG 11/20/2018: ventricular paced rhythm rate 64  Preop labs reviewed, WNL.  TTE 04/08/16: - Left ventricle: The cavity size was normal. Wall thickness was   normal. Systolic function was normal. The estimated ejection   fraction was in the range of 55% to 60%. Wall motion was normal;   there were no regional wall motion abnormalities. The study is   not technically sufficient to allow evaluation of LV diastolic   function. - Aortic valve: There was trivial regurgitation. - Mitral valve: There was mild regurgitation. - Left atrium: The atrium was severely dilated. - Right atrium: The atrium was severely dilated. - Atrial septum: No defect or patent foramen ovale was identified. - Tricuspid valve: There was moderate regurgitation. )    Anesthesia Quick Evaluation

## 2019-01-29 ENCOUNTER — Ambulatory Visit (HOSPITAL_COMMUNITY): Payer: Medicare HMO | Admitting: Anesthesiology

## 2019-01-29 ENCOUNTER — Encounter (HOSPITAL_COMMUNITY): Payer: Self-pay | Admitting: *Deleted

## 2019-01-29 ENCOUNTER — Other Ambulatory Visit: Payer: Self-pay

## 2019-01-29 ENCOUNTER — Observation Stay (HOSPITAL_COMMUNITY)
Admission: RE | Admit: 2019-01-29 | Discharge: 2019-01-30 | Disposition: A | Discharge status: Home / Self Care | Payer: Medicare HMO | Attending: Otolaryngology | Admitting: Otolaryngology

## 2019-01-29 ENCOUNTER — Encounter (HOSPITAL_COMMUNITY)
Admission: RE | Disposition: A | Discharge status: Home / Self Care | Payer: Self-pay | Source: Home / Self Care | Attending: Otolaryngology

## 2019-01-29 ENCOUNTER — Ambulatory Visit (HOSPITAL_COMMUNITY): Payer: Medicare HMO | Admitting: Physician Assistant

## 2019-01-29 DIAGNOSIS — Z79899 Other long term (current) drug therapy: Secondary | ICD-10-CM | POA: Diagnosis not present

## 2019-01-29 DIAGNOSIS — M1990 Unspecified osteoarthritis, unspecified site: Secondary | ICD-10-CM | POA: Diagnosis not present

## 2019-01-29 DIAGNOSIS — C434 Malignant melanoma of scalp and neck: Secondary | ICD-10-CM

## 2019-01-29 DIAGNOSIS — I4891 Unspecified atrial fibrillation: Secondary | ICD-10-CM | POA: Insufficient documentation

## 2019-01-29 DIAGNOSIS — Z7901 Long term (current) use of anticoagulants: Secondary | ICD-10-CM | POA: Insufficient documentation

## 2019-01-29 DIAGNOSIS — I10 Essential (primary) hypertension: Secondary | ICD-10-CM | POA: Diagnosis not present

## 2019-01-29 DIAGNOSIS — K219 Gastro-esophageal reflux disease without esophagitis: Secondary | ICD-10-CM | POA: Diagnosis not present

## 2019-01-29 DIAGNOSIS — E039 Hypothyroidism, unspecified: Secondary | ICD-10-CM | POA: Diagnosis not present

## 2019-01-29 DIAGNOSIS — Z950 Presence of cardiac pacemaker: Secondary | ICD-10-CM | POA: Insufficient documentation

## 2019-01-29 DIAGNOSIS — I4821 Permanent atrial fibrillation: Secondary | ICD-10-CM | POA: Diagnosis not present

## 2019-01-29 DIAGNOSIS — Z79890 Hormone replacement therapy (postmenopausal): Secondary | ICD-10-CM | POA: Diagnosis not present

## 2019-01-29 HISTORY — DX: Malignant melanoma of scalp and neck: C43.4

## 2019-01-29 HISTORY — PX: EXCISION MASS NECK: SHX6703

## 2019-01-29 LAB — PROTIME-INR
INR: 1.1 (ref 0.8–1.2)
Prothrombin Time: 14.3 seconds (ref 11.4–15.2)

## 2019-01-29 SURGERY — EXCISION, MASS, NECK
Anesthesia: General | Site: Neck | Laterality: Left

## 2019-01-29 MED ORDER — PANTOPRAZOLE SODIUM 40 MG PO TBEC
40.0000 mg | DELAYED_RELEASE_TABLET | Freq: Every day | ORAL | Status: DC
  Administered 2019-01-29: 40 mg via ORAL
  Filled 2019-01-29: qty 1

## 2019-01-29 MED ORDER — MEPERIDINE HCL 25 MG/ML IJ SOLN: 6.2500 mg | INTRAMUSCULAR | Status: DC | PRN

## 2019-01-29 MED ORDER — LISINOPRIL 20 MG PO TABS
20.0000 mg | ORAL_TABLET | Freq: Every day | ORAL | Status: DC
  Administered 2019-01-29: 20 mg via ORAL
  Filled 2019-01-29: qty 1

## 2019-01-29 MED ORDER — CEPHALEXIN 250 MG/5ML PO SUSR
500.0000 mg | Freq: Two times a day (BID) | ORAL | Status: DC
  Administered 2019-01-29 (×2): 500 mg via ORAL
  Filled 2019-01-29 (×3): qty 10

## 2019-01-29 MED ORDER — 0.9 % SODIUM CHLORIDE (POUR BTL) OPTIME
TOPICAL | Status: DC | PRN
  Administered 2019-01-29: 1000 mL

## 2019-01-29 MED ORDER — METHYLENE BLUE 0.5 % INJ SOLN
INTRAVENOUS | Status: AC
  Filled 2019-01-29: qty 10

## 2019-01-29 MED ORDER — BACITRACIN ZINC 500 UNIT/GM EX OINT
1.0000 "application " | TOPICAL_OINTMENT | Freq: Three times a day (TID) | CUTANEOUS | Status: DC
  Administered 2019-01-29 – 2019-01-30 (×3): 1 via TOPICAL
  Filled 2019-01-29: qty 28.35

## 2019-01-29 MED ORDER — LIDOCAINE-EPINEPHRINE 1 %-1:100000 IJ SOLN
INTRAMUSCULAR | Status: AC
  Filled 2019-01-29: qty 1

## 2019-01-29 MED ORDER — VITAMIN B-6 100 MG PO TABS
100.0000 mg | ORAL_TABLET | Freq: Every day | ORAL | Status: DC
  Administered 2019-01-29: 100 mg via ORAL
  Filled 2019-01-29: qty 1

## 2019-01-29 MED ORDER — FENTANYL CITRATE (PF) 250 MCG/5ML IJ SOLN
INTRAMUSCULAR | Status: AC
  Filled 2019-01-29: qty 5

## 2019-01-29 MED ORDER — PHENYLEPHRINE 40 MCG/ML (10ML) SYRINGE FOR IV PUSH (FOR BLOOD PRESSURE SUPPORT)
PREFILLED_SYRINGE | INTRAVENOUS | Status: DC | PRN
  Administered 2019-01-29: 120 ug via INTRAVENOUS

## 2019-01-29 MED ORDER — ATORVASTATIN CALCIUM 10 MG PO TABS: 20.0000 mg | ORAL_TABLET | Freq: Every day | ORAL | Status: DC

## 2019-01-29 MED ORDER — ROCURONIUM BROMIDE 10 MG/ML (PF) SYRINGE
PREFILLED_SYRINGE | INTRAVENOUS | Status: DC | PRN
  Administered 2019-01-29: 90 mg via INTRAVENOUS

## 2019-01-29 MED ORDER — BACITRACIN ZINC 500 UNIT/GM EX OINT
TOPICAL_OINTMENT | CUTANEOUS | Status: AC
  Filled 2019-01-29: qty 28.35

## 2019-01-29 MED ORDER — FENTANYL CITRATE (PF) 100 MCG/2ML IJ SOLN
INTRAMUSCULAR | Status: DC | PRN
  Administered 2019-01-29: 150 ug via INTRAVENOUS

## 2019-01-29 MED ORDER — ONDANSETRON HCL 4 MG/2ML IJ SOLN
INTRAMUSCULAR | Status: AC
  Filled 2019-01-29: qty 2

## 2019-01-29 MED ORDER — AMLODIPINE BESYLATE 5 MG PO TABS: 5.0000 mg | ORAL_TABLET | Freq: Every day | ORAL | Status: DC

## 2019-01-29 MED ORDER — HYDROCODONE-ACETAMINOPHEN 7.5-325 MG PO TABS: 1.0000 | ORAL_TABLET | Freq: Four times a day (QID) | ORAL | 0 refills | Status: DC | PRN

## 2019-01-29 MED ORDER — HYDROMORPHONE HCL 1 MG/ML IJ SOLN: 0.2500 mg | INTRAMUSCULAR | Status: DC | PRN

## 2019-01-29 MED ORDER — HYDROCHLOROTHIAZIDE 12.5 MG PO CAPS
12.5000 mg | ORAL_CAPSULE | Freq: Every evening | ORAL | Status: DC
  Administered 2019-01-29: 12.5 mg via ORAL
  Filled 2019-01-29: qty 1

## 2019-01-29 MED ORDER — PHENYLEPHRINE HCL-NACL 10-0.9 MG/250ML-% IV SOLN
INTRAVENOUS | Status: DC | PRN
  Administered 2019-01-29: 25 ug/min via INTRAVENOUS

## 2019-01-29 MED ORDER — PHENYLEPHRINE 40 MCG/ML (10ML) SYRINGE FOR IV PUSH (FOR BLOOD PRESSURE SUPPORT)
PREFILLED_SYRINGE | INTRAVENOUS | Status: AC
  Filled 2019-01-29: qty 10

## 2019-01-29 MED ORDER — VITAMIN D 25 MCG (1000 UNIT) PO TABS
2000.0000 [IU] | ORAL_TABLET | Freq: Every day | ORAL | Status: DC
  Administered 2019-01-29: 2000 [IU] via ORAL
  Filled 2019-01-29: qty 2

## 2019-01-29 MED ORDER — LATANOPROST 0.005 % OP SOLN
1.0000 [drp] | Freq: Every day | OPHTHALMIC | Status: DC
  Administered 2019-01-29: 1 [drp] via OPHTHALMIC
  Filled 2019-01-29: qty 2.5

## 2019-01-29 MED ORDER — LACTATED RINGERS IV SOLN
INTRAVENOUS | Status: DC | PRN
  Administered 2019-01-29: 07:00:00 via INTRAVENOUS

## 2019-01-29 MED ORDER — TIMOLOL MALEATE 0.5 % OP SOLN
1.0000 [drp] | Freq: Two times a day (BID) | OPHTHALMIC | Status: DC
  Administered 2019-01-29: 1 [drp] via OPHTHALMIC
  Filled 2019-01-29: qty 5

## 2019-01-29 MED ORDER — SODIUM CHLORIDE (PF) 0.9 % IJ SOLN
INTRAMUSCULAR | Status: AC
  Filled 2019-01-29: qty 10

## 2019-01-29 MED ORDER — LIDOCAINE-EPINEPHRINE 1 %-1:100000 IJ SOLN
INTRAMUSCULAR | Status: DC | PRN
  Administered 2019-01-29: 4 mL

## 2019-01-29 MED ORDER — HYDROCODONE-ACETAMINOPHEN 5-325 MG PO TABS: 1.0000 | ORAL_TABLET | ORAL | Status: DC | PRN

## 2019-01-29 MED ORDER — CEPHALEXIN 500 MG PO CAPS: 500.0000 mg | ORAL_CAPSULE | Freq: Three times a day (TID) | ORAL | 0 refills | Status: DC

## 2019-01-29 MED ORDER — ACETAMINOPHEN 325 MG PO TABS
650.0000 mg | ORAL_TABLET | Freq: Three times a day (TID) | ORAL | Status: DC
  Administered 2019-01-29 – 2019-01-30 (×3): 650 mg via ORAL
  Filled 2019-01-29 (×3): qty 2

## 2019-01-29 MED ORDER — DEXTROSE-NACL 5-0.9 % IV SOLN
INTRAVENOUS | Status: DC
  Administered 2019-01-29 – 2019-01-30 (×2): via INTRAVENOUS

## 2019-01-29 MED ORDER — ONDANSETRON HCL 4 MG/2ML IJ SOLN: 4.0000 mg | Freq: Once | INTRAMUSCULAR | Status: DC | PRN

## 2019-01-29 MED ORDER — TOPIRAMATE 25 MG PO TABS
50.0000 mg | ORAL_TABLET | Freq: Two times a day (BID) | ORAL | Status: DC
  Administered 2019-01-29: 50 mg via ORAL
  Filled 2019-01-29 (×2): qty 2

## 2019-01-29 MED ORDER — FINASTERIDE 5 MG PO TABS: 5.0000 mg | ORAL_TABLET | Freq: Every day | ORAL | Status: DC

## 2019-01-29 MED ORDER — LEVOTHYROXINE SODIUM 75 MCG PO TABS
75.0000 ug | ORAL_TABLET | Freq: Every day | ORAL | Status: DC
  Administered 2019-01-30: 75 ug via ORAL
  Filled 2019-01-29: qty 1

## 2019-01-29 MED ORDER — LIDOCAINE 2% (20 MG/ML) 5 ML SYRINGE
INTRAMUSCULAR | Status: DC | PRN
  Administered 2019-01-29: 100 mg via INTRAVENOUS

## 2019-01-29 MED ORDER — PROPOFOL 10 MG/ML IV BOLUS
INTRAVENOUS | Status: DC | PRN
  Administered 2019-01-29: 80 mg via INTRAVENOUS

## 2019-01-29 MED ORDER — DEXAMETHASONE SODIUM PHOSPHATE 10 MG/ML IJ SOLN
INTRAMUSCULAR | Status: DC | PRN
  Administered 2019-01-29: 5 mg via INTRAVENOUS

## 2019-01-29 MED ORDER — ONDANSETRON HCL 4 MG/2ML IJ SOLN
INTRAMUSCULAR | Status: DC | PRN
  Administered 2019-01-29: 4 mg via INTRAVENOUS

## 2019-01-29 MED ORDER — SUGAMMADEX SODIUM 200 MG/2ML IV SOLN
INTRAVENOUS | Status: DC | PRN
  Administered 2019-01-29: 180 mg via INTRAVENOUS

## 2019-01-29 MED ORDER — PROPOFOL 10 MG/ML IV BOLUS
INTRAVENOUS | Status: AC
  Filled 2019-01-29: qty 20

## 2019-01-29 MED ORDER — BACITRACIN ZINC 500 UNIT/GM EX OINT
TOPICAL_OINTMENT | CUTANEOUS | Status: DC | PRN
  Administered 2019-01-29: 1 via TOPICAL

## 2019-01-29 MED ORDER — ADULT MULTIVITAMIN W/MINERALS CH
1.0000 | ORAL_TABLET | Freq: Every day | ORAL | Status: DC
  Administered 2019-01-29: 1 via ORAL
  Filled 2019-01-29: qty 1

## 2019-01-29 SURGICAL SUPPLY — 39 items
APPLIER CLIP 9.375 SM OPEN (CLIP)
CANISTER SUCT 3000ML PPV (MISCELLANEOUS) ×2 IMPLANT
CLEANER TIP ELECTROSURG 2X2 (MISCELLANEOUS) ×2 IMPLANT
CLIP APPLIE 9.375 SM OPEN (CLIP) IMPLANT
CONT SPEC 4OZ CLIKSEAL STRL BL (MISCELLANEOUS) IMPLANT
CORD BIPOLAR FORCEPS 12FT (ELECTRODE) IMPLANT
COVER SURGICAL LIGHT HANDLE (MISCELLANEOUS) ×2 IMPLANT
COVER WAND RF STERILE (DRAPES) IMPLANT
DERMABOND ADVANCED (GAUZE/BANDAGES/DRESSINGS) ×1
DERMABOND ADVANCED .7 DNX12 (GAUZE/BANDAGES/DRESSINGS) ×1 IMPLANT
DRAIN HEMOVAC 7FR (DRAIN) IMPLANT
DRAIN TLS ROUND 10FR (DRAIN) ×2 IMPLANT
DRAPE UTILITY XL STRL (DRAPES) ×2 IMPLANT
ELECT COATED BLADE 2.86 ST (ELECTRODE) ×2 IMPLANT
ELECT REM PT RETURN 9FT ADLT (ELECTROSURGICAL) ×2
ELECTRODE REM PT RTRN 9FT ADLT (ELECTROSURGICAL) ×1 IMPLANT
EVACUATOR SILICONE 100CC (DRAIN) ×2 IMPLANT
FORCEPS BIPOLAR SPETZLER 8 1.0 (NEUROSURGERY SUPPLIES) ×2 IMPLANT
GAUZE 4X4 16PLY RFD (DISPOSABLE) IMPLANT
GLOVE ECLIPSE 7.5 STRL STRAW (GLOVE) ×2 IMPLANT
GOWN STRL REUS W/ TWL LRG LVL3 (GOWN DISPOSABLE) ×2 IMPLANT
GOWN STRL REUS W/TWL LRG LVL3 (GOWN DISPOSABLE) ×2
KIT BASIN OR (CUSTOM PROCEDURE TRAY) ×2 IMPLANT
KIT TURNOVER KIT B (KITS) ×2 IMPLANT
MARKER SKIN DUAL TIP RULER LAB (MISCELLANEOUS) ×2 IMPLANT
NEEDLE 27GAX1/2IN MONOJET (NEEDLE) ×2 IMPLANT
NS IRRIG 1000ML POUR BTL (IV SOLUTION) ×2 IMPLANT
PAD ARMBOARD 7.5X6 YLW CONV (MISCELLANEOUS) ×4 IMPLANT
PENCIL FOOT CONTROL (ELECTRODE) ×2 IMPLANT
SPONGE INTESTINAL PEANUT (DISPOSABLE) IMPLANT
STAPLER VISISTAT 35W (STAPLE) ×2 IMPLANT
SUT CHROMIC 3 0 SH 27 (SUTURE) ×2 IMPLANT
SUT ETHILON 3 0 PS 1 (SUTURE) ×2 IMPLANT
SUT SILK 2 0 SH (SUTURE) ×2 IMPLANT
SUT SILK 4 0 REEL (SUTURE) ×2 IMPLANT
SUT VIC AB 3-0 SH 27 (SUTURE) ×1
SUT VIC AB 3-0 SH 27X BRD (SUTURE) ×1 IMPLANT
TOWEL GREEN STERILE FF (TOWEL DISPOSABLE) ×2 IMPLANT
TRAY ENT MC OR (CUSTOM PROCEDURE TRAY) ×2 IMPLANT

## 2019-01-29 NOTE — Plan of Care (Signed)
  Problem: Education: Goal: Knowledge of General Education information will improve Description Including pain rating scale, medication(s)/side effects and non-pharmacologic comfort measures Outcome: Progressing   

## 2019-01-29 NOTE — Anesthesia Postprocedure Evaluation (Signed)
Anesthesia Post Note  Patient: Daniel Everett  Procedure(s) Performed: 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 (Left Neck)     Patient location during evaluation: PACU Anesthesia Type: General Level of consciousness: awake and alert Pain management: pain level controlled Vital Signs Assessment: post-procedure vital signs reviewed and stable Respiratory status: spontaneous breathing, nonlabored ventilation, respiratory function stable and patient connected to nasal cannula oxygen Cardiovascular status: blood pressure returned to baseline and stable Postop Assessment: no apparent nausea or vomiting Anesthetic complications: no    Last Vitals:  Vitals:   01/29/19 1045 01/29/19 1115  BP: (!) 129/93 (!) 125/93  Pulse: 60 64  Resp: 16 15  Temp:    SpO2: 95% 94%    Last Pain:  Vitals:   01/29/19 1045  TempSrc:   PainSc: 0-No pain                 Letita Prentiss DAVID

## 2019-01-29 NOTE — Progress Notes (Signed)
   Subjective:    Patient ID: Daniel Everett, male    DOB: January 05, 1932, 85 y.o.   MRN: [id number]  HPI No complaints.  Review of Systems     Objective:   Physical Exam AF VSS  Alert, NAD Left neck incision clean and intact, no fluid collection Drain 5 cc     Assessment & Plan:  Left neck melanoma s/p excision.  Observe overnight.  Will remove drain in am.

## 2019-01-29 NOTE — Interval H&P Note (Signed)
History and Physical Interval Note:  01/29/2019 7:14 AM  Daniel Everett  has presented today for surgery, with the diagnosis of MELANOMA OF LEFT NECK.  The various methods of treatment have been discussed with the patient and family. After consideration of risks, benefits and other options for treatment, the patient has consented to  Procedure(s): WIDE EXCISION OF NECK MELANOMA WITH METHYLENE BLUE/SENTINNEL NODE BIOPSY (Left) as a surgical intervention.  The patient's history has been reviewed, patient examined, no change in status, stable for surgery.  I have reviewed the patient's chart and labs.  Questions were answered to the patient's satisfaction.     [name]

## 2019-01-29 NOTE — Discharge Instructions (Signed)
Keep the neck clean and dry.  Apply bacitracin ointment twice daily.  You may shower starting Sunday but be very careful when you towel yourself dry so that the towel does not get caught on the staples.

## 2019-01-29 NOTE — Op Note (Signed)
OPERATIVE REPORT  DATE OF SURGERY: 01/29/2019  PATIENT:  Daniel Everett,  85 y.o. male  PRE-OPERATIVE DIAGNOSIS:  MELANOMA OF LEFT NECK  POST-OPERATIVE DIAGNOSIS:  MELANOMA OF LEFT NECK  PROCEDURE:  Procedure(s): 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  SURGEON:  Beckie Salts, MD  ASSISTANTS: Jodi Marble MD  ANESTHESIA:   General   EBL: 20 ml  DRAINS: 10 French round JP  LOCAL MEDICATIONS USED: 1% Xylocaine with epinephrine  SPECIMEN: 1.  Wide excision of left neck skin lesion, double long suture marks superior, double short suture marks anterior 2.  Left level 3 lymph node for sentinel node evaluation  COUNTS:  Correct  PROCEDURE DETAILS: The patient was taken to the operating room and placed on the operating table in the supine position. Following induction of general endotracheal anesthesia, the left neck was prepped and draped in a standard fashion.  The biopsy site was identified and a wide excision with 2 cm margins was outlined with a marking pen with continuation obliquely to create a large ellipse of skin.  Electrocautery was used to incise the skin and subcutaneous tissue.  The skin was removed completely, full-thickness and marked with orientation sutures as described.  This was sent for pathologic evaluation.  The platysma was then divided and elevated exposing the sternocleidomastoid muscle.  The external jugular vein was identified.  The cervical branch of the facial nerve was identified and preserved as well.  Dissection medial to the sternocleidomastoid muscle revealed the internal jugular vein and carotid artery with a single level 3 lymph node that was identified.  This was removed and sent for sentinel node evaluation.  Additional dissection or up and down the jugular vein revealed no additional identifiable lymph nodes down to the level of the omohyoid muscle and superiorly up to the mid level 2 region below the digastric muscle.  The wound was  irrigated with saline.  Drain was exited through separate stab incision and secured in place with nylon suture.  Closure was accomplished using deep layer of 3-0 chromic on the platysma and 3-0 Vicryl to reapproximate the skin.  Staples were used on the skin.  There was minimal tension on the closure.  The drain was charged and held suction.  Bacitracin was applied.  Patient was awakened extubated and transferred to recovery in stable condition.    PATIENT DISPOSITION:  To PACU, stable

## 2019-01-29 NOTE — Anesthesia Procedure Notes (Signed)
Procedure Name: Intubation Date/Time: 01/29/2019 8:00 AM Performed by: Imagene Riches, CRNA Pre-anesthesia Checklist: Patient identified, Emergency Drugs available, Suction available and Patient being monitored Patient Re-evaluated:Patient Re-evaluated prior to induction Oxygen Delivery Method: Circle System Utilized Preoxygenation: Pre-oxygenation with 100% oxygen Induction Type: IV induction Ventilation: Mask ventilation without difficulty Laryngoscope Size: Miller and 2 Grade View: Grade I Tube type: Oral Tube size: 7.5 mm Number of attempts: 1 Airway Equipment and Method: Stylet and Oral airway Placement Confirmation: ETT inserted through vocal cords under direct vision,  positive ETCO2 and breath sounds checked- equal and bilateral Secured at: 24 cm Tube secured with: Tape Dental Injury: Teeth and Oropharynx as per pre-operative assessment

## 2019-01-29 NOTE — Transfer of Care (Signed)
Immediate Anesthesia Transfer of Care Note  Patient: Daniel Everett  Procedure(s) Performed: 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 (Left Neck)  Patient Location: PACU  Anesthesia Type:General  Level of Consciousness: awake, alert  and oriented  Airway & Oxygen Therapy: Patient Spontanous Breathing and Patient connected to nasal cannula oxygen  Post-op Assessment: Report given to RN and Post -op Vital signs reviewed and stable  Post vital signs: Reviewed and stable  Last Vitals:  Vitals Value Taken Time  BP 137/101 01/29/19 0902  Temp    Pulse 80 01/29/19 0904  Resp 18 01/29/19 0904  SpO2 100 % 01/29/19 0904  Vitals shown include unvalidated device data.  Last Pain:  Vitals:   01/29/19 0612  TempSrc: Oral  PainSc: 0-No pain         Complications: No apparent anesthesia complications

## 2019-01-30 ENCOUNTER — Encounter (HOSPITAL_COMMUNITY): Payer: Self-pay | Admitting: Otolaryngology

## 2019-01-30 DIAGNOSIS — C434 Malignant melanoma of scalp and neck: Secondary | ICD-10-CM | POA: Diagnosis not present

## 2019-01-30 NOTE — Discharge Summary (Signed)
Physician Discharge Summary  Patient ID: Daniel Everett MRN: [id number] DOB/AGE: February 05, 1932 85 y.o.  Admit date: 01/29/2019 Discharge date: 01/30/2019  Admission Diagnoses: Left neck melanoma  Discharge Diagnoses:  Active Problems:   Melanoma of left side of neck [facility])   Discharged Condition: good  Hospital Course: 85 year old male underwent excision of left neck melanoma.  See operative note.  He was observed overnight with no problems.  His drain was removed on POD 1 and he was felt stable for discharge.  Consults: None  Significant Diagnostic Studies: None  Treatments: surgery: Left neck melanoma excision  Discharge Exam: Blood pressure (!) 141/96, pulse 60, temperature (!) 97.4 F (36.3 C), temperature source Oral, resp. rate 18, height 6\' 4"  (1.93 m), weight 88.9 kg, SpO2 99 %. General appearance: alert, cooperative and no distress Neck: left neck incision clean and intact, no fluid collection, drain removed  Disposition: Discharge disposition: 01-Home or Self Care       Discharge Instructions    Diet - low sodium heart healthy   Complete by: As directed    Discharge instructions   Complete by: As directed    Apply antibiotic ointment to incision twice daily.   Increase activity slowly   Complete by: As directed      Allergies as of 01/30/2019   No Known Allergies     Medication List    TAKE these medications   acetaminophen 650 MG CR tablet Commonly known as: TYLENOL Take 650 mg by mouth 2 (two) times a day.   amLODipine 5 MG tablet Commonly known as: NORVASC Take 1 tablet (5 mg total) by mouth daily.   apixaban 5 MG Tabs tablet Commonly known as: Eliquis Take 1 tablet (5 mg total) by mouth 2 (two) times daily.   atorvastatin 20 MG tablet Commonly known as: LIPITOR TAKE 1 TABLET DAILY   cephALEXin 500 MG capsule Commonly known as: Keflex Take 1 capsule (500 mg total) by mouth 3 (three) times daily.   finasteride 5 MG tablet Commonly known  as: PROSCAR TAKE 1 TABLET DAILY   hydrochlorothiazide 12.5 MG capsule Commonly known as: MICROZIDE Take 1 capsule (12.5 mg total) by mouth daily. What changed: when to take this   HYDROcodone-acetaminophen 7.5-325 MG tablet Commonly known as: Norco Take 1 tablet by mouth every 6 (six) hours as needed for moderate pain.   latanoprost 0.005 % ophthalmic solution Commonly known as: XALATAN Place 1 drop into both eyes at bedtime.   levothyroxine 75 MCG tablet Commonly known as: SYNTHROID TAKE 1 TABLET DAILY What changed: when to take this   lisinopril 20 MG tablet Commonly known as: ZESTRIL TAKE 1 TABLET DAILY   multivitamin with minerals Tabs tablet Take 1 tablet by mouth daily. Senior Plus Multivitamin   omeprazole 20 MG capsule Commonly known as: PRILOSEC TAKE 1 CAPSULE BY MOUTH EVERY DAY What changed:   how much to take  how to take this  when to take this  additional instructions   pyridOXINE 100 MG tablet Commonly known as: VITAMIN B-6 Take 100 mg by mouth daily.   timolol 0.5 % ophthalmic solution Commonly known as: TIMOPTIC Place 1 drop into both eyes 2 (two) times daily.   topiramate 25 MG tablet Commonly known as: TOPAMAX One tablet twice a day for 2 weeks, then take 2 tablets twice a day What changed:   how much to take  how to take this  when to take this   Vitamin D3 50 MCG (  2000 UT) Tabs Take 2,000 Units by mouth daily.      Follow-up Information    [name], MD. Schedule an appointment as soon as possible for a visit in 1 week.   Specialty: Otolaryngology Contact information: [address] [place] [address] [phone]           Signed: Melida Quitter 01/30/2019, 7:40 AM

## 2019-01-30 NOTE — Progress Notes (Signed)
Patient discharged to home. Verbalized understanding of all discharge instructions including incision care, discharge medications and follow up MD visits. 

## 2019-02-03 LAB — SURGICAL PATHOLOGY

## 2019-02-06 ENCOUNTER — Other Ambulatory Visit: Payer: Self-pay | Admitting: Family Medicine

## 2019-02-08 ENCOUNTER — Telehealth: Payer: Self-pay | Admitting: Family Medicine

## 2019-02-08 DIAGNOSIS — C434 Malignant melanoma of scalp and neck: Secondary | ICD-10-CM | POA: Diagnosis not present

## 2019-02-08 MED ORDER — FINASTERIDE 5 MG PO TABS: 5.0000 mg | ORAL_TABLET | Freq: Every day | ORAL | 0 refills | Status: DC

## 2019-02-08 NOTE — Telephone Encounter (Signed)
Medication Refill:  finasteride (PROSCAR) 5 MG tablet [id number]    Pharmacy:     [facility] [place], [place] [facility] [employer] [facility] [phone] (Phone) [phone] (Fax)   Pt states that he is completely out of medication and would like to know if a 2 week supply could be sent to local store    [facility] [place] [place] [id number] (Phone) [phone] (Fax)   Pt aware of turn around time.

## 2019-02-10 ENCOUNTER — Other Ambulatory Visit: Payer: Self-pay

## 2019-02-10 MED ORDER — FINASTERIDE 5 MG PO TABS: 5.0000 mg | ORAL_TABLET | Freq: Every day | ORAL | 0 refills | Status: DC

## 2019-02-10 NOTE — Telephone Encounter (Signed)
Called patient and let him know that I see that someone [facility] [address] and he [facility] so I have sent in a [facility]. Patient verbalized an understanding.

## 2019-02-10 NOTE — Telephone Encounter (Signed)
Patient is calling to say that the Mail order pharmacy is saying they have not received the refill order from the doctor.  Please advise and call to discuss at [phone]

## 2019-02-12 ENCOUNTER — Ambulatory Visit (HOSPITAL_COMMUNITY)
Admission: RE | Admit: 2019-02-12 | Discharge: 2019-02-12 | Disposition: A | Discharge status: Home / Self Care | Payer: Medicare HMO | Source: Ambulatory Visit | Attending: Neurology | Admitting: Neurology

## 2019-02-12 ENCOUNTER — Other Ambulatory Visit: Payer: Self-pay

## 2019-02-12 DIAGNOSIS — G459 Transient cerebral ischemic attack, unspecified: Secondary | ICD-10-CM | POA: Diagnosis not present

## 2019-02-12 NOTE — Progress Notes (Signed)
Carotid duplex has been completed.   Preliminary results in CV Proc.   Abram Sander 02/12/2019 1:16 PM

## 2019-02-13 ENCOUNTER — Telehealth: Payer: Self-pay | Admitting: Neurology

## 2019-02-13 NOTE — Telephone Encounter (Signed)
I called the patient.  The carotid Doppler study was unremarkable.  He is swallowing evaluation did not show a cause for the sensation of getting food stuck in the throat.  The patient still have some numbness on the left neck, he denies any involvement of the upper chest, he claims it is around a surgical site and started after he had sutures.  This may be related to a sensory nerve involvement with the procedure.  He will call if there are any changes in his clinical condition.   Carotid doppler 02/12/19:  Summary: Right Carotid: Velocities in the right ICA are consistent with a 1-39% stenosis.  Left Carotid: Velocities in the left ICA are consistent with a 1-39% stenosis.  Vertebrals: Bilateral vertebral arteries demonstrate antegrade flow.

## 2019-02-22 ENCOUNTER — Ambulatory Visit (INDEPENDENT_AMBULATORY_CARE_PROVIDER_SITE_OTHER): Payer: Medicare HMO | Admitting: *Deleted

## 2019-02-22 DIAGNOSIS — I4821 Permanent atrial fibrillation: Secondary | ICD-10-CM

## 2019-02-22 LAB — CUP PACEART REMOTE DEVICE CHECK
Battery Remaining Longevity: 137 mo
Battery Remaining Percentage: 95.5 %
Battery Voltage: 3.02 V
Brady Statistic RV Percent Paced: 91 %
Date Time Interrogation Session: 20201130020014
Implantable Lead Implant Date: 20200528
Implantable Lead Location: 753860
Implantable Pulse Generator Implant Date: 20200528
Lead Channel Impedance Value: 510 Ohm
Lead Channel Pacing Threshold Amplitude: 0.625 V
Lead Channel Pacing Threshold Pulse Width: 0.5 ms
Lead Channel Sensing Intrinsic Amplitude: 5.8 mV
Lead Channel Setting Pacing Amplitude: 0.875
Lead Channel Setting Pacing Pulse Width: 0.5 ms
Lead Channel Setting Sensing Sensitivity: 2 mV
Pulse Gen Model: 1272
Pulse Gen Serial Number: 9116714

## 2019-02-23 DIAGNOSIS — Z008 Encounter for other general examination: Secondary | ICD-10-CM | POA: Diagnosis not present

## 2019-02-23 DIAGNOSIS — E785 Hyperlipidemia, unspecified: Secondary | ICD-10-CM | POA: Diagnosis not present

## 2019-02-23 DIAGNOSIS — G3184 Mild cognitive impairment, so stated: Secondary | ICD-10-CM | POA: Diagnosis not present

## 2019-02-23 DIAGNOSIS — H409 Unspecified glaucoma: Secondary | ICD-10-CM | POA: Diagnosis not present

## 2019-02-23 DIAGNOSIS — M1990 Unspecified osteoarthritis, unspecified site: Secondary | ICD-10-CM | POA: Diagnosis not present

## 2019-02-23 DIAGNOSIS — G252 Other specified forms of tremor: Secondary | ICD-10-CM | POA: Diagnosis not present

## 2019-02-23 DIAGNOSIS — I10 Essential (primary) hypertension: Secondary | ICD-10-CM | POA: Diagnosis not present

## 2019-02-23 DIAGNOSIS — E039 Hypothyroidism, unspecified: Secondary | ICD-10-CM | POA: Diagnosis not present

## 2019-02-23 DIAGNOSIS — I4891 Unspecified atrial fibrillation: Secondary | ICD-10-CM | POA: Diagnosis not present

## 2019-02-23 DIAGNOSIS — G8929 Other chronic pain: Secondary | ICD-10-CM | POA: Diagnosis not present

## 2019-02-23 DIAGNOSIS — K219 Gastro-esophageal reflux disease without esophagitis: Secondary | ICD-10-CM | POA: Diagnosis not present

## 2019-03-01 ENCOUNTER — Telehealth: Payer: Self-pay

## 2019-03-01 ENCOUNTER — Other Ambulatory Visit: Payer: Self-pay

## 2019-03-01 ENCOUNTER — Ambulatory Visit (INDEPENDENT_AMBULATORY_CARE_PROVIDER_SITE_OTHER): Payer: Medicare HMO

## 2019-03-01 VITALS — BP 110/72 | Temp 97.7°F | Ht 76.0 in | Wt 193.2 lb

## 2019-03-01 DIAGNOSIS — Z0000 Encounter for general adult medical examination without abnormal findings: Secondary | ICD-10-CM | POA: Diagnosis not present

## 2019-03-01 NOTE — Telephone Encounter (Signed)
Copied from [place] [id number]. Topic: General - Other >> Mar 01, 2019  2:42 PM Leward Quan A wrote: Reason for CRM: Patient called to ask for the health coach to give him a call back in reference to his visit this morning. Please contact him at Ph# [phone]

## 2019-03-01 NOTE — Telephone Encounter (Signed)
This message looks like it goes to you!

## 2019-03-01 NOTE — Patient Instructions (Addendum)
Mr. Daniel Everett , Thank you for taking time to come for your Medicare Wellness Visit. I appreciate your ongoing commitment to your health goals. Please review the following plan we discussed and let me know if I can assist you in the future.   Screening recommendations/referrals: Colorectal Screening: performed 05/14/2010; patient will discuss with Dr. Elease Hashimoto at next office visit. He is not sure that he is comfortable not doing them anymore.   Vision and Dental Exams: Recommended annual ophthalmology exams for early detection of glaucoma and other disorders of the eye Recommended annual dental exams for proper oral hygiene. Patient states he sees eye provider twice annually.  Diabetic Exams: Diabetic Eye Exam: N/A Diabetic Foot Exam: N/A  Vaccinations: Influenza vaccine: performed 12/08/2018 Pneumococcal vaccine: performed 06/24/2012 and 02/07/2014 Tdap vaccine: performed 06/24/2012 Shingles vaccine: Please check with your local pharmacy to determine your out of pocket expense for the Shingrix vaccine. You may receive this vaccine at your local pharmacy and it is a series of two injections.  Advanced directives: Advance directives discussed with you today.  Please bring a copy of your POA (Power of [place]) and/or Living Will to your next appointment.  Goals: Recommend to drink at least 6-8 8oz glasses of water per day.Recommend to remove any items from the home that may cause slips or trips.Keep up the good work exercising at home!   Next appointment: Please schedule your Annual Wellness Visit with your Nurse Health Advisor in one year.  Preventive Care 11 Years and Older, Male Preventive care refers to lifestyle choices and visits with your health care provider that can promote health and wellness. What does preventive care include?  A yearly physical exam. This is also called an annual well check.  Dental exams once or twice a year.  Routine eye exams. Ask your health care provider  how often you should have your eyes checked.  Personal lifestyle choices, including:  Daily care of your teeth and gums.  Regular physical activity.  Eating a healthy diet.  Avoiding tobacco and drug use.  Limiting alcohol use.  Practicing safe sex.  Taking low doses of aspirin every day if recommended by your health care provider..  Taking vitamin and mineral supplements as recommended by your health care provider. What happens during an annual well check? The services and screenings done by your health care provider during your annual well check will depend on your age, overall health, lifestyle risk factors, and family history of disease. Counseling  Your health care provider may ask you questions about your:  Alcohol use.  Tobacco use.  Drug use.  Emotional well-being.  Home and relationship well-being.  Sexual activity.  Eating habits.  History of falls.  Memory and ability to understand (cognition).  Work and work Statistician. Screening  You may have the following tests or measurements:  Height, weight, and BMI.  Blood pressure.  Lipid and cholesterol levels. These may be checked every 5 years, or more frequently if you are over 35 years old.  Skin check.  Lung cancer screening. You may have this screening every year starting at age 62 if you have a 30-pack-year history of smoking and currently smoke or have quit within the past 15 years.  Fecal occult blood test (FOBT) of the stool. You may have this test every year starting at age 72.  Flexible sigmoidoscopy or colonoscopy. You may have a sigmoidoscopy every 5 years or a colonoscopy every 10 years starting at age 70.  Prostate cancer screening. Recommendations  will vary depending on your family history and other risks.  Hepatitis C blood test.  Hepatitis B blood test.  Sexually transmitted disease (STD) testing.  Diabetes screening. This is done by checking your blood sugar (glucose) after  you have not eaten for a while (fasting). You may have this done every 1-3 years.  Abdominal aortic aneurysm (AAA) screening. You may need this if you are a current or former smoker.  Osteoporosis. You may be screened starting at age 43 if you are at high risk. Talk with your health care provider about your test results, treatment options, and if necessary, the need for more tests. Vaccines  Your health care provider may recommend certain vaccines, such as:  Influenza vaccine. This is recommended every year.  Tetanus, diphtheria, and acellular pertussis (Tdap, Td) vaccine. You may need a Td booster every 10 years.  Zoster vaccine. You may need this after age 36.  Pneumococcal 13-valent conjugate (PCV13) vaccine. One dose is recommended after age 17.  Pneumococcal polysaccharide (PPSV23) vaccine. One dose is recommended after age 61. Talk to your health care provider about which screenings and vaccines you need and how often you need them. This information is not intended to replace advice given to you by your health care provider. Make sure you discuss any questions you have with your health care provider. Document Released: 04/07/2015 Document Revised: 11/29/2015 Document Reviewed: 01/10/2015 Elsevier Interactive Patient Education  2017 [place] Prevention in the Home Falls can cause injuries. They can happen to people of all ages. There are many things you can do to make your home safe and to help prevent falls. What can I do on the outside of my home?  Regularly fix the edges of walkways and driveways and fix any cracks.  Remove anything that might make you trip as you walk through a door, such as a raised step or threshold.  Trim any bushes or trees on the path to your home.  Use bright outdoor lighting.  Clear any walking paths of anything that might make someone trip, such as rocks or tools.  Regularly check to see if handrails are loose or broken. Make sure  that both sides of any steps have handrails.  Any raised decks and porches should have guardrails on the edges.  Have any leaves, snow, or ice cleared regularly.  Use sand or salt on walking paths during winter.  Clean up any spills in your garage right away. This includes oil or grease spills. What can I do in the bathroom?  Use night lights.  Install grab bars by the toilet and in the tub and shower. Do not use towel bars as grab bars.  Use non-skid mats or decals in the tub or shower.  If you need to sit down in the shower, use a plastic, non-slip stool.  Keep the floor dry. Clean up any water that spills on the floor as soon as it happens.  Remove soap buildup in the tub or shower regularly.  Attach bath mats securely with double-sided non-slip rug tape.  Do not have throw rugs and other things on the floor that can make you trip. What can I do in the bedroom?  Use night lights.  Make sure that you have a light by your bed that is easy to reach.  Do not use any sheets or blankets that are too big for your bed. They should not hang down onto the floor.  Have a firm chair that  has side arms. You can use this for support while you get dressed.  Do not have throw rugs and other things on the floor that can make you trip. What can I do in the kitchen?  Clean up any spills right away.  Avoid walking on wet floors.  Keep items that you use a lot in easy-to-reach places.  If you need to reach something above you, use a strong step stool that has a grab bar.  Keep electrical cords out of the way.  Do not use floor polish or wax that makes floors slippery. If you must use wax, use non-skid floor wax.  Do not have throw rugs and other things on the floor that can make you trip. What can I do with my stairs?  Do not leave any items on the stairs.  Make sure that there are handrails on both sides of the stairs and use them. Fix handrails that are broken or loose. Make  sure that handrails are as long as the stairways.  Check any carpeting to make sure that it is firmly attached to the stairs. Fix any carpet that is loose or worn.  Avoid having throw rugs at the top or bottom of the stairs. If you do have throw rugs, attach them to the floor with carpet tape.  Make sure that you have a light switch at the top of the stairs and the bottom of the stairs. If you do not have them, ask someone to add them for you. What else can I do to help prevent falls?  Wear shoes that:  Do not have high heels.  Have rubber bottoms.  Are comfortable and fit you well.  Are closed at the toe. Do not wear sandals.  If you use a stepladder:  Make sure that it is fully opened. Do not climb a closed stepladder.  Make sure that both sides of the stepladder are locked into place.  Ask someone to hold it for you, if possible.  Clearly mark and make sure that you can see:  Any grab bars or handrails.  First and last steps.  Where the edge of each step is.  Use tools that help you move around (mobility aids) if they are needed. These include:  Canes.  Walkers.  Scooters.  Crutches.  Turn on the lights when you go into a dark area. Replace any light bulbs as soon as they burn out.  Set up your furniture so you have a clear path. Avoid moving your furniture around.  If any of your floors are uneven, fix them.  If there are any pets around you, be aware of where they are.  Review your medicines with your doctor. Some medicines can make you feel dizzy. This can increase your chance of falling. Ask your doctor what other things that you can do to help prevent falls. This information is not intended to replace advice given to you by your health care provider. Make sure you discuss any questions you have with your health care provider. Document Released: 01/05/2009 Document Revised: 08/17/2015 Document Reviewed: 04/15/2014 Elsevier Interactive Patient Education   2017 [employer].

## 2019-03-01 NOTE — Progress Notes (Addendum)
Subjective:   Daniel Everett is a 85 y.o. male who presents for Medicare Annual/Subsequent preventive examination.  Review of Systems:   Cardiac Risk Factors include: advanced age (>5men, >85 women);dyslipidemia;male gender;sedentary lifestyle    Objective:    Vitals: BP 110/72 (BP Location: Left Arm, Patient Position: Sitting)   Temp 97.7 F (36.5 C) (Temporal)   Ht 6\' 4"  (1.93 m)   Wt 193 lb 3.2 oz (87.6 kg)   BMI 23.52 kg/m   Body mass index is 23.52 kg/m.  Advanced Directives 03/01/2019 01/27/2019 08/20/2018 04/10/2018 02/10/2018 10/09/2017 07/09/2017  Does Patient Have a Medical Advance Directive? Yes No Yes No Yes Yes Yes  Type of Paramedic of [place];Living will - [place];Living will - Living will;Healthcare Power of Attorney Living will;Healthcare Power of Attorney -  Does patient want to make changes to medical advance directive? No - Patient declined - No - Patient declined - No - Patient declined No - Patient declined -  Copy of [place] in Chart? No - copy requested - No - copy requested - Yes - validated most recent copy scanned in chart (See row information) Yes -  Would patient like information on creating a medical advance directive? - No - Patient declined - - - - -    Tobacco Social History   Tobacco Use  Smoking Status Never Smoker  Smokeless Tobacco Never Used     Counseling given: Not Answered   Clinical Intake:  Pre-visit preparation completed: Yes  Pain : 0-10 Pain Type: Chronic pain, Other (Comment)(diffuse arthritis pain)    Nutritional Status: BMI of 19-24  Normal Nutritional Risks: None Diabetes: No  How often do you need to have someone help you when you read instructions, pamphlets, or other written materials from your doctor or pharmacy?: 1 - Never  Interpreter Needed?: No  Information entered by :: Franne Forts, LPN.  Past Medical History:  Diagnosis Date  .  Allergic rhinitis due to pollen 06/07/2008  . Atrial fibrillation ([place])   . Benign essential HTN   . COLONIC POLYPS, HX OF 10/28/2006  . DIVERTICULOSIS, COLON 11/27/2006  . Gait abnormality 03/02/2018  . GEN OSTEOARTHROSIS INVOLVING MULTIPLE SITES 01/03/2010  . HYPERGLYCEMIA 10/18/2008  . HYPOTHYROIDISM 11/27/2006  . NEOPLASM, MALIGNANT, PROSTATE, HX OF, S/P TURP 05/28/2007  . OSTEOARTHRITIS 11/27/2006  . Peripheral neuropathy 04/08/2018  . Presence of permanent cardiac pacemaker 08/20/2018  . WEIGHT LOSS, RECENT 11/27/2006   Past Surgical History:  Procedure Laterality Date  . arthroscopy rt knee    . 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 HYDROCELE EXCISION / REPAIR    . INSERT / REPLACE / REMOVE PACEMAKER  08/20/2018  . PACEMAKER IMPLANT N/A 08/20/2018   Procedure: PACEMAKER IMPLANT;  Surgeon: Constance Haw, MD;  Location: [facility];  Service: Cardiovascular;  Laterality: N/A;  . PROSTATE SURGERY     TURP  . ROTATOR CUFF REPAIR     Family History  Problem Relation Age of Onset  . Coronary artery disease Mother   . Heart disease Mother   . Pancreatic cancer Father   . Cancer Father   . Tremor Sister    Social History   Socioeconomic History  . Marital status: Widowed    Spouse name: Not on file  .  Number of children: 3  . Years of education: 4 years college  . Highest education level: Bachelor's degree (e.g., BA, AB, BS)  Occupational History  . Occupation: retired  Scientific laboratory technician  . Financial resource strain: Not on file  . Food insecurity    Worry: Never true    Inability: Never true  . Transportation needs    Medical: No    Non-medical: No  Tobacco Use  . Smoking status: Never Smoker  . Smokeless tobacco: Never Used  Substance and Sexual Activity  . Alcohol use: No  . Drug use: No  . Sexual activity: Not  Currently  Lifestyle  . Physical activity    Days per week: Not on file    Minutes per session: Not on file  . Stress: Not on file  Relationships  . Social connections    Talks on phone: More than three times a week    Gets together: Not on file    Attends religious service: Not on file    Active member of club or organization: Not on file    Attends meetings of clubs or organizations: Not on file    Relationship status: Widowed  Other Topics Concern  . 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 acetaminophen (TYLENOL) 650 MG CR tablet Take 650 mg by mouth 2 (two) times a day.  Marland Kitchen amLODipine (NORVASC) 5 MG tablet Take 1 tablet (5 mg total) by mouth daily.  Marland Kitchen apixaban (ELIQUIS) 5 MG TABS tablet Take 1 tablet (5 mg total) by mouth 2 (two) times daily.  Marland Kitchen atorvastatin (LIPITOR) 20 MG tablet TAKE 1 TABLET DAILY (Patient taking differently: Take 20 mg by mouth daily. )  . diclofenac Sodium (VOLTAREN) 1 % GEL Apply topically.  . finasteride (PROSCAR) 5 MG tablet TAKE 1 TABLET DAILY  . latanoprost (XALATAN) 0.005 % ophthalmic solution Place 1 drop into both eyes at bedtime.   Marland Kitchen levothyroxine (SYNTHROID) 75 MCG tablet TAKE 1 TABLET DAILY (Patient taking differently: Take 75 mcg by mouth daily before breakfast. )  . lisinopril (ZESTRIL) 20 MG tablet TAKE 1 TABLET DAILY (Patient taking differently: Take 20 mg by mouth daily. )  . Multiple Vitamin (MULTIVITAMIN WITH MINERALS) TABS tablet Take 1 tablet by mouth daily. Senior Plus Multivitamin  . omeprazole (PRILOSEC) 20 MG capsule TAKE 1 CAPSULE BY MOUTH EVERY DAY (Patient taking differently: Take 20 mg by mouth daily before supper. )  . timolol (TIMOPTIC) 0.5 % ophthalmic solution Place 1  drop into both eyes 2 (two) times daily.    Marland Kitchen topiramate (TOPAMAX) 25 MG tablet One tablet twice a day for 2 weeks, then take 2 tablets twice a day (Patient taking differently: Take 50 mg by mouth 2 (two) times daily. One tablet twice a day for 2 weeks, then take 2 tablets twice a day)  . cephALEXin (KEFLEX) 500 MG capsule Take 1 capsule (500 mg total) by mouth 3 (three) times daily. (Patient not taking: Reported on 03/01/2019)  . Cholecalciferol (VITAMIN D3) 50 MCG (2000 UT) TABS Take 2,000 Units by mouth daily.  . finasteride (PROSCAR) 5 MG tablet Take 1 tablet (5 mg  total) by mouth daily. (Patient not taking: Reported on 03/01/2019)  . hydrochlorothiazide (MICROZIDE) 12.5 MG capsule Take 1 capsule (12.5 mg total) by mouth daily. (Patient taking differently: Take 12.5 mg by mouth every evening. )  . HYDROcodone-acetaminophen (NORCO) 7.5-325 MG tablet Take 1 tablet by mouth every 6 (six) hours as needed for moderate pain. (Patient not taking: Reported on 03/01/2019)  . pyridOXINE (VITAMIN B-6) 100 MG tablet Take 100 mg by mouth daily.   No facility-administered encounter medications on file as of 03/01/2019.     Activities of Daily Living In your present state of health, do you have any difficulty performing the following activities: 03/01/2019 01/27/2019  Hearing? N N  Vision? Y N  Comment limited vision in left eye -  Difficulty concentrating or making decisions? N N  Walking or climbing stairs? Tempie Donning  Comment stated he would do one step at a time and slowly. He is able to avoid the stairs. -  Dressing or bathing? N N  Doing errands, shopping? N N  Preparing Food and eating ? N -  Using the Toilet? N -  In the past six months, have you accidently leaked urine? Y -  Do you have problems with loss of bowel control? Y -  Comment rarely -  Managing your Medications? N -  Managing your Finances? N -  Housekeeping or managing your Housekeeping? N -  Some recent data might be hidden   Patient  Care Team: [name], MD as PCP - General (Family Medicine) Constance Haw, MD as PCP - Electrophysiology (Cardiology) [place], Peter M, MD as PCP - Cardiology (Cardiology)   Assessment:   This is a routine wellness examination for Daniel Everett.  Exercise Activities and Dietary recommendations Current Exercise Habits: The patient does not participate in regular exercise at present, Exercise limited by: Other - see comments(diffuse joint pain)  Goals    . Exercise 150 min/wk Moderate Activity     [facility] or [facility] settles       Fall Risk Fall Risk  03/01/2019 07/09/2017 06/25/2017 11/28/2015 10/14/2014  Falls in the past year? 1 No No No No  Number falls in past yr: 0 - - - -  Injury with Fall? 0 - - - -  Risk for fall due to : History of fall(s);Impaired balance/gait;Medication side effect - - - -  Follow up Falls evaluation completed;Education provided;Falls prevention discussed - - - -   Is the patient's home free of loose throw rugs in walkways, pet beds, electrical cords, etc?   yes      Grab bars in the bathroom? yes      Handrails on the stairs?   no      Adequate lighting?   yes  Timed Get Up and Go Performed: completed and within decreased timeframe; no gait abnormalities noted    Depression Screen PHQ 2/9 Scores 03/01/2019 10/30/2018 07/09/2017 07/09/2017  PHQ - 2 Score 0 0 0 0  PHQ- 9 Score - 0 - -    Cognitive Function MMSE - Mini Mental State Exam 07/09/2017  Not completed: (No Data)     6CIT Screen 03/01/2019  What Year? 0 points  What month? 0 points  What time? 0 points  Count back from 20 0 points  Months in reverse 0 points  Repeat phrase 2 points  Total Score 2    Immunization History  Administered Date(s) Administered  . Fluad Quad(high  Dose 65+) 12/08/2018  . Influenza Split 12/31/2010, 12/17/2011  . Influenza Whole 03/25/2001, 01/28/2007, 12/10/2007, 02/08/2009, 01/03/2010  . Influenza, High Dose Seasonal PF  02/08/2015, 11/28/2015, 12/25/2016, 01/08/2018  . Influenza,inj,Quad PF,6+ Mos 02/07/2014  . Pneumococcal Conjugate-13 02/07/2014  . Pneumococcal Polysaccharide-23 03/25/2001, 06/24/2012  . Td 03/25/2001, 06/24/2012  . Zoster 03/25/2010    Qualifies for Shingles Vaccine? Yes, information given to patient to check with local pharmacy for most cost effective.   Screening Tests Health Maintenance  Topic Date Due  . TETANUS/TDAP  06/25/2022  . INFLUENZA VACCINE  Completed  . PNA vac Low Risk Adult  Completed   Cancer Screenings: Lung: Low Dose CT Chest recommended if Age 81-80 years, 30 pack-year currently smoking OR have quit w/in 15years. Patient does not qualify. Colorectal: performed in 2012. Patient wishes to discuss this need with Dr. Elease Hashimoto at next appointment. He is not sure that he feels comfortable with no longer doing colonoscopies.   Additional Screenings:  Hepatitis C Screening: N/A due to age      Plan:   [facility]khon [facility] and now he's exercising in his home. His children are helpful to him and get his groceries. He has two sisters that cook and bring food to him weekly. He is able to warm it in the microwave and states he eats well. He will check with his local pharmacy for his out of pocket cost for shingrix vaccines. Information provided to him today about Shingrix.   I have personally reviewed and noted the following in the patient's chart:   . Medical and social history . Use of alcohol, tobacco or illicit drugs  . Current medications and supplements . Functional ability and status . Nutritional status . Physical activity . Advanced directives . List of other physicians . Hospitalizations, surgeries, and ER visits in previous 12 months . Vitals . Screenings to include cognitive, depression, and falls . Referrals and appointments  In addition, I have reviewed and discussed with patient certain preventive protocols,  quality metrics, and best practice recommendations. A written personalized care plan for preventive services as well as general preventive health recommendations were provided to patient.     Franne Forts, LPN  [id number]  I have reviewed the documentation for the [facility] and [place] provided by the health coach and agree with their documentation. I was immediately available for any questions  [name] MD [facility]

## 2019-03-04 ENCOUNTER — Encounter: Payer: Self-pay | Admitting: Family Medicine

## 2019-03-04 ENCOUNTER — Other Ambulatory Visit (HOSPITAL_COMMUNITY): Payer: Self-pay | Admitting: Otolaryngology

## 2019-03-04 ENCOUNTER — Other Ambulatory Visit: Payer: Self-pay | Admitting: Otolaryngology

## 2019-03-04 ENCOUNTER — Other Ambulatory Visit: Payer: Self-pay

## 2019-03-04 ENCOUNTER — Ambulatory Visit (INDEPENDENT_AMBULATORY_CARE_PROVIDER_SITE_OTHER): Payer: Medicare HMO | Admitting: Family Medicine

## 2019-03-04 DIAGNOSIS — M1711 Unilateral primary osteoarthritis, right knee: Secondary | ICD-10-CM | POA: Diagnosis not present

## 2019-03-04 DIAGNOSIS — C434 Malignant melanoma of scalp and neck: Secondary | ICD-10-CM

## 2019-03-04 NOTE — Progress Notes (Signed)
[facility] [address] [place], [place] [address] Phone: [phone] Subjective:   I Daniel Everett am serving as a Education administrator for Dr. Hulan Saas.  This visit occurred during the SARS-CoV-2 public health emergency.  Safety protocols were in place, including screening questions prior to the visit, additional usage of staff PPE, and extensive cleaning of exam room while observing appropriate contact time as indicated for disinfecting solutions.   I'm seeing this patient by the request  of:    CC: Knee pain  [id number]   01/26/2019 Repeat injection given today, discussed icing regimen and home exercise, discussed which activities to do which wants to avoid.  Patient will increase activity slowly over the course of next several weeks.  Could be a candidate for viscosupplementation which she does have approval for.  We will consider that at follow-up if continuing to have pain.  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  03/04/2019 Daniel Everett is a 85 y.o. male coming in with complaint of right knee pain. Patient states the his knee feel about the same.  Feels that the steroid injection did not make a significant improvement.  Patient though unfortunately continues to have some instability of the knee.  Looking for some relief so he can do some other activities on a more regular basis again.       Past Medical History:  Diagnosis Date  . Allergic rhinitis due to pollen 06/07/2008  . Atrial fibrillation ([place])   . Benign essential HTN   . COLONIC POLYPS, HX OF 10/28/2006  . DIVERTICULOSIS, COLON 11/27/2006  . Gait abnormality 03/02/2018  . GEN OSTEOARTHROSIS INVOLVING MULTIPLE SITES 01/03/2010  . HYPERGLYCEMIA 10/18/2008  . HYPOTHYROIDISM 11/27/2006  . NEOPLASM, MALIGNANT, PROSTATE, HX OF, S/P TURP 05/28/2007  . OSTEOARTHRITIS 11/27/2006  . Peripheral neuropathy 04/08/2018  . Presence of permanent cardiac pacemaker 08/20/2018  .  WEIGHT LOSS, RECENT 11/27/2006   Past Surgical History:  Procedure Laterality Date  . arthroscopy rt knee    . EXCISION MASS NECK Left 01/29/2019   Procedure: 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;  Surgeon: [name], MD;  Location: [facility];  Service: ENT;  Laterality: Left;  . HYDROCELE EXCISION / REPAIR    . INSERT / REPLACE / REMOVE PACEMAKER  08/20/2018  . PACEMAKER IMPLANT N/A 08/20/2018   Procedure: PACEMAKER IMPLANT;  Surgeon: Constance Haw, MD;  Location: [facility];  Service: Cardiovascular;  Laterality: N/A;  . PROSTATE SURGERY     TURP  . ROTATOR CUFF REPAIR     Social History   Socioeconomic History  . Marital status: Widowed    Spouse name: Not on file  . Number of children: 3  . Years of education: 4 years college  . Highest education level: Bachelor's degree (e.g., BA, AB, BS)  Occupational History  . Occupation: retired  Tobacco Use  . Smoking status: Never Smoker  . Smokeless tobacco: Never Used  Substance and Sexual Activity  . Alcohol use: No  . Drug use: No  . Sexual activity: Not Currently  Other Topics Concern  . 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 Difficulty of Paying Living Expenses: Not on file  Food Insecurity: No Food Insecurity  . Worried About Charity fundraiser in the Last Year: Never true  . Ran Out of Food in the Last Year: Never true  Transportation Needs: No Transportation Needs  . Lack of Transportation (Medical): No  . Lack of Transportation (Non-Medical): No  Physical Activity:   . Days of Exercise per Week: Not on file  . Minutes of Exercise per Session: Not on file  Stress:   . Feeling of Stress : Not on file  Social Connections: Unknown   . Frequency of Communication with Friends and Family: More than three times a week  . Frequency of Social Gatherings with Friends and Family: Not on file  . Attends Religious Services: Not on file  . Active Member of Clubs or Organizations: Not on file  . Attends Archivist Meetings: Not on file  . Marital Status: Widowed   No Known Allergies Family History  Problem Relation Age of Onset  . Coronary artery disease Mother   . Heart disease Mother   . Pancreatic cancer Father   . Cancer Father   . Tremor Sister     Current Outpatient Medications (Endocrine & Metabolic):  .  levothyroxine (SYNTHROID) 75 MCG tablet, TAKE 1 TABLET DAILY (Patient taking differently: Take 75 mcg by mouth daily before breakfast. )  Current Outpatient Medications (Cardiovascular):  .  amLODipine (NORVASC) 5 MG tablet, Take 1 tablet (5 mg total) by mouth daily. Marland Kitchen  atorvastatin (LIPITOR) 20 MG tablet, TAKE 1 TABLET DAILY (Patient taking differently: Take 20 mg by mouth daily. ) .  lisinopril (ZESTRIL) 20 MG tablet, TAKE 1 TABLET DAILY (Patient taking differently: Take 20 mg by mouth daily. ) .  hydrochlorothiazide (MICROZIDE) 12.5 MG capsule, Take 1 capsule (12.5 mg total) by mouth daily. (Patient taking differently: Take 12.5 mg by mouth every evening. )   Current Outpatient Medications (Analgesics):  .  acetaminophen (TYLENOL) 650 MG CR tablet, Take 650 mg by mouth 2 (two) times a day. Marland Kitchen  HYDROcodone-acetaminophen (NORCO) 7.5-325 MG tablet, Take 1 tablet by mouth every 6 (six) hours as needed for moderate pain.  Current Outpatient Medications (Hematological):  .  apixaban (ELIQUIS) 5 MG TABS tablet, Take 1 tablet (5 mg total) by mouth 2 (two) times daily.  Current Outpatient Medications (Other):  .  cephALEXin (KEFLEX) 500 MG capsule, Take 1 capsule (500 mg total) by mouth 3 (three) times daily. .  Cholecalciferol (VITAMIN D3) 50 MCG (2000 UT) TABS, Take 2,000 Units by mouth daily. .   diclofenac Sodium (VOLTAREN) 1 % GEL, Apply topically. .  finasteride (PROSCAR) 5 MG tablet, TAKE 1 TABLET DAILY .  finasteride (PROSCAR) 5 MG tablet, Take 1 tablet (5 mg total) by mouth daily. Marland Kitchen  latanoprost (XALATAN) 0.005 % ophthalmic solution, Place 1 drop into both eyes at bedtime.  .  Multiple Vitamin (MULTIVITAMIN WITH MINERALS) TABS tablet, Take 1 tablet by mouth daily. Senior Plus Multivitamin .  omeprazole (PRILOSEC) 20 MG capsule, TAKE 1 CAPSULE BY MOUTH EVERY DAY (Patient taking differently: Take 20 mg by mouth daily before supper. ) .  pyridOXINE (VITAMIN B-6) 100 MG tablet, Take 100 mg by mouth daily. .  timolol (TIMOPTIC) 0.5 % ophthalmic solution, Place 1 drop into both eyes 2 (two) times daily.   Marland Kitchen  topiramate (TOPAMAX) 25 MG tablet, One tablet twice a day for  2 weeks, then take 2 tablets twice a day (Patient taking differently: Take 50 mg by mouth 2 (two) times daily. One tablet twice a day for 2 weeks, then take 2 tablets twice a day)    Past medical history, social, surgical and family history all reviewed in electronic medical record.  No pertanent information unless stated regarding to the chief complaint.   Review of Systems:  No headache, visual changes, nausea, vomiting, diarrhea, constipation, dizziness, abdominal pain, skin rash, fevers, chills, night sweats, weight loss, swollen lymph nodes, body aches, joint swelling,  chest pain, shortness of breath, mood changes.  Positive muscle aches  Objective  Blood pressure 120/90, height 6\' 4"  (1.93 m), weight 195 lb (88.5 kg).    General: No apparent distress alert and oriented x3 mood and affect normal, dressed appropriately.  HEENT: Pupils equal, extraocular movements intact  Respiratory: Patient's speak in full sentences and does not appear short of breath  Cardiovascular: No lower extremity edema, non tender, no erythema  Skin: Warm dry intact with no signs of infection or rash on extremities or on axial skeleton.   Abdomen: Soft nontender  Neuro: Cranial nerves II through XII are intact, neurovascularly intact in all extremities with 2+ DTRs and 2+ pulses.  Lymph: No lymphadenopathy of posterior or anterior cervical chain or axillae bilaterally.  Gait antalgic MSK:  tender with full range of motion and good stability and symmetric strength and tone of shoulders, elbows, wrist, hip, and ankles bilaterally.  Arthritic changes of multiple joints  Knee: Right valgus deformity noted.  Abnormal thigh to calf ratio.  Joint effusion noted Tender to palpation over medial and PF joint line.  ROM full in flexion and extension and lower leg rotation. instability with valgus force.  painful patellar compression. Patellar glide with moderate crepitus. Patellar and quadriceps tendons unremarkable. Hamstring and quadriceps strength is normal. Contralateral knee shows arthritic changes but no significant instability   After informed written and verbal consent, patient was seated on exam table. Right knee was prepped with alcohol swab and utilizing anterolateral approach, patient's right knee space was injected with1 22 mg/mL of Monovisc (sodium hyaluronate) in a prefilled syringe was injected easily into the knee through a 22-gauge needle..Patient tolerated the procedure well without immediate complications.   Impression and Recommendations:     This case required medical decision making of moderate complexity. The above documentation has been reviewed and is accurate and complete Lyndal Pulley, DO       Note: This dictation was prepared with Dragon dictation along with smaller phrase technology. Any transcriptional errors that result from this process are unintentional.

## 2019-03-04 NOTE — Assessment & Plan Note (Signed)
Viscosupplementation given prescription for right patient responded well hopefully to this morning.

## 2019-03-04 NOTE — Patient Instructions (Signed)
  [address]st [address] [place], [place] [address] Phone [phone]  Gel injection today See me again in 6 weeks

## 2019-03-16 ENCOUNTER — Encounter (HOSPITAL_COMMUNITY)
Admission: RE | Admit: 2019-03-16 | Discharge: 2019-03-16 | Disposition: A | Discharge status: Home / Self Care | Payer: Medicare HMO | Source: Ambulatory Visit | Attending: Otolaryngology | Admitting: Otolaryngology

## 2019-03-16 ENCOUNTER — Other Ambulatory Visit: Payer: Self-pay

## 2019-03-16 DIAGNOSIS — I77811 Abdominal aortic ectasia: Secondary | ICD-10-CM | POA: Insufficient documentation

## 2019-03-16 DIAGNOSIS — I712 Thoracic aortic aneurysm, without rupture: Secondary | ICD-10-CM | POA: Diagnosis not present

## 2019-03-16 DIAGNOSIS — R590 Localized enlarged lymph nodes: Secondary | ICD-10-CM | POA: Insufficient documentation

## 2019-03-16 DIAGNOSIS — C434 Malignant melanoma of scalp and neck: Secondary | ICD-10-CM | POA: Diagnosis not present

## 2019-03-16 DIAGNOSIS — I700 Atherosclerosis of aorta: Secondary | ICD-10-CM | POA: Diagnosis not present

## 2019-03-16 LAB — GLUCOSE, CAPILLARY: Glucose-Capillary: 123 mg/dL — ABNORMAL HIGH (ref 70–99)

## 2019-03-16 MED ORDER — FLUDEOXYGLUCOSE F - 18 (FDG) INJECTION
9.7800 | Freq: Once | INTRAVENOUS | Status: AC | PRN
  Administered 2019-03-16: 9.78 via INTRAVENOUS

## 2019-03-17 NOTE — Progress Notes (Signed)
PPM remote 

## 2019-03-24 ENCOUNTER — Telehealth: Payer: Self-pay | Admitting: Oncology

## 2019-03-24 NOTE — Telephone Encounter (Signed)
Received a new patient referral from Dr. Constance Holster for malignant melanoma of scalp and neck. Pt has been cld and scheduled to see Dr. Alen Blew on 1/5 at 11am.

## 2019-03-30 ENCOUNTER — Other Ambulatory Visit: Payer: Self-pay

## 2019-03-30 ENCOUNTER — Telehealth: Payer: Self-pay

## 2019-03-30 ENCOUNTER — Inpatient Hospital Stay: Payer: Medicare HMO | Attending: Oncology | Admitting: Oncology

## 2019-03-30 ENCOUNTER — Other Ambulatory Visit: Payer: Self-pay | Admitting: Neurology

## 2019-03-30 VITALS — BP 122/88 | HR 65 | Temp 98.6°F | Resp 18 | Ht 76.0 in | Wt 197.0 lb

## 2019-03-30 DIAGNOSIS — Z8546 Personal history of malignant neoplasm of prostate: Secondary | ICD-10-CM

## 2019-03-30 DIAGNOSIS — Z809 Family history of malignant neoplasm, unspecified: Secondary | ICD-10-CM | POA: Diagnosis not present

## 2019-03-30 DIAGNOSIS — I4891 Unspecified atrial fibrillation: Secondary | ICD-10-CM

## 2019-03-30 DIAGNOSIS — R599 Enlarged lymph nodes, unspecified: Secondary | ICD-10-CM | POA: Diagnosis not present

## 2019-03-30 DIAGNOSIS — C434 Malignant melanoma of scalp and neck: Secondary | ICD-10-CM

## 2019-03-30 DIAGNOSIS — Z800 Family history of malignant neoplasm of digestive organs: Secondary | ICD-10-CM

## 2019-03-30 NOTE — Progress Notes (Signed)
Reason for the request: Left neck melanoma  HPI: I was asked by Dr. Constance Holster to evaluate Daniel Everett for new diagnosis of melanoma.  He is an 86 year old man with history of prostate cancer and atrial fibrillation found to have melanoma on the left side of his neck noted on a biopsy in October 2020.   At that time he was found to have 1.2 mm thickness with positive margins.  He is subsequently evaluated by Dr. Constance Holster and underwent wide excision and sentinel lymph node biopsy completed on November 6 of 2020.  The final pathology showed residual melanoma with the negative margins noted at that time.  His thickness of the residual invasive melanoma 0.6 mm.  A final pathological staging was T2AN0.  0 out of 1 lymph node showed involved malignancy.  PET CT scan obtained on March 16, 2019 showed mildly enlarged left axillary lymph node with borderline hypermetabolism they could not rule out malignancy.  Clinically, he has no complaints at this time.  Continues to live independently and attends activities of daily living.  He continues to follow with dermatology because of his recurrent skin cancers.  He does not report any headaches, blurry vision, syncope or seizures. Does not report any fevers, chills or sweats.  Does not report any cough, wheezing or hemoptysis.  Does not report any chest pain, palpitation, orthopnea or leg edema.  Does not report any nausea, vomiting or abdominal pain.  Does not report any constipation or diarrhea.  Does not report any skeletal complaints.    Does not report frequency, urgency or hematuria.  Does not report any skin rashes or lesions. Does not report any heat or cold intolerance.  Does not report any lymphadenopathy or petechiae.  Does not report any anxiety or depression.  Remaining review of systems is negative.    Past Medical History:  Diagnosis Date  . Allergic rhinitis due to pollen 06/07/2008  . Atrial fibrillation ([facility])   . Benign essential HTN   . COLONIC POLYPS, HX  OF 10/28/2006  . DIVERTICULOSIS, COLON 11/27/2006  . Gait abnormality 03/02/2018  . GEN OSTEOARTHROSIS INVOLVING MULTIPLE SITES 01/03/2010  . HYPERGLYCEMIA 10/18/2008  . HYPOTHYROIDISM 11/27/2006  . NEOPLASM, MALIGNANT, PROSTATE, HX OF, S/P TURP 05/28/2007  . OSTEOARTHRITIS 11/27/2006  . Peripheral neuropathy 04/08/2018  . Presence of permanent cardiac pacemaker 08/20/2018  . WEIGHT LOSS, RECENT 11/27/2006  :  Past Surgical History:  Procedure Laterality Date  . arthroscopy rt knee    . EXCISION MASS NECK Left 01/29/2019   Procedure: 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;  Surgeon: [name], MD;  Location: [facility];  Service: ENT;  Laterality: Left;  . HYDROCELE EXCISION / REPAIR    . INSERT / REPLACE / REMOVE PACEMAKER  08/20/2018  . PACEMAKER IMPLANT N/A 08/20/2018   Procedure: PACEMAKER IMPLANT;  Surgeon: Constance Haw, MD;  Location: [facility];  Service: Cardiovascular;  Laterality: N/A;  . PROSTATE SURGERY     TURP  . ROTATOR CUFF REPAIR    :   Current Outpatient Medications:  .  acetaminophen (TYLENOL) 650 MG CR tablet, Take 650 mg by mouth 2 (two) times a day., Disp: , Rfl:  .  amLODipine (NORVASC) 5 MG tablet, Take 1 tablet (5 mg total) by mouth daily., Disp: 90 tablet, Rfl: 3 .  apixaban (ELIQUIS) 5 MG TABS tablet, Take 1 tablet (5 mg total) by mouth 2 (two) times daily., Disp: 180 tablet, Rfl: 3 .  atorvastatin (  LIPITOR) 20 MG tablet, TAKE 1 TABLET DAILY (Patient taking differently: Take 20 mg by mouth daily. ), Disp: 90 tablet, Rfl: 3 .  cephALEXin (KEFLEX) 500 MG capsule, Take 1 capsule (500 mg total) by mouth 3 (three) times daily., Disp: 15 capsule, Rfl: 0 .  Cholecalciferol (VITAMIN D3) 50 MCG (2000 UT) TABS, Take 2,000 Units by mouth daily., Disp: , Rfl:  .  diclofenac Sodium (VOLTAREN) 1 % GEL, Apply topically., Disp: , Rfl:  .  finasteride (PROSCAR) 5 MG tablet, TAKE 1 TABLET DAILY, Disp: 90 tablet, Rfl: 1 .  finasteride (PROSCAR) 5 MG  tablet, Take 1 tablet (5 mg total) by mouth daily., Disp: 90 tablet, Rfl: 0 .  hydrochlorothiazide (MICROZIDE) 12.5 MG capsule, Take 1 capsule (12.5 mg total) by mouth daily. (Patient taking differently: Take 12.5 mg by mouth every evening. ), Disp: 90 capsule, Rfl: 1 .  HYDROcodone-acetaminophen (NORCO) 7.5-325 MG tablet, Take 1 tablet by mouth every 6 (six) hours as needed for moderate pain., Disp: 20 tablet, Rfl: 0 .  latanoprost (XALATAN) 0.005 % ophthalmic solution, Place 1 drop into both eyes at bedtime. , Disp: , Rfl:  .  levothyroxine (SYNTHROID) 75 MCG tablet, TAKE 1 TABLET DAILY (Patient taking differently: Take 75 mcg by mouth daily before breakfast. ), Disp: 90 tablet, Rfl: 2 .  lisinopril (ZESTRIL) 20 MG tablet, TAKE 1 TABLET DAILY (Patient taking differently: Take 20 mg by mouth daily. ), Disp: 90 tablet, Rfl: 2 .  Multiple Vitamin (MULTIVITAMIN WITH MINERALS) TABS tablet, Take 1 tablet by mouth daily. Senior Plus Multivitamin, Disp: , Rfl:  .  omeprazole (PRILOSEC) 20 MG capsule, TAKE 1 CAPSULE BY MOUTH EVERY DAY (Patient taking differently: Take 20 mg by mouth daily before supper. ), Disp: 90 capsule, Rfl: 1 .  pyridOXINE (VITAMIN B-6) 100 MG tablet, Take 100 mg by mouth daily., Disp: , Rfl:  .  timolol (TIMOPTIC) 0.5 % ophthalmic solution, Place 1 drop into both eyes 2 (two) times daily.  , Disp: , Rfl:  .  topiramate (TOPAMAX) 25 MG tablet, Take 2 tablets (50 mg total) by mouth 2 (two) times daily., Disp: 360 tablet, Rfl: 0:  No Known Allergies:  Family History  Problem Relation Age of Onset  . Coronary artery disease Mother   . Heart disease Mother   . Pancreatic cancer Father   . Cancer Father   . Tremor Sister   :  Social History   Socioeconomic History  . Marital status: Widowed    Spouse name: Not on file  . Number of children: 3  . Years of education: 4 years college  . Highest education level: Bachelor's degree (e.g., BA, AB, BS)  Occupational History  .  Occupation: retired  Tobacco Use  . Smoking status: Never Smoker  . Smokeless tobacco: Never Used  Substance and Sexual Activity  . Alcohol use: No  . Drug use: No  . Sexual activity: Not Currently  Other Topics Concern  . 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 Difficulty of Paying Living Expenses: Not on file  Food Insecurity: No Food Insecurity  . Worried About [employer] of  Food in the Last Year: Never true  . Ran Out of Food in the Last Year: Never true  Transportation Needs: No Transportation Needs  . Lack of Transportation (Medical): No  . Lack of Transportation (Non-Medical): No  Physical Activity:   . Days of Exercise per Week: Not on file  . Minutes of Exercise per Session: Not on file  Stress:   . Feeling of Stress : Not on file  Social Connections: Unknown  . Frequency of Communication with Friends and Family: More than three times a week  . Frequency of Social Gatherings with Friends and Family: Not on file  . Attends Religious Services: Not on file  . Active Member of Clubs or Organizations: Not on file  . Attends Archivist Meetings: Not on file  . Marital Status: Widowed  Intimate Partner Violence:   . Fear of Current or Ex-Partner: Not on file  . Emotionally Abused: Not on file  . Physically Abused: Not on file  . Sexually Abused: Not on file  :  Pertinent items are noted in HPI.  Exam: Blood pressure 122/88, pulse 65, temperature 98.6 F (37 C), temperature source Temporal, resp. rate 18, height _0  (1.93 m), weight 197 lb (89.4 kg), SpO2 100 %.  ECOG 1 General appearance: alert and cooperative appeared without distress. Head: atraumatic without any abnormalities. Eyes:  conjunctivae/corneas clear. PERRL.  Sclera anicteric. Throat: lips, mucosa, and tongue normal; without oral thrush or ulcers. Resp: clear to auscultation bilaterally without rhonchi, wheezes or dullness to percussion. Cardio: regular rate and rhythm, S1, S2 normal, no murmur, click, rub or gallop GI: soft, non-tender; bowel sounds normal; no masses,  no organomegaly Skin: Skin color, texture, turgor normal. No rashes or lesions.  Well-healed scar on his left neck. Lymph nodes: Cervical, supraclavicular, and axillary nodes normal. Neurologic: Grossly normal without any motor, sensory or deep tendon reflexes. Musculoskeletal: No joint deformity or effusion.   NM PET Image Initial (PI) Whole Body  Result Date: 03/16/2019 CLINICAL DATA:  Initial treatment strategy for left neck melanoma. EXAM: NUCLEAR MEDICINE PET WHOLE BODY TECHNIQUE: 9.8 mCi F-18 FDG was injected intravenously. Full-ring PET imaging was performed from the skull base to thigh after the radiotracer. CT data was obtained and used for attenuation correction and anatomic localization. Fasting blood glucose: 123 mg/dl COMPARISON:  None. FINDINGS: Mediastinal blood pool activity: SUV max 2.8 HEAD/NECK: No hypermetabolic activity in the scalp. No hypermetabolic cervical lymph nodes. Incidental CT findings: Patchy opacification of the left sphenoid sinus without fluid levels. CHEST: Enlarged 1.6 cm short axis diameter left axillary lymph node (series 4/image 89) with borderline mild hypermetabolism with max SUV 2.8. Otherwise no enlarged or hypermetabolic axillary, mediastinal or convincing hilar lymph nodes. No hypermetabolic pulmonary findings. Incidental CT findings: Cardiomegaly. Single lead left subclavian pacemaker noted with lead tip in the right ventricle. Coronary atherosclerosis. Atherosclerotic thoracic aorta with 4.6 cm ascending thoracic aortic aneurysm. No acute consolidative airspace disease, lung masses or significant pulmonary  nodules. ABDOMEN/PELVIS: No abnormal hypermetabolic activity within the liver, pancreas, adrenal glands, or spleen. No hypermetabolic lymph nodes in the abdomen or pelvis. Incidental CT findings: Several simple scattered liver cysts, largest 3.7 cm at right liver dome. Additional subcentimeter scattered hypodense liver lesions are too small to characterize. Simple exophytic 3.6 cm posterior interpolar left renal cyst. Atherosclerotic abdominal aorta with ectatic 2.5 cm infrarenal abdominal aorta. Mild sigmoid diverticulosis. Fiducial markers associated with the prostate. SKELETON: No focal hypermetabolic activity to suggest skeletal metastasis. Incidental  CT findings: none EXTREMITIES: No abnormal hypermetabolic activity in the lower extremities. Incidental CT findings: none IMPRESSION: 1. Mildly enlarged left axillary lymph node with borderline mild hypermetabolism, cannot exclude an isolated nodal metastasis. 2. Otherwise no evidence of hypermetabolic metastatic disease. No hypermetabolic skin lesions. 3. Ascending thoracic aortic 4.6 cm aneurysm. Ascending thoracic aortic aneurysm. Recommend semi-annual imaging followup by CTA or MRA and referral to cardiothoracic surgery if not already obtained. This recommendation follows 2010 ACCF/AHA/AATS/ACR/ASA/SCA/SCAI/SIR/STS/SVM Guidelines for the Diagnosis and Management of Patients With Thoracic Aortic Disease. Circulation. 2010; 121: Z601-U932. Aortic aneurysm NOS (ICD10-I71.9). 4. Ectatic 2.6 cm infrarenal abdominal aorta, at risk for aneurysm development. Recommend follow-up aortic ultrasound in 5 years. This recommendation follows ACR consensus guidelines: White Paper of the ACR Incidental Findings Committee II on Vascular Findings. J Am Coll Radiol 2013; 35:573-220. 5.  Aortic Atherosclerosis (ICD10-I70.0). Electronically Signed   By: Ilona Sorrel M.D.   On: 03/16/2019 10:36    Assessment and Plan:   86 year old man with:  1.  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  He was found to have 1.2 mm without ulceration or lymphovascular invasion.  His initial shave biopsy in October showed positive margins.  He subsequently underwent wide excision in November 2020 with no residual tumor and final pathology showed a T2a N0 with 0 out of 1 lymph node involved PET CT scan obtained on 03/16/2019 showed no clear evidence of metastatic disease with borderline enlargement of the left axillary lymph node.  The natural course of this disease was reviewed today.  Imaging studies as well as pathology review were discussed with the patient.  At this time, I do not see any clear-cut evidence of metastatic disease that requires active intervention.  I have recommended active surveillance with repeat imaging studies in 6 months given his borderline enlarged lymph node.  It is reasonable to consider suspending imaging studies after that if no evidence of metastatic disease is noted.  If systemic disease is detected indicating stage IV metastasis.  Treatment options were reviewed today which include single agent immunotherapy, immunotherapy doublets and oral targeted therapy if he harbors BRAF mutation.  Complication associated with these therapies as well as overall prognosis were reviewed in the setting.  2.  Dermatology surveillance: He continues to follow with dermatology regularly given his history of skin cancer.  3.  Follow-up: Will be in 6 months after repeat imaging studies.  60  minutes was spent caring for this patient.  More than 50% of time was dedicated to reviewing imaging studies, reviewing pathology reports, discussing treatment options as well as future plan of care.     Thank you for the referral.  I had the pleasure of meeting this patient today.  A copy of this consult has been forwarded to the requesting physician.

## 2019-03-30 NOTE — Telephone Encounter (Signed)
1) Medication(s) Requested (by name): topiramate (TOPAMAX) 25 MG tablet  2) Pharmacy of Choice: [facility], [place]  [id number], [place] [address]   3) Special Requests:  Patient called stating this medication does not seem to be working for him and his tremors and would like to know if any changes are needing to be done before he picks up his refill Please follow up

## 2019-03-30 NOTE — Telephone Encounter (Signed)
Called patient and confirmed he has been taking topamax 50 mg twice daily. He stated he hasn't noticed any benefit for his tremors at all. He wants to know what to do before picking up the refill. I advised that as work in [place] I will send to Dr Leta Baptist and call him back. Patient verbalized understanding, appreciation.

## 2019-03-30 NOTE — Telephone Encounter (Signed)
Spoke with patient and advised him of Dr [employer] advice.  He stated he will call back next week to see if Dr Jannifer Franklin is in office. He will continue taking topamax. Patient verbalized understanding, appreciation.

## 2019-03-30 NOTE — Telephone Encounter (Signed)
Stay on TPX for now until Dr. Jannifer Franklin can address in next 1-2 weeks. -VRP

## 2019-03-31 ENCOUNTER — Telehealth: Payer: Self-pay | Admitting: Oncology

## 2019-03-31 NOTE — Telephone Encounter (Signed)
The patient verbalized understanding of Dr. Gladstone Lighter advice. He would like a call from Dr. Jannifer Franklin when he returns to the office.

## 2019-03-31 NOTE — Telephone Encounter (Signed)
Scheduled appt per 1/5 los, sent a message to HIM pool to get a calendar mailed out. 

## 2019-03-31 NOTE — Telephone Encounter (Signed)
Pt called back wanting to know if he would be able to see Dr. Jannifer Franklin when he is back in the office to see if his topiramate (TOPAMAX) 25 MG tablet will be changed or will he be getting a refill. Please adivise.

## 2019-04-03 MED ORDER — PREGABALIN 50 MG PO CAPS: ORAL_CAPSULE | ORAL | 2 refills | Status: DC

## 2019-04-03 NOTE — Telephone Encounter (Signed)
I called the patient.  The patient indicates that he believes that as he has gone up on the Topamax the tremor has worsened.  We will cut him back to 25 mg twice daily for 1 week and then stop the drug.  In the past, he was on a very low-dose of gabapentin without benefit but he did not really give this a good trial.  I may give him Lyrica instead taking 50 mg daily for 1 week and then go to 50 mg twice daily, look out for drowsiness.  He will call for any dose adjustments.

## 2019-04-03 NOTE — Addendum Note (Signed)
Addended by: Kathrynn Ducking on: 04/03/2019 03:58 PM   Modules accepted: Orders

## 2019-04-05 ENCOUNTER — Telehealth: Payer: Self-pay | Admitting: Neurology

## 2019-04-05 NOTE — Telephone Encounter (Signed)
I attempted to completed the PA via cover my meds but eligibility could not be verified.  I attempted to reached [employer] via telephone call at [phone], but was unable to connect with a representative after holding for 10 minutes. Will try again on 04/06/2019.

## 2019-04-05 NOTE — Telephone Encounter (Signed)
Pt is asking for a call from RN to discuss his topiramate (TOPAMAX) 25 MG tablet.  Pt said the most recent script is not covered by his insurance.  Please call

## 2019-04-05 NOTE — Telephone Encounter (Signed)
Dr. Jannifer Franklin instructed patient to taper off topiramate.  He sent in new rx for Lyrica 50mg . Per [facility], Lyrica needs a prior authorization.  We will work on this today for the patient.

## 2019-04-05 NOTE — Telephone Encounter (Signed)
I spoke to the patient and notified him that our office is working on prior authorization for Lyrica.  He would like a call to either confirm approval or for Dr.Willis to change his therapy.

## 2019-04-06 ENCOUNTER — Telehealth: Payer: Self-pay | Admitting: Neurology

## 2019-04-06 NOTE — Telephone Encounter (Signed)
I reached out to [place] spoke with rep Lorriane Shire and completed the verbal PA today 04/06/2019. PA decision should be reached within the next 24 hours.  Case # is [id number] and our office will be notified via fax of the determination. I attempted to reach the pt at both #'s but was unable to reach him or leave a vm.

## 2019-04-06 NOTE — Telephone Encounter (Signed)
Pt is needing to speak to RN about a confusion at the pharmacy on his pregabalin (LYRICA) 50 MG capsule Please advise.

## 2019-04-06 NOTE — Telephone Encounter (Signed)
[facility] called [facility] and was informed his pregabalin is ready for pick up.  I returned the call to the patient. He is aware of that pregabalin 50mg  is ready at the pharmacy. He last heard it required a PA but this has been completed and approved now.

## 2019-04-06 NOTE — Telephone Encounter (Signed)
PA approved through [employer]. 03/26/19-03/24/20.

## 2019-04-15 ENCOUNTER — Encounter: Payer: Self-pay | Admitting: Family Medicine

## 2019-04-15 ENCOUNTER — Other Ambulatory Visit: Payer: Self-pay

## 2019-04-15 ENCOUNTER — Ambulatory Visit: Payer: Medicare HMO | Admitting: Family Medicine

## 2019-04-15 DIAGNOSIS — M1711 Unilateral primary osteoarthritis, right knee: Secondary | ICD-10-CM

## 2019-04-15 NOTE — Assessment & Plan Note (Signed)
Patient given another injection today.  Hopefully this will be beneficial.  Discussed icing regimen and home exercises, patient will increase activity slowly.  Discussed wearing the custom brace on a more regular basis when he is walking we discussed lower impact exercises such as biking that could be beneficial.  Follow-up again in 8 weeks

## 2019-04-15 NOTE — Patient Instructions (Addendum)
Good to see you Wear brace with walking Consider late Christmas present of a bike See me again in 8 weeks

## 2019-04-15 NOTE — Progress Notes (Signed)
[facility] [address] [place] Phone: [phone] Subjective:   I Daniel Everett am serving as a Education administrator for Dr. Hulan Saas.  This visit occurred during the SARS-CoV-2 public health emergency.  Safety protocols were in place, including screening questions prior to the visit, additional usage of staff PPE, and extensive cleaning of exam room while observing appropriate contact time as indicated for disinfecting solutions.   I'm seeing this patient by the request  of:  [name], MD  CC: Right knee pain follow-up  [id number]   03/04/2019 Viscosupplementation given prescription for right patient responded well hopefully to this morning.  Update 04/15/2019 Daniel Everett is a 86 y.o. male coming in with complaint of right knee pain. States the knee is not doing well. Last injection helped for a while but has worn off.  Patient states still increasing instability.  Patient states that unfortunately of walking seems to make him more aggravated.  Has not been quite as active.  Patient does not have any recent falls though.  Possible intermittent swelling he states     Past Medical History:  Diagnosis Date  . Allergic rhinitis due to pollen 06/07/2008  . Atrial fibrillation ([place])   . Benign essential HTN   . COLONIC POLYPS, HX OF 10/28/2006  . DIVERTICULOSIS, COLON 11/27/2006  . Gait abnormality 03/02/2018  . GEN OSTEOARTHROSIS INVOLVING MULTIPLE SITES 01/03/2010  . HYPERGLYCEMIA 10/18/2008  . HYPOTHYROIDISM 11/27/2006  . NEOPLASM, MALIGNANT, PROSTATE, HX OF, S/P TURP 05/28/2007  . OSTEOARTHRITIS 11/27/2006  . Peripheral neuropathy 04/08/2018  . Presence of permanent cardiac pacemaker 08/20/2018  . WEIGHT LOSS, RECENT 11/27/2006   Past Surgical History:  Procedure Laterality Date  . arthroscopy rt knee    . EXCISION MASS NECK Left 01/29/2019   Procedure: 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;  Surgeon: [name], MD;  Location: [facility];  Service: ENT;  Laterality: Left;  . HYDROCELE EXCISION / REPAIR    . INSERT / REPLACE / REMOVE PACEMAKER  08/20/2018  . PACEMAKER IMPLANT N/A 08/20/2018   Procedure: PACEMAKER IMPLANT;  Surgeon: Constance Haw, MD;  Location: [facility];  Service: Cardiovascular;  Laterality: N/A;  . PROSTATE SURGERY     TURP  . ROTATOR CUFF REPAIR     Social History   Socioeconomic History  . Marital status: Widowed    Spouse name: Not on file  . Number of children: 3  . Years of education: 4 years college  . Highest education level: Bachelor's degree (e.g., BA, AB, BS)  Occupational History  . Occupation: retired  Tobacco Use  . Smoking status: Never Smoker  . Smokeless tobacco: Never Used  Substance and Sexual Activity  . Alcohol use: No  . Drug use: No  . Sexual activity: Not Currently  Other Topics Concern  . Not on file  Social History Narrative   Lives alone   Caffeine use: very little   Right handed       3 children    2 in [place] and one in near [facility] graduate and played basketball there   Graduated in 59    Social Determinants of Health   Financial Resource Strain:   . Difficulty of Paying Living Expenses: Not on file  Food Insecurity: No Food Insecurity  . Worried About Charity fundraiser in the Last Year: Never true  . Ran Out of Food in the  Last Year: Never true  Transportation Needs: No Transportation Needs  . Lack of Transportation (Medical): No  . Lack of Transportation (Non-Medical): No  Physical Activity:   . Days of Exercise per Week: Not on file  . Minutes of Exercise per Session: Not on file  Stress:   . Feeling of Stress : Not on file  Social Connections: Unknown  . Frequency of Communication with Friends and Family: More than three times a week  . Frequency of Social Gatherings with Friends and Family: Not on file  . Attends Religious Services: Not on file  . Active Member of Clubs or  Organizations: Not on file  . Attends Archivist Meetings: Not on file  . Marital Status: Widowed   No Known Allergies Family History  Problem Relation Age of Onset  . Coronary artery disease Mother   . Heart disease Mother   . Pancreatic cancer Father   . Cancer Father   . Tremor Sister     Current Outpatient Medications (Endocrine & Metabolic):  .  levothyroxine (SYNTHROID) 75 MCG tablet, TAKE 1 TABLET DAILY (Patient taking differently: Take 75 mcg by mouth daily before breakfast. )  Current Outpatient Medications (Cardiovascular):  .  amLODipine (NORVASC) 5 MG tablet, Take 1 tablet (5 mg total) by mouth daily. Marland Kitchen  atorvastatin (LIPITOR) 20 MG tablet, TAKE 1 TABLET DAILY (Patient taking differently: Take 20 mg by mouth daily. ) .  lisinopril (ZESTRIL) 20 MG tablet, TAKE 1 TABLET DAILY (Patient taking differently: Take 20 mg by mouth daily. ) .  hydrochlorothiazide (MICROZIDE) 12.5 MG capsule, Take 1 capsule (12.5 mg total) by mouth daily. (Patient taking differently: Take 12.5 mg by mouth every evening. )   Current Outpatient Medications (Analgesics):  .  acetaminophen (TYLENOL) 650 MG CR tablet, Take 650 mg by mouth 2 (two) times a day. Marland Kitchen  HYDROcodone-acetaminophen (NORCO) 7.5-325 MG tablet, Take 1 tablet by mouth every 6 (six) hours as needed for moderate pain.  Current Outpatient Medications (Hematological):  .  apixaban (ELIQUIS) 5 MG TABS tablet, Take 1 tablet (5 mg total) by mouth 2 (two) times daily.  Current Outpatient Medications (Other):  .  cephALEXin (KEFLEX) 500 MG capsule, Take 1 capsule (500 mg total) by mouth 3 (three) times daily. .  Cholecalciferol (VITAMIN D3) 50 MCG (2000 UT) TABS, Take 2,000 Units by mouth daily. .  diclofenac Sodium (VOLTAREN) 1 % GEL, Apply topically. .  finasteride (PROSCAR) 5 MG tablet, TAKE 1 TABLET DAILY .  finasteride (PROSCAR) 5 MG tablet, Take 1 tablet (5 mg total) by mouth daily. Marland Kitchen  latanoprost (XALATAN) 0.005 %  ophthalmic solution, Place 1 drop into both eyes at bedtime.  .  Multiple Vitamin (MULTIVITAMIN WITH MINERALS) TABS tablet, Take 1 tablet by mouth daily. Senior Plus Multivitamin .  omeprazole (PRILOSEC) 20 MG capsule, TAKE 1 CAPSULE BY MOUTH EVERY DAY (Patient taking differently: Take 20 mg by mouth daily before supper. ) .  pregabalin (LYRICA) 50 MG capsule, 1 capsule daily for 1 week, then take 1 twice daily .  pyridOXINE (VITAMIN B-6) 100 MG tablet, Take 100 mg by mouth daily. .  timolol (TIMOPTIC) 0.5 % ophthalmic solution, Place 1 drop into both eyes 2 (two) times daily.   Marland Kitchen  topiramate (TOPAMAX) 25 MG tablet, Take 2 tablets (50 mg total) by mouth 2 (two) times daily.    Past medical history, social, surgical and family history all reviewed in electronic medical record.  No pertanent information unless stated regarding  to the chief complaint.   Review of Systems:  No headache, visual changes, nausea, vomiting, diarrhea, constipation, dizziness, abdominal pain, skin rash, fevers, chills, night sweats, weight loss, swollen lymph nodes, body aches,chest pain, shortness of breath, mood changes. POSITIVE muscle aches, joint swelling  Objective  Blood pressure 120/80, pulse 75, height 6\' 4"  (1.93 m), weight 204 lb (92.5 kg), SpO2 90 %.   General: No apparent distress alert and oriented x3 mood and affect normal, dressed appropriately.  HEENT: Pupils equal, extraocular movements intact  Respiratory: Patient's speak in full sentences and does not appear short of breath  Cardiovascular: Trace lower extremity edema, non tender, no erythema  Skin: Warm dry intact with no signs of infection or rash on extremities or on axial skeleton.  Abdomen: Soft nontender  Neuro: Cranial nerves II through XII are intact, neurovascularly intact in all extremities with 2+ DTRs and 2+ pulses.  Lymph: No lymphadenopathy of posterior or anterior cervical chain or axillae bilaterally.  Gait antalgic MSK: Arthritic  changes of multiple joints Knee: Right valgus deformity noted.  Abnormal thigh to calf ratio.  Tender to palpation over medial and PF joint line.  ROM full in flexion and extension and lower leg rotation. instability with valgus force.  painful patellar compression. Patellar glide with moderate crepitus. Patellar and quadriceps tendons unremarkable. Hamstring and quadriceps strength is normal.   After informed written and verbal consent, patient was seated on exam table. Right knee was prepped with alcohol swab and utilizing anterolateral approach, patient's right knee space was injected with 4:1  marcaine 0.5%: Kenalog 40mg /dL. Patient tolerated the procedure well without immediate complications.   Impression and Recommendations:     This case required medical decision making of moderate complexity. The above documentation has been reviewed and is accurate and complete Lyndal Pulley, DO       Note: This dictation was prepared with Dragon dictation along with smaller phrase technology. Any transcriptional errors that result from this process are unintentional.

## 2019-04-27 ENCOUNTER — Other Ambulatory Visit: Payer: Self-pay | Admitting: Cardiology

## 2019-04-30 ENCOUNTER — Other Ambulatory Visit: Payer: Self-pay | Admitting: Family Medicine

## 2019-05-03 ENCOUNTER — Other Ambulatory Visit: Payer: Self-pay

## 2019-05-03 ENCOUNTER — Ambulatory Visit: Payer: Medicare HMO | Admitting: Family Medicine

## 2019-05-04 ENCOUNTER — Other Ambulatory Visit: Payer: Self-pay

## 2019-05-04 ENCOUNTER — Encounter: Payer: Self-pay | Admitting: Family Medicine

## 2019-05-04 ENCOUNTER — Ambulatory Visit (INDEPENDENT_AMBULATORY_CARE_PROVIDER_SITE_OTHER): Payer: Medicare HMO | Admitting: Family Medicine

## 2019-05-04 VITALS — BP 104/68 | HR 61 | Temp 97.1°F | Ht 76.0 in | Wt 202.4 lb

## 2019-05-04 DIAGNOSIS — E038 Other specified hypothyroidism: Secondary | ICD-10-CM

## 2019-05-04 DIAGNOSIS — I4891 Unspecified atrial fibrillation: Secondary | ICD-10-CM

## 2019-05-04 DIAGNOSIS — I10 Essential (primary) hypertension: Secondary | ICD-10-CM | POA: Diagnosis not present

## 2019-05-04 DIAGNOSIS — E7800 Pure hypercholesterolemia, unspecified: Secondary | ICD-10-CM | POA: Diagnosis not present

## 2019-05-04 NOTE — Progress Notes (Signed)
Subjective:     Patient ID: Daniel Everett, male   DOB: 23-Nov-1931, 86 y.o.   MRN: [id number]  HPI Mr. Accomando is here for routine medical follow-up.  He has history of atrial fibrillation, hypertension, GERD, hypothyroidism, essential tremor, peripheral neuropathy, osteoarthritis of the knees, history of melanoma of the neck.  He had melanoma excision during the past years had close follow-up from that.  Fortunately no evidence for metastases.  He has hypertension which is been stable.  No recent orthostatic symptoms.  He has hypothyroidism and had labs in August and TSH was at goal.  His lipids were checked been stable.  He denies any recent falls but he does state that he feels frequently unsteady and off balance.  He has had physical therapy in the past.  He does not use anything for gait assistance at this time.  He has been much more sedentary during the past couple years.    Past Medical History:  Diagnosis Date  . Allergic rhinitis due to pollen 06/07/2008  . Atrial fibrillation ([place])   . Benign essential HTN   . COLONIC POLYPS, HX OF 10/28/2006  . DIVERTICULOSIS, COLON 11/27/2006  . Gait abnormality 03/02/2018  . GEN OSTEOARTHROSIS INVOLVING MULTIPLE SITES 01/03/2010  . HYPERGLYCEMIA 10/18/2008  . HYPOTHYROIDISM 11/27/2006  . NEOPLASM, MALIGNANT, PROSTATE, HX OF, S/P TURP 05/28/2007  . OSTEOARTHRITIS 11/27/2006  . Peripheral neuropathy 04/08/2018  . Presence of permanent cardiac pacemaker 08/20/2018  . WEIGHT LOSS, RECENT 11/27/2006   Past Surgical History:  Procedure Laterality Date  . arthroscopy rt knee    . EXCISION MASS NECK Left 01/29/2019   Procedure: 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;  Surgeon: [name], MD;  Location: [facility];  Service: ENT;  Laterality: Left;  . HYDROCELE EXCISION / REPAIR    . INSERT / REPLACE / REMOVE PACEMAKER  08/20/2018  . PACEMAKER IMPLANT N/A 08/20/2018   Procedure: PACEMAKER IMPLANT;  Surgeon: Constance Haw, MD;  Location: [facility];  Service: Cardiovascular;  Laterality: N/A;  . PROSTATE SURGERY     TURP  . ROTATOR CUFF REPAIR      reports that he has never smoked. He has never used smokeless tobacco. He reports that he does not drink alcohol or use drugs. family history includes Cancer in his father; Coronary artery disease in his mother; Heart disease in his mother; Pancreatic cancer in his father; Tremor in his sister. No Known Allergies   Review of Systems  Constitutional: Negative for appetite change, fatigue and unexpected weight change.  Eyes: Negative for visual disturbance.  Respiratory: Negative for cough, chest tightness and shortness of breath.   Cardiovascular: Negative for chest pain, palpitations and leg swelling.  Endocrine: Negative for polydipsia and polyuria.  Neurological: Negative for dizziness, syncope, weakness, light-headedness and headaches.       Objective:   Physical Exam Vitals reviewed.  Constitutional:      Appearance: Normal appearance.  Cardiovascular:     Rate and Rhythm: Normal rate and regular rhythm.  Pulmonary:     Effort: Pulmonary effort is normal.     Breath sounds: Normal breath sounds.  Musculoskeletal:     Cervical back: Neck supple.     Right lower leg: No edema.     Left lower leg: No edema.  Lymphadenopathy:     Cervical: No cervical adenopathy.  Neurological:     Mental Status: He is alert.        Assessment:     #  1 hypertension stable and at goal  #2 hypothyroidism.  He is on replacement.  Labs are stable last August and these were reviewed with patient  #3 hyperlipidemia stable by recent labs back in August.  #4 history of atrial fibrillation.  Currently he has regular rhythm.  Remains on Eliquis  #5 moderate risk for fall    Plan:     -Discussed fall prevention.  We discussed possible physical therapy referral for balance program but he declines at this time.  He has been given exercises to do at home in the past  -No  labs due at this time.  Will plan on routine 17-month follow-up and get lab work then  -Discussed Covid vaccine and he has a scheduled first 1 this Friday.  [name] MD [facility]

## 2019-05-04 NOTE — Patient Instructions (Signed)

## 2019-05-11 DIAGNOSIS — B0052 Herpesviral keratitis: Secondary | ICD-10-CM | POA: Diagnosis not present

## 2019-05-14 DIAGNOSIS — B0052 Herpesviral keratitis: Secondary | ICD-10-CM | POA: Diagnosis not present

## 2019-05-18 DIAGNOSIS — B0052 Herpesviral keratitis: Secondary | ICD-10-CM | POA: Diagnosis not present

## 2019-05-19 DIAGNOSIS — L905 Scar conditions and fibrosis of skin: Secondary | ICD-10-CM | POA: Diagnosis not present

## 2019-05-19 DIAGNOSIS — L570 Actinic keratosis: Secondary | ICD-10-CM | POA: Diagnosis not present

## 2019-05-19 DIAGNOSIS — D485 Neoplasm of uncertain behavior of skin: Secondary | ICD-10-CM | POA: Diagnosis not present

## 2019-05-19 DIAGNOSIS — L814 Other melanin hyperpigmentation: Secondary | ICD-10-CM | POA: Diagnosis not present

## 2019-05-19 DIAGNOSIS — Z85820 Personal history of malignant melanoma of skin: Secondary | ICD-10-CM | POA: Diagnosis not present

## 2019-05-19 DIAGNOSIS — L821 Other seborrheic keratosis: Secondary | ICD-10-CM | POA: Diagnosis not present

## 2019-05-19 DIAGNOSIS — D229 Melanocytic nevi, unspecified: Secondary | ICD-10-CM | POA: Diagnosis not present

## 2019-05-19 DIAGNOSIS — D1801 Hemangioma of skin and subcutaneous tissue: Secondary | ICD-10-CM | POA: Diagnosis not present

## 2019-05-24 ENCOUNTER — Ambulatory Visit (INDEPENDENT_AMBULATORY_CARE_PROVIDER_SITE_OTHER): Payer: Medicare HMO | Admitting: *Deleted

## 2019-05-24 DIAGNOSIS — I4821 Permanent atrial fibrillation: Secondary | ICD-10-CM | POA: Diagnosis not present

## 2019-05-24 LAB — CUP PACEART REMOTE DEVICE CHECK
Battery Remaining Longevity: 136 mo
Battery Remaining Percentage: 95.5 %
Battery Voltage: 3.01 V
Brady Statistic RV Percent Paced: 92 %
Date Time Interrogation Session: 20210301020015
Implantable Lead Implant Date: 20200528
Implantable Lead Location: 753860
Implantable Pulse Generator Implant Date: 20200528
Lead Channel Impedance Value: 490 Ohm
Lead Channel Pacing Threshold Amplitude: 0.625 V
Lead Channel Pacing Threshold Pulse Width: 0.5 ms
Lead Channel Sensing Intrinsic Amplitude: 4.2 mV
Lead Channel Setting Pacing Amplitude: 0.875
Lead Channel Setting Pacing Pulse Width: 0.5 ms
Lead Channel Setting Sensing Sensitivity: 2 mV
Pulse Gen Model: 1272
Pulse Gen Serial Number: 9116714

## 2019-05-25 NOTE — Progress Notes (Signed)
PPM Remote  

## 2019-05-26 DIAGNOSIS — B0052 Herpesviral keratitis: Secondary | ICD-10-CM | POA: Diagnosis not present

## 2019-05-26 DIAGNOSIS — H401111 Primary open-angle glaucoma, right eye, mild stage: Secondary | ICD-10-CM | POA: Diagnosis not present

## 2019-05-31 ENCOUNTER — Telehealth: Payer: Self-pay | Admitting: Neurology

## 2019-05-31 NOTE — Telephone Encounter (Signed)
I called pt about the lyrica medication.Pt stated he is now taking one tablet twice daily and its not helping his tremors. I stated message will be sent to work in [place]. Pt verbalized understanding.

## 2019-05-31 NOTE — Telephone Encounter (Signed)
Please call patient back and advise him that I would have to defer to Dr. Jannifer Franklin as far as further management of his tremors.  I would not favor increasing the medication Lyrica at this time.As long as he does not have any side effects from the Lyrica he can maintain the current dose.

## 2019-05-31 NOTE — Telephone Encounter (Signed)
pregabalin (LYRICA) 50 MG capsule   Pt callied in regards to his lyrica states he doesn't think his tremors are doing any better and if anything have gotten worse and would like to know if he can be prescribed an alternative or what he should do

## 2019-06-01 ENCOUNTER — Other Ambulatory Visit: Payer: Self-pay

## 2019-06-01 MED ORDER — PREGABALIN 50 MG PO CAPS: ORAL_CAPSULE | ORAL | 0 refills | Status: DC

## 2019-06-01 NOTE — Telephone Encounter (Signed)
I called pt that Dr Rexene Alberts the work in [place] does not want to increase his pregabalin  medication at this time.I stated to continue current dosage and message will be sent to Dr. Jannifer Franklin for next week for further management. PT verbalized understanding.

## 2019-06-03 NOTE — Telephone Encounter (Signed)
I called the patient.  He is on Lyrica taking 50 mg twice daily, he has no side effects on the drug.  He will try going up to 50 mg 3 times daily.  If this does not have any beneficial effect whatsoever, we could try low-dose clonazepam taking 0.5 mg in the morning only.  We have to watch out for drowsiness or gait instability on these medications.  I informed the patient that oral medications will only have a modest benefit on his tremor, a deep brain stimulator may eliminate the tremor but he likely is not a good candidate for this given his age.

## 2019-06-10 ENCOUNTER — Ambulatory Visit: Payer: Medicare HMO | Admitting: Family Medicine

## 2019-06-10 DIAGNOSIS — M1711 Unilateral primary osteoarthritis, right knee: Secondary | ICD-10-CM | POA: Diagnosis not present

## 2019-06-10 DIAGNOSIS — M25561 Pain in right knee: Secondary | ICD-10-CM | POA: Diagnosis not present

## 2019-06-17 MED ORDER — PREGABALIN 100 MG PO CAPS: 100.0000 mg | ORAL_CAPSULE | Freq: Two times a day (BID) | ORAL | 3 refills | Status: DC

## 2019-06-17 NOTE — Telephone Encounter (Signed)
:  Patient stated Dr. Jannifer Franklin increase his lyrica to three times a day on 06/03/2019 but he feels like it not really being effective. Pt is not having any side effects with taking the increase dosage. I stated message will be sent to Dr .Jannifer Franklin and he can review on next week. The pt verbalized understanding.

## 2019-06-17 NOTE — Telephone Encounter (Signed)
Pt called back in regards to missed call  

## 2019-06-17 NOTE — Telephone Encounter (Signed)
Pt called wanting to speak to RN or provider about some concerns he has with the increase and the refill on the pregabalin (LYRICA) 50 MG capsule Please advise.

## 2019-06-17 NOTE — Telephone Encounter (Signed)
Left vm for patient to call back about lyrica concerns.

## 2019-06-17 NOTE — Addendum Note (Signed)
Addended by: Kathrynn Ducking on: 06/17/2019 05:03 PM   Modules accepted: Orders

## 2019-06-17 NOTE — Telephone Encounter (Signed)
I called the patient.  He currently is on Lyrica taking 50 mg 3 times daily, he is tolerating the drug but it is not helpful for the tremor, we will go up to 100 mg twice daily.

## 2019-07-06 ENCOUNTER — Encounter: Payer: Self-pay | Admitting: Neurology

## 2019-07-06 ENCOUNTER — Other Ambulatory Visit: Payer: Self-pay

## 2019-07-06 ENCOUNTER — Ambulatory Visit: Payer: Medicare HMO | Admitting: Neurology

## 2019-07-06 VITALS — BP 120/83 | HR 64 | Temp 97.6°F | Ht 76.0 in | Wt 202.2 lb

## 2019-07-06 DIAGNOSIS — G250 Essential tremor: Secondary | ICD-10-CM

## 2019-07-06 DIAGNOSIS — G603 Idiopathic progressive neuropathy: Secondary | ICD-10-CM | POA: Diagnosis not present

## 2019-07-06 NOTE — Progress Notes (Signed)
PATIENT: Daniel Everett DOB: May 25, 1931  REASON FOR VISIT: follow up HISTORY FROM: patient  HISTORY OF PRESENT ILLNESS: Today 07/06/19  Daniel Everett is an 86 year old male with history of essential tremor affecting both upper extremities.  At last visit he complained of transient numbness of his left upper chest, left neck up to his ear, and trouble swallowing.  Carotid Doppler was unremarkable, swallowing evaluation did not show cause of food getting stuck.  The area of numbness around his neck is around the surgical site, could be related to a sensory nerve involvement.  For tremor, he has tried gabapentin, Topamax, and is now on Lyrica.  He cannot take primidone because he is on Eliquis.  He indicates swallowing is no longer an issue, doesn't remember this being problematic.  He is on Lyrica 100 mg twice a day, tolerating well, but is expensive.  He has not noticed any change in the tremor.  He has difficulty with fine motor skills, including handwriting.  He does well eating.  He lives alone, denies any falls.  He presents today for evaluation unaccompanied.  HISTORY 01/05/2019 Dr. Jannifer Franklin: Daniel Everett is an 86 year old right-handed white male with a history of essential tremors affecting both upper extremities.  He has difficulty performing tasks that require fine motor control and with handwriting.  The patient was given a brief trial on very low-dose gabapentin taking 200 mg twice daily, he did not get benefit with this and was switched off to very low-dose Topamax taking 25 mg at night.  He did not get any benefit with this low-dose, he stopped the medication 2 weeks ago.  He cannot take Mysoline as he is on Eliquis as a blood thinner.  He has been having episodes almost daily with transient numbness of the left upper chest and left neck up to the ear.  This will come and go without associated chest pain or pressure, arm numbness, or shortness of breath.  There is no pain associated with this  event.  He is also reporting episodes of solid food getting stuck in the throat, he is concerned about this and wishes to have further evaluation.  He has a peripheral neuropathy, he does have some gait instability, he reports no recent falls, he did have a fall in January 2020.  He had a pacemaker placed in May 2020.  He returns for an evaluation.  REVIEW OF SYSTEMS: Out of a complete 14 system review of symptoms, the patient complains only of the following symptoms, and all other reviewed systems are negative.  Tremor  ALLERGIES: No Known Allergies  HOME MEDICATIONS: Outpatient Medications Prior to Visit  Medication Sig Dispense Refill  . acetaminophen (TYLENOL) 650 MG CR tablet Take 650 mg by mouth 2 (two) times a day.    Marland Kitchen amLODipine (NORVASC) 5 MG tablet Take 1 tablet (5 mg total) by mouth daily. 90 tablet 3  . apixaban (ELIQUIS) 5 MG TABS tablet Take 1 tablet (5 mg total) by mouth 2 (two) times daily. 180 tablet 3  . atorvastatin (LIPITOR) 20 MG tablet TAKE 1 TABLET DAILY (Patient taking differently: Take 20 mg by mouth daily. ) 90 tablet 3  . Cholecalciferol (VITAMIN D3) 50 MCG (2000 UT) TABS Take 2,000 Units by mouth daily.    . diclofenac Sodium (VOLTAREN) 1 % GEL Apply topically.    . finasteride (PROSCAR) 5 MG tablet Take 1 tablet (5 mg total) by mouth daily. 90 tablet 0  . finasteride (PROSCAR) 5 MG  tablet TAKE 1 TABLET BY MOUTH EVERY DAY 90 tablet 0  . HYDROcodone-acetaminophen (NORCO) 7.5-325 MG tablet Take 1 tablet by mouth every 6 (six) hours as needed for moderate pain. 20 tablet 0  . levothyroxine (SYNTHROID) 75 MCG tablet TAKE 1 TABLET DAILY (Patient taking differently: Take 75 mcg by mouth daily before breakfast. ) 90 tablet 2  . lisinopril (ZESTRIL) 20 MG tablet TAKE 1 TABLET DAILY (Patient taking differently: Take 20 mg by mouth daily. ) 90 tablet 2  . Multiple Vitamin (MULTIVITAMIN WITH MINERALS) TABS tablet Take 1 tablet by mouth daily. Senior Plus Multivitamin    .  omeprazole (PRILOSEC) 20 MG capsule TAKE 1 CAPSULE DAILY 90 capsule 1  . pregabalin (LYRICA) 100 MG capsule Take 1 capsule (100 mg total) by mouth 2 (two) times daily. 60 capsule 3  . pyridOXINE (VITAMIN B-6) 100 MG tablet Take 100 mg by mouth daily.    . timolol (TIMOPTIC) 0.5 % ophthalmic solution Place 1 drop into the left eye 2 (two) times daily.     . hydrochlorothiazide (MICROZIDE) 12.5 MG capsule Take 1 capsule (12.5 mg total) by mouth daily. (Patient taking differently: Take 12.5 mg by mouth every evening. ) 90 capsule 1  . latanoprost (XALATAN) 0.005 % ophthalmic solution Place 1 drop into both eyes at bedtime.     . topiramate (TOPAMAX) 25 MG tablet Take 2 tablets (50 mg total) by mouth 2 (two) times daily. (Patient not taking: Reported on 07/06/2019) 360 tablet 0   No facility-administered medications prior to visit.    PAST MEDICAL HISTORY: Past Medical History:  Diagnosis Date  . Allergic rhinitis due to pollen 06/07/2008  . Atrial fibrillation ([place])   . Benign essential HTN   . COLONIC POLYPS, HX OF 10/28/2006  . DIVERTICULOSIS, COLON 11/27/2006  . Gait abnormality 03/02/2018  . GEN OSTEOARTHROSIS INVOLVING MULTIPLE SITES 01/03/2010  . HYPERGLYCEMIA 10/18/2008  . HYPOTHYROIDISM 11/27/2006  . NEOPLASM, MALIGNANT, PROSTATE, HX OF, S/P TURP 05/28/2007  . OSTEOARTHRITIS 11/27/2006  . Peripheral neuropathy 04/08/2018  . Presence of permanent cardiac pacemaker 08/20/2018  . WEIGHT LOSS, RECENT 11/27/2006    PAST SURGICAL HISTORY: Past Surgical History:  Procedure Laterality Date  . arthroscopy rt knee    . EXCISION MASS NECK Left 01/29/2019   Procedure: 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;  Surgeon: [name], MD;  Location: [facility];  Service: ENT;  Laterality: Left;  . HYDROCELE EXCISION / REPAIR    . INSERT / REPLACE / REMOVE PACEMAKER  08/20/2018  . PACEMAKER IMPLANT N/A 08/20/2018   Procedure: PACEMAKER IMPLANT;  Surgeon: Constance Haw, MD;  Location: [facility];  Service: Cardiovascular;  Laterality: N/A;  . PROSTATE SURGERY     TURP  . ROTATOR CUFF REPAIR      FAMILY HISTORY: Family History  Problem Relation Age of Onset  . Coronary artery disease Mother   . Heart disease Mother   . Pancreatic cancer Father   . Cancer Father   . Tremor Sister     SOCIAL HISTORY: Social History   Socioeconomic History  . Marital status: Widowed    Spouse name: Not on file  . Number of children: 3  . Years of education: 4 years college  . Highest education level: Bachelor's degree (e.g., BA, AB, BS)  Occupational History  . Occupation: retired  Tobacco Use  . Smoking status: Never Smoker  . Smokeless tobacco: Never Used  Substance and Sexual Activity  .  Alcohol use: No  . Drug use: No  . Sexual activity: Not Currently  Other Topics Concern  . Not on file  Social History Narrative   Lives alone   Caffeine use: very little   Right handed       3 children    2 in [place] and one in near [facility] graduate and played basketball there   Graduated in 59    Social Determinants of Health   Financial Resource Strain:   . Difficulty of Paying Living Expenses:   Food Insecurity: No Food Insecurity  . Worried About Charity fundraiser in the Last Year: Never true  . Ran Out of Food in the Last Year: Never true  Transportation Needs: No Transportation Needs  . Lack of Transportation (Medical): No  . Lack of Transportation (Non-Medical): No  Physical Activity:   . Days of Exercise per Week:   . Minutes of Exercise per Session:   Stress:   . Feeling of Stress :   Social Connections: Unknown  . Frequency of Communication with Friends and Family: More than three times a week  . Frequency of Social Gatherings with Friends and Family: Not on file  . Attends Religious Services: Not on file  . Active Member of Clubs or Organizations: Not on file  . Attends Archivist Meetings: Not on file  . Marital  Status: Widowed  Intimate Partner Violence:   . Fear of Current or Ex-Partner:   . Emotionally Abused:   Marland Kitchen Physically Abused:   . Sexually Abused:    PHYSICAL EXAM  Vitals:   07/06/19 0839  BP: 120/83  Pulse: 64  Temp: 97.6 F (36.4 C)  Weight: 202 lb 3.2 oz (91.7 kg)  Height: 6\' 4"  (1.93 m)   Body mass index is 24.61 kg/m.  Generalized: Well developed, in no acute distress   Neurological examination  Mentation: Alert oriented to time, place, history taking. Follows all commands speech and language fluent Cranial nerve II-XII: Pupils were equal round reactive to light. Extraocular movements were full, visual field were full on confrontational test. Facial sensation and strength were normal. Head turning and shoulder shrug were normal and symmetric. Motor: Good strength in all extremities, no tremor noted with hands outstretched Sensory: Sensory testing is intact to soft touch on all 4 extremities. No evidence of extinction is noted.  Coordination: Cerebellar testing reveals good finger-nose-finger and heel-to-shin bilaterally.  Mild intention tremor with finger-nose-finger.  A handwriting sample reveals the tremor is translated in drawing and handwriting. Gait and station: Gait is somewhat wide-based, but steady. Tandem gait is slightly unsteady.  No assistive device. Reflexes: Deep tendon reflexes are symmetric but depressed bilaterally DIAGNOSTIC DATA (LABS, IMAGING, TESTING) - I reviewed patient records, labs, notes, testing and imaging myself where available.  Lab Results  Component Value Date   WBC 13.1 (H) 01/27/2019   HGB 15.4 01/27/2019   HCT 45.6 01/27/2019   MCV 97.0 01/27/2019   PLT 223 01/27/2019      Component Value Date/Time   NA 137 01/27/2019 0923   NA 139 04/04/2016 1413   K 4.0 01/27/2019 0923   CL 105 01/27/2019 0923   CO2 22 01/27/2019 0923   GLUCOSE 96 01/27/2019 0923   BUN 25 (H) 01/27/2019 0923   BUN 16 04/04/2016 1413   CREATININE 1.03  01/27/2019 0923   CALCIUM 9.3 01/27/2019 0923   PROT 6.5 10/30/2018 0853   PROT 6.2 04/08/2018 1503  ALBUMIN 4.1 10/30/2018 0853   AST 32 10/30/2018 0853   ALT 42 10/30/2018 0853   ALKPHOS 53 10/30/2018 0853   BILITOT 1.0 10/30/2018 0853   GFRNONAA >60 01/27/2019 0923   GFRAA >60 01/27/2019 0923   Lab Results  Component Value Date   CHOL 161 10/30/2018   HDL 38.00 (L) 10/30/2018   LDLCALC 101 (H) 10/30/2018   TRIG 108.0 10/30/2018   CHOLHDL 4 10/30/2018   Lab Results  Component Value Date   HGBA1C 6.0 10/18/2008   Lab Results  Component Value Date   VITAMINB12 458 03/02/2018   Lab Results  Component Value Date   TSH 1.59 10/30/2018      ASSESSMENT AND PLAN 86 y.o. year old male  has a past medical history of Allergic rhinitis due to pollen (06/07/2008), Atrial fibrillation ([place]), Benign essential HTN, COLONIC POLYPS, HX OF (10/28/2006), DIVERTICULOSIS, COLON (11/27/2006), Gait abnormality (03/02/2018), GEN OSTEOARTHROSIS INVOLVING MULTIPLE SITES (01/03/2010), HYPERGLYCEMIA (10/18/2008), HYPOTHYROIDISM (11/27/2006), NEOPLASM, MALIGNANT, PROSTATE, HX OF, S/P TURP (05/28/2007), OSTEOARTHRITIS (11/27/2006), Peripheral neuropathy (04/08/2018), Presence of permanent cardiac pacemaker (08/20/2018), and WEIGHT LOSS, RECENT (11/27/2006). here with:  1.  Peripheral neuropathy, mild gait instability 2.  Essential tremor -Has not seen much benefit with Lyrica 100 mg twice a day, will remain on the medication for 1 month (started 3/25), if still no benefit, will wean down to once daily, then stop, evaluate tremor at baseline, Lyrica is expensive for him   -If desired, may consider 0.5 mg clonazepam in the morning  -We discussed, oral medications will likely only have a modest benefit for his tremor, deep brain stimulator may eliminate the tremor, but he likely is not a good candidate given age  -Follow-up in 6 months or sooner if needed  3.  Reports of swallowing difficulty 4.  Episodic left  neck, facial, and shoulder numbness -Symptoms have essentially resolved -Carotid Doppler was unremarkable -Swallow evaluation did not show etiology for symptoms  I spent 30 minutes of face-to-face and non-face-to-face time with patient.  This included previsit chart review, lab review, study review, order entry, electronic health record documentation, patient education.   Butler Denmark, AGNP-C, DNP 07/06/2019, 9:24 AM [facility] [address], [place], [place] [address] [phone]

## 2019-07-06 NOTE — Progress Notes (Signed)
I have read the note, and I agree with the clinical assessment and plan.  Daniel Everett [name]   

## 2019-07-06 NOTE — Patient Instructions (Signed)
Finish up your supply of Lyrica, see how you do, if you feel like you want to try more medication let me know See you back in 6 months

## 2019-07-26 ENCOUNTER — Other Ambulatory Visit: Payer: Self-pay | Admitting: Cardiology

## 2019-08-07 ENCOUNTER — Other Ambulatory Visit: Payer: Self-pay | Admitting: Cardiology

## 2019-08-07 ENCOUNTER — Other Ambulatory Visit: Payer: Self-pay | Admitting: Family Medicine

## 2019-08-16 DIAGNOSIS — L570 Actinic keratosis: Secondary | ICD-10-CM | POA: Diagnosis not present

## 2019-08-16 DIAGNOSIS — L819 Disorder of pigmentation, unspecified: Secondary | ICD-10-CM | POA: Diagnosis not present

## 2019-08-16 DIAGNOSIS — Z85820 Personal history of malignant melanoma of skin: Secondary | ICD-10-CM | POA: Diagnosis not present

## 2019-08-16 DIAGNOSIS — Z85828 Personal history of other malignant neoplasm of skin: Secondary | ICD-10-CM | POA: Diagnosis not present

## 2019-08-16 DIAGNOSIS — L905 Scar conditions and fibrosis of skin: Secondary | ICD-10-CM | POA: Diagnosis not present

## 2019-08-25 ENCOUNTER — Ambulatory Visit (INDEPENDENT_AMBULATORY_CARE_PROVIDER_SITE_OTHER): Payer: Medicare HMO | Admitting: *Deleted

## 2019-08-25 DIAGNOSIS — M1990 Unspecified osteoarthritis, unspecified site: Secondary | ICD-10-CM | POA: Diagnosis not present

## 2019-08-25 DIAGNOSIS — D6869 Other thrombophilia: Secondary | ICD-10-CM | POA: Diagnosis not present

## 2019-08-25 DIAGNOSIS — I10 Essential (primary) hypertension: Secondary | ICD-10-CM | POA: Diagnosis not present

## 2019-08-25 DIAGNOSIS — H409 Unspecified glaucoma: Secondary | ICD-10-CM | POA: Diagnosis not present

## 2019-08-25 DIAGNOSIS — N400 Enlarged prostate without lower urinary tract symptoms: Secondary | ICD-10-CM | POA: Diagnosis not present

## 2019-08-25 DIAGNOSIS — I4891 Unspecified atrial fibrillation: Secondary | ICD-10-CM

## 2019-08-25 DIAGNOSIS — H5462 Unqualified visual loss, left eye, normal vision right eye: Secondary | ICD-10-CM | POA: Diagnosis not present

## 2019-08-25 DIAGNOSIS — E785 Hyperlipidemia, unspecified: Secondary | ICD-10-CM | POA: Diagnosis not present

## 2019-08-25 DIAGNOSIS — K219 Gastro-esophageal reflux disease without esophagitis: Secondary | ICD-10-CM | POA: Diagnosis not present

## 2019-08-25 DIAGNOSIS — E039 Hypothyroidism, unspecified: Secondary | ICD-10-CM | POA: Diagnosis not present

## 2019-08-25 LAB — CUP PACEART REMOTE DEVICE CHECK
Battery Remaining Longevity: 131 mo
Battery Remaining Percentage: 95.5 %
Battery Voltage: 3.01 V
Brady Statistic RV Percent Paced: 92 %
Date Time Interrogation Session: 20210531024018
Implantable Lead Implant Date: 20200528
Implantable Lead Location: 753860
Implantable Pulse Generator Implant Date: 20200528
Lead Channel Impedance Value: 550 Ohm
Lead Channel Pacing Threshold Amplitude: 0.5 V
Lead Channel Pacing Threshold Pulse Width: 0.5 ms
Lead Channel Sensing Intrinsic Amplitude: 3.2 mV
Lead Channel Setting Pacing Amplitude: 0.75 V
Lead Channel Setting Pacing Pulse Width: 0.5 ms
Lead Channel Setting Sensing Sensitivity: 2 mV
Pulse Gen Model: 1272
Pulse Gen Serial Number: 9116714

## 2019-08-31 NOTE — Progress Notes (Signed)
Remote pacemaker transmission.   

## 2019-09-22 ENCOUNTER — Telehealth: Payer: Self-pay | Admitting: Neurology

## 2019-09-22 NOTE — Telephone Encounter (Signed)
Pt would like a call to discuss him not being on any kind of medication for his tremors.  Pt would like to go back on something for his tremors

## 2019-09-23 NOTE — Addendum Note (Signed)
Addended by: Suzzanne Cloud on: 09/23/2019 12:34 PM   Modules accepted: Orders

## 2019-09-23 NOTE — Telephone Encounter (Signed)
I spoke to pt and power went down.  He is asking for something else for his tremor.  Please advise.

## 2019-09-23 NOTE — Telephone Encounter (Signed)
I called the patient. He has been off the Lyrica for 3 weeks. He noticed only very modest benefit with Lyrica now that he is off. The Lyrica was expensive for him.  We talked about clonazepam, based on Dr. Tobey Grim recommendation 0.5 mg the morning, we talked the potential side effect of balance and gait instability.  He is fearful of this, he lives alone.  We have decided to hold off on further medications.  We may restart Lyrica, in the subsequent weeks, if he realizes it was more helpful than he originally thought.

## 2019-09-28 ENCOUNTER — Other Ambulatory Visit: Payer: Self-pay

## 2019-09-28 ENCOUNTER — Inpatient Hospital Stay: Payer: Medicare HMO | Attending: Oncology

## 2019-09-28 DIAGNOSIS — C434 Malignant melanoma of scalp and neck: Secondary | ICD-10-CM

## 2019-09-28 DIAGNOSIS — R599 Enlarged lymph nodes, unspecified: Secondary | ICD-10-CM | POA: Insufficient documentation

## 2019-09-28 DIAGNOSIS — Z85820 Personal history of malignant melanoma of skin: Secondary | ICD-10-CM | POA: Diagnosis present

## 2019-09-28 LAB — CBC WITH DIFFERENTIAL (CANCER CENTER ONLY)
Abs Immature Granulocytes: 0.02 10*3/uL (ref 0.00–0.07)
Basophils Absolute: 0 10*3/uL (ref 0.0–0.1)
Basophils Relative: 1 %
Eosinophils Absolute: 0.1 10*3/uL (ref 0.0–0.5)
Eosinophils Relative: 1 %
HCT: 44.3 % (ref 39.0–52.0)
Hemoglobin: 14.9 g/dL (ref 13.0–17.0)
Immature Granulocytes: 0 %
Lymphocytes Relative: 32 %
Lymphs Abs: 1.8 10*3/uL (ref 0.7–4.0)
MCH: 32 pg (ref 26.0–34.0)
MCHC: 33.6 g/dL (ref 30.0–36.0)
MCV: 95.3 fL (ref 80.0–100.0)
Monocytes Absolute: 0.6 10*3/uL (ref 0.1–1.0)
Monocytes Relative: 11 %
Neutro Abs: 3 10*3/uL (ref 1.7–7.7)
Neutrophils Relative %: 55 %
Platelet Count: 186 10*3/uL (ref 150–400)
RBC: 4.65 MIL/uL (ref 4.22–5.81)
RDW: 13.4 % (ref 11.5–15.5)
WBC Count: 5.5 10*3/uL (ref 4.0–10.5)
nRBC: 0 % (ref 0.0–0.2)

## 2019-09-28 LAB — CMP (CANCER CENTER ONLY)
ALT: 19 U/L (ref 0–44)
AST: 28 U/L (ref 15–41)
Albumin: 4 g/dL (ref 3.5–5.0)
Alkaline Phosphatase: 52 U/L (ref 38–126)
Anion gap: 8 (ref 5–15)
BUN: 20 mg/dL (ref 8–23)
CO2: 27 mmol/L (ref 22–32)
Calcium: 9.5 mg/dL (ref 8.9–10.3)
Chloride: 102 mmol/L (ref 98–111)
Creatinine: 1.22 mg/dL (ref 0.61–1.24)
GFR, Est AFR Am: >60 mL/min (ref >60)
GFR, Estimated: 53 mL/min — ABNORMAL LOW (ref >60)
Glucose, Bld: 100 mg/dL — ABNORMAL HIGH (ref 70–99)
Potassium: 4.7 mmol/L (ref 3.5–5.1)
Sodium: 137 mmol/L (ref 135–145)
Total Bilirubin: 1.1 mg/dL (ref 0.3–1.2)
Total Protein: 7 g/dL (ref 6.5–8.1)

## 2019-10-05 ENCOUNTER — Other Ambulatory Visit: Payer: Self-pay

## 2019-10-05 ENCOUNTER — Inpatient Hospital Stay: Payer: Medicare HMO | Admitting: Oncology

## 2019-10-05 VITALS — BP 127/86 | HR 60 | Temp 97.6°F | Resp 18 | Ht 76.0 in | Wt 195.8 lb

## 2019-10-05 DIAGNOSIS — Z85820 Personal history of malignant melanoma of skin: Secondary | ICD-10-CM | POA: Diagnosis not present

## 2019-10-05 DIAGNOSIS — C434 Malignant melanoma of scalp and neck: Secondary | ICD-10-CM

## 2019-10-05 DIAGNOSIS — R599 Enlarged lymph nodes, unspecified: Secondary | ICD-10-CM | POA: Diagnosis not present

## 2019-10-05 NOTE — Progress Notes (Addendum)
Hematology and Oncology Follow Up Visit  Daniel Everett [id number] 1932/01/18 86 y.o. 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  He was found to have 1.2 mm without ulceration.   Prior Therapy:  He is status post wide excision and sentinel lymph node sampling completed in November 2020.  Current therapy: Active surveillance.   Interim History: Daniel Everett returns today for a follow-up visit.  Since last visit, he reports no major changes in his health.  He continues to live independently and attends to activities of daily living.  He denies any recent falls or syncope.  He denies any lymphadenopathy or constitutional symptoms.  His weight and performance status remains unchanged.    Medications: I have reviewed the patient's current medications.  Current Outpatient Medications  Medication Sig Dispense Refill  . acetaminophen (TYLENOL) 650 MG CR tablet Take 650 mg by mouth 2 (two) times a day.    Marland Kitchen amLODipine (NORVASC) 5 MG tablet TAKE 1 TABLET DAILY 90 tablet 3  . apixaban (ELIQUIS) 5 MG TABS tablet Take 1 tablet (5 mg total) by mouth 2 (two) times daily. 180 tablet 3  . atorvastatin (LIPITOR) 20 MG tablet TAKE 1 TABLET DAILY (Patient taking differently: Take 20 mg by mouth daily. ) 90 tablet 3  . Cholecalciferol (VITAMIN D3) 50 MCG (2000 UT) TABS Take 2,000 Units by mouth daily.    . diclofenac Sodium (VOLTAREN) 1 % GEL Apply topically.    . finasteride (PROSCAR) 5 MG tablet TAKE 1 TABLET BY MOUTH EVERY DAY 90 tablet 0  . finasteride (PROSCAR) 5 MG tablet TAKE 1 TABLET DAILY 90 tablet 0  . hydrochlorothiazide (MICROZIDE) 12.5 MG capsule Take 1 capsule (12.5 mg total) by mouth daily. 90 capsule 2  . HYDROcodone-acetaminophen (NORCO) 7.5-325 MG tablet Take 1 tablet by mouth every 6 (six) hours as needed for moderate pain. 20 tablet 0  . levothyroxine (SYNTHROID) 75 MCG  tablet TAKE 1 TABLET DAILY 90 tablet 0  . lisinopril (ZESTRIL) 20 MG tablet TAKE 1 TABLET DAILY 90 tablet 3  . Multiple Vitamin (MULTIVITAMIN WITH MINERALS) TABS tablet Take 1 tablet by mouth daily. Senior Plus Multivitamin    . omeprazole (PRILOSEC) 20 MG capsule TAKE 1 CAPSULE DAILY 90 capsule 1  . pyridOXINE (VITAMIN B-6) 100 MG tablet Take 100 mg by mouth daily.    . timolol (TIMOPTIC) 0.5 % ophthalmic solution Place 1 drop into the left eye 2 (two) times daily.      No current facility-administered medications for this visit.     Allergies: No Known Allergies    Physical Exam: Blood pressure 127/86, pulse 60, temperature 97.6 F (36.4 C), temperature source Temporal, resp. rate 18, height 6\' 4"  (1.93 m), weight 195 lb 12.8 oz (88.8 kg), SpO2 99 %.   ECOG:  1   General appearance: Comfortable appearing without any discomfort Head: Normocephalic without any trauma Oropharynx: Mucous membranes are moist and pink without any thrush or ulcers. Eyes: Pupils are equal and round reactive to light. Lymph nodes: No cervical, supraclavicular, inguinal or axillary lymphadenopathy.   Heart:regular rate and rhythm.  S1 and S2 without leg edema. Lung: Clear without any rhonchi or wheezes.  No dullness to percussion. Abdomin: Soft, nontender, nondistended with good bowel sounds.  No hepatosplenomegaly. Musculoskeletal: No joint deformity or effusion.  Full range of motion noted. Neurological: No deficits noted on motor, sensory and deep tendon  reflex exam. Skin: No petechial rash or dryness.  Appeared moist.      Lab Results: Lab Results  Component Value Date   WBC 5.5 09/28/2019   HGB 14.9 09/28/2019   HCT 44.3 09/28/2019   MCV 95.3 09/28/2019   PLT 186 09/28/2019     Chemistry      Component Value Date/Time   NA 137 09/28/2019 0948   NA 139 04/04/2016 1413   K 4.7 09/28/2019 0948   CL 102 09/28/2019 0948   CO2 27 09/28/2019 0948   BUN 20 09/28/2019 0948   BUN 16  04/04/2016 1413   CREATININE 1.22 09/28/2019 0948      Component Value Date/Time   CALCIUM 9.5 09/28/2019 0948   ALKPHOS 52 09/28/2019 0948   AST 28 09/28/2019 0948   ALT 19 09/28/2019 0948   BILITOT 1.1 09/28/2019 0948       Impression and Plan:   86 year old with:  1.  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  He status post wide excision with 0 out of 1 lymph node involvement noted.  He is currently on active surveillance without any evidence of recurrent disease.  PET CT scan in December 2020 did show borderline enlarged lymph node that is currently monitored.  His repeat imaging studies was scheduled for July 2021 and has not been completed.  Laboratory data on September 28, 2019 did not show any clear abnormalities.  Risks and benefits of obtaining imaging studies in the immediate future versus awaiting for 6 months were discussed.  His examination is normal without any enlarging lymphadenopathy in the left axilla we opted to repeat imaging studies in 6 months.  He is agreeable with this plan.   2.  Dermatology surveillance: He is currently up-to-date continues to receive under care of his dermatologist.  3.  Follow-up: In 6 months for repeat evaluation.  30  minutes were dedicated to this encounter.  Time was spent on reviewing his disease status, assessing risk of relapse as well as future plan of care review.    Zola Button, MD 7/13/202110:26 AM

## 2019-10-12 ENCOUNTER — Telehealth: Payer: Self-pay | Admitting: Oncology

## 2019-10-12 NOTE — Telephone Encounter (Signed)
Scheduled per los, patient has been called and notified. 

## 2019-10-19 ENCOUNTER — Other Ambulatory Visit: Payer: Self-pay | Admitting: Family Medicine

## 2019-10-19 ENCOUNTER — Other Ambulatory Visit: Payer: Self-pay | Admitting: Cardiology

## 2019-10-27 ENCOUNTER — Telehealth: Payer: Self-pay | Admitting: Neurology

## 2019-10-27 NOTE — Telephone Encounter (Signed)
Called and LMVM for Daniel Everett to come in tomorrow at 1530 to see Dr. Jannifer Franklin for worsening tremors.  I recall bing on phone with Daniel Everett and power went down for several days.  Im glad he returned call.  He is to call and confirm appt.

## 2019-10-27 NOTE — Telephone Encounter (Signed)
Pt called stating he was responding to a message he was left from Andrews to call him re: an appointment.  Pt was informed of his next appointment being for the month of Oct.  Pt state he was told he would hear from Dr Jannifer Franklin but never did, re: his tremors worsening.  Pt is asking to be called back.

## 2019-10-28 ENCOUNTER — Encounter: Payer: Self-pay | Admitting: Neurology

## 2019-10-28 ENCOUNTER — Ambulatory Visit: Payer: Medicare HMO | Admitting: Neurology

## 2019-10-28 VITALS — BP 109/68 | HR 60 | Ht 76.0 in | Wt 197.5 lb

## 2019-10-28 DIAGNOSIS — G250 Essential tremor: Secondary | ICD-10-CM

## 2019-10-28 MED ORDER — CLONAZEPAM 0.5 MG PO TABS: 0.5000 mg | ORAL_TABLET | Freq: Every day | ORAL | 2 refills | Status: DC

## 2019-10-28 NOTE — Progress Notes (Signed)
Reason for visit: Essential tremor  Daniel Everett is an 86 y.o. male  History of present illness:  Daniel Everett is an 86 year old right-handed white male with a history of essential tremor.  The patient mainly has difficulty with handwriting but he also has difficulty putting in his eyedrops at night.  He is able to feed himself fairly well.  In the last several months he has noted that he has had some problems with hoarseness, he denies any problems with swallowing at this point.  He has not had any significant gait instability or falls.  He has come off of Lyrica in part because he was not sure it was working and in part because it was too expensive.  Coming off of Lyrica has resulted in an increase in the tremor severity.  He comes in today for an evaluation.  Past Medical History:  Diagnosis Date  . Allergic rhinitis due to pollen 06/07/2008  . Atrial fibrillation ([place])   . Benign essential HTN   . COLONIC POLYPS, HX OF 10/28/2006  . DIVERTICULOSIS, COLON 11/27/2006  . Gait abnormality 03/02/2018  . GEN OSTEOARTHROSIS INVOLVING MULTIPLE SITES 01/03/2010  . HYPERGLYCEMIA 10/18/2008  . HYPOTHYROIDISM 11/27/2006  . NEOPLASM, MALIGNANT, PROSTATE, HX OF, S/P TURP 05/28/2007  . OSTEOARTHRITIS 11/27/2006  . Peripheral neuropathy 04/08/2018  . Presence of permanent cardiac pacemaker 08/20/2018  . WEIGHT LOSS, RECENT 11/27/2006    Past Surgical History:  Procedure Laterality Date  . arthroscopy rt knee    . EXCISION MASS NECK Left 01/29/2019   Procedure: 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;  Surgeon: [name], MD;  Location: [facility];  Service: ENT;  Laterality: Left;  . HYDROCELE EXCISION / REPAIR    . INSERT / REPLACE / REMOVE PACEMAKER  08/20/2018  . PACEMAKER IMPLANT N/A 08/20/2018   Procedure: PACEMAKER IMPLANT;  Surgeon: Constance Haw, MD;  Location: [facility];  Service: Cardiovascular;  Laterality: N/A;  . PROSTATE SURGERY     TURP  . ROTATOR CUFF  REPAIR      Family History  Problem Relation Age of Onset  . Coronary artery disease Mother   . Heart disease Mother   . Pancreatic cancer Father   . Cancer Father   . Tremor Sister     Social history:  reports that he has never smoked. He has never used smokeless tobacco. He reports that he does not drink alcohol and does not use drugs.   No Known Allergies  Medications:  Prior to Admission medications   Medication Sig Start Date End Date Taking? Authorizing Provider  acetaminophen (TYLENOL) 650 MG CR tablet Take 650 mg by mouth 2 (two) times a day.   Yes [provider]  amLODipine (NORVASC) 5 MG tablet TAKE 1 TABLET DAILY 08/10/19  Yes Martinique, Peter M, MD  apixaban (ELIQUIS) 5 MG TABS tablet Take 1 tablet (5 mg total) by mouth 2 (two) times daily. 05/15/17  Yes Martinique, Peter M, MD  atorvastatin (LIPITOR) 20 MG tablet TAKE 1 TABLET DAILY Patient taking differently: Take 20 mg by mouth daily.  01/05/19  Yes Martinique, Peter M, MD  Cholecalciferol (VITAMIN D3) 50 MCG (2000 UT) TABS Take 2,000 Units by mouth daily.   Yes [provider]  diclofenac Sodium (VOLTAREN) 1 % GEL Apply topically.   Yes [provider]  finasteride (PROSCAR) 5 MG tablet TAKE 1 TABLET BY MOUTH EVERY DAY 04/30/19  Yes Burchette, Alinda Sierras, MD  finasteride (PROSCAR) 5 MG tablet TAKE 1 TABLET DAILY 10/20/19  Yes Burchette, Alinda Sierras, MD  hydrochlorothiazide (MICROZIDE) 12.5 MG capsule Take 1 capsule (12.5 mg total) by mouth daily. 07/27/19  Yes Martinique, Peter M, MD  HYDROcodone-acetaminophen (NORCO) 7.5-325 MG tablet Take 1 tablet by mouth every 6 (six) hours as needed for moderate pain. 01/29/19  Yes [name], MD  levothyroxine (SYNTHROID) 75 MCG tablet TAKE 1 TABLET DAILY 08/09/19  Yes Burchette, Alinda Sierras, MD  lisinopril (ZESTRIL) 20 MG tablet TAKE 1 TABLET DAILY 08/10/19  Yes Martinique, Peter M, MD  Multiple Vitamin (MULTIVITAMIN WITH MINERALS) TABS tablet Take 1 tablet by mouth daily. Senior Plus  Multivitamin   Yes [provider]  omeprazole (PRILOSEC) 20 MG capsule TAKE 1 CAPSULE DAILY 10/20/19  Yes Martinique, Peter M, MD  pyridOXINE (VITAMIN B-6) 100 MG tablet Take 100 mg by mouth daily.   Yes [provider]  timolol (TIMOPTIC) 0.5 % ophthalmic solution Place 1 drop into the left eye 2 (two) times daily.    Yes [provider]    ROS:  Out of a complete 14 system review of symptoms, the patient complains only of the following symptoms, and all other reviewed systems are negative.  Tremor Hoarseness  Blood pressure 109/68, pulse 60, height 6\' 4"  (1.93 m), weight 197 lb 8 oz (89.6 kg).  Physical Exam  General: The patient is alert and cooperative at the time of the examination.  Skin: No significant peripheral edema is noted.   Neurologic Exam  Mental status: The patient is alert and oriented x 3 at the time of the examination. The patient has apparent normal recent and remote memory, with an apparently normal attention span and concentration ability.   Cranial nerves: Facial symmetry is present. Speech is normal, no aphasia or dysarthria is noted. Extraocular movements are full. Visual fields are full.  Motor: The patient has good strength in all 4 extremities.  Sensory examination: Soft touch sensation is symmetric on the face, arms, and legs.  Coordination: The patient has good finger-nose-finger and heel-to-shin bilaterally.  Minimal tremor seen with finger-nose-finger, he has some tremor translated into handwriting however.  Gait and station: The patient has a normal gait. Tandem gait is normal. Romberg is negative. No drift is seen.  Reflexes: Deep tendon reflexes are symmetric.   Assessment/Plan:  1.  Essential tremor  The patient will be given a trial on low-dose clonazepam taking 0.5 mg in the morning.  We will call for any dose adjustments.  We will follow up here in 6 months.  Jill Alexanders MD 10/28/2019 3:19 PM  [facility] [address] [place], [place] [address]  Phone [phone] Fax [phone]

## 2019-10-28 NOTE — Telephone Encounter (Signed)
I called and confirmed appt with Dr. Jannifer Franklin to day at 1530.

## 2019-11-01 ENCOUNTER — Ambulatory Visit (INDEPENDENT_AMBULATORY_CARE_PROVIDER_SITE_OTHER): Payer: Medicare HMO | Admitting: Family Medicine

## 2019-11-01 ENCOUNTER — Ambulatory Visit: Payer: Medicare HMO | Admitting: Family Medicine

## 2019-11-01 ENCOUNTER — Other Ambulatory Visit: Payer: Self-pay

## 2019-11-01 ENCOUNTER — Encounter: Payer: Self-pay | Admitting: Family Medicine

## 2019-11-01 VITALS — BP 122/74 | HR 67 | Temp 98.1°F | Wt 198.4 lb

## 2019-11-01 DIAGNOSIS — Z800 Family history of malignant neoplasm of digestive organs: Secondary | ICD-10-CM

## 2019-11-01 DIAGNOSIS — I4891 Unspecified atrial fibrillation: Secondary | ICD-10-CM | POA: Diagnosis not present

## 2019-11-01 DIAGNOSIS — E038 Other specified hypothyroidism: Secondary | ICD-10-CM | POA: Diagnosis not present

## 2019-11-01 DIAGNOSIS — I10 Essential (primary) hypertension: Secondary | ICD-10-CM

## 2019-11-01 DIAGNOSIS — E7800 Pure hypercholesterolemia, unspecified: Secondary | ICD-10-CM | POA: Diagnosis not present

## 2019-11-01 DIAGNOSIS — K219 Gastro-esophageal reflux disease without esophagitis: Secondary | ICD-10-CM | POA: Diagnosis not present

## 2019-11-01 DIAGNOSIS — R490 Dysphonia: Secondary | ICD-10-CM

## 2019-11-01 NOTE — Patient Instructions (Signed)
I will set up GI referral  Consider Shingrix vaccine and check with insurance for coverage if interested  Consider trial of over the counter Prilosec 20 mg once daily for the hoarseness.    Confirm if [facility] lipid levels with recent labs.

## 2019-11-01 NOTE — Progress Notes (Signed)
Established Patient Office Visit  Subjective:  Patient ID: Daniel Everett, male    DOB: December 06, 1931  Age: 86 y.o. MRN: [id number]  CC:  Chief Complaint  Patient presents with  . Follow-up    pt is here for 6 month follow up no other concerns     HPI Daniel Everett presents for medical follow-up. He has history of atrial fibrillation, hypertension, GERD, hypothyroidism, essential tremor, peripheral neuropathy, osteoarthritis, history of prostate cancer, and recently diagnosed melanoma left side of neck. He states surgery for that went well.  His tremor is relatively stable. He is followed by neurology for that. Currently takes low-dose clonazepam. Denies recent falls. Ongoing balance issues. He has had a therapy in the past for that  He is followed at the [facility] now and states he is getting labs there about twice a year. He thinks he had some recent labs but does not have been present with him today  Chronic hoarseness. We had referred to ENT for nasolaryngoscopy. No cancer found. He has taken omeprazole in the past but not clear if he is taking this regularly. No obvious GERD symptoms. He does have frequent burping.  Patient relates sister was diagnosed recently with colon cancer at age 51. Is also had a brother with colon cancer. His last colonoscopy apparently was 2012. He has no bloody stools or recent anemia. He is inquiring about further colonoscopies.  Past Medical History:  Diagnosis Date  . Allergic rhinitis due to pollen 06/07/2008  . Atrial fibrillation ([place])   . Benign essential HTN   . COLONIC POLYPS, HX OF 10/28/2006  . DIVERTICULOSIS, COLON 11/27/2006  . Gait abnormality 03/02/2018  . GEN OSTEOARTHROSIS INVOLVING MULTIPLE SITES 01/03/2010  . HYPERGLYCEMIA 10/18/2008  . HYPOTHYROIDISM 11/27/2006  . NEOPLASM, MALIGNANT, PROSTATE, HX OF, S/P TURP 05/28/2007  . OSTEOARTHRITIS 11/27/2006  . Peripheral neuropathy 04/08/2018  . Presence of permanent cardiac pacemaker 08/20/2018  .  WEIGHT LOSS, RECENT 11/27/2006    Past Surgical History:  Procedure Laterality Date  . arthroscopy rt knee    . EXCISION MASS NECK Left 01/29/2019   Procedure: 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;  Surgeon: [name], MD;  Location: [facility];  Service: ENT;  Laterality: Left;  . HYDROCELE EXCISION / REPAIR    . INSERT / REPLACE / REMOVE PACEMAKER  08/20/2018  . PACEMAKER IMPLANT N/A 08/20/2018   Procedure: PACEMAKER IMPLANT;  Surgeon: Constance Haw, MD;  Location: [facility];  Service: Cardiovascular;  Laterality: N/A;  . PROSTATE SURGERY     TURP  . ROTATOR CUFF REPAIR      Family History  Problem Relation Age of Onset  . Coronary artery disease Mother   . Heart disease Mother   . Pancreatic cancer Father   . Cancer Father   . Tremor Sister     Social History   Socioeconomic History  . Marital status: Widowed    Spouse name: Not on file  . Number of children: 3  . Years of education: 4 years college  . Highest education level: Bachelor's degree (e.g., BA, AB, BS)  Occupational History  . Occupation: retired  Tobacco Use  . Smoking status: Never Smoker  . Smokeless tobacco: Never Used  Vaping Use  . Vaping Use: Never used  Substance and Sexual Activity  . Alcohol use: No  . Drug use: No  . Sexual activity: Not Currently  Other Topics Concern  . Not on file  Social History Narrative   Lives alone   Caffeine use: very little   Right handed       3 children    2 in [place] and one in near [facility] graduate and played basketball there   Graduated in 59    Social Determinants of Health   Financial Resource Strain:   . Difficulty of Paying Living Expenses:   Food Insecurity: No Food Insecurity  . Worried About Charity fundraiser in the Last Year: Never true  . Ran Out of Food in the Last Year: Never true  Transportation Needs: No Transportation Needs  . Lack of Transportation (Medical): No  . Lack of  Transportation (Non-Medical): No  Physical Activity:   . Days of Exercise per Week:   . Minutes of Exercise per Session:   Stress:   . Feeling of Stress :   Social Connections: Unknown  . Frequency of Communication with Friends and Family: More than three times a week  . Frequency of Social Gatherings with Friends and Family: Not on file  . Attends Religious Services: Not on file  . Active Member of Clubs or Organizations: Not on file  . Attends Archivist Meetings: Not on file  . Marital Status: Widowed  Intimate Partner Violence:   . Fear of Current or Ex-Partner:   . Emotionally Abused:   Marland Kitchen Physically Abused:   . Sexually Abused:     Outpatient Medications Prior to Visit  Medication Sig Dispense Refill  . acetaminophen (TYLENOL) 650 MG CR tablet Take 650 mg by mouth 2 (two) times a day.    Marland Kitchen amLODipine (NORVASC) 5 MG tablet TAKE 1 TABLET DAILY 90 tablet 3  . apixaban (ELIQUIS) 5 MG TABS tablet Take 1 tablet (5 mg total) by mouth 2 (two) times daily. 180 tablet 3  . atorvastatin (LIPITOR) 20 MG tablet TAKE 1 TABLET DAILY (Patient taking differently: Take 20 mg by mouth daily. ) 90 tablet 3  . Cholecalciferol (VITAMIN D3) 50 MCG (2000 UT) TABS Take 2,000 Units by mouth daily.    . clonazePAM (KLONOPIN) 0.5 MG tablet Take 1 tablet (0.5 mg total) by mouth daily. 30 tablet 2  . diclofenac Sodium (VOLTAREN) 1 % GEL Apply topically.    . finasteride (PROSCAR) 5 MG tablet TAKE 1 TABLET DAILY 90 tablet 0  . hydrochlorothiazide (MICROZIDE) 12.5 MG capsule Take 1 capsule (12.5 mg total) by mouth daily. 90 capsule 2  . levothyroxine (SYNTHROID) 75 MCG tablet TAKE 1 TABLET DAILY 90 tablet 0  . lisinopril (ZESTRIL) 20 MG tablet TAKE 1 TABLET DAILY 90 tablet 3  . Multiple Vitamin (MULTIVITAMIN WITH MINERALS) TABS tablet Take 1 tablet by mouth daily. Senior Plus Multivitamin    . omeprazole (PRILOSEC) 20 MG capsule TAKE 1 CAPSULE DAILY 90 capsule 1  . pyridOXINE (VITAMIN B-6) 100 MG  tablet Take 100 mg by mouth daily.    . timolol (TIMOPTIC) 0.5 % ophthalmic solution Place 1 drop into the left eye 2 (two) times daily.     . finasteride (PROSCAR) 5 MG tablet TAKE 1 TABLET BY MOUTH EVERY DAY 90 tablet 0  . HYDROcodone-acetaminophen (NORCO) 7.5-325 MG tablet Take 1 tablet by mouth every 6 (six) hours as needed for moderate pain. 20 tablet 0   No facility-administered medications prior to visit.    No Known Allergies  ROS Review of Systems  Constitutional: Negative for appetite change, chills, fever and unexpected weight change.  Respiratory: Negative  for cough and shortness of breath.   Cardiovascular: Negative for chest pain.  Gastrointestinal: Negative for abdominal pain and blood in stool.  Musculoskeletal: Positive for arthralgias.  Neurological: Negative for weakness.      Objective:    Physical Exam Vitals reviewed.  Cardiovascular:     Rate and Rhythm: Normal rate.  Pulmonary:     Effort: Pulmonary effort is normal.     Breath sounds: Normal breath sounds.  Musculoskeletal:     Right lower leg: No edema.     Left lower leg: No edema.  Neurological:     Mental Status: He is alert and oriented to person, place, and time.     Cranial Nerves: No cranial nerve deficit.     BP 122/74 (BP Location: Left Arm, Patient Position: Sitting, Cuff Size: Normal)   Pulse 67   Temp 98.1 F (36.7 C) (Oral)   Wt 198 lb 6.4 oz (90 kg)   SpO2 94%   BMI 24.15 kg/m  Wt Readings from Last 3 Encounters:  11/01/19 198 lb 6.4 oz (90 kg)  10/28/19 197 lb 8 oz (89.6 kg)  10/05/19 195 lb 12.8 oz (88.8 kg)     Health Maintenance Due  Topic Date Due  . INFLUENZA VACCINE  10/24/2019    There are no preventive care reminders to display for this patient.  Lab Results  Component Value Date   TSH 1.59 10/30/2018   Lab Results  Component Value Date   WBC 5.5 09/28/2019   HGB 14.9 09/28/2019   HCT 44.3 09/28/2019   MCV 95.3 09/28/2019   PLT 186 09/28/2019    Lab Results  Component Value Date   NA 137 09/28/2019   K 4.7 09/28/2019   CO2 27 09/28/2019   GLUCOSE 100 (H) 09/28/2019   BUN 20 09/28/2019   CREATININE 1.22 09/28/2019   BILITOT 1.1 09/28/2019   ALKPHOS 52 09/28/2019   AST 28 09/28/2019   ALT 19 09/28/2019   PROT 7.0 09/28/2019   ALBUMIN 4.0 09/28/2019   CALCIUM 9.5 09/28/2019   ANIONGAP 8 09/28/2019   GFR 71.82 10/30/2018   Lab Results  Component Value Date   CHOL 161 10/30/2018   Lab Results  Component Value Date   HDL 38.00 (L) 10/30/2018   Lab Results  Component Value Date   LDLCALC 101 (H) 10/30/2018   Lab Results  Component Value Date   TRIG 108.0 10/30/2018   Lab Results  Component Value Date   CHOLHDL 4 10/30/2018   Lab Results  Component Value Date   HGBA1C 6.0 10/18/2008      Assessment & Plan:   #1 hypertension stable and at goal  #2 hyperlipidemia. Patient treated with atorvastatin. He is not sure whether he got labs at the [facility] but if not is overdue at this time -He will check to confirm if he had lab work including lipids and hepatic through the [facility] and bring a copy for [place]  #3 hypothyroidism -Patient confirming whether TSH checked through the [facility] recently  #4 chronic hoarseness. Previous nasolaryngoscopy unrevealing. Question GERD related -Get back on Prilosec 20 mg daily  #5 strong family history of colon cancer brother and sister. We explained that colon cancer screening generally is not done after age 26 but he still wishes to discuss with GI  #6 history of atrial fibrillation. -Maintained on Eliquis  #7 recent melanoma left side of neck  No orders of the defined types were placed in this encounter.  Follow-up: Return in about 6 months (around 05/03/2020).    Carolann Littler, MD

## 2019-11-09 ENCOUNTER — Telehealth: Payer: Self-pay | Admitting: *Deleted

## 2019-11-09 NOTE — Telephone Encounter (Addendum)
  From Dr Jannifer Franklin: Please set the patient up to see me on the next revisit, sometime in the next 3 or 4 months.  Thank you.  He currently set up to see Judson Roch, but they have requested me.

## 2019-11-09 NOTE — Telephone Encounter (Signed)
I called the pt. He had a previously scheduled appt set for October 2021 w/ Daniel Everett but he saw Daniel Everett August 2021 and then had a 6 month appt also scheduled w/ Daniel Everett for Feb 2022. The pt agreed to cancel the October appt with Daniel Everett and the 6 month follow up with Daniel Roch Everett has been switched to Daniel Everett and moved to the first afternoon appt available with Daniel Everett which is 04/12/2020 at 3:30 pm. I offered pt sooner appointments as early as October but he preferred afternoon due to his part time job. The pt was placed on the wait list in event a sooner afternoon appt becomes available. He will give the Clonazepam 6 weeks and then call Daniel Everett with an update.

## 2019-11-17 ENCOUNTER — Telehealth: Payer: Self-pay | Admitting: Family Medicine

## 2019-11-17 NOTE — Telephone Encounter (Signed)
Pt dropped off a letter stating he completed the Prilosec 14-day treatment course and does not recall if PCP mention what to do after?   Pt would like a call back at [phone] Placed letter in [place] folder

## 2019-11-19 NOTE — Telephone Encounter (Signed)
I would have him remain on Prilosec for at least a month to see if this makes any difference in his hoarseness.  I do not recall seeing any letter

## 2019-11-22 ENCOUNTER — Telehealth: Payer: Self-pay | Admitting: Neurology

## 2019-11-22 MED ORDER — CLONAZEPAM 0.5 MG PO TABS: 0.5000 mg | ORAL_TABLET | Freq: Two times a day (BID) | ORAL | 2 refills | Status: DC

## 2019-11-22 NOTE — Telephone Encounter (Signed)
Pt called clonazePAM (KLONOPIN) 0.5 MG tablet has no or little effect. Pt ask will renew same prescription or increase dosage or do something difference. Pt would like a call from the nurse.

## 2019-11-22 NOTE — Telephone Encounter (Signed)
I called the patient.  The patient is tolerating the low-dose clonazepam fairly well but he does not believe that he is getting much benefit with the tremor.  We will try going to 0.5 mg twice daily on the medication.  He is to look out for drowsiness or cognitive slowing.

## 2019-11-22 NOTE — Addendum Note (Signed)
Addended by: Kathrynn Ducking on: 11/22/2019 04:25 PM   Modules accepted: Orders

## 2019-11-23 NOTE — Telephone Encounter (Signed)
Called pt no answer °

## 2019-11-25 NOTE — Telephone Encounter (Signed)
He is perfectly OK to take longer than 14 days.  We have some patients who have been on this for years.

## 2019-11-25 NOTE — Telephone Encounter (Signed)
Pt stated that his message wasn't regarding what to after 14 days of taking the medication. Pt stated that his concern was he read on the back of the Rx box that the medication should not be taking more than 14 consecutive days. Pt stated that he know Dr.Burchette advised a month but he wants to make sure that he is following what Dr.Burchette says as well as the Rx warning.    Please advise

## 2019-11-26 NOTE — Telephone Encounter (Signed)
Left message to return phone call.

## 2019-11-30 ENCOUNTER — Telehealth: Payer: Self-pay | Admitting: Family Medicine

## 2019-11-30 NOTE — Telephone Encounter (Signed)
Pt was returning a call. Call back when able.

## 2019-11-30 NOTE — Telephone Encounter (Signed)
Left message to return phone call. Closing note. Pt advise to call back

## 2019-12-01 NOTE — Telephone Encounter (Signed)
Patient notified of update  and verbalized understanding.

## 2019-12-14 ENCOUNTER — Telehealth: Payer: Self-pay | Admitting: Family Medicine

## 2019-12-14 NOTE — Telephone Encounter (Signed)
Patient is taking 2nd course of Prilosec and wonders if he should keep taking it or discontinue now?  Pt states he feels like it may be helping some.

## 2019-12-14 NOTE — Telephone Encounter (Signed)
Please advise 

## 2019-12-14 NOTE — Telephone Encounter (Signed)
I would have not reservations with him taking this for another month or two if symptoms improving.

## 2019-12-15 NOTE — Telephone Encounter (Signed)
LMOVM stating that provider does not have any problems if he sees improvement and would like to continue for 1-2 months/advised that can call back if has any questions/thx dmf

## 2019-12-16 ENCOUNTER — Other Ambulatory Visit: Payer: Self-pay | Admitting: Cardiology

## 2019-12-16 ENCOUNTER — Other Ambulatory Visit: Payer: Self-pay | Admitting: Family Medicine

## 2019-12-20 ENCOUNTER — Ambulatory Visit: Payer: Medicare HMO | Admitting: Gastroenterology

## 2019-12-20 ENCOUNTER — Telehealth: Payer: Self-pay | Admitting: Cardiology

## 2019-12-20 ENCOUNTER — Encounter: Payer: Self-pay | Admitting: Gastroenterology

## 2019-12-20 ENCOUNTER — Telehealth: Payer: Self-pay

## 2019-12-20 VITALS — BP 128/80 | HR 66 | Ht 76.0 in | Wt 195.6 lb

## 2019-12-20 DIAGNOSIS — Z86010 Personal history of colonic polyps: Secondary | ICD-10-CM

## 2019-12-20 DIAGNOSIS — Z800 Family history of malignant neoplasm of digestive organs: Secondary | ICD-10-CM | POA: Diagnosis not present

## 2019-12-20 DIAGNOSIS — Z7901 Long term (current) use of anticoagulants: Secondary | ICD-10-CM | POA: Diagnosis not present

## 2019-12-20 MED ORDER — NA SULFATE-K SULFATE-MG SULF 17.5-3.13-1.6 GM/177ML PO SOLN: 1.0000 | Freq: Once | ORAL | 0 refills | Status: AC

## 2019-12-20 NOTE — Telephone Encounter (Signed)
Patient states he received a letter about a transmission he missed.

## 2019-12-20 NOTE — Telephone Encounter (Signed)
[facility] Pre-operative Risk Assessment     Request for surgical clearance:     Endoscopy Procedure  What type of surgery is being performed?     Colonoscopy  When is this surgery scheduled?     02/04/20  What type of clearance is required ?   Pharmacy  Are there any medications that need to be held prior to surgery and how long? Eliquis x 2 days  Practice name and name of physician performing surgery?      [facility]  What is your office phone and fax number?      Phone- [phone]  [phone]  Anesthesia type (None, local, MAC, general) ?       MAC

## 2019-12-20 NOTE — Telephone Encounter (Signed)
Spoke with pt assisted with manual transmission, device is transmitting.

## 2019-12-20 NOTE — Patient Instructions (Signed)
You have been scheduled for a colonoscopy. Please follow written instructions given to you at your visit today.  Please pick up your prep supplies at the pharmacy within the next 1-3 days. If you use inhalers (even only as needed), please bring them with you on the day of your procedure.  Thank you for choosing me and [facility].  Malcolm T. Stark, Jr., MD., FACG  

## 2019-12-20 NOTE — Progress Notes (Signed)
History of Present Illness: [name] referred by Daniel Post, MD for the evaluation of family history of colon cancer. Sister at 3 was just diagnosed with colon cancer. His older brother was diagnosed with colon cancer about age 14. Patient had adenomatous colon polyps removed in 1998. Last colonoscopy was performed in February 2012 showed moderate left colon diverticulosis internal hemorrhoids and no polyps. His health status has been stable. He is maintained on Eliquis for A. fib. No specific gastrointestinal complaints. Denies weight loss, abdominal pain, constipation, diarrhea, change in stool caliber, melena, hematochezia, nausea, vomiting, dysphagia, reflux symptoms, chest pain.    No Known Allergies Outpatient Medications Prior to Visit  Medication Sig Dispense Refill  . acetaminophen (TYLENOL) 650 MG CR tablet Take 650 mg by mouth 2 (two) times a day.    Marland Kitchen amLODipine (NORVASC) 5 MG tablet TAKE 1 TABLET DAILY 90 tablet 3  . apixaban (ELIQUIS) 5 MG TABS tablet Take 1 tablet (5 mg total) by mouth 2 (two) times daily. 180 tablet 3  . atorvastatin (LIPITOR) 20 MG tablet TAKE 1 TABLET DAILY 30 tablet 0  . Cholecalciferol (VITAMIN D3) 50 MCG (2000 UT) TABS Take 2,000 Units by mouth daily.    . clonazePAM (KLONOPIN) 0.5 MG tablet Take 1 tablet (0.5 mg total) by mouth 2 (two) times daily. 60 tablet 2  . diclofenac Sodium (VOLTAREN) 1 % GEL Apply topically.    . finasteride (PROSCAR) 5 MG tablet TAKE 1 TABLET DAILY 90 tablet 0  . hydrochlorothiazide (MICROZIDE) 12.5 MG capsule Take 1 capsule (12.5 mg total) by mouth daily. 90 capsule 2  . levothyroxine (SYNTHROID) 75 MCG tablet TAKE 1 TABLET DAILY 90 tablet 1  . lisinopril (ZESTRIL) 20 MG tablet TAKE 1 TABLET DAILY 90 tablet 3  . Multiple Vitamin (MULTIVITAMIN WITH MINERALS) TABS tablet Take 1 tablet by mouth daily. Senior Plus Multivitamin    . omeprazole (PRILOSEC) 20 MG capsule TAKE 1 CAPSULE DAILY 90 capsule 1  .  pyridOXINE (VITAMIN B-6) 100 MG tablet Take 100 mg by mouth daily.    . timolol (TIMOPTIC) 0.5 % ophthalmic solution Place 1 drop into the left eye 2 (two) times daily.      No facility-administered medications prior to visit.   Past Medical History:  Diagnosis Date  . Allergic rhinitis due to pollen 06/07/2008  . Atrial fibrillation ([place])   . Benign essential HTN   . COLONIC POLYPS, HX OF 10/28/2006  . DIVERTICULOSIS, COLON 11/27/2006  . Gait abnormality 03/02/2018  . GEN OSTEOARTHROSIS INVOLVING MULTIPLE SITES 01/03/2010  . HYPERGLYCEMIA 10/18/2008  . HYPOTHYROIDISM 11/27/2006  . NEOPLASM, MALIGNANT, PROSTATE, HX OF, S/P TURP 05/28/2007  . OSTEOARTHRITIS 11/27/2006  . Peripheral neuropathy 04/08/2018  . Presence of permanent cardiac pacemaker 08/20/2018  . WEIGHT LOSS, RECENT 11/27/2006   Past Surgical History:  Procedure Laterality Date  . arthroscopy rt knee    . 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 HYDROCELE EXCISION / REPAIR    . INSERT / REPLACE / REMOVE PACEMAKER  08/20/2018  . PACEMAKER IMPLANT N/A 08/20/2018   Procedure: PACEMAKER IMPLANT;  Surgeon: Constance Haw, MD;  Location: [facility];  Service: Cardiovascular;  Laterality: N/A;  . PROSTATE SURGERY     TURP  . ROTATOR CUFF REPAIR  Social History   Socioeconomic History  . Marital status: Widowed    Spouse name: Not on file  . Number of children: 3  . Years of education: 4 years college  . Highest education level: Bachelor's degree (e.g., BA, AB, BS)  Occupational History  . Occupation: retired  Tobacco Use  . Smoking status: Never Smoker  . Smokeless tobacco: Never Used  Vaping Use  . Vaping Use: Never used  Substance and Sexual Activity  . Alcohol use: No  . Drug use: No  . Sexual activity: Not Currently  Other Topics Concern  . Not on file    Social History Narrative   Lives alone   Caffeine use: very little   Right handed       3 children    2 in [place] and one in near [facility] graduate and played basketball there   Graduated in 59    Social Determinants of Health   Financial Resource Strain:   . Difficulty of Paying Living Expenses: Not on file  Food Insecurity: No Food Insecurity  . Worried About Charity fundraiser in the Last Year: Never true  . Ran Out of Food in the Last Year: Never true  Transportation Needs: No Transportation Needs  . Lack of Transportation (Medical): No  . Lack of Transportation (Non-Medical): No  Physical Activity:   . Days of Exercise per Week: Not on file  . Minutes of Exercise per Session: Not on file  Stress:   . Feeling of Stress : Not on file  Social Connections: Unknown  . Frequency of Communication with Friends and Family: More than three times a week  . Frequency of Social Gatherings with Friends and Family: Not on file  . Attends Religious Services: Not on file  . Active Member of Clubs or Organizations: Not on file  . Attends Archivist Meetings: Not on file  . Marital Status: Widowed   Family History  Problem Relation Age of Onset  . Coronary artery disease Mother   . Heart disease Mother   . Pancreatic cancer Father   . Cancer Father   . Tremor Sister   . Colon cancer Sister        2021      Review of Systems: Pertinent positive and negative review of systems were noted in the above HPI section. All other review of systems were otherwise negative.   Physical Exam: General: Well developed, well nourished, no acute distress Head: Normocephalic and atraumatic Eyes:  sclerae anicteric, EOMI Ears: Normal auditory acuity Mouth: Not examined, mask on during Covid-19 pandemic Neck: Supple, no masses or thyromegaly Lungs: Clear throughout to auscultation Heart: Regular rate and rhythm; no murmurs, rubs or bruits Abdomen: Soft, non tender  and non distended. No masses, hepatosplenomegaly or hernias noted. Normal Bowel sounds Rectal: Deferred to colonoscopy Musculoskeletal: Symmetrical with no gross deformities  Skin: No lesions on visible extremities Pulses:  Normal pulses noted Extremities: No clubbing, cyanosis, edema or deformities noted Neurological: Alert oriented x 4, grossly nonfocal Cervical Nodes:  No significant cervical adenopathy Inguinal Nodes: No significant inguinal adenopathy Psychological:  Alert and cooperative. Normal mood and affect   Assessment and Recommendations:  1. Family history of colon cancer in 2 older siblings. Personal history of adenomatous colon polyps in 1998. We discussed the typical colon cancer screening intervals are now from ages 91 to 67 and that further screening or surveillance in patients ages 22-85 is individualized. He is  very concerned about his siblings history and the fact that is has been almost 10 years since his last colonoscopy. He would like to proceed with colonoscopy which I think is reasonable given his stable health and family history. It is also reasonable to defer colonoscopy. The risks (including bleeding, perforation, infection, missed lesions, medication reactions and possible hospitalization or surgery if complications occur), benefits, and alternatives to colonoscopy with possible biopsy and possible polypectomy were discussed with the patient and they consent to proceed.   2. Afib on Eliquis. Hold Eliquis 2 days before procedure - will instruct when and how to resume after procedure. Low but real risk of cardiovascular event such as heart attack, stroke, embolism, thrombosis or ischemia/infarct of other organs off Eliquis explained and need to seek urgent help if this occurs. The patient consents to proceed. Will communicate by phone or EMR with patient's prescribing provider to confirm that holding Eliquis is reasonable in this case.    cc: Daniel Post,  MD [address] [place],  [place] [address]

## 2019-12-21 ENCOUNTER — Ambulatory Visit (INDEPENDENT_AMBULATORY_CARE_PROVIDER_SITE_OTHER): Payer: Medicare HMO | Admitting: Emergency Medicine

## 2019-12-21 DIAGNOSIS — I4891 Unspecified atrial fibrillation: Secondary | ICD-10-CM

## 2019-12-21 LAB — CUP PACEART REMOTE DEVICE CHECK
Battery Remaining Longevity: 133 mo
Battery Remaining Percentage: 95.5 %
Battery Voltage: 3.01 V
Brady Statistic RV Percent Paced: 92 %
Date Time Interrogation Session: 20210927162053
Implantable Lead Implant Date: 20200528
Implantable Lead Location: 753860
Implantable Pulse Generator Implant Date: 20200528
Lead Channel Impedance Value: 490 Ohm
Lead Channel Pacing Threshold Amplitude: 0.5 V
Lead Channel Pacing Threshold Pulse Width: 0.5 ms
Lead Channel Sensing Intrinsic Amplitude: 3.7 mV
Lead Channel Setting Pacing Amplitude: 0.75 V
Lead Channel Setting Pacing Pulse Width: 0.5 ms
Lead Channel Setting Sensing Sensitivity: 2 mV
Pulse Gen Model: 1272
Pulse Gen Serial Number: 9116714

## 2019-12-21 NOTE — Telephone Encounter (Signed)
Informed patient he can hold Eliquis 2 days prior to his procedure. Patient verbalized understanding. °

## 2019-12-21 NOTE — Telephone Encounter (Signed)
Patient with diagnosis of afib on Eliquis for anticoagulation.    Procedure: Colonoscopy Date of procedure: 02/04/20  CHADS2-VASc score of  3 (HTN, AGE, AGE)  CrCl 53 ml/min  Per office protocol, patient can hold Eliquis for 2 days prior to procedure.

## 2019-12-23 DIAGNOSIS — Z85828 Personal history of other malignant neoplasm of skin: Secondary | ICD-10-CM | POA: Diagnosis not present

## 2019-12-23 DIAGNOSIS — D229 Melanocytic nevi, unspecified: Secondary | ICD-10-CM | POA: Diagnosis not present

## 2019-12-23 DIAGNOSIS — D1801 Hemangioma of skin and subcutaneous tissue: Secondary | ICD-10-CM | POA: Diagnosis not present

## 2019-12-23 DIAGNOSIS — L814 Other melanin hyperpigmentation: Secondary | ICD-10-CM | POA: Diagnosis not present

## 2019-12-23 DIAGNOSIS — Z85820 Personal history of malignant melanoma of skin: Secondary | ICD-10-CM | POA: Diagnosis not present

## 2019-12-23 DIAGNOSIS — L570 Actinic keratosis: Secondary | ICD-10-CM | POA: Diagnosis not present

## 2019-12-23 DIAGNOSIS — L821 Other seborrheic keratosis: Secondary | ICD-10-CM | POA: Diagnosis not present

## 2019-12-23 DIAGNOSIS — L819 Disorder of pigmentation, unspecified: Secondary | ICD-10-CM | POA: Diagnosis not present

## 2019-12-23 DIAGNOSIS — I8393 Asymptomatic varicose veins of bilateral lower extremities: Secondary | ICD-10-CM | POA: Diagnosis not present

## 2019-12-23 DIAGNOSIS — L905 Scar conditions and fibrosis of skin: Secondary | ICD-10-CM | POA: Diagnosis not present

## 2019-12-24 NOTE — Progress Notes (Signed)
Remote pacemaker transmission.   

## 2020-01-05 ENCOUNTER — Ambulatory Visit: Payer: Medicare HMO | Admitting: Neurology

## 2020-01-08 ENCOUNTER — Other Ambulatory Visit: Payer: Self-pay | Admitting: Family Medicine

## 2020-01-17 ENCOUNTER — Other Ambulatory Visit: Payer: Self-pay | Admitting: Cardiology

## 2020-01-17 ENCOUNTER — Ambulatory Visit: Payer: Medicare HMO | Admitting: Neurology

## 2020-02-04 ENCOUNTER — Other Ambulatory Visit: Payer: Self-pay

## 2020-02-04 ENCOUNTER — Encounter: Payer: Self-pay | Admitting: Gastroenterology

## 2020-02-04 ENCOUNTER — Ambulatory Visit (AMBULATORY_SURGERY_CENTER): Payer: Medicare HMO | Admitting: Gastroenterology

## 2020-02-04 VITALS — BP 115/78 | HR 63 | Temp 96.9°F | Resp 10 | Ht 76.0 in | Wt 195.0 lb

## 2020-02-04 DIAGNOSIS — D125 Benign neoplasm of sigmoid colon: Secondary | ICD-10-CM

## 2020-02-04 DIAGNOSIS — Z800 Family history of malignant neoplasm of digestive organs: Secondary | ICD-10-CM | POA: Diagnosis not present

## 2020-02-04 DIAGNOSIS — D120 Benign neoplasm of cecum: Secondary | ICD-10-CM

## 2020-02-04 DIAGNOSIS — D128 Benign neoplasm of rectum: Secondary | ICD-10-CM | POA: Diagnosis not present

## 2020-02-04 DIAGNOSIS — D123 Benign neoplasm of transverse colon: Secondary | ICD-10-CM | POA: Diagnosis not present

## 2020-02-04 DIAGNOSIS — D127 Benign neoplasm of rectosigmoid junction: Secondary | ICD-10-CM | POA: Diagnosis not present

## 2020-02-04 DIAGNOSIS — Z86010 Personal history of colonic polyps: Secondary | ICD-10-CM

## 2020-02-04 MED ORDER — SODIUM CHLORIDE 0.9 % IV SOLN: 500.0000 mL | Freq: Once | INTRAVENOUS | Status: DC

## 2020-02-04 NOTE — Progress Notes (Signed)
Called to room to assist during endoscopic procedure.  Patient ID and intended procedure confirmed with present staff. Received instructions for my participation in the procedure from the performing physician.  

## 2020-02-04 NOTE — Op Note (Signed)
[facility] Patient Name: Daniel Everett Procedure Date: 02/04/2020 11:56 AM MRN: [id number] Endoscopist: Ladene Artist , MD Age: 86 Referring MD:  Date of Birth: 02-Feb-1932 Gender: Male Account #: [id number] Procedure:                Colonoscopy Indications:              Surveillance: Personal history of adenomatous                            polyps on last colonoscopy > 5 years ago. Family                            history of colon cancer, 2 first degree relatives. Medicines:                Monitored Anesthesia Care Procedure:                Pre-Anesthesia Assessment:                           - Prior to the procedure, a History and Physical                            was performed, and patient medications and                            allergies were reviewed. The patient's tolerance of                            previous anesthesia was also reviewed. The risks                            and benefits of the procedure and the sedation                            options and risks were discussed with the patient.                            All questions were answered, and informed consent                            was obtained. Prior Anticoagulants: The patient has                            taken Coumadin (warfarin), last dose was 5 days                            prior to procedure. ASA Grade Assessment: III - A                            patient with severe systemic disease. After                            reviewing the risks and benefits, the patient was  deemed in satisfactory condition to undergo the                            procedure.                           After obtaining informed consent, the colonoscope                            was passed under direct vision. Throughout the                            procedure, the patient's blood pressure, pulse, and                            oxygen saturations were monitored continuously.  The                            Colonoscope was introduced through the anus and                            advanced to the the cecum, identified by                            appendiceal orifice and ileocecal valve. The                            ileocecal valve, appendiceal orifice, and rectum                            were photographed. The quality of the bowel                            preparation was good. The colonoscopy was performed                            without difficulty. The patient tolerated the                            procedure well. Scope In: 16:07:37 PM Scope Out: 12:29:08 PM Scope Withdrawal Time: 0 hours 18 minutes 19 seconds  Total Procedure Duration: 0 hours 22 minutes 22 seconds  Findings:                 The perianal and digital rectal examinations were                            normal.                           Five sessile polyps were found in the rectum,                            sigmoid colon, transverse colon (2) and cecum. The  polyps were 5 to 8 mm in size. These polyps were                            removed with a cold snare. Resection and retrieval                            were complete.                           Multiple small-mouthed diverticula were found in                            the left colon. There was no evidence of                            diverticular bleeding.                           A few medium-sized localized angioectasias in the                            distal rectum c/w mild radiation proctitis without                            bleeding.                           Internal hemorrhoids were found during                            retroflexion. The hemorrhoids were small and Grade                            I (internal hemorrhoids that do not prolapse).                           The exam was otherwise without abnormality on                            direct and retroflexion  views. Complications:            No immediate complications. Estimated blood loss:                            None. Estimated Blood Loss:     Estimated blood loss: none. Impression:               - Five 5 to 8 mm polyps in the rectum, in the                            sigmoid colon, in the transverse colon and in the                            cecum, removed with a cold snare. Resected and  retrieved.                           - Mild diverticulosis in the left colon.                           - A few non-bleeding distal rectal angioectasias                            c/w mild radiation proctitis.                           - Internal hemorrhoids.                           - The examination was otherwise normal on direct                            and retroflexion views. Recommendation:           - Resume Coumadin (warfarin) tomorrow at prior                            dose. Refer to managing physician for further                            adjustment of therapy.                           - Patient has a contact number available for                            emergencies. The signs and symptoms of potential                            delayed complications were discussed with the                            patient. Return to normal activities tomorrow.                            Written discharge instructions were provided to the                            patient.                           - Resume previous diet.                           - Continue present medications.                           - Await pathology results.                           - No aspirin, ibuprofen, naproxen, or other  non-steroidal anti-inflammatory drugs for 2 weeks                            after polyp removal.                           - No repeat colonoscopy due to age. Ladene Artist, MD 02/04/2020 12:37:51 PM This report has been signed electronically.

## 2020-02-04 NOTE — Progress Notes (Signed)
PT taken to PACU. Monitors in place. VSS. Report given to RN. 

## 2020-02-04 NOTE — Progress Notes (Signed)
VS taken by C.W. 

## 2020-02-04 NOTE — Patient Instructions (Signed)
YOU HAD AN ENDOSCOPIC PROCEDURE TODAY AT [facility]:   Refer to the procedure report that was given to you for any specific questions about what was found during the examination.  If the procedure report does not answer your questions, please call your gastroenterologist to clarify.  If you requested that your care partner not be given the details of your procedure findings, then the procedure report has been included in a sealed envelope for you to review at your convenience later.  YOU SHOULD EXPECT: Some feelings of bloating in the abdomen. Passage of more gas than usual.  Walking can help get rid of the air that was put into your GI tract during the procedure and reduce the bloating. If you had a lower endoscopy (such as a colonoscopy or flexible sigmoidoscopy) you may notice spotting of blood in your stool or on the toilet paper. If you underwent a bowel prep for your procedure, you may not have a normal bowel movement for a few days.  Please Note:  You might notice some irritation and congestion in your nose or some drainage.  This is from the oxygen used during your procedure.  There is no need for concern and it should clear up in a day or so.  Handout given:  Polyps, Diverticulosis, Hemorrhoids Resume warfarin tomorrow at prior dose Resume previous diet Continue current medications No aspirin, ibuprofen, naproxen or other NSAIDS FOR 2 WEEKS FROM PROCEDURE  SYMPTOMS TO REPORT IMMEDIATELY:   Following lower endoscopy (colonoscopy or flexible sigmoidoscopy):  Excessive amounts of blood in the stool  Significant tenderness or worsening of abdominal pains  Swelling of the abdomen that is new, acute  Fever of 100F or higher  For urgent or emergent issues, a gastroenterologist can be reached at any hour by calling [phone]. Do not use MyChart messaging for urgent concerns.   DIET:  We do recommend a small meal at first, but then you may proceed to your regular diet.   Drink plenty of fluids but you should avoid alcoholic beverages for 24 hours.  ACTIVITY:  You should plan to take it easy for the rest of today and you should NOT DRIVE or use heavy machinery until tomorrow (because of the sedation medicines used during the test).    FOLLOW UP: Our staff will call the number listed on your records 48-72 hours following your procedure to check on you and address any questions or concerns that you may have regarding the information given to you following your procedure. If we do not reach you, we will leave a message.  We will attempt to reach you two times.  During this call, we will ask if you have developed any symptoms of COVID 19. If you develop any symptoms (ie: fever, flu-like symptoms, shortness of breath, cough etc.) before then, please call [phone].  If you test positive for Covid 19 in the 2 weeks post procedure, please call and report this information to [place].    If any biopsies were taken you will be contacted by phone or by letter within the next 1-3 weeks.  Please call us at [phone] if you have not heard about the biopsies in 3 weeks.   SIGNATURES/CONFIDENTIALITY: You and/or your care partner have signed paperwork which will be entered into your electronic medical record.  These signatures attest to the fact that that the information above on your After Visit Summary has been reviewed and is understood.  Full responsibility of the confidentiality  of this discharge information lies with you and/or your care-partner.

## 2020-02-08 ENCOUNTER — Telehealth: Payer: Self-pay

## 2020-02-08 NOTE — Telephone Encounter (Signed)
  Follow up Call-  Call back number 02/04/2020  Post procedure Call Back phone  # [phone]  Permission to leave phone message Yes  Some recent data might be hidden     Patient questions:  Do you have a fever, pain , or abdominal swelling? No. Pain Score  0 *  Have you tolerated food without any problems? Yes.    Have you been able to return to your normal activities? Yes.    Do you have any questions about your discharge instructions: Diet   No. Medications  No. Follow up visit  No.  Do you have questions or concerns about your Care? No.  Actions: * If pain score is 4 or above: 1. No action needed, pain <4.Have you developed a fever since your procedure? no  2.   Have you had an respiratory symptoms (SOB or cough) since your procedure? no  3.   Have you tested positive for COVID 19 since your procedure no  4.   Have you had any family members/close contacts diagnosed with the COVID 19 since your procedure?  no   If yes to any of these questions please route to Joylene John, RN and Joella Prince, RN

## 2020-02-12 NOTE — Progress Notes (Signed)
Cardiology Office Note   Date:  02/15/2020   ID:  Daniel Everett, DOB [birth year], MRN [id number]  PCP:  [name], MD  Cardiologist:  Dr Martinique  Kristol Almanzar Martinique, MD 03/27/2016   History of Present Illness: Daniel Everett is a 86 y.o. male with a history of hypothyroid, hyperglycemia, prostate CA s/p TURP, OA, colon polyps, HTN, RBBB- seen for follow up of Atrial fibrillation.  He was initially seen by Rosaria Ferries PA-C in January 2018 for atrial fibrillation with  HR 50s at baseline, on Xarelto. On no AV nodal blocking agents. Holter ordered. BP up, HCTZ added. BMET 1 week later was ok. Holter w/ no pauses > 3.2 sec, HR generally slow, 52 avg, lowest 21 at 5 am. Echo showed normal EF. Mild MR, mod TR. Severe biatrial enlargement.   He has been seen by Dr. Jannifer Franklin in January for evaluation of tremor, gait instability.  EMG c/w peripheral neuropathy. Other lab studies normal. Was noted to have marked bradycardia with Afib and he subsequently had a Kemp single-chamber pacemaker implanted 08/20/2018. Followed by Dr Curt Bears. Last check in September 2021 was satisfactory.   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   On follow up today he is feeling well. Still has   some memory loss and imbalance. No chest pain, dyspnea, dizziness. 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 States he had lab work done at the [place] this past week.     Past Medical History:  Diagnosis Date  . Allergic rhinitis due to pollen 06/07/2008  . Atrial fibrillation ([place])   . Benign essential HTN   . COLONIC POLYPS, HX OF 10/28/2006  . DIVERTICULOSIS, COLON 11/27/2006  . Gait abnormality 03/02/2018  . GEN OSTEOARTHROSIS INVOLVING MULTIPLE SITES 01/03/2010  . HYPERGLYCEMIA 10/18/2008  . HYPOTHYROIDISM 11/27/2006  . NEOPLASM, MALIGNANT, PROSTATE, HX OF, S/P TURP 05/28/2007  . OSTEOARTHRITIS 11/27/2006  . Peripheral neuropathy 04/08/2018  . Presence of permanent cardiac pacemaker 08/20/2018  .  WEIGHT LOSS, RECENT 11/27/2006    Past Surgical History:  Procedure Laterality Date  . arthroscopy rt knee    . EXCISION MASS NECK Left 01/29/2019   Procedure: 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;  Surgeon: [name], MD;  Location: [facility];  Service: ENT;  Laterality: Left;  . HYDROCELE EXCISION / REPAIR    . INSERT / REPLACE / REMOVE PACEMAKER  08/20/2018  . PACEMAKER IMPLANT N/A 08/20/2018   Procedure: PACEMAKER IMPLANT;  Surgeon: Constance Haw, MD;  Location: [facility];  Service: Cardiovascular;  Laterality: N/A;  . PROSTATE SURGERY     TURP  . ROTATOR CUFF REPAIR      Current Outpatient Medications  Medication Sig Dispense Refill  . acetaminophen (TYLENOL) 650 MG CR tablet Take 650 mg by mouth 2 (two) times a day.    Marland Kitchen amLODipine (NORVASC) 5 MG tablet TAKE 1 TABLET DAILY 90 tablet 3  . apixaban (ELIQUIS) 5 MG TABS tablet Take 1 tablet (5 mg total) by mouth 2 (two) times daily. 180 tablet 3  . atorvastatin (LIPITOR) 20 MG tablet TAKE 1 TABLET DAILY 30 tablet 1  . Cholecalciferol (VITAMIN D3) 50 MCG (2000 UT) TABS Take 2,000 Units by mouth daily.    . clonazePAM (KLONOPIN) 0.5 MG tablet Take 1 tablet (0.5 mg total) by mouth 2 (two) times daily. 60 tablet 2  . diclofenac Sodium (VOLTAREN) 1 % GEL Apply topically.    Marland Kitchen  finasteride (PROSCAR) 5 MG tablet TAKE 1 TABLET DAILY 90 tablet 0  . hydrochlorothiazide (MICROZIDE) 12.5 MG capsule Take 1 capsule (12.5 mg total) by mouth daily. 90 capsule 2  . levothyroxine (SYNTHROID) 75 MCG tablet TAKE 1 TABLET DAILY 90 tablet 1  . lisinopril (ZESTRIL) 20 MG tablet TAKE 1 TABLET DAILY 90 tablet 3  . Multiple Vitamin (MULTIVITAMIN WITH MINERALS) TABS tablet Take 1 tablet by mouth daily. Senior Plus Multivitamin    . omeprazole (PRILOSEC) 20 MG capsule TAKE 1 CAPSULE DAILY 90 capsule 1  . pyridOXINE (VITAMIN B-6) 100 MG tablet Take 100 mg by mouth daily.    . timolol (TIMOPTIC) 0.5 % ophthalmic solution Place 1  drop into the left eye 2 (two) times daily.      No current facility-administered medications for this visit.    Allergies:   Patient has no known allergies.    Social History:  The patient  reports that he has never smoked. He has never used smokeless tobacco. He reports that he does not drink alcohol and does not use drugs.   Family History:  The patient's family history includes Cancer in his father; Colon cancer in his sister; Coronary artery disease in his mother; Heart disease in his mother; Pancreatic cancer in his father; Tremor in his sister.    ROS:  Please see the history of present illness. All other systems are reviewed and negative.    PHYSICAL EXAM: VS:  BP 98/61   Pulse 60   Ht 6\' 4"  (1.93 m)   Wt 195 lb 3.2 oz (88.5 kg)   SpO2 98%   BMI 23.76 kg/m  , BMI Body mass index is 23.76 kg/m. GENERAL:  Well appearing, elderly WM in NAD HEENT:  PERRL, EOMI, sclera are clear. Oropharynx is clear. NECK:  No jugular venous distention, carotid upstroke brisk and symmetric, no bruits, no thyromegaly or adenopathy LUNGS:  Clear to auscultation bilaterally CHEST:  Unremarkable HEART:  RRR,    PMI not displaced or sustained,S1 and S2 within normal limits, no S3, no S4: no clicks, no rubs, no murmurs ABD:  Soft, nontender. BS +, no masses or bruits. No hepatomegaly, no splenomegaly EXT:  2 + pulses throughout, no edema, no cyanosis no clubbing SKIN:  Warm and dry.  No rashes NEURO:  Alert and oriented x 3. Cranial nerves II through XII intact. PSYCH:  Cognitively intact   Recent Labs: 09/28/2019: ALT 19; BUN 20; Creatinine 1.22; Hemoglobin 14.9; Platelet Count 186; Potassium 4.7; Sodium 137    Lipid Panel    Component Value Date/Time   CHOL 161 10/30/2018 0853   TRIG 108.0 10/30/2018 0853   HDL 38.00 (L) 10/30/2018 0853   CHOLHDL 4 10/30/2018 0853   VLDL 21.6 10/30/2018 0853   LDLCALC 101 (H) 10/30/2018 0853     Wt Readings from Last 3 Encounters:  02/15/20 195 lb  3.2 oz (88.5 kg)  02/04/20 195 lb (88.5 kg)  12/20/19 195 lb 9.6 oz (88.7 kg)     Other studies Reviewed: Additional studies/ records that were reviewed today include: office notes and testing.  Holter 04/04/16: Study Highlights    Atrial fibrillation with slow ventricular response  Rare PVCs and PVC couplets  Longest pause 3.2 seconds    Echo 04/08/16: Study Conclusions  - Left ventricle: The cavity size was normal. Wall thickness was   normal. Systolic function was normal. The estimated ejection   fraction was in the range of 55% to 60%. Wall motion  was normal;   there were no regional wall motion abnormalities. The study is   not technically sufficient to allow evaluation of LV diastolic   function. - Aortic valve: There was trivial regurgitation. - Mitral valve: There was mild regurgitation. - Left atrium: The atrium was severely dilated. - Right atrium: The atrium was severely dilated. - Atrial septum: No defect or patent foramen ovale was identified. - Tricuspid valve: There was moderate regurgitation.  ASSESSMENT AND PLAN:  1.  Atrial fib-  chronic with slow ventricular response. Apparent syncopal episode in January. S/p PPM placement in May. Clinically doing well. Continue Eliquis.   2. Chronic anticoag: CHA2DS2VASc=3 (age x 2, HTN). Continue Eliquis.   3. HTN- controlled.  4. Peripheral Neuropathy. Followed by Dr. Jannifer Franklin.   Current medicines are reviewed at length with the patient today.  The patient has concerns regarding medicines. Concerns were addressed.  The following changes have been made:  no change  Labs/ tests ordered today include:   No orders of the defined types were placed in this encounter.  Follow up in one year  Signed, Maha Fischel Martinique, MD  02/15/2020 3:21 PM    [facility]

## 2020-02-15 ENCOUNTER — Other Ambulatory Visit: Payer: Self-pay

## 2020-02-15 ENCOUNTER — Encounter: Payer: Self-pay | Admitting: Gastroenterology

## 2020-02-15 ENCOUNTER — Ambulatory Visit: Payer: Medicare HMO | Admitting: Cardiology

## 2020-02-15 ENCOUNTER — Encounter: Payer: Self-pay | Admitting: Cardiology

## 2020-02-15 VITALS — BP 98/61 | HR 60 | Ht 76.0 in | Wt 195.2 lb

## 2020-02-15 DIAGNOSIS — I4891 Unspecified atrial fibrillation: Secondary | ICD-10-CM | POA: Diagnosis not present

## 2020-02-15 DIAGNOSIS — R55 Syncope and collapse: Secondary | ICD-10-CM | POA: Diagnosis not present

## 2020-02-15 DIAGNOSIS — Z950 Presence of cardiac pacemaker: Secondary | ICD-10-CM

## 2020-02-16 ENCOUNTER — Telehealth: Payer: Self-pay | Admitting: Cardiology

## 2020-02-16 MED ORDER — ATORVASTATIN CALCIUM 20 MG PO TABS
20.0000 mg | ORAL_TABLET | Freq: Every day | ORAL | 3 refills | Status: DC
Start: 2020-02-16 — End: 2020-03-07

## 2020-02-16 NOTE — Telephone Encounter (Signed)
Patient returned Kayla's call, states that as long as the Atorvastatin was sent to [facility] he does not need a return call. Thank you!

## 2020-02-16 NOTE — Telephone Encounter (Signed)
Pt c/o medication issue:  1. Name of Medication:   atorvastatin (LIPITOR) 20 MG tablet   2. How are you currently taking this medication (dosage and times per day)? 1 tablet by mouth daily   3. Are you having a reaction (difficulty breathing--STAT)? No   4. What is your medication issue? Marce is calling wanting to know why this prescription was sent as a 30 day supply instead of a 90 day to his mail order service. Please advise.

## 2020-02-16 NOTE — Telephone Encounter (Signed)
Left message for patient to call back. 90 day supply sent to [facility] for Atorvastatin.

## 2020-02-26 ENCOUNTER — Other Ambulatory Visit: Payer: Self-pay | Admitting: Neurology

## 2020-02-28 ENCOUNTER — Telehealth: Payer: Self-pay | Admitting: Family Medicine

## 2020-02-28 ENCOUNTER — Telehealth: Payer: Self-pay | Admitting: *Deleted

## 2020-02-28 NOTE — Telephone Encounter (Signed)
Left message for patient to call back and schedule Medicare Annual Wellness Visit (AWV) either virtually or in office.   Last AWV 04/20/2018  please schedule at anytime with [facility] Nurse Health Advisor 1 or 2   This should be a 45 minute visit. 

## 2020-02-28 NOTE — Telephone Encounter (Signed)
Pt called the office back stating that he has already done this at his house the earlier part of the year

## 2020-02-28 NOTE — Telephone Encounter (Signed)
error 

## 2020-02-28 NOTE — Telephone Encounter (Signed)
Error

## 2020-03-04 ENCOUNTER — Other Ambulatory Visit: Payer: Self-pay | Admitting: Cardiology

## 2020-03-10 DIAGNOSIS — D3131 Benign neoplasm of right choroid: Secondary | ICD-10-CM | POA: Diagnosis not present

## 2020-03-10 DIAGNOSIS — H5213 Myopia, bilateral: Secondary | ICD-10-CM | POA: Diagnosis not present

## 2020-03-10 DIAGNOSIS — Z961 Presence of intraocular lens: Secondary | ICD-10-CM | POA: Diagnosis not present

## 2020-03-21 ENCOUNTER — Ambulatory Visit (INDEPENDENT_AMBULATORY_CARE_PROVIDER_SITE_OTHER): Payer: Medicare HMO

## 2020-03-21 DIAGNOSIS — I4891 Unspecified atrial fibrillation: Secondary | ICD-10-CM | POA: Diagnosis not present

## 2020-03-21 LAB — CUP PACEART REMOTE DEVICE CHECK
Battery Remaining Longevity: 143 mo
Battery Remaining Percentage: 95.5 %
Battery Voltage: 3.01 V
Brady Statistic RV Percent Paced: 92 %
Date Time Interrogation Session: 20211228020012
Implantable Lead Implant Date: 20200528
Implantable Lead Location: 753860
Implantable Pulse Generator Implant Date: 20200528
Lead Channel Impedance Value: 510 Ohm
Lead Channel Pacing Threshold Amplitude: 0.625 V
Lead Channel Pacing Threshold Pulse Width: 0.5 ms
Lead Channel Sensing Intrinsic Amplitude: 3.6 mV
Lead Channel Setting Pacing Amplitude: 0.875
Lead Channel Setting Pacing Pulse Width: 0.5 ms
Lead Channel Setting Sensing Sensitivity: 2 mV
Pulse Gen Model: 1272
Pulse Gen Serial Number: 9116714

## 2020-03-31 ENCOUNTER — Telehealth: Payer: Self-pay

## 2020-03-31 ENCOUNTER — Other Ambulatory Visit: Payer: Self-pay | Admitting: Oncology

## 2020-03-31 DIAGNOSIS — C434 Malignant melanoma of scalp and neck: Secondary | ICD-10-CM

## 2020-03-31 NOTE — Telephone Encounter (Signed)
Received call form pt confused about his appointments unable to remember appts times and dates this nurse not confident pt able to remember this nurse calls and leaves message for daughter explaining upcoming appointments

## 2020-04-03 NOTE — Progress Notes (Signed)
Remote pacemaker transmission.   

## 2020-04-04 ENCOUNTER — Ambulatory Visit (HOSPITAL_COMMUNITY): Payer: Medicare HMO

## 2020-04-04 ENCOUNTER — Inpatient Hospital Stay: Payer: Medicare HMO | Attending: Oncology

## 2020-04-04 ENCOUNTER — Other Ambulatory Visit: Payer: Self-pay

## 2020-04-04 DIAGNOSIS — C434 Malignant melanoma of scalp and neck: Secondary | ICD-10-CM | POA: Insufficient documentation

## 2020-04-04 LAB — CBC WITH DIFFERENTIAL (CANCER CENTER ONLY)
Abs Immature Granulocytes: 0.01 10*3/uL (ref 0.00–0.07)
Basophils Absolute: 0 10*3/uL (ref 0.0–0.1)
Basophils Relative: 1 %
Eosinophils Absolute: 0.1 10*3/uL (ref 0.0–0.5)
Eosinophils Relative: 1 %
HCT: 43 % (ref 39.0–52.0)
Hemoglobin: 14.5 g/dL (ref 13.0–17.0)
Immature Granulocytes: 0 %
Lymphocytes Relative: 34 %
Lymphs Abs: 1.8 10*3/uL (ref 0.7–4.0)
MCH: 32.2 pg (ref 26.0–34.0)
MCHC: 33.7 g/dL (ref 30.0–36.0)
MCV: 95.6 fL (ref 80.0–100.0)
Monocytes Absolute: 0.5 10*3/uL (ref 0.1–1.0)
Monocytes Relative: 10 %
Neutro Abs: 2.8 10*3/uL (ref 1.7–7.7)
Neutrophils Relative %: 54 %
Platelet Count: 189 10*3/uL (ref 150–400)
RBC: 4.5 MIL/uL (ref 4.22–5.81)
RDW: 13.2 % (ref 11.5–15.5)
WBC Count: 5.1 10*3/uL (ref 4.0–10.5)
nRBC: 0 % (ref 0.0–0.2)

## 2020-04-04 LAB — CMP (CANCER CENTER ONLY)
ALT: 17 U/L (ref 0–44)
AST: 25 U/L (ref 15–41)
Albumin: 3.8 g/dL (ref 3.5–5.0)
Alkaline Phosphatase: 58 U/L (ref 38–126)
Anion gap: 5 (ref 5–15)
BUN: 18 mg/dL (ref 8–23)
CO2: 30 mmol/L (ref 22–32)
Calcium: 9.2 mg/dL (ref 8.9–10.3)
Chloride: 103 mmol/L (ref 98–111)
Creatinine: 1.08 mg/dL (ref 0.61–1.24)
GFR, Estimated: >60 mL/min (ref >60)
Glucose, Bld: 97 mg/dL (ref 70–99)
Potassium: 4 mmol/L (ref 3.5–5.1)
Sodium: 138 mmol/L (ref 135–145)
Total Bilirubin: 1 mg/dL (ref 0.3–1.2)
Total Protein: 7 g/dL (ref 6.5–8.1)

## 2020-04-05 ENCOUNTER — Other Ambulatory Visit: Payer: Medicare HMO

## 2020-04-11 ENCOUNTER — Encounter (HOSPITAL_COMMUNITY): Payer: Self-pay

## 2020-04-11 ENCOUNTER — Ambulatory Visit (HOSPITAL_COMMUNITY)
Admission: RE | Admit: 2020-04-11 | Discharge: 2020-04-11 | Disposition: A | Discharge status: Home / Self Care | Payer: Medicare HMO | Source: Ambulatory Visit | Attending: Oncology | Admitting: Oncology

## 2020-04-11 ENCOUNTER — Other Ambulatory Visit: Payer: Self-pay

## 2020-04-11 DIAGNOSIS — C439 Malignant melanoma of skin, unspecified: Secondary | ICD-10-CM | POA: Diagnosis not present

## 2020-04-11 DIAGNOSIS — I700 Atherosclerosis of aorta: Secondary | ICD-10-CM | POA: Diagnosis not present

## 2020-04-11 DIAGNOSIS — C434 Malignant melanoma of scalp and neck: Secondary | ICD-10-CM

## 2020-04-11 DIAGNOSIS — J984 Other disorders of lung: Secondary | ICD-10-CM | POA: Diagnosis not present

## 2020-04-11 DIAGNOSIS — K862 Cyst of pancreas: Secondary | ICD-10-CM | POA: Diagnosis not present

## 2020-04-11 DIAGNOSIS — N281 Cyst of kidney, acquired: Secondary | ICD-10-CM | POA: Diagnosis not present

## 2020-04-11 DIAGNOSIS — K7689 Other specified diseases of liver: Secondary | ICD-10-CM | POA: Diagnosis not present

## 2020-04-11 DIAGNOSIS — I712 Thoracic aortic aneurysm, without rupture: Secondary | ICD-10-CM | POA: Diagnosis not present

## 2020-04-11 MED ORDER — IOHEXOL 300 MG/ML  SOLN
100.0000 mL | Freq: Once | INTRAMUSCULAR | Status: AC | PRN
  Administered 2020-04-11: 100 mL via INTRAVENOUS

## 2020-04-12 ENCOUNTER — Other Ambulatory Visit: Payer: Self-pay

## 2020-04-12 ENCOUNTER — Ambulatory Visit: Payer: Medicare HMO | Admitting: Oncology

## 2020-04-12 ENCOUNTER — Encounter: Payer: Self-pay | Admitting: Neurology

## 2020-04-12 ENCOUNTER — Ambulatory Visit: Payer: Medicare HMO | Admitting: Neurology

## 2020-04-12 VITALS — BP 131/84 | HR 59 | Ht 76.0 in | Wt 195.0 lb

## 2020-04-12 DIAGNOSIS — G250 Essential tremor: Secondary | ICD-10-CM

## 2020-04-12 MED ORDER — HYDROCHLOROTHIAZIDE 12.5 MG PO CAPS
12.5000 mg | ORAL_CAPSULE | Freq: Every day | ORAL | 2 refills | Status: DC
Start: 2020-04-12 — End: 2021-01-16

## 2020-04-12 NOTE — Progress Notes (Signed)
Reason for visit: Essential tremor  Daniel Everett is an 87 y.o. male  History of present illness:  Daniel Everett is an 87 year old right-handed white male with a history of essential tremor.  The patient has been on several different medications without full improvement of the tremor, he currently is on clonazepam taking 0.5 mg twice daily.  He reports some mild gait instability on this dose but he has not had any falls.  He still has a lot of trouble with handwriting in particular, he is able to feed himself fairly well.  He does not do a lot of other activities that require fine motor control.  He lives alone.  He comes to this office for an evaluation.  Past Medical History:  Diagnosis Date  . Allergic rhinitis due to pollen 06/07/2008  . Atrial fibrillation ([place])   . Benign essential HTN   . COLONIC POLYPS, HX OF 10/28/2006  . DIVERTICULOSIS, COLON 11/27/2006  . Gait abnormality 03/02/2018  . GEN OSTEOARTHROSIS INVOLVING MULTIPLE SITES 01/03/2010  . HYPERGLYCEMIA 10/18/2008  . HYPOTHYROIDISM 11/27/2006  . NEOPLASM, MALIGNANT, PROSTATE, HX OF, S/P TURP 05/28/2007  . OSTEOARTHRITIS 11/27/2006  . Peripheral neuropathy 04/08/2018  . Presence of permanent cardiac pacemaker 08/20/2018  . WEIGHT LOSS, RECENT 11/27/2006    Past Surgical History:  Procedure Laterality Date  . arthroscopy rt knee    . EXCISION MASS NECK Left 01/29/2019   Procedure: 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;  Surgeon: [name], MD;  Location: [facility];  Service: ENT;  Laterality: Left;  . HYDROCELE EXCISION / REPAIR    . INSERT / REPLACE / REMOVE PACEMAKER  08/20/2018  . PACEMAKER IMPLANT N/A 08/20/2018   Procedure: PACEMAKER IMPLANT;  Surgeon: Constance Haw, MD;  Location: [facility];  Service: Cardiovascular;  Laterality: N/A;  . PROSTATE SURGERY     TURP  . ROTATOR CUFF REPAIR      Family History  Problem Relation Age of Onset  . Coronary artery disease Mother   . Heart  disease Mother   . Pancreatic cancer Father   . Cancer Father   . Tremor Sister   . Colon cancer Sister        2021  . Esophageal cancer Neg Hx   . Stomach cancer Neg Hx   . Rectal cancer Neg Hx     Social history:  reports that he has never smoked. He has never used smokeless tobacco. He reports that he does not drink alcohol and does not use drugs.   No Known Allergies  Medications:  Prior to Admission medications   Medication Sig Start Date End Date Taking? Authorizing Provider  acetaminophen (TYLENOL) 650 MG CR tablet Take 650 mg by mouth 2 (two) times a day.   Yes [provider]  amLODipine (NORVASC) 5 MG tablet TAKE 1 TABLET DAILY 08/10/19  Yes Martinique, Peter M, MD  apixaban (ELIQUIS) 5 MG TABS tablet Take 1 tablet (5 mg total) by mouth 2 (two) times daily. 05/15/17  Yes Martinique, Peter M, MD  atorvastatin (LIPITOR) 20 MG tablet TAKE 1 TABLET DAILY 03/07/20  Yes Martinique, Peter M, MD  Cholecalciferol (VITAMIN D3) 50 MCG (2000 UT) TABS Take 2,000 Units by mouth daily.   Yes [provider]  clonazePAM (KLONOPIN) 0.5 MG tablet TAKE 1 TABLET BY MOUTH 2 TIMES DAILY. 02/28/20  Yes Kathrynn Ducking, MD  diclofenac Sodium (VOLTAREN) 1 % GEL Apply topically.   Yes [provider]  finasteride (PROSCAR) 5 MG tablet TAKE 1 TABLET DAILY 01/08/20  Yes Burchette, Alinda Sierras, MD  hydrochlorothiazide (MICROZIDE) 12.5 MG capsule Take 1 capsule (12.5 mg total) by mouth daily. 04/12/20  Yes Martinique, Peter M, MD  levothyroxine (SYNTHROID) 75 MCG tablet TAKE 1 TABLET DAILY 12/16/19  Yes Burchette, Alinda Sierras, MD  lisinopril (ZESTRIL) 20 MG tablet TAKE 1 TABLET DAILY 08/10/19  Yes Martinique, Peter M, MD  Multiple Vitamin (MULTIVITAMIN WITH MINERALS) TABS tablet Take 1 tablet by mouth daily. Senior Plus Multivitamin   Yes [provider]  omeprazole (PRILOSEC) 20 MG capsule TAKE 1 CAPSULE DAILY 10/20/19  Yes Martinique, Peter M, MD  pyridOXINE (VITAMIN B-6) 100 MG tablet Take 100 mg by  mouth daily.   Yes [provider]  timolol (TIMOPTIC) 0.5 % ophthalmic solution Place 1 drop into the left eye 2 (two) times daily.   Yes [provider]    ROS:  Out of a complete 14 system review of symptoms, the patient complains only of the following symptoms, and all other reviewed systems are negative.  Tremor Gait instability  Blood pressure 131/84, pulse (!) 59, height 6\' 4"  (1.93 m), weight 195 lb (88.5 kg).  Physical Exam  General: The patient is alert and cooperative at the time of the examination.  Skin: No significant peripheral edema is noted.   Neurologic Exam  Mental status: The patient is alert and oriented x 3 at the time of the examination. The patient has apparent normal recent and remote memory, with an apparently normal attention span and concentration ability.   Cranial nerves: Facial symmetry is present. Speech is normal, no aphasia or dysarthria is noted. Extraocular movements are full. Visual fields are full.  Motor: The patient has good strength in all 4 extremities.  Sensory examination: Soft touch sensation is symmetric on the face, arms, and legs.  Coordination: The patient has good finger-nose-finger and heel-to-shin bilaterally.  The patient does have some tremor with finger-nose-finger bilaterally.  When performing handwriting, he has tremor translated into the handwriting.  Gait and station: The patient has a normal gait. Tandem gait is unsteady. Romberg is negative. No drift is seen.  Reflexes: Deep tendon reflexes are symmetric.   Assessment/Plan:  1.  Essential tremor  The patient is reporting some gait instability, I do not wish to go any higher on the dose of the clonazepam.  We discussed potentially stopping the drug as it is offering only modest benefit but he wishes to continue on the current dose.  He will follow-up here in 6 months.  The patient is not a candidate for a deep brain stimulator at age 76.  Jill Alexanders MD 04/12/2020 3:50 PM  [facility] [address] [place], [place] [address]  Phone [phone] Fax [phone]

## 2020-04-14 ENCOUNTER — Telehealth: Payer: Self-pay | Admitting: Family Medicine

## 2020-04-14 MED ORDER — FINASTERIDE 5 MG PO TABS
5.0000 mg | ORAL_TABLET | Freq: Every day | ORAL | 1 refills | Status: DC
Start: 2020-04-14 — End: 2020-09-18

## 2020-04-14 NOTE — Telephone Encounter (Signed)
Patient states he received a notification from the pharmacy saying they need to speak with Dr. Elease Hashimoto about his Finasteride 5 mg.  Please advise.

## 2020-04-14 NOTE — Telephone Encounter (Signed)
Finasteride rx sent to pharmacy.  Patient aware.

## 2020-04-19 ENCOUNTER — Other Ambulatory Visit: Payer: Self-pay

## 2020-04-19 ENCOUNTER — Inpatient Hospital Stay: Payer: Medicare HMO | Admitting: Oncology

## 2020-04-19 VITALS — BP 124/87 | HR 72 | Temp 98.1°F | Resp 16 | Ht 76.0 in | Wt 193.7 lb

## 2020-04-19 DIAGNOSIS — C434 Malignant melanoma of scalp and neck: Secondary | ICD-10-CM | POA: Diagnosis not present

## 2020-04-19 NOTE — Progress Notes (Signed)
Hematology and Oncology Follow Up Visit  Daniel Everett [id number] [birth year] 87 y.o. 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  He was found to have T2N0 tumor with 1.2 mm depth without ulceration    Prior Therapy:  He is status post wide excision and sentinel lymph node sampling completed in November 2020.  Current therapy: Active surveillance.   Interim History: Mr. Motton is here for a follow-up evaluation.  Since last visit, he reports no major changes in his health.  He continues to live independently and attends activities of daily living.  He denies any recent hospitalization or illnesses.  He denies any skin rashes or lesions.  He denies any constitutional symptoms of weight loss.  Performance status quality of life remain stable.    Medications: Updated on review. Current Outpatient Medications  Medication Sig Dispense Refill  . acetaminophen (TYLENOL) 650 MG CR tablet Take 650 mg by mouth 2 (two) times a day.    Marland Kitchen amLODipine (NORVASC) 5 MG tablet TAKE 1 TABLET DAILY 90 tablet 3  . apixaban (ELIQUIS) 5 MG TABS tablet Take 1 tablet (5 mg total) by mouth 2 (two) times daily. 180 tablet 3  . atorvastatin (LIPITOR) 20 MG tablet TAKE 1 TABLET DAILY 30 tablet 1  . Cholecalciferol (VITAMIN D3) 50 MCG (2000 UT) TABS Take 2,000 Units by mouth daily.    . clonazePAM (KLONOPIN) 0.5 MG tablet TAKE 1 TABLET BY MOUTH 2 TIMES DAILY. 60 tablet 5  . diclofenac Sodium (VOLTAREN) 1 % GEL Apply topically.    . finasteride (PROSCAR) 5 MG tablet Take 1 tablet (5 mg total) by mouth daily. 90 tablet 1  . hydrochlorothiazide (MICROZIDE) 12.5 MG capsule Take 1 capsule (12.5 mg total) by mouth daily. 90 capsule 2  . levothyroxine (SYNTHROID) 75 MCG tablet TAKE 1 TABLET DAILY 90 tablet 1  . lisinopril (ZESTRIL) 20 MG tablet TAKE 1 TABLET DAILY 90 tablet 3  . Multiple Vitamin  (MULTIVITAMIN WITH MINERALS) TABS tablet Take 1 tablet by mouth daily. Senior Plus Multivitamin    . omeprazole (PRILOSEC) 20 MG capsule TAKE 1 CAPSULE DAILY 90 capsule 1  . pyridOXINE (VITAMIN B-6) 100 MG tablet Take 100 mg by mouth daily.    . timolol (TIMOPTIC) 0.5 % ophthalmic solution Place 1 drop into the left eye 2 (two) times daily.     No current facility-administered medications for this visit.     Allergies: No Known Allergies    Physical Exam: Blood pressure 124/87, pulse 72, temperature 98.1 F (36.7 C), temperature source Tympanic, resp. rate 16, height 6\' 4"  (1.93 m), weight 193 lb 11.2 oz (87.9 kg), SpO2 100 %.    ECOG:  1    General appearance: Alert, awake without any distress. Head: Atraumatic without abnormalities Oropharynx: Without any thrush or ulcers. Eyes: No scleral icterus. Lymph nodes: No lymphadenopathy noted in the cervical, supraclavicular, or axillary nodes Heart:regular rate and rhythm, without any murmurs or gallops.   Lung: Clear to auscultation without any rhonchi, wheezes or dullness to percussion. Abdomin: Soft, nontender without any shifting dullness or ascites. Musculoskeletal: No clubbing or cyanosis. Neurological: No motor or sensory deficits. Skin: No rashes or lesions.      Lab Results: Lab Results  Component Value Date   WBC 5.1 04/04/2020   HGB 14.5 04/04/2020   HCT 43.0 04/04/2020   MCV 95.6 04/04/2020   PLT 189  04/04/2020     Chemistry      Component Value Date/Time   NA 138 04/04/2020 1350   NA 139 04/04/2016 1413   K 4.0 04/04/2020 1350   CL 103 04/04/2020 1350   CO2 30 04/04/2020 1350   BUN 18 04/04/2020 1350   BUN 16 04/04/2016 1413   CREATININE 1.08 04/04/2020 1350      Component Value Date/Time   CALCIUM 9.2 04/04/2020 1350   ALKPHOS 58 04/04/2020 1350   AST 25 04/04/2020 1350   ALT 17 04/04/2020 1350   BILITOT 1.0 04/04/2020 1350     EXAM: CT CHEST, ABDOMEN, AND PELVIS WITH  CONTRAST  TECHNIQUE: Multidetector CT imaging of the chest, abdomen and pelvis was performed following the standard protocol during bolus administration of intravenous contrast.  CONTRAST:  169mL OMNIPAQUE IOHEXOL 300 MG/ML  SOLN  COMPARISON:  PET-CT 03/16/2019  FINDINGS: CT CHEST FINDINGS  Cardiovascular: Heart is enlarged. No pericardial effusion. Permanent pacemaker noted. Ascending thoracic aortic aneurysm stable at 4.6 cm diameter.  Mediastinum/Nodes: No mediastinal lymphadenopathy. There is no hilar lymphadenopathy. The esophagus has normal imaging features. There is no axillary lymphadenopathy.  Lungs/Pleura: Biapical pleuroparenchymal scarring evident. No suspicious pulmonary nodule or mass. No focal airspace consolidation. No pleural effusion.  Musculoskeletal: No worrisome lytic or sclerotic osseous abnormality.  CT ABDOMEN PELVIS FINDINGS  Hepatobiliary: Multiple low-density lesions scattered through the liver are stable in the interval. Larger lesions are compatible with cysts. Smaller lesions are too small to characterize but appear unchanged. Gallbladder is nondistended. No intrahepatic or extrahepatic biliary dilation.  Pancreas: 2 cm cystic lesion in the head of pancreas has increased from 14 mm previously. 2nd hypoattenuating area in the uncinate process is also progressive. No main duct dilatation. Interval progression of pancreatic atrophy.  Spleen: No splenomegaly. No focal mass lesion.  Adrenals/Urinary Tract: No adrenal nodule or mass. 4.3 cm left renal cysts with additional tiny low-density renal lesions bilaterally, too small to characterize but likely benign.  Stomach/Bowel: No evidence for hydroureter. Bladder is decompressed.  Vascular/Lymphatic: Stomach is unremarkable. No gastric wall thickening. No evidence of outlet obstruction. Duodenum is normally positioned as is the ligament of Treitz. No small bowel  wall thickening. No small bowel dilatation. The terminal ileum is normal. The appendix is not visualized, but there is no edema or inflammation in the region of the cecum. No gross colonic mass. No colonic wall thickening.  Reproductive: Fiducial markers noted in the prostate region.  Other: No intraperitoneal free fluid.  Musculoskeletal: No worrisome lytic or sclerotic osseous abnormality. Degenerative changes noted in the hips.  IMPRESSION: 1. No evidence for metastatic disease in the chest, abdomen, or pelvis. 2. Interval progression of 82.0 cm cystic lesion in the head and a 2.0 cm focus of hypoenhancement in the uncinate process of pancreas. Permanent pacemaker likely precludes MRI evaluation. Dedicated pancreatic protocol CT of the abdomen recommended to further evaluate. 3. Stable 4.6 cm ascending thoracic aortic aneurysm. Recommend semi-annual imaging followup by CTA or MRA and referral to cardiothoracic surgery if not already obtained. This recommendation follows 2010 ACCF/AHA/AATS/ACR/ASA/SCA/SCAI/SIR/STS/SVM Guidelines for the Diagnosis and Management of Patients With Thoracic Aortic Disease. Circulation. 2010; [id number]. Aortic aneurysm NOS (ICD10-I71.9) 4. Hepatic and left renal cysts. 5. Aortic Atherosclerosis (ICD10-I70.0).  Impression and Plan:   87 year old with:  1.  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  He was found to have T2a N0 lesion without ulceration.  CT scan obtained on April 11, 2020 was  personally reviewed and showed no evidence of metastatic disease in these images.  The natural course of this disease was reviewed and treatment options were discussed.  At this time I recommended continued active surveillance and repeat imaging studies in 1 year.  No further imaging studies will be needed after that.  Systemic treatment in the form of immunotherapy will be deferred unless he has measurable disease at that time.   He is agreeable with this plan.   2.  Dermatology surveillance: I recommended continued surveillance by dermatology.  3.  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  This will be monitored in the future with repeat imaging studies.  4.  Follow-up: He will return in 6 months for repeat evaluation and in 12 months for repeat scans.  30  minutes were spent on this visit.  The time was dedicated to reviewing imaging studies, disease status update management options for future.    Zola Button, MD 1/26/20223:19 PM

## 2020-05-01 ENCOUNTER — Ambulatory Visit: Payer: Medicare HMO | Admitting: Neurology

## 2020-05-03 ENCOUNTER — Other Ambulatory Visit: Payer: Self-pay

## 2020-05-03 ENCOUNTER — Encounter: Payer: Self-pay | Admitting: Family Medicine

## 2020-05-03 ENCOUNTER — Ambulatory Visit (INDEPENDENT_AMBULATORY_CARE_PROVIDER_SITE_OTHER): Payer: Medicare HMO | Admitting: Family Medicine

## 2020-05-03 VITALS — BP 118/80 | HR 60 | Ht 76.0 in | Wt 191.0 lb

## 2020-05-03 DIAGNOSIS — H2510 Age-related nuclear cataract, unspecified eye: Secondary | ICD-10-CM | POA: Insufficient documentation

## 2020-05-03 DIAGNOSIS — H25019 Cortical age-related cataract, unspecified eye: Secondary | ICD-10-CM | POA: Insufficient documentation

## 2020-05-03 DIAGNOSIS — M159 Polyosteoarthritis, unspecified: Secondary | ICD-10-CM | POA: Diagnosis not present

## 2020-05-03 DIAGNOSIS — F528 Other sexual dysfunction not due to a substance or known physiological condition: Secondary | ICD-10-CM | POA: Insufficient documentation

## 2020-05-03 DIAGNOSIS — N433 Hydrocele, unspecified: Secondary | ICD-10-CM | POA: Insufficient documentation

## 2020-05-03 DIAGNOSIS — E038 Other specified hypothyroidism: Secondary | ICD-10-CM | POA: Diagnosis not present

## 2020-05-03 DIAGNOSIS — H4010X0 Unspecified open-angle glaucoma, stage unspecified: Secondary | ICD-10-CM | POA: Insufficient documentation

## 2020-05-03 DIAGNOSIS — I4821 Permanent atrial fibrillation: Secondary | ICD-10-CM

## 2020-05-03 DIAGNOSIS — I10 Essential (primary) hypertension: Secondary | ICD-10-CM | POA: Diagnosis not present

## 2020-05-03 HISTORY — DX: Cortical age-related cataract, unspecified eye: H25.019

## 2020-05-03 HISTORY — DX: Unspecified open-angle glaucoma, stage unspecified: H40.10X0

## 2020-05-03 HISTORY — DX: Age-related nuclear cataract, unspecified eye: H25.10

## 2020-05-03 NOTE — Patient Instructions (Signed)

## 2020-05-03 NOTE — Progress Notes (Signed)
Established Patient Office Visit  Subjective:  Patient ID: Daniel Everett, male    DOB: [birth year]  Age: 87 y.o. MRN: [id number]  CC:  Chief Complaint  Patient presents with  . Follow-up    HPI [name] presents for medical follow-up. He is followed by the [facility] about every 6 months. Chronic problems include history of hypertension, atrial fibrillation, GERD, hypothyroidism, essential tremor, osteoarthritis especially involving right knee, remote history of prostate cancer, and history of melanoma left side of neck. He is followed regularly by dermatology. He remains on Eliquis. He gets his lab work generally through the [place]. He brings in a copy of labs that were done in November. Cholesterol is 136 with LDL cholesterol 76. CBC unremarkable. TSH normal. Liver panel normal. Normal renal function and electrolytes stable.  He also brought in copy of immunizations. It looks like the only thing he needs his second Shingrix vaccine. He did have previous Zostavax and had first Shingrix vaccine May 2021.  Still lives independently. He has 2 sisters that live nearby that cook meals for him and bring food to him weekly. He has had occasional falls but none recently.  His blood pressures been stable. We reviewed medications. No orthostatic symptoms. He is treated with lisinopril, amlodipine, HCTZ. He takes Lipitor for hyperlipidemia. He is on levothyroxine 75 mcg daily for his hypothyroidism. Recent TSH at goal.  He did bring up concerns today regarding possible memory loss. He actually has very good short-term recall. He remembers exactly what he had for breakfast and lunch today. He states this manifest mostly with things like driving when he has struggle sometimes getting from one place to another in town. His recall of things like dates is very good  Past Medical History:  Diagnosis Date  . Allergic rhinitis due to pollen 06/07/2008  . Atrial fibrillation ([place])   . Benign essential HTN   .  COLONIC POLYPS, HX OF 10/28/2006  . DIVERTICULOSIS, COLON 11/27/2006  . Gait abnormality 03/02/2018  . GEN OSTEOARTHROSIS INVOLVING MULTIPLE SITES 01/03/2010  . HYPERGLYCEMIA 10/18/2008  . HYPOTHYROIDISM 11/27/2006  . NEOPLASM, MALIGNANT, PROSTATE, HX OF, S/P TURP 05/28/2007  . OSTEOARTHRITIS 11/27/2006  . Peripheral neuropathy 04/08/2018  . Presence of permanent cardiac pacemaker 08/20/2018  . WEIGHT LOSS, RECENT 11/27/2006    Past Surgical History:  Procedure Laterality Date  . arthroscopy rt knee    . EXCISION MASS NECK Left 01/29/2019   Procedure: 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;  Surgeon: [name], MD;  Location: [facility];  Service: ENT;  Laterality: Left;  . HYDROCELE EXCISION / REPAIR    . INSERT / REPLACE / REMOVE PACEMAKER  08/20/2018  . PACEMAKER IMPLANT N/A 08/20/2018   Procedure: PACEMAKER IMPLANT;  Surgeon: Constance Haw, MD;  Location: [facility];  Service: Cardiovascular;  Laterality: N/A;  . PROSTATE SURGERY     TURP  . ROTATOR CUFF REPAIR      Family History  Problem Relation Age of Onset  . Coronary artery disease Mother   . Heart disease Mother   . Pancreatic cancer Father   . Cancer Father   . Tremor Sister   . Colon cancer Sister        2021  . Esophageal cancer Neg Hx   . Stomach cancer Neg Hx   . Rectal cancer Neg Hx     Social History   Socioeconomic History  . Marital status: Widowed    Spouse name:  Not on file  . Number of children: 3  . Years of education: 4 years college  . Highest education level: Bachelor's degree (e.g., BA, AB, BS)  Occupational History  . Occupation: retired  Tobacco Use  . Smoking status: Never Smoker  . Smokeless tobacco: Never Used  Vaping Use  . Vaping Use: Never used  Substance and Sexual Activity  . Alcohol use: No  . Drug use: No  . Sexual activity: Not Currently  Other Topics Concern  . Not on file  Social History Narrative   Lives alone   Caffeine use: very little    Right handed       3 children    2 in [place] and one in near [facility] graduate and played basketball there   Graduated in 59    Social Determinants of Health   Financial Resource Strain: Not on file  Food Insecurity: Not on file  Transportation Needs: Not on file  Physical Activity: Not on file  Stress: Not on file  Social Connections: Not on file  Intimate Partner Violence: Not on file    Outpatient Medications Prior to Visit  Medication Sig Dispense Refill  . acetaminophen (TYLENOL) 650 MG CR tablet Take 650 mg by mouth 2 (two) times a day.    Marland Kitchen amLODipine (NORVASC) 5 MG tablet TAKE 1 TABLET DAILY 90 tablet 3  . apixaban (ELIQUIS) 5 MG TABS tablet Take 1 tablet (5 mg total) by mouth 2 (two) times daily. 180 tablet 3  . atorvastatin (LIPITOR) 20 MG tablet TAKE 1 TABLET DAILY 30 tablet 1  . Cholecalciferol (VITAMIN D3) 50 MCG (2000 UT) TABS Take 2,000 Units by mouth daily.    . clonazePAM (KLONOPIN) 0.5 MG tablet TAKE 1 TABLET BY MOUTH 2 TIMES DAILY. 60 tablet 5  . diclofenac Sodium (VOLTAREN) 1 % GEL Apply topically.    . finasteride (PROSCAR) 5 MG tablet Take 1 tablet (5 mg total) by mouth daily. 90 tablet 1  . hydrochlorothiazide (MICROZIDE) 12.5 MG capsule Take 1 capsule (12.5 mg total) by mouth daily. 90 capsule 2  . levothyroxine (SYNTHROID) 75 MCG tablet TAKE 1 TABLET DAILY 90 tablet 1  . lisinopril (ZESTRIL) 20 MG tablet TAKE 1 TABLET DAILY 90 tablet 3  . Multiple Vitamin (MULTIVITAMIN WITH MINERALS) TABS tablet Take 1 tablet by mouth daily. Senior Plus Multivitamin    . omeprazole (PRILOSEC) 20 MG capsule TAKE 1 CAPSULE DAILY 90 capsule 1  . pyridOXINE (VITAMIN B-6) 100 MG tablet Take 100 mg by mouth daily.    . timolol (TIMOPTIC) 0.5 % ophthalmic solution Place 1 drop into the left eye 2 (two) times daily.     No facility-administered medications prior to visit.    No Known Allergies  ROS Review of Systems  Constitutional: Negative for chills,  fatigue and unexpected weight change.  Eyes: Negative for visual disturbance.  Respiratory: Negative for cough, chest tightness and shortness of breath.   Cardiovascular: Negative for chest pain, palpitations and leg swelling.  Gastrointestinal: Negative for abdominal pain.  Genitourinary: Negative for dysuria.  Musculoskeletal: Positive for arthralgias.  Neurological: Negative for dizziness, syncope, weakness, light-headedness and headaches.      Objective:    Physical Exam Constitutional:      Appearance: He is well-developed and well-nourished.  HENT:     Right Ear: External ear normal.     Left Ear: External ear normal.     Mouth/Throat:     Mouth: Oropharynx is  clear and moist.  Eyes:     Pupils: Pupils are equal, round, and reactive to light.  Neck:     Thyroid: No thyromegaly.  Cardiovascular:     Rate and Rhythm: Normal rate.     Comments: Irregular rhythm but rate controlled Pulmonary:     Effort: Pulmonary effort is normal. No respiratory distress.     Breath sounds: Normal breath sounds. No wheezing or rales.  Musculoskeletal:        General: No edema.     Cervical back: Neck supple.     Right lower leg: No edema.     Left lower leg: No edema.  Neurological:     Mental Status: He is alert and oriented to person, place, and time.     Comments: No focal weakness. Romberg normal.     BP 118/80   Pulse 60   Ht 6\' 4"  (1.93 m)   Wt 191 lb (86.6 kg)   SpO2 97%   BMI 23.25 kg/m  Wt Readings from Last 3 Encounters:  05/03/20 191 lb (86.6 kg)  04/19/20 193 lb 11.2 oz (87.9 kg)  04/12/20 195 lb (88.5 kg)     There are no preventive care reminders to display for this patient.  There are no preventive care reminders to display for this patient.  Lab Results  Component Value Date   TSH 1.59 10/30/2018   Lab Results  Component Value Date   WBC 5.1 04/04/2020   HGB 14.5 04/04/2020   HCT 43.0 04/04/2020   MCV 95.6 04/04/2020   PLT 189 04/04/2020   Lab  Results  Component Value Date   NA 138 04/04/2020   K 4.0 04/04/2020   CO2 30 04/04/2020   GLUCOSE 97 04/04/2020   BUN 18 04/04/2020   CREATININE 1.08 04/04/2020   BILITOT 1.0 04/04/2020   ALKPHOS 58 04/04/2020   AST 25 04/04/2020   ALT 17 04/04/2020   PROT 7.0 04/04/2020   ALBUMIN 3.8 04/04/2020   CALCIUM 9.2 04/04/2020   ANIONGAP 5 04/04/2020   GFR 71.82 10/30/2018   Lab Results  Component Value Date   CHOL 161 10/30/2018   Lab Results  Component Value Date   HDL 38.00 (L) 10/30/2018   Lab Results  Component Value Date   LDLCALC 101 (H) 10/30/2018   Lab Results  Component Value Date   TRIG 108.0 10/30/2018   Lab Results  Component Value Date   CHOLHDL 4 10/30/2018   Lab Results  Component Value Date   HGBA1C 6.0 10/18/2008      Assessment & Plan:   #1 hypertension stable and at goal -Continue current blood pressure medications. Recent basic metabolic panel that was normal.   #2 history of hyperlipidemia treated with Lipitor. Recent lipids stable.  #3 past history of melanoma left side of neck. He is followed regularly by dermatology and is encouraged to follow-up with them  #4 hypothyroidism currently treated with levothyroxine. Recent TSH at goal. Continue to check TSH yearly  #5 history of chronic/persistent atrial fibrillation managed with Eliquis. Rate stable.  # 6 Health maintenance. Patient was curious regarding whether he needed any immunizations. Appears he is not had second Shingrix vaccine and he'll check at the [place] regarding that  #7 increased fall risk. We discussed fall risk prevention at home. We did discuss at some point possible occupational therapy consult and PT consult but at this point he declines.  No orders of the defined types were placed in this encounter.  Follow-up: Return in about 6 months (around 10/31/2020).    Carolann Littler, MD

## 2020-05-04 ENCOUNTER — Telehealth: Payer: Self-pay | Admitting: Oncology

## 2020-05-04 NOTE — Telephone Encounter (Signed)
Called pt per 2/10 sch msg - unable to reach pt but left message with new time

## 2020-05-29 ENCOUNTER — Telehealth: Payer: Self-pay | Admitting: Cardiology

## 2020-05-29 MED ORDER — OMEPRAZOLE 20 MG PO CPDR: 20.0000 mg | DELAYED_RELEASE_CAPSULE | Freq: Every day | ORAL | 3 refills | Status: DC

## 2020-05-29 NOTE — Addendum Note (Signed)
Addended by: Jacqulynn Cadet on: 05/29/2020 06:25 PM   Modules accepted: Orders

## 2020-05-29 NOTE — Telephone Encounter (Signed)
*  STAT* If patient is at the pharmacy, call can be transferred to refill team.   1. Which medications need to be refilled? (please list name of each medication and dose if known)  omeprazole (PRILOSEC) 20 MG capsule  2. Which pharmacy/location (including street and city if local pharmacy) is medication to be sent to? [facility] [address] [facility] Portal to Registered [facility]  3. Do they need a 30 day or 90 day supply? 90 day

## 2020-05-29 NOTE — Telephone Encounter (Signed)
Left voice message for patient Mr. Daniel Everett about his refill request going to the [facility]

## 2020-06-01 DIAGNOSIS — R69 Illness, unspecified: Secondary | ICD-10-CM | POA: Diagnosis not present

## 2020-06-01 DIAGNOSIS — D6869 Other thrombophilia: Secondary | ICD-10-CM | POA: Diagnosis not present

## 2020-06-01 DIAGNOSIS — E039 Hypothyroidism, unspecified: Secondary | ICD-10-CM | POA: Diagnosis not present

## 2020-06-01 DIAGNOSIS — N400 Enlarged prostate without lower urinary tract symptoms: Secondary | ICD-10-CM | POA: Diagnosis not present

## 2020-06-01 DIAGNOSIS — Z008 Encounter for other general examination: Secondary | ICD-10-CM | POA: Diagnosis not present

## 2020-06-01 DIAGNOSIS — I4891 Unspecified atrial fibrillation: Secondary | ICD-10-CM | POA: Diagnosis not present

## 2020-06-01 DIAGNOSIS — K219 Gastro-esophageal reflux disease without esophagitis: Secondary | ICD-10-CM | POA: Diagnosis not present

## 2020-06-01 DIAGNOSIS — E785 Hyperlipidemia, unspecified: Secondary | ICD-10-CM | POA: Diagnosis not present

## 2020-06-01 DIAGNOSIS — I10 Essential (primary) hypertension: Secondary | ICD-10-CM | POA: Diagnosis not present

## 2020-06-01 DIAGNOSIS — M1990 Unspecified osteoarthritis, unspecified site: Secondary | ICD-10-CM | POA: Diagnosis not present

## 2020-06-01 DIAGNOSIS — H409 Unspecified glaucoma: Secondary | ICD-10-CM | POA: Diagnosis not present

## 2020-06-20 ENCOUNTER — Ambulatory Visit (INDEPENDENT_AMBULATORY_CARE_PROVIDER_SITE_OTHER): Payer: Medicare HMO

## 2020-06-20 DIAGNOSIS — I4891 Unspecified atrial fibrillation: Secondary | ICD-10-CM | POA: Diagnosis not present

## 2020-06-20 LAB — CUP PACEART REMOTE DEVICE CHECK
Battery Remaining Longevity: 143 mo
Battery Remaining Percentage: 95.5 %
Battery Voltage: 3.01 V
Brady Statistic RV Percent Paced: 92 %
Date Time Interrogation Session: 20220329020013
Implantable Lead Implant Date: 20200528
Implantable Lead Location: 753860
Implantable Pulse Generator Implant Date: 20200528
Lead Channel Impedance Value: 510 Ohm
Lead Channel Pacing Threshold Amplitude: 0.625 V
Lead Channel Pacing Threshold Pulse Width: 0.5 ms
Lead Channel Sensing Intrinsic Amplitude: 4.2 mV
Lead Channel Setting Pacing Amplitude: 0.875
Lead Channel Setting Pacing Pulse Width: 0.5 ms
Lead Channel Setting Sensing Sensitivity: 2 mV
Pulse Gen Model: 1272
Pulse Gen Serial Number: 9116714

## 2020-07-03 ENCOUNTER — Telehealth: Payer: Self-pay | Admitting: Family Medicine

## 2020-07-03 NOTE — Telephone Encounter (Signed)
Left message for patient to call back and schedule Medicare Annual Wellness Visit (AWV) either virtually or in office. No detailed information left   Last AWV 03/01/2019  please schedule at anytime with [facility] Nurse Health Advisor 1 or 2   This should be a 45 minute visit.

## 2020-07-03 NOTE — Telephone Encounter (Signed)
Documented on spreadsheet 

## 2020-07-03 NOTE — Telephone Encounter (Signed)
Pt call and stated he don't want a Awv he it with insurance .

## 2020-07-03 NOTE — Telephone Encounter (Signed)
Pt call and stated he don't want a Awv he stated he had one with this Haematologist .

## 2020-07-04 ENCOUNTER — Telehealth: Payer: Self-pay | Admitting: Family Medicine

## 2020-07-04 ENCOUNTER — Telehealth: Payer: Self-pay | Admitting: Neurology

## 2020-07-04 DIAGNOSIS — M159 Polyosteoarthritis, unspecified: Secondary | ICD-10-CM

## 2020-07-04 MED ORDER — ALPRAZOLAM 0.25 MG PO TABS: 0.2500 mg | ORAL_TABLET | Freq: Three times a day (TID) | ORAL | 1 refills | Status: DC

## 2020-07-04 NOTE — Telephone Encounter (Signed)
The patient would like a referral to Gavin Pound, [facility] [address] [place], [place], [place] [address] [phone]

## 2020-07-04 NOTE — Telephone Encounter (Signed)
I called the patient.  The patient indicates that the clonazepam is not working and he believes that the tremor has worsened on the medication.  I will stop the clonazepam, try low-dose alprazolam, the patient cannot go on primidone as he is on Eliquis.

## 2020-07-04 NOTE — Telephone Encounter (Signed)
Pt states clonazePAM (KLONOPIN) 0.5 MG tablet is not working for him even after the dosage increase. Pt is wanting to be advised on what else can he do or take. Please advise. Pt states he is available between the hours oc 2pm-5pm

## 2020-07-04 NOTE — Progress Notes (Signed)
Remote pacemaker transmission.   

## 2020-07-05 ENCOUNTER — Other Ambulatory Visit: Payer: Self-pay | Admitting: Family Medicine

## 2020-07-05 NOTE — Telephone Encounter (Signed)
We can refer to rheumatology but generally not sure how much they can add to sports medicine management for osteoarthritis type pains.  We use them typically more for inflammatory condition such as rheumatoid arthritis and autoimmune related arthritis

## 2020-07-05 NOTE — Telephone Encounter (Signed)
Left message for patient to call back  

## 2020-07-05 NOTE — Telephone Encounter (Signed)
Spoke with the patient. She stated he is having extreme joint pain. [facility] for referral?

## 2020-07-05 NOTE — Telephone Encounter (Signed)
Okay to refer but we need to know reason.  I assume this is related to his arthritis.  He has known osteoarthritis

## 2020-07-06 NOTE — Telephone Encounter (Signed)
Spoke with patient, he is okay being referred to sports medication or rheumatology. He stated that he just wants to know what type of arthritis he has and how to treat it, and he said that it getting worse.

## 2020-07-09 NOTE — Telephone Encounter (Signed)
I really think he might be better served to see sports medicine at this time since no hx of inflammatory arthritis.

## 2020-07-10 NOTE — Addendum Note (Signed)
Addended by: Rebecca Eaton on: 07/10/2020 02:01 PM   Modules accepted: Orders

## 2020-07-10 NOTE — Telephone Encounter (Signed)
Referral has beemn placed. Spoke with the patient. He was informed of Dr. Anastasio Auerbach message below and agreed to the sports Medicine referral. Nothing further needed at this time.

## 2020-07-12 NOTE — Progress Notes (Signed)
Note duplication 

## 2020-07-13 ENCOUNTER — Encounter: Payer: Self-pay | Admitting: Family Medicine

## 2020-07-13 ENCOUNTER — Ambulatory Visit (INDEPENDENT_AMBULATORY_CARE_PROVIDER_SITE_OTHER): Payer: Medicare HMO

## 2020-07-13 ENCOUNTER — Other Ambulatory Visit: Payer: Self-pay

## 2020-07-13 ENCOUNTER — Ambulatory Visit: Payer: Self-pay

## 2020-07-13 ENCOUNTER — Ambulatory Visit: Payer: Medicare HMO | Admitting: Family Medicine

## 2020-07-13 VITALS — BP 112/80 | HR 62 | Ht 76.0 in | Wt 185.4 lb

## 2020-07-13 DIAGNOSIS — M79641 Pain in right hand: Secondary | ICD-10-CM | POA: Diagnosis not present

## 2020-07-13 DIAGNOSIS — M25531 Pain in right wrist: Secondary | ICD-10-CM | POA: Diagnosis not present

## 2020-07-13 DIAGNOSIS — M79642 Pain in left hand: Secondary | ICD-10-CM | POA: Diagnosis not present

## 2020-07-13 DIAGNOSIS — M1711 Unilateral primary osteoarthritis, right knee: Secondary | ICD-10-CM

## 2020-07-13 DIAGNOSIS — M1812 Unilateral primary osteoarthritis of first carpometacarpal joint, left hand: Secondary | ICD-10-CM | POA: Diagnosis not present

## 2020-07-13 DIAGNOSIS — M1811 Unilateral primary osteoarthritis of first carpometacarpal joint, right hand: Secondary | ICD-10-CM | POA: Diagnosis not present

## 2020-07-13 LAB — CK: Total CK: 92 U/L (ref 7–232)

## 2020-07-13 LAB — URIC ACID: Uric Acid, Serum: 5.2 mg/dL (ref 4.0–7.8)

## 2020-07-13 LAB — SEDIMENTATION RATE: Sed Rate: 11 mm/hr (ref 0–20)

## 2020-07-13 NOTE — Patient Instructions (Signed)
Thank you for coming in today.  Please get an Xray today before you leave  Please get labs today before you leave  Please use voltaren gel up to 4x daily for pain as needed.   Use heat.   Recheck in 2 weeks.

## 2020-07-13 NOTE — Progress Notes (Signed)
I, Daniel Everett, LAT, ATC, am serving as scribe for Dr. Lynne Everett.  Daniel Everett is a 87 y.o. male who presents to [place] at [facility] today for multiple areas of joint pain.    He has several concerns today.  He has bilateral hand pain and stiffness and swelling.  He is concerned that he may have a rheumatologic problem and would like an evaluation of possible.  He denies any acute injury to his hands and wrist but notes persistent pain and dysfunction notes limits his ability to take care of himself and do things at home such as taking his garbage out.  Additionally he notes an acute right upper back pain starting this morning.  This woke him from sleep and is slowly improving.  He denies any radiating pain weakness or numbness distally.  Patient has persistent right knee pain.  He has been told that he has severe knee arthritis and needs a knee replacement.  He has had repeated injections that do not work very well.   Diagnostic imaging: R knee XR- 10/02/17  Pertinent review of systems: No fevers or chills  Relevant historical information: History of prostate cancer   Exam:  BP 112/80 (BP Location: Left Arm, Patient Position: Sitting, Cuff Size: Normal)   Pulse 62   Ht 6\' 4"  (1.93 m)   Wt 185 lb 6.4 oz (84.1 kg)   SpO2 95%   BMI 22.57 kg/m  General: Well Developed, well nourished, and in no acute distress.   MSK:  Right wrist mild effusion Hands bilaterally mild swelling at MCPs.  DIPs and PIPs also somewhat swollen. Decreased hand and wrist motion. Tender palpation.  C-spine normal-appearing nontender midline. T-spine nontender midline. Tender palpation right thoracic paraspinal musculature.    Lab and Radiology Results  X-ray images bilateral hands obtained today personally and independently interpreted  Right hand: Significant osteoarthritis degenerative changes of the hand and wrist.  No erosive changes suggestive of gout or  rheumatoid arthritis.  Left hand: Significant osteoarthritis degenerative changes of the hand and wrist.  No erosive changes suggestive of gout or rheumatoid arthritis.  Await formal radiology review    Assessment and Plan: 87 y.o. male with bilateral hand pain and swelling concerning for osteoarthritis.  However we will can proceed with rheumatologic work-up.  X-rays as above however formal radiology review is still pending.  We will proceed with lab work-up as below as well.  Limited treatment with Voltaren gel and heat.  Patient prefers to wait until we have a better sense of what the problem is to treat which is reasonable.  Likely most helpful treatment will be hand therapy with some limited injections.  As for his periscapular pain muscle spasm is most likely.  Physical therapy would be the most helpful treatment he would like to wait on this.  He will let me know when he wants referral.  Recheck 2 weeks.   PDMP not reviewed this encounter. Orders Placed This Encounter  Procedures  . DG Hand Complete Right    Standing Status:   Future    Number of Occurrences:   1    Standing Expiration Date:   07/13/2021    Order Specific Question:   Reason for Exam (SYMPTOM  OR DIAGNOSIS REQUIRED)    Answer:   eval hand pain    Order Specific Question:   Preferred imaging location?    Answer:   [facility]  . DG Hand Complete Left  Standing Status:   Future    Number of Occurrences:   1    Standing Expiration Date:   07/13/2021    Order Specific Question:   Reason for Exam (SYMPTOM  OR DIAGNOSIS REQUIRED)    Answer:   eval hand pain    Order Specific Question:   Preferred imaging location?    Answer:   [facility]  . CK    Standing Status:   Future    Number of Occurrences:   1    Standing Expiration Date:   07/13/2021  . ANA    Standing Status:   Future    Number of Occurrences:   1    Standing Expiration Date:   07/13/2021  . Rheumatoid factor    Standing Status:    Future    Number of Occurrences:   1    Standing Expiration Date:   07/13/2021  . Sedimentation rate    Standing Status:   Future    Number of Occurrences:   1    Standing Expiration Date:   07/13/2021  . Cyclic citrul peptide antibody, IgG    Standing Status:   Future    Number of Occurrences:   1    Standing Expiration Date:   07/13/2021  . Uric acid    Standing Status:   Future    Number of Occurrences:   1    Standing Expiration Date:   07/13/2021   No orders of the defined types were placed in this encounter.    Discussed warning signs or symptoms. Please see discharge instructions. Patient expresses understanding.   The above documentation has been reviewed and is accurate and complete Daniel Everett, M.D.

## 2020-07-14 LAB — ANA: Anti Nuclear Antibody (ANA): NEGATIVE

## 2020-07-14 LAB — RHEUMATOID FACTOR: Rheumatoid fact SerPl-aCnc: <14 IU/mL (ref <14)

## 2020-07-14 LAB — CYCLIC CITRUL PEPTIDE ANTIBODY, IGG: Cyclic Citrullin Peptide Ab: <16 UNITS

## 2020-07-14 NOTE — Progress Notes (Signed)
Early rheumatologic labs are negative.  Other labs are still pending

## 2020-07-17 NOTE — Progress Notes (Signed)
Further rheumatology labs are negative.

## 2020-07-17 NOTE — Progress Notes (Signed)
Left hand shows regular wear-and-tear osteoarthritis

## 2020-07-17 NOTE — Progress Notes (Signed)
Right hand shows regular wear-and-tear osteoarthritis

## 2020-07-26 ENCOUNTER — Ambulatory Visit: Payer: Medicare HMO | Admitting: Family Medicine

## 2020-07-26 ENCOUNTER — Other Ambulatory Visit: Payer: Self-pay

## 2020-07-26 ENCOUNTER — Encounter: Payer: Self-pay | Admitting: Family Medicine

## 2020-07-26 VITALS — BP 100/62 | HR 67 | Ht 76.0 in | Wt 185.0 lb

## 2020-07-26 DIAGNOSIS — M79642 Pain in left hand: Secondary | ICD-10-CM

## 2020-07-26 DIAGNOSIS — M25531 Pain in right wrist: Secondary | ICD-10-CM

## 2020-07-26 DIAGNOSIS — M79641 Pain in right hand: Secondary | ICD-10-CM | POA: Diagnosis not present

## 2020-07-26 NOTE — Patient Instructions (Addendum)
Thank you for coming in today.  I think hand therapy would help a lot.   Continue voltaren gel.   Try a heating pad.   Plan for physical therapy.

## 2020-07-26 NOTE — Progress Notes (Signed)
Rito Ehrlich, am serving as a Education administrator for Dr. Lynne Leader.  Daniel Everett is a 87 y.o. male who presents to [facility] today for f/u bilat hand pain/stiffness/swelling. Pt was last seen by Dr. Georgina Snell on 07/13/20 and was advised to use Voltaren gel and heat. Pt was also advised that hand PT may be helpful, but upon speaking w/ the pt to convey XR results, he was uninterested in the referral. Today, pt reports that he is still having the same pain and wanting to know the next steps.   Dx testing: 07/13/20 Labs (CK, ANA, rheumatoid factor, sed rate, Cyclic Citrullin Peptide Ab, & uric acid)  07/13/20 R & L hand XR  Pertinent review of systems: No fevers or chills  Relevant historical information: Hypertension   Exam:  BP 100/62 (BP Location: Left Arm, Patient Position: Sitting, Cuff Size: Normal)   Pulse 67   Ht 6\' 4"  (1.93 m)   Wt 185 lb (83.9 kg)   SpO2 90%   BMI 22.52 kg/m  General: Well Developed, well nourished, and in no acute distress.   MSK: Hands bilaterally swelling slight reduction in range of motion.  Intact strength.    Lab and Radiology Results DG Hand Complete Left  Result Date: 07/14/2020 CLINICAL DATA:  Bilateral hand pain, for 3 months, history of multiple falls, no acute injury reported EXAM: LEFT HAND - COMPLETE 3+ VIEW COMPARISON:  None. FINDINGS: No fracture or dislocation. Mild first carpometacarpal joint osteoarthritis. Mild first MCP joint osteoarthritis. Mild third D IP joint osteoarthritis. No focal osseous lesions. No radiopaque foreign bodies. IMPRESSION: Mild polyarticular osteoarthritis, most prominent at the first carpometacarpal joint. Electronically Signed   By: Ilona Sorrel M.D.   On: 07/14/2020 13:14   DG Hand Complete Right  Result Date: 07/14/2020 CLINICAL DATA:  Hand pain.  Multiple falls. EXAM: RIGHT HAND - COMPLETE 3+ VIEW COMPARISON:  None. FINDINGS: Mild diffuse osteopenia. No acute fracture or  dislocation identified. Mild joint space narrowing with marginal spur formation involving the D IP joints. Narrowing of the radiocarpal joint is identified with subchondral sclerosis. Soft tissues are unremarkable. IMPRESSION: 1. No acute findings. 2. Osteopenia. 3. Osteoarthritis. Electronically Signed   By: Kerby Moors M.D.   On: 07/14/2020 13:13   CUP PACEART REMOTE DEVICE CHECK  Result Date: 06/20/2020 Scheduled remote reviewed. Normal device function.  Next remote 91 days- JBox, RN/CVRS I, Lynne Leader, personally (independently) visualized and performed the interpretation of the xray images attached in this note.   Recent Results (from the past 2160 hour(s))  CUP PACEART REMOTE DEVICE CHECK     Status: None   Collection Time: 06/20/20  2:00 AM  Result Value Ref Range   Date Time Interrogation Session [id number]    Pulse Generator Manufacturer [employer]    Pulse Gen Model 1272 Assurity MRI    Pulse Gen Serial Number [id number]    Clinic Name [facility] Pulse Generator Type Implantable Pulse Generator    Implantable Pulse Generator Implant Date [id number]    Implantable Lead Manufacturer [facility]    Implantable Lead Model LPA1200M Tendril MRI    Implantable Lead Serial Number [id number]    Implantable Lead Implant Date [id number]    Implantable Lead Location Detail 1 UNKNOWN    Implantable Lead Location [id number]    Lead Channel Setting Sensing Sensitivity 2.0 mV   Lead Channel Setting Sensing Adaptation Mode Fixed Pacing    Lead Channel Setting  Pacing Pulse Width 0.5 ms   Lead Channel Setting Pacing Amplitude 0.875    Lead Channel Status NULL    Lead Channel Impedance Value 510 ohm   Lead Channel Sensing Intrinsic Amplitude 4.2 mV   Lead Channel Pacing Threshold Amplitude 0.625 V   Lead Channel Pacing Threshold Pulse Width 0.5 ms   Battery Status MOS    Battery Remaining Longevity 143 mo   Battery Remaining Percentage 95.5 %   Battery Voltage 3.01 V   Brady Statistic RV  Percent Paced 92.0 %  Uric acid     Status: None   Collection Time: 07/13/20  2:34 PM  Result Value Ref Range   Uric Acid, Serum 5.2 4.0 - 7.8 mg/dL  Cyclic citrul peptide antibody, IgG     Status: None   Collection Time: 07/13/20  2:34 PM  Result Value Ref Range   Cyclic Citrullin Peptide Ab <16 UNITS    Comment: Reference Range Negative:            <20 Weak Positive:       20-39 Moderate Positive:   40-59 Strong Positive:     >59 .   Sedimentation rate     Status: None   Collection Time: 07/13/20  2:34 PM  Result Value Ref Range   Sed Rate 11 0 - 20 mm/hr  Rheumatoid factor     Status: None   Collection Time: 07/13/20  2:34 PM  Result Value Ref Range   Rhuematoid fact SerPl-aCnc <14 <14 IU/mL  ANA     Status: None   Collection Time: 07/13/20  2:34 PM  Result Value Ref Range   Anti Nuclear Antibody (ANA) NEGATIVE NEGATIVE    Comment: ANA IFA is a first line screen for detecting the presence of up to approximately 150 autoantibodies in various autoimmune diseases. A negative ANA IFA result suggests an ANA-associated autoimmune disease is not present at this time, but is not definitive. If there is high clinical suspicion for Sjogren's syndrome, testing for anti-SS-A/Ro antibody should be considered. Anti-Jo-1 antibody should be considered for clinically suspected inflammatory myopathies. . AC-0: Negative . International Consensus on ANA Patterns (https://www.hernandez-brewer.com/) . For additional information, please refer to http://education.[employer].com/faq/FAQ177 (This link is being provided for informational/ educational purposes only.) .   CK     Status: None   Collection Time: 07/13/20  2:34 PM  Result Value Ref Range   Total CK 92 7 - 232 U/L        Assessment and Plan: 87 y.o. male with bilateral hand and wrist pain due to osteoarthritis.  Patient had x-rays and a rheumatologic work-up last visit that support osteoarthritis.  He has  already tried Voltaren gel and conservative management at home.  Discussed treatment plan and options.  The obvious neck step for treatment for this condition would be to hand physical therapy/Occupational Therapy.  Daniel Everett is resistant to this idea but is also resistant to the idea of more aggressive treatment options including injection or potential surgical options.  After lots of discussion of the potential treatment options Daniel Everett agrees for limited trial of hand physical therapy.  Marland Kitchen  PDMP not reviewed this encounter. Orders Placed This Encounter  Procedures  . Ambulatory referral to Physical Therapy    Referral Priority:   Routine    Referral Type:   Physical Medicine    Referral Reason:   Specialty Services Required    Requested Specialty:   Physical Therapy    Number of Visits Requested:  1   No orders of the defined types were placed in this encounter.    Discussed warning signs or symptoms. Please see discharge instructions. Patient expresses understanding.   The above documentation has been reviewed and is accurate and complete Lynne Leader, M.D.  Total encounter time 20 minutes including face-to-face time with the patient and, reviewing past medical record, and charting on the date of service.

## 2020-07-27 ENCOUNTER — Ambulatory Visit: Payer: Medicare HMO | Admitting: Family Medicine

## 2020-07-27 DIAGNOSIS — L819 Disorder of pigmentation, unspecified: Secondary | ICD-10-CM | POA: Diagnosis not present

## 2020-07-27 DIAGNOSIS — Z85820 Personal history of malignant melanoma of skin: Secondary | ICD-10-CM | POA: Diagnosis not present

## 2020-07-27 DIAGNOSIS — Z85828 Personal history of other malignant neoplasm of skin: Secondary | ICD-10-CM | POA: Diagnosis not present

## 2020-07-27 DIAGNOSIS — L821 Other seborrheic keratosis: Secondary | ICD-10-CM | POA: Diagnosis not present

## 2020-07-27 DIAGNOSIS — D1801 Hemangioma of skin and subcutaneous tissue: Secondary | ICD-10-CM | POA: Diagnosis not present

## 2020-07-27 DIAGNOSIS — I8393 Asymptomatic varicose veins of bilateral lower extremities: Secondary | ICD-10-CM | POA: Diagnosis not present

## 2020-07-27 DIAGNOSIS — L814 Other melanin hyperpigmentation: Secondary | ICD-10-CM | POA: Diagnosis not present

## 2020-07-27 DIAGNOSIS — L570 Actinic keratosis: Secondary | ICD-10-CM | POA: Diagnosis not present

## 2020-07-27 DIAGNOSIS — L905 Scar conditions and fibrosis of skin: Secondary | ICD-10-CM | POA: Diagnosis not present

## 2020-08-02 DIAGNOSIS — M25641 Stiffness of right hand, not elsewhere classified: Secondary | ICD-10-CM | POA: Diagnosis not present

## 2020-08-02 DIAGNOSIS — M6281 Muscle weakness (generalized): Secondary | ICD-10-CM | POA: Diagnosis not present

## 2020-08-02 DIAGNOSIS — M19241 Secondary osteoarthritis, right hand: Secondary | ICD-10-CM | POA: Diagnosis not present

## 2020-08-02 DIAGNOSIS — M19242 Secondary osteoarthritis, left hand: Secondary | ICD-10-CM | POA: Diagnosis not present

## 2020-08-08 ENCOUNTER — Telehealth: Payer: Self-pay | Admitting: Family Medicine

## 2020-08-08 NOTE — Telephone Encounter (Signed)
Spoke with the patient. He is aware of when is last flu shot was. He then asked how often he needed one and when he was due again. I explained to the patient that he should get his flu shot once a year and that he is not due for another one until November. Patient expressed understanding. Nothing further needed.

## 2020-08-08 NOTE — Telephone Encounter (Signed)
Patient would like to know when he had his last flu shot  Please advise  CB [phone]

## 2020-08-08 NOTE — Telephone Encounter (Signed)
The patients last flu shot was on 02/10/2020.

## 2020-08-08 NOTE — Telephone Encounter (Signed)
Left message for patient to call back  

## 2020-08-09 DIAGNOSIS — M19242 Secondary osteoarthritis, left hand: Secondary | ICD-10-CM | POA: Diagnosis not present

## 2020-08-09 DIAGNOSIS — M6281 Muscle weakness (generalized): Secondary | ICD-10-CM | POA: Diagnosis not present

## 2020-08-09 DIAGNOSIS — M25641 Stiffness of right hand, not elsewhere classified: Secondary | ICD-10-CM | POA: Diagnosis not present

## 2020-08-09 DIAGNOSIS — M19241 Secondary osteoarthritis, right hand: Secondary | ICD-10-CM | POA: Diagnosis not present

## 2020-08-15 ENCOUNTER — Telehealth: Payer: Self-pay | Admitting: Family Medicine

## 2020-08-15 DIAGNOSIS — M79642 Pain in left hand: Secondary | ICD-10-CM

## 2020-08-15 DIAGNOSIS — M79641 Pain in right hand: Secondary | ICD-10-CM

## 2020-08-15 NOTE — Telephone Encounter (Signed)
Patient called stating that he went to PT at [facility] but they said that they would not be able to help him with the arthritis but only help to manage the pain. That is not what he is looking for.  He asked if he could be referred to Dr Gavin Pound at [facility] ([address] phone: [phone] fax: [phone]).

## 2020-08-16 NOTE — Telephone Encounter (Signed)
Yes I will refer you to rheumatology for second opinion.  Of note.  Please send x-ray report of hands, and recent rheumatologic labs with referral.

## 2020-08-31 ENCOUNTER — Other Ambulatory Visit: Payer: Self-pay | Admitting: Cardiology

## 2020-09-01 ENCOUNTER — Other Ambulatory Visit: Payer: Self-pay | Admitting: Neurology

## 2020-09-04 ENCOUNTER — Other Ambulatory Visit: Payer: Self-pay | Admitting: Neurology

## 2020-09-04 ENCOUNTER — Telehealth: Payer: Self-pay | Admitting: Neurology

## 2020-09-04 MED ORDER — ALPRAZOLAM 0.25 MG PO TABS: 0.2500 mg | ORAL_TABLET | Freq: Three times a day (TID) | ORAL | 2 refills | Status: DC

## 2020-09-04 NOTE — Telephone Encounter (Signed)
The alprazolam will be refilled.

## 2020-09-04 NOTE — Telephone Encounter (Signed)
Pt called stating that the pharmacy has been reaching out to the office for the pt's refill for ALPRAZolam (XANAX) 0.25 MG tablet Pt states that he has one more day left and is needing this filled at the [facility] the [facility] on [address]. Please advise.

## 2020-09-08 DIAGNOSIS — H401131 Primary open-angle glaucoma, bilateral, mild stage: Secondary | ICD-10-CM | POA: Diagnosis not present

## 2020-09-18 ENCOUNTER — Other Ambulatory Visit: Payer: Self-pay | Admitting: Family Medicine

## 2020-09-19 ENCOUNTER — Ambulatory Visit (INDEPENDENT_AMBULATORY_CARE_PROVIDER_SITE_OTHER): Payer: Medicare HMO

## 2020-09-19 DIAGNOSIS — R55 Syncope and collapse: Secondary | ICD-10-CM

## 2020-09-19 LAB — CUP PACEART REMOTE DEVICE CHECK
Battery Remaining Longevity: 117 mo
Battery Remaining Percentage: 87 %
Battery Voltage: 3.01 V
Brady Statistic RV Percent Paced: 93 %
Date Time Interrogation Session: 20220628020016
Implantable Lead Implant Date: 20200528
Implantable Lead Location: 753860
Implantable Pulse Generator Implant Date: 20200528
Lead Channel Impedance Value: 530 Ohm
Lead Channel Pacing Threshold Amplitude: 0.5 V
Lead Channel Pacing Threshold Pulse Width: 0.5 ms
Lead Channel Sensing Intrinsic Amplitude: 3.9 mV
Lead Channel Setting Pacing Amplitude: 0.75 V
Lead Channel Setting Pacing Pulse Width: 0.5 ms
Lead Channel Setting Sensing Sensitivity: 2 mV
Pulse Gen Model: 1272
Pulse Gen Serial Number: 9116714

## 2020-09-20 DIAGNOSIS — R5382 Chronic fatigue, unspecified: Secondary | ICD-10-CM | POA: Diagnosis not present

## 2020-09-20 DIAGNOSIS — M79641 Pain in right hand: Secondary | ICD-10-CM | POA: Diagnosis not present

## 2020-09-20 DIAGNOSIS — M79642 Pain in left hand: Secondary | ICD-10-CM | POA: Diagnosis not present

## 2020-09-20 DIAGNOSIS — Z6822 Body mass index (BMI) 22.0-22.9, adult: Secondary | ICD-10-CM | POA: Diagnosis not present

## 2020-10-04 DIAGNOSIS — R5382 Chronic fatigue, unspecified: Secondary | ICD-10-CM | POA: Diagnosis not present

## 2020-10-04 DIAGNOSIS — Z6822 Body mass index (BMI) 22.0-22.9, adult: Secondary | ICD-10-CM | POA: Diagnosis not present

## 2020-10-04 DIAGNOSIS — M79641 Pain in right hand: Secondary | ICD-10-CM | POA: Diagnosis not present

## 2020-10-04 DIAGNOSIS — M150 Primary generalized (osteo)arthritis: Secondary | ICD-10-CM | POA: Diagnosis not present

## 2020-10-04 DIAGNOSIS — M79642 Pain in left hand: Secondary | ICD-10-CM | POA: Diagnosis not present

## 2020-10-10 NOTE — Progress Notes (Signed)
Remote pacemaker transmission.   

## 2020-10-12 ENCOUNTER — Ambulatory Visit: Payer: Medicare HMO | Admitting: Neurology

## 2020-10-12 ENCOUNTER — Encounter: Payer: Self-pay | Admitting: Neurology

## 2020-10-12 VITALS — BP 96/54 | HR 59 | Ht 76.0 in | Wt 188.0 lb

## 2020-10-12 DIAGNOSIS — G250 Essential tremor: Secondary | ICD-10-CM

## 2020-10-12 NOTE — Progress Notes (Signed)
Reason for visit: Benign essential tremor  Daniel Everett is an 87 y.o. male  History of present illness:  Daniel Everett is an 87 year old right-handed white male with a history of essential tremor.  The patient has had gradual worsening of the tremors as he has gotten older.  The patient did not feel that the clonazepam was helpful, he was switched to alprazolam.  He cannot take Mysoline as he is on Eliquis.  The patient gained some benefit with the alprazolam, but he still has a lot of problems with handwriting and with buttoning buttons.  He does not have any drowsiness on the medication.  He denies any dizziness.  He does report some mild gait instability, he has not had any falls.  He recently did have the COVID virus without hardly any symptoms, he has been fully vaccinated.  He comes to this office for further evaluation.  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   OSTEOARTHRITIS 11/27/2006   Peripheral neuropathy 04/08/2018   Presence of permanent cardiac pacemaker 08/20/2018   WEIGHT LOSS, RECENT 11/27/2006    Past Surgical History:  Procedure Laterality Date   arthroscopy rt knee     EXCISION MASS NECK Left 01/29/2019   Procedure: 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;  Surgeon: [name], MD;  Location: [facility];  Service: ENT;  Laterality: Left;   HYDROCELE EXCISION / REPAIR     INSERT / REPLACE / REMOVE PACEMAKER  08/20/2018   PACEMAKER IMPLANT N/A 08/20/2018   Procedure: PACEMAKER IMPLANT;  Surgeon: Constance Haw, MD;  Location: [facility];  Service: Cardiovascular;  Laterality: N/A;    PROSTATE SURGERY     TURP   ROTATOR CUFF REPAIR      Family History  Problem Relation Age of Onset   Coronary artery disease Mother    Heart disease Mother    Pancreatic cancer Father    Cancer Father    Tremor Sister    Colon cancer Sister        2021   Esophageal cancer Neg Hx    Stomach cancer Neg Hx    Rectal cancer Neg Hx     Social history:  reports that he has never smoked. He has never used smokeless tobacco. He reports that he does not drink alcohol and does not use drugs.   No Known Allergies  Medications:  Prior to Admission medications   Medication Sig Start Date End Date Taking? Authorizing Provider  acetaminophen (TYLENOL) 650 MG CR tablet Take 650 mg by mouth 2 (two) times a day.   Yes [provider]  ALPRAZolam (XANAX) 0.25 MG tablet Take 1 tablet (0.25 mg total) by mouth 3 (three) times daily. 09/04/20  Yes Kathrynn Ducking, MD  amLODipine (NORVASC) 5 MG tablet TAKE 1 TABLET DAILY 08/31/20  Yes Martinique, Peter M, MD  apixaban (ELIQUIS) 5 MG TABS tablet Take 1 tablet (5 mg total) by mouth 2 (two) times daily. 05/15/17  Yes Martinique, Peter M, MD  atorvastatin (LIPITOR) 20 MG tablet TAKE 1 TABLET DAILY 03/07/20  Yes Martinique, Peter M, MD  Cholecalciferol (VITAMIN D3) 50 MCG (2000 UT) TABS Take 2,000 Units by mouth daily.   Yes [provider]  diclofenac Sodium (VOLTAREN) 1 % GEL Apply topically.   Yes [provider]  finasteride (PROSCAR) 5 MG tablet TAKE 1 TABLET DAILY 09/18/20  Yes Burchette, Alinda Sierras, MD  hydrochlorothiazide (MICROZIDE) 12.5 MG capsule Take 1 capsule (12.5 mg total) by mouth daily. 04/12/20  Yes Martinique, Peter M, MD  levothyroxine (SYNTHROID) 75 MCG tablet TAKE 1 TABLET DAILY 07/05/20  Yes Burchette, Alinda Sierras, MD  lisinopril (ZESTRIL) 20 MG tablet TAKE 1 TABLET DAILY 08/10/19  Yes Martinique, Peter M, MD  Multiple Vitamin (MULTIVITAMIN WITH MINERALS) TABS tablet Take 1 tablet by mouth daily. Senior Plus Multivitamin   Yes [provider]  omeprazole (PRILOSEC) 20 MG capsule Take 1 capsule (20 mg total) by mouth daily. 05/29/20  Yes Martinique, Peter M, MD  pyridOXINE (VITAMIN B-6) 100 MG tablet Take 100 mg by mouth daily.   Yes [provider]  timolol (TIMOPTIC) 0.5 % ophthalmic solution Place 1 drop into the left eye 2 (two) times daily.   Yes [provider]    ROS:  Out of a complete 14 system review of symptoms, the patient complains only of the following symptoms, and all other reviewed systems are negative.  Tremor Walking difficulty  Blood pressure (!) 96/54, pulse (!) 59, height 6\' 4"  (1.93 m), weight 188 lb (85.3 kg), SpO2 93 %.  Physical Exam  General: The patient is alert and cooperative at the time of the examination.  Skin: No significant peripheral edema is noted.   Neurologic Exam  Mental status: The patient is alert and oriented x 3 at the time of the examination. The patient has apparent normal recent and remote memory, with an apparently normal attention span and concentration ability.   Cranial nerves: Facial symmetry is present. Speech is somewhat hoarse, not aphasic. Extraocular movements are full. Visual fields are full.  Motor: The patient has good strength in all 4 extremities.  Sensory examination: Soft touch sensation is symmetric on the face, arms, and legs.  Coordination: The patient has good finger-nose-finger and heel-to-shin bilaterally.  The patient does have a slight intention tremor with finger-nose-finger.  The patient has some tremor translated into handwriting when drawing a spiral, his ability to sign his name is significant impaired.  Gait and station: The patient has a normal gait. Tandem gait is slightly unsteady.  Romberg is negative. No drift is seen.  Reflexes: Deep tendon reflexes are symmetric.   Assessment/Plan:  1.  Essential tremor  The patient will continue along with the alprazolam.  He is getting some benefit with the  medication, but he is not getting full resolution of the tremor.  He will follow-up here in about 6 months.  In the future, he can be followed through Dr. Rexene Alberts.  Jill Alexanders MD 10/12/2020 2:32 PM  [facility] [address] [place], [place] [address]  Phone [phone] Fax [phone]

## 2020-10-13 ENCOUNTER — Telehealth: Payer: Self-pay | Admitting: Oncology

## 2020-10-13 NOTE — Telephone Encounter (Signed)
Rescheduled 07/27 appointment to 08/11 per provider pal, patient has been called and notified.

## 2020-10-16 ENCOUNTER — Other Ambulatory Visit: Payer: Self-pay | Admitting: Cardiology

## 2020-10-18 ENCOUNTER — Other Ambulatory Visit: Payer: Medicare HMO

## 2020-10-18 ENCOUNTER — Ambulatory Visit: Payer: Medicare HMO | Admitting: Oncology

## 2020-10-26 ENCOUNTER — Telehealth: Payer: Self-pay | Admitting: Neurology

## 2020-10-26 MED ORDER — ALPRAZOLAM 0.5 MG PO TABS: 0.5000 mg | ORAL_TABLET | Freq: Three times a day (TID) | ORAL | 3 refills | Status: DC

## 2020-10-26 NOTE — Addendum Note (Signed)
Addended by: Kathrynn Ducking on: 10/26/2020 04:49 PM   Modules accepted: Orders

## 2020-10-26 NOTE — Telephone Encounter (Signed)
Pt has called to report he feels his tremors are worse than before he started ALPRAZolam (XANAX) 0.25 MG tablet, please call pt to discuss.

## 2020-10-26 NOTE — Telephone Encounter (Signed)
I called the patient.  He is on alprazolam taking 0.25 mg tablets, 1 tablet 3 times daily, he does not believe that this is helping his tremor.  He has not had any drowsiness or gait instability, we will try going up to the 0.5 mg tablets 3 times daily.

## 2020-10-28 ENCOUNTER — Other Ambulatory Visit: Payer: Self-pay | Admitting: Cardiology

## 2020-10-30 NOTE — Telephone Encounter (Addendum)
Pt called,for  ALPRAZolam (XANAX) 0.5 MG tablet need clarity on instructions. Understood when spoke with Dr. Jannifer Franklin ;was suppose to be 0.5 twice, 2 times a day. Do not know what to take now. Would like a call from the nurse. Please call in the afternoon 2p-4p.

## 2020-10-30 NOTE — Telephone Encounter (Signed)
I called the pt and relayed message from Dr. Jannifer Franklin per 10/26/2020 te note. Pt verbalized understanding.    I called the patient.  He is on alprazolam taking 0.25 mg tablets, 1 tablet 3 times daily, he does not believe that this is helping his tremor.  He has not had any drowsiness or gait instability, we will try going up to the 0.5 mg tablets 3 times daily.

## 2020-10-31 ENCOUNTER — Encounter: Payer: Self-pay | Admitting: Family Medicine

## 2020-10-31 ENCOUNTER — Ambulatory Visit (INDEPENDENT_AMBULATORY_CARE_PROVIDER_SITE_OTHER): Payer: Medicare HMO | Admitting: Family Medicine

## 2020-10-31 ENCOUNTER — Other Ambulatory Visit: Payer: Self-pay

## 2020-10-31 VITALS — BP 110/70 | HR 65 | Temp 97.9°F | Wt 187.0 lb

## 2020-10-31 DIAGNOSIS — I4821 Permanent atrial fibrillation: Secondary | ICD-10-CM | POA: Diagnosis not present

## 2020-10-31 DIAGNOSIS — I10 Essential (primary) hypertension: Secondary | ICD-10-CM

## 2020-10-31 DIAGNOSIS — E038 Other specified hypothyroidism: Secondary | ICD-10-CM | POA: Diagnosis not present

## 2020-10-31 DIAGNOSIS — M159 Polyosteoarthritis, unspecified: Secondary | ICD-10-CM | POA: Diagnosis not present

## 2020-10-31 NOTE — Progress Notes (Signed)
Established Patient Office Visit  Subjective:  Patient ID: Daniel Everett, male    DOB: 1931/07/08  Age: 87 y.o. MRN: [id number]  CC:  Chief Complaint  Patient presents with   Follow-up    hypertension    HPI Daniel Everett presents for here for medical follow-up.  He lives alone.  His wife passed away several years ago after lengthy illness.  He is still fairly independent.  He still mows his own yard.  He has had gradual decline.  Able to do less physically.  He has chronic problems including history of atrial fibrillation, hypertension, GERD, hypothyroidism, essential tremor, chronic peripheral neuropathy, osteoarthritis involving multiple sites.  He had some recent labs done at the [place] back in May with CBC and Chem-7 and these were unremarkable.  He remains on several medication for blood pressure including lisinopril, amlodipine, HCTZ.  Blood pressure stable.  No recent orthostatic symptoms.  He has pacemaker in place and has had no recent dizziness or chest pains.  He sees neurology for his tremor.  He has recently been increased and alprazolam 2.5 mg 3 times daily.  Does have occasional balance issues.  He has ongoing osteoarthritis issues and is frustrated with that.  Not getting much benefit from Tylenol.  He knows to avoid nonsteroidals.  Did get some temporary benefit from low-dose prednisone.  Past Medical History:  Diagnosis Date   Allergic rhinitis due to pollen 06/07/2008   Atrial fibrillation ([place])    Benign essential HTN    COLONIC POLYPS, HX OF 10/28/2006   DIVERTICULOSIS, COLON 11/27/2006   Gait abnormality 03/02/2018   GEN OSTEOARTHROSIS INVOLVING MULTIPLE SITES 01/03/2010   HYPERGLYCEMIA 10/18/2008   HYPOTHYROIDISM 11/27/2006   NEOPLASM, MALIGNANT, PROSTATE, HX OF, S/P TURP 05/28/2007   OSTEOARTHRITIS 11/27/2006   Peripheral neuropathy 04/08/2018   Presence of permanent cardiac pacemaker 08/20/2018   WEIGHT LOSS, RECENT 11/27/2006    Past Surgical History:  Procedure  Laterality Date   arthroscopy rt knee     EXCISION MASS NECK Left 01/29/2019   Procedure: 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;  Surgeon: [name], MD;  Location: [facility];  Service: ENT;  Laterality: Left;   HYDROCELE EXCISION / REPAIR     INSERT / REPLACE / REMOVE PACEMAKER  08/20/2018   PACEMAKER IMPLANT N/A 08/20/2018   Procedure: PACEMAKER IMPLANT;  Surgeon: Constance Haw, MD;  Location: [facility];  Service: Cardiovascular;  Laterality: N/A;   PROSTATE SURGERY     TURP   ROTATOR CUFF REPAIR      Family History  Problem Relation Age of Onset   Coronary artery disease Mother    Heart disease Mother    Pancreatic cancer Father    Cancer Father    Tremor Sister    Colon cancer Sister        2021   Esophageal cancer Neg Hx    Stomach cancer Neg Hx    Rectal cancer Neg Hx     Social History   Socioeconomic History   Marital status: Widowed    Spouse name: Not on file   Number of children: 3   Years of education: 4 years college   Highest education level: Bachelor's degree (e.g., BA, AB, BS)  Occupational History   Occupation: retired  Tobacco Use   Smoking status: Never   Smokeless tobacco: Never  Vaping Use   Vaping Use: Never used  Substance and Sexual Activity   Alcohol use:  No   Drug use: No   Sexual activity: Not Currently  Other Topics Concern   Not on file  Social History Narrative   Lives alone   Caffeine use: very little   Right handed       3 children    2 in [place] and one in near [place] graduate and played basketball there   Graduated in 59    Social Determinants of Health   Financial Resource Strain: Not on file  Food Insecurity: Not on file  Transportation Needs: Not on file  Physical Activity: Not on file  Stress: Not on file  Social Connections: Not on file  Intimate Partner Violence: Not on file    Outpatient Medications Prior to Visit  Medication Sig Dispense Refill    acetaminophen (TYLENOL) 650 MG CR tablet Take 650 mg by mouth 2 (two) times a day.     ALPRAZolam (XANAX) 0.5 MG tablet Take 1 tablet (0.5 mg total) by mouth 3 (three) times daily. 90 tablet 3   amLODipine (NORVASC) 5 MG tablet TAKE 1 TABLET DAILY 90 tablet 1   apixaban (ELIQUIS) 5 MG TABS tablet Take 1 tablet (5 mg total) by mouth 2 (two) times daily. 180 tablet 3   atorvastatin (LIPITOR) 20 MG tablet TAKE 1 TABLET DAILY 90 tablet 1   Cholecalciferol (VITAMIN D3) 50 MCG (2000 UT) TABS Take 2,000 Units by mouth daily.     diclofenac Sodium (VOLTAREN) 1 % GEL Apply topically.     finasteride (PROSCAR) 5 MG tablet TAKE 1 TABLET DAILY 90 tablet 1   hydrochlorothiazide (MICROZIDE) 12.5 MG capsule Take 1 capsule (12.5 mg total) by mouth daily. 90 capsule 2   levothyroxine (SYNTHROID) 75 MCG tablet TAKE 1 TABLET DAILY 90 tablet 1   lisinopril (ZESTRIL) 20 MG tablet TAKE 1 TABLET DAILY 90 tablet 1   Multiple Vitamin (MULTIVITAMIN WITH MINERALS) TABS tablet Take 1 tablet by mouth daily. Senior Plus Multivitamin     omeprazole (PRILOSEC) 20 MG capsule Take 1 capsule (20 mg total) by mouth daily. 90 capsule 3   pyridOXINE (VITAMIN B-6) 100 MG tablet Take 100 mg by mouth daily.     timolol (TIMOPTIC) 0.5 % ophthalmic solution Place 1 drop into the left eye 2 (two) times daily.     No facility-administered medications prior to visit.    No Known Allergies  ROS Review of Systems  Constitutional:  Negative for fatigue.  Eyes:  Negative for visual disturbance.  Respiratory:  Negative for cough, chest tightness and shortness of breath.   Cardiovascular:  Negative for chest pain, palpitations and leg swelling.  Endocrine: Negative for polydipsia and polyuria.  Musculoskeletal:  Positive for arthralgias.  Neurological:  Negative for dizziness, syncope, weakness, light-headedness and headaches.     Objective:    Physical Exam Constitutional:      Appearance: He is well-developed.  HENT:     Right  Ear: External ear normal.     Left Ear: External ear normal.  Eyes:     Pupils: Pupils are equal, round, and reactive to light.  Neck:     Thyroid: No thyromegaly.  Cardiovascular:     Rate and Rhythm: Normal rate and regular rhythm.  Pulmonary:     Effort: Pulmonary effort is normal. No respiratory distress.     Breath sounds: Normal breath sounds. No wheezing or rales.  Musculoskeletal:     Cervical back: Neck supple.     Comments: He has  knee-high compression hose on bilaterally  Neurological:     Mental Status: He is alert and oriented to person, place, and time.    BP 110/70 (BP Location: Left Arm, Patient Position: Sitting, Cuff Size: Normal)   Pulse 65   Temp 97.9 F (36.6 C) (Oral)   Wt 187 lb (84.8 kg)   SpO2 98%   BMI 22.76 kg/m  Wt Readings from Last 3 Encounters:  10/31/20 187 lb (84.8 kg)  10/12/20 188 lb (85.3 kg)  07/26/20 185 lb (83.9 kg)     Health Maintenance Due  Topic Date Due   Zoster Vaccines- Shingrix (2 of 2) 09/24/2019   INFLUENZA VACCINE  10/23/2020    There are no preventive care reminders to display for this patient.  Lab Results  Component Value Date   TSH 1.59 10/30/2018   Lab Results  Component Value Date   WBC 5.1 04/04/2020   HGB 14.5 04/04/2020   HCT 43.0 04/04/2020   MCV 95.6 04/04/2020   PLT 189 04/04/2020   Lab Results  Component Value Date   NA 138 04/04/2020   K 4.0 04/04/2020   CO2 30 04/04/2020   GLUCOSE 97 04/04/2020   BUN 18 04/04/2020   CREATININE 1.08 04/04/2020   BILITOT 1.0 04/04/2020   ALKPHOS 58 04/04/2020   AST 25 04/04/2020   ALT 17 04/04/2020   PROT 7.0 04/04/2020   ALBUMIN 3.8 04/04/2020   CALCIUM 9.2 04/04/2020   ANIONGAP 5 04/04/2020   GFR 71.82 10/30/2018   Lab Results  Component Value Date   CHOL 161 10/30/2018   Lab Results  Component Value Date   HDL 38.00 (L) 10/30/2018   Lab Results  Component Value Date   LDLCALC 101 (H) 10/30/2018   Lab Results  Component Value Date    TRIG 108.0 10/30/2018   Lab Results  Component Value Date   CHOLHDL 4 10/30/2018   Lab Results  Component Value Date   HGBA1C 6.0 10/18/2008      Assessment & Plan:   #1 hypertension stable and well-controlled -Continue current medications.  Patient had recent basic metabolic panel through the [place] 3 months ago and this was stable  #2 hypothyroidism on replacement.  He usually gets his labs done through the [place] and last TSH was last November and stable -Make sure he gets this checked when he goes to [place] this fall and get a copy of labs to [place]  #3 history of atrial fibrillation stable on Eliquis.  #4 osteoarthritis involving multiple joints.  Patient had questions today regarding medications.  We have highly advise avoidance of regular use of nonsteroidals and also avoid regular prednisone use because of osteoporosis risk.  He will continue Tylenol.  Consider physical therapy for any progressive symptoms or any progressive weakness   No orders of the defined types were placed in this encounter.   Follow-up: Return in about 6 months (around 05/03/2021).    Carolann Littler, MD

## 2020-11-02 ENCOUNTER — Other Ambulatory Visit: Payer: Self-pay

## 2020-11-02 ENCOUNTER — Inpatient Hospital Stay: Payer: Medicare HMO | Attending: Oncology

## 2020-11-02 ENCOUNTER — Inpatient Hospital Stay: Payer: Medicare HMO | Admitting: Oncology

## 2020-11-02 VITALS — BP 122/89 | HR 76 | Temp 98.1°F | Resp 18 | Ht 76.0 in | Wt 186.6 lb

## 2020-11-02 DIAGNOSIS — C434 Malignant melanoma of scalp and neck: Secondary | ICD-10-CM | POA: Diagnosis not present

## 2020-11-02 DIAGNOSIS — K862 Cyst of pancreas: Secondary | ICD-10-CM | POA: Diagnosis not present

## 2020-11-02 LAB — CMP (CANCER CENTER ONLY)
ALT: 18 U/L (ref 0–44)
AST: 28 U/L (ref 15–41)
Albumin: 3.8 g/dL (ref 3.5–5.0)
Alkaline Phosphatase: 50 U/L (ref 38–126)
Anion gap: 8 (ref 5–15)
BUN: 16 mg/dL (ref 8–23)
CO2: 27 mmol/L (ref 22–32)
Calcium: 9.1 mg/dL (ref 8.9–10.3)
Chloride: 101 mmol/L (ref 98–111)
Creatinine: 0.93 mg/dL (ref 0.61–1.24)
GFR, Estimated: >60 mL/min (ref >60)
Glucose, Bld: 105 mg/dL — ABNORMAL HIGH (ref 70–99)
Potassium: 3.8 mmol/L (ref 3.5–5.1)
Sodium: 136 mmol/L (ref 135–145)
Total Bilirubin: 0.9 mg/dL (ref 0.3–1.2)
Total Protein: 6.6 g/dL (ref 6.5–8.1)

## 2020-11-02 LAB — CBC WITH DIFFERENTIAL (CANCER CENTER ONLY)
Abs Immature Granulocytes: 0.01 10*3/uL (ref 0.00–0.07)
Basophils Absolute: 0 10*3/uL (ref 0.0–0.1)
Basophils Relative: 1 %
Eosinophils Absolute: 0 10*3/uL (ref 0.0–0.5)
Eosinophils Relative: 1 %
HCT: 39.8 % (ref 39.0–52.0)
Hemoglobin: 13.8 g/dL (ref 13.0–17.0)
Immature Granulocytes: 0 %
Lymphocytes Relative: 35 %
Lymphs Abs: 1.8 10*3/uL (ref 0.7–4.0)
MCH: 32.3 pg (ref 26.0–34.0)
MCHC: 34.7 g/dL (ref 30.0–36.0)
MCV: 93.2 fL (ref 80.0–100.0)
Monocytes Absolute: 0.5 10*3/uL (ref 0.1–1.0)
Monocytes Relative: 9 %
Neutro Abs: 2.8 10*3/uL (ref 1.7–7.7)
Neutrophils Relative %: 54 %
Platelet Count: 169 10*3/uL (ref 150–400)
RBC: 4.27 MIL/uL (ref 4.22–5.81)
RDW: 13.6 % (ref 11.5–15.5)
WBC Count: 5.1 10*3/uL (ref 4.0–10.5)
nRBC: 0 % (ref 0.0–0.2)

## 2020-11-02 NOTE — Progress Notes (Signed)
Hematology and Oncology Follow Up Visit  Daniel Everett [id number] 04/19/1931 87 y.o. 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  He was found to 1.2 mm depth without ulceration 1 on the left   Prior Therapy:  He is status post wide excision and sentinel lymph node sampling completed in November 2020.  Current therapy: Active surveillance.   Interim History: Daniel Everett returns today for a follow-up visit.  Since last visit, he reports no major changes in his health.  He denies any skin rashes or lesions.  He denies any hospitalizations or illnesses.  His performance status quality of life remains intact.  He denies any neurological deficits or weakness.   Medications: Unchanged on review. Current Outpatient Medications  Medication Sig Dispense Refill   acetaminophen (TYLENOL) 650 MG CR tablet Take 650 mg by mouth 2 (two) times a day.     ALPRAZolam (XANAX) 0.5 MG tablet Take 1 tablet (0.5 mg total) by mouth 3 (three) times daily. 90 tablet 3   amLODipine (NORVASC) 5 MG tablet TAKE 1 TABLET DAILY 90 tablet 1   apixaban (ELIQUIS) 5 MG TABS tablet Take 1 tablet (5 mg total) by mouth 2 (two) times daily. 180 tablet 3   atorvastatin (LIPITOR) 20 MG tablet TAKE 1 TABLET DAILY 90 tablet 1   Cholecalciferol (VITAMIN D3) 50 MCG (2000 UT) TABS Take 2,000 Units by mouth daily.     diclofenac Sodium (VOLTAREN) 1 % GEL Apply topically.     finasteride (PROSCAR) 5 MG tablet TAKE 1 TABLET DAILY 90 tablet 1   hydrochlorothiazide (MICROZIDE) 12.5 MG capsule Take 1 capsule (12.5 mg total) by mouth daily. 90 capsule 2   levothyroxine (SYNTHROID) 75 MCG tablet TAKE 1 TABLET DAILY 90 tablet 1   lisinopril (ZESTRIL) 20 MG tablet TAKE 1 TABLET DAILY 90 tablet 1   Multiple Vitamin (MULTIVITAMIN WITH MINERALS) TABS tablet Take 1 tablet by mouth daily. Senior Plus Multivitamin      omeprazole (PRILOSEC) 20 MG capsule Take 1 capsule (20 mg total) by mouth daily. 90 capsule 3   pyridOXINE (VITAMIN B-6) 100 MG tablet Take 100 mg by mouth daily.     timolol (TIMOPTIC) 0.5 % ophthalmic solution Place 1 drop into the left eye 2 (two) times daily.     No current facility-administered medications for this visit.     Allergies: No Known Allergies    Physical Exam:  Blood pressure 122/89, pulse 76, temperature 98.1 F (36.7 C), temperature source Oral, resp. rate 18, height $RemoveBe'6\' 4"'VEUHZZsjR$  (1.93 m), weight 186 lb 9.6 oz (84.6 kg), SpO2 98 %.    ECOG:  1   General appearance: Comfortable appearing without any discomfort Head: Normocephalic without any trauma Oropharynx: Mucous membranes are moist and pink without any thrush or ulcers. Eyes: Pupils are equal and round reactive to light. Lymph nodes: No cervical, supraclavicular, inguinal or axillary lymphadenopathy.   Heart:regular rate and rhythm.  S1 and S2 without leg edema. Lung: Clear without any rhonchi or wheezes.  No dullness to percussion. Abdomin: Soft, nontender, nondistended with good bowel sounds.  No hepatosplenomegaly. Musculoskeletal: No joint deformity or effusion.  Full range of motion noted. Neurological: No deficits noted on motor, sensory and deep tendon reflex exam. Skin: No petechial rash or dryness.  Appeared moist.        Lab Results: Lab Results  Component Value Date  WBC 5.1 04/04/2020   HGB 14.5 04/04/2020   HCT 43.0 04/04/2020   MCV 95.6 04/04/2020   PLT 189 04/04/2020     Chemistry      Component Value Date/Time   NA 138 04/04/2020 1350   NA 139 04/04/2016 1413   K 4.0 04/04/2020 1350   CL 103 04/04/2020 1350   CO2 30 04/04/2020 1350   BUN 18 04/04/2020 1350   BUN 16 04/04/2016 1413   CREATININE 1.08 04/04/2020 1350      Component Value Date/Time   CALCIUM 9.2 04/04/2020 1350   ALKPHOS 58 04/04/2020 1350   AST 25 04/04/2020 1350   ALT 17 04/04/2020 1350   BILITOT 1.0  04/04/2020 1350     Impression and Plan:   87 year old with:  1.  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    He is currently on active surveillance without any evidence of relapsed disease.  Risks and benefits of continued imaging studies were discussed.  I recommended obtaining imaging studies in 6 months and is pending imaging after that.  Annual follow-up would be recommended at that time.  Salvage therapy options including single agent immunotherapy, combination immunotherapy as well as BRAF targeted therapy if he harbors the appropriate mutation.   He is agreeable with this plan.  2.  Dermatology surveillance: I recommended continued dermatology surveillance at this time.  3.  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   4.  Follow-up: In 6 months for repeat evaluation and imaging studies.   30  minutes were dedicated to this encounter.  The time was spent on reviewing laboratory data, disease status update, treatment choices for the future and answering questions involved future plan of care.     Zola Button, MD 8/11/202212:43 PM

## 2020-11-21 DIAGNOSIS — R5382 Chronic fatigue, unspecified: Secondary | ICD-10-CM | POA: Diagnosis not present

## 2020-11-21 DIAGNOSIS — M79641 Pain in right hand: Secondary | ICD-10-CM | POA: Diagnosis not present

## 2020-11-21 DIAGNOSIS — M79642 Pain in left hand: Secondary | ICD-10-CM | POA: Diagnosis not present

## 2020-11-21 DIAGNOSIS — Z6822 Body mass index (BMI) 22.0-22.9, adult: Secondary | ICD-10-CM | POA: Diagnosis not present

## 2020-11-21 DIAGNOSIS — M150 Primary generalized (osteo)arthritis: Secondary | ICD-10-CM | POA: Diagnosis not present

## 2020-12-05 ENCOUNTER — Telehealth: Payer: Self-pay | Admitting: Neurology

## 2020-12-05 MED ORDER — TOPIRAMATE 25 MG PO TABS: ORAL_TABLET | ORAL | 2 refills | Status: DC

## 2020-12-05 MED ORDER — ALPRAZOLAM 0.5 MG PO TABS: 0.5000 mg | ORAL_TABLET | Freq: Two times a day (BID) | ORAL | 3 refills | Status: DC

## 2020-12-05 NOTE — Telephone Encounter (Signed)
I called the patient.  The patient indicates that the increase in alprazolam really did not help his tremors much, he feels slightly off balance.  We will cut back to 0.5 mg twice daily, I will retry Topamax which she has been on the past but never at a dose higher than 25 mg daily.  We will start at 25 mg a day for 2 weeks and then go to 25 mg twice daily of the Topamax.

## 2020-12-05 NOTE — Telephone Encounter (Signed)
Pt called and says the increase on the ALPRAZolam (XANAX) 0.5 MG tablet is not working. He says his balance has become less steady. Pt is requesting a call back the best time to call is in the afternoon.

## 2020-12-06 NOTE — Telephone Encounter (Signed)
Pt is asking for a call to discuss if he still needs to take the ALPRAZolam Duanne Moron) 0.5 MG tablet  and if so how many and how often

## 2020-12-06 NOTE — Telephone Encounter (Signed)
Contacted pt back to give clarity. Advised to cut back to 0.5 mg twice daily instead of 3 times daily in addition to topamax.  Was appreciative for the clarification.

## 2020-12-19 ENCOUNTER — Ambulatory Visit (INDEPENDENT_AMBULATORY_CARE_PROVIDER_SITE_OTHER): Payer: Medicare HMO

## 2020-12-19 DIAGNOSIS — I4891 Unspecified atrial fibrillation: Secondary | ICD-10-CM

## 2020-12-20 LAB — CUP PACEART REMOTE DEVICE CHECK
Battery Remaining Longevity: 115 mo
Battery Remaining Percentage: 85 %
Battery Voltage: 3.01 V
Brady Statistic RV Percent Paced: 93 %
Date Time Interrogation Session: 20220927020014
Implantable Lead Implant Date: 20200528
Implantable Lead Location: 753860
Implantable Pulse Generator Implant Date: 20200528
Lead Channel Impedance Value: 540 Ohm
Lead Channel Pacing Threshold Amplitude: 0.5 V
Lead Channel Pacing Threshold Pulse Width: 0.5 ms
Lead Channel Sensing Intrinsic Amplitude: 3.2 mV
Lead Channel Setting Pacing Amplitude: 0.75 V
Lead Channel Setting Pacing Pulse Width: 0.5 ms
Lead Channel Setting Sensing Sensitivity: 2 mV
Pulse Gen Model: 1272
Pulse Gen Serial Number: 9116714

## 2020-12-26 NOTE — Progress Notes (Signed)
Remote pacemaker transmission.   

## 2021-01-02 ENCOUNTER — Other Ambulatory Visit: Payer: Self-pay | Admitting: Neurology

## 2021-01-03 ENCOUNTER — Telehealth: Payer: Self-pay | Admitting: Neurology

## 2021-01-03 NOTE — Telephone Encounter (Signed)
Contacted pt back to give clarity. Advised to cut back to 0.5 mg twice daily instead of 3 times daily as advised last month. Was appreciative for the clarification.

## 2021-01-03 NOTE — Telephone Encounter (Signed)
Pt called, picked up prescription for ALPRAZolam (XANAX) 0.5 MG tablet has dosage 1 tablet 3 times a daily, but Dr. Jannifer Franklin changed to 1 tablet two times daily. Would like a call from the nurse. Pt gave permission to leave a voicemail.

## 2021-01-05 ENCOUNTER — Ambulatory Visit: Payer: Medicare HMO

## 2021-01-15 ENCOUNTER — Other Ambulatory Visit: Payer: Self-pay | Admitting: Family Medicine

## 2021-01-15 ENCOUNTER — Other Ambulatory Visit: Payer: Self-pay | Admitting: Cardiology

## 2021-01-29 DIAGNOSIS — D225 Melanocytic nevi of trunk: Secondary | ICD-10-CM | POA: Diagnosis not present

## 2021-01-29 DIAGNOSIS — Z85820 Personal history of malignant melanoma of skin: Secondary | ICD-10-CM | POA: Diagnosis not present

## 2021-01-29 DIAGNOSIS — Z08 Encounter for follow-up examination after completed treatment for malignant neoplasm: Secondary | ICD-10-CM | POA: Diagnosis not present

## 2021-01-29 DIAGNOSIS — L601 Onycholysis: Secondary | ICD-10-CM | POA: Diagnosis not present

## 2021-01-29 DIAGNOSIS — L814 Other melanin hyperpigmentation: Secondary | ICD-10-CM | POA: Diagnosis not present

## 2021-01-29 DIAGNOSIS — L570 Actinic keratosis: Secondary | ICD-10-CM | POA: Diagnosis not present

## 2021-01-29 DIAGNOSIS — L821 Other seborrheic keratosis: Secondary | ICD-10-CM | POA: Diagnosis not present

## 2021-02-06 ENCOUNTER — Ambulatory Visit: Payer: Medicare HMO | Admitting: Gastroenterology

## 2021-02-06 ENCOUNTER — Encounter: Payer: Self-pay | Admitting: Gastroenterology

## 2021-02-06 VITALS — BP 94/68 | HR 68 | Ht 74.5 in | Wt 184.5 lb

## 2021-02-06 DIAGNOSIS — K627 Radiation proctitis: Secondary | ICD-10-CM | POA: Diagnosis not present

## 2021-02-06 DIAGNOSIS — K921 Melena: Secondary | ICD-10-CM | POA: Diagnosis not present

## 2021-02-06 NOTE — Progress Notes (Signed)
    History of Present Illness: This is an 87 year old male with small amounts of bright red blood on the tissue paper with bowel movements.  He relates ongoing constipation infrequent episodes of diarrhea.  He describes hard stools and straining frequently.  Over the past few months he has noted 3 or 4 episodes of small amounts of bright red blood on the tissue paper after wiping.  Colonoscopy performed 1 year ago as below.  Colonoscopy 01/2020 - Five 5 to 8 mm polyps in the rectum, in the sigmoid colon, in the transverse colon and in the cecum, removed with a cold snare. Resected and retrieved. (tubular adenomas) - Mild diverticulosis in the left colon. - A few non-bleeding distal rectal angioectasias c/w mild radiation proctitis. - Internal hemorrhoids. - The examination was otherwise normal on direct and retroflexion views.  Current Medications, Allergies, Past Medical History, Past Surgical History, Family History and Social History were reviewed in [employer] record.   Physical Exam: General: Well developed, well nourished, elderly, no acute distress Head: Normocephalic and atraumatic Eyes: Sclerae anicteric, EOMI Ears: Normal auditory acuity Mouth: Not examined, mask on during Covid-19 pandemic Lungs: Clear throughout to auscultation Heart: Regular rate and rhythm; no murmurs, rubs or bruits Abdomen: Soft, non tender and non distended. No masses, hepatosplenomegaly or hernias noted. Normal Bowel sounds Rectal: Not done Musculoskeletal: Symmetrical with no gross deformities  Pulses:  Normal pulses noted Extremities: No clubbing, cyanosis, edema or deformities noted Neurological: Alert oriented x 4, grossly nonfocal Psychological:  Alert and cooperative. Normal mood and affect   Assessment and Recommendations:  Small-volume hematochezia due to internal hemorrhoids or radiation proctitis.  He is reassured that with his recent colonoscopy there is no need  to repeat colonoscopy at this time. Chronic constipation with infrequent diarrhea.  Constipation, straining and hard stools are likely exacerbating small-volume hematochezia secondary to #1.  Increase daily water intake to at least six 8 ounce glasses per day, increase dietary fiber and begin Colace twice daily.  If symptoms not improved at [facility] one half capful to 2 capfuls daily titrated for a soft bowel movement daily. Chronic anticoagulation with Eliquis could exacerbate bleeding from #1. Family history of colon cancer, personal history of adenomatous colon polyps.  Given his age and recent colonoscopy no future surveillance colonoscopies are planned.

## 2021-02-06 NOTE — Patient Instructions (Signed)
Please increase your fluids to 6 glasses of water daily.   Start over the counter Colace twice daily. If you do not see improvement in your symptoms then add Miralax mixing 17 grams in 8 oz of water daily.   Thank you for choosing me and [facility].  Pricilla Riffle. Dagoberto Ligas., MD., [facility] Regal

## 2021-02-20 ENCOUNTER — Other Ambulatory Visit: Payer: Self-pay | Admitting: Family Medicine

## 2021-02-20 ENCOUNTER — Other Ambulatory Visit: Payer: Self-pay | Admitting: Cardiology

## 2021-02-26 ENCOUNTER — Encounter: Payer: Self-pay | Admitting: Psychology

## 2021-02-27 ENCOUNTER — Other Ambulatory Visit: Payer: Self-pay | Admitting: Cardiology

## 2021-03-07 DIAGNOSIS — H401111 Primary open-angle glaucoma, right eye, mild stage: Secondary | ICD-10-CM | POA: Diagnosis not present

## 2021-03-07 DIAGNOSIS — Z961 Presence of intraocular lens: Secondary | ICD-10-CM | POA: Diagnosis not present

## 2021-03-07 DIAGNOSIS — D3131 Benign neoplasm of right choroid: Secondary | ICD-10-CM | POA: Diagnosis not present

## 2021-03-15 ENCOUNTER — Telehealth: Payer: Self-pay | Admitting: Family Medicine

## 2021-03-15 NOTE — Telephone Encounter (Signed)
Left message for patient to call back and schedule Medicare Annual Wellness Visit (AWV) either virtually or in office. Left  my Daniel Everett number [phone]   Last AWV 03/01/19 ; please schedule at anytime with [facility] Nurse Health Advisor 1 or 2   This should be a 45 minute visit.

## 2021-03-20 ENCOUNTER — Ambulatory Visit (INDEPENDENT_AMBULATORY_CARE_PROVIDER_SITE_OTHER): Payer: Medicare HMO

## 2021-03-20 DIAGNOSIS — I4891 Unspecified atrial fibrillation: Secondary | ICD-10-CM

## 2021-03-20 LAB — CUP PACEART REMOTE DEVICE CHECK
Battery Remaining Longevity: 116 mo
Battery Remaining Percentage: 83 %
Battery Voltage: 3.01 V
Brady Statistic RV Percent Paced: 92 %
Date Time Interrogation Session: 20221227020015
Implantable Lead Implant Date: 20200528
Implantable Lead Location: 753860
Implantable Pulse Generator Implant Date: 20200528
Lead Channel Impedance Value: 530 Ohm
Lead Channel Pacing Threshold Amplitude: 0.625 V
Lead Channel Pacing Threshold Pulse Width: 0.5 ms
Lead Channel Sensing Intrinsic Amplitude: 2.4 mV
Lead Channel Setting Pacing Amplitude: 0.875
Lead Channel Setting Pacing Pulse Width: 0.5 ms
Lead Channel Setting Sensing Sensitivity: 2 mV
Pulse Gen Model: 1272
Pulse Gen Serial Number: 9116714

## 2021-03-28 ENCOUNTER — Ambulatory Visit (INDEPENDENT_AMBULATORY_CARE_PROVIDER_SITE_OTHER): Payer: Medicare HMO

## 2021-03-28 VITALS — BP 110/70 | HR 76 | Temp 97.7°F | Ht 74.0 in | Wt 183.6 lb

## 2021-03-28 DIAGNOSIS — Z0000 Encounter for general adult medical examination without abnormal findings: Secondary | ICD-10-CM

## 2021-03-28 NOTE — Patient Instructions (Addendum)
Daniel Everett , Thank you for taking time to come for your Medicare Wellness Visit. I appreciate your ongoing commitment to your health goals. Please review the following plan we discussed and let me know if I can assist you in the future.   These are the goals we discussed:  Goals      Exercise 150 min/wk Moderate Activity     [facility] or if [facility] settles.        This is a list of the screening recommended for you and due dates:  Health Maintenance  Topic Date Due   Tetanus Vaccine  06/25/2022   Pneumonia Vaccine  Completed   Flu Shot  Completed   COVID-19 Vaccine  Completed   HPV Vaccine  Aged Out   Zoster (Shingles) Vaccine  Discontinued   Advanced directives: Yes  Conditions/risks identified: None  Next appointment: Follow up in one year for your annual wellness visit. Preventive Care 33 Years and Older, Male Preventive care refers to lifestyle choices and visits with your health care provider that can promote health and wellness. What does preventive care include? A yearly physical exam. This is also called an annual well check. Dental exams once or twice a year. Routine eye exams. Ask your health care provider how often you should have your eyes checked. Personal lifestyle choices, including: Daily care of your teeth and gums. Regular physical activity. Eating a healthy diet. Avoiding tobacco and drug use. Limiting alcohol use. Practicing safe sex. Taking low doses of aspirin every day. Taking vitamin and mineral supplements as recommended by your health care provider. What happens during an annual well check? The services and screenings done by your health care provider during your annual well check will depend on your age, overall health, lifestyle risk factors, and family history of disease. Counseling  Your health care provider may ask you questions about your: Alcohol use. Tobacco use. Drug use. Emotional well-being. Home and  relationship well-being. Sexual activity. Eating habits. History of falls. Memory and ability to understand (cognition). Work and work Statistician. Screening  You may have the following tests or measurements: Height, weight, and BMI. Blood pressure. Lipid and cholesterol levels. These may be checked every 5 years, or more frequently if you are over 7 years old. Skin check. Lung cancer screening. You may have this screening every year starting at age 57 if you have a 30-pack-year history of smoking and currently smoke or have quit within the past 15 years. Fecal occult blood test (FOBT) of the stool. You may have this test every year starting at age 33. Flexible sigmoidoscopy or colonoscopy. You may have a sigmoidoscopy every 5 years or a colonoscopy every 10 years starting at age 32. Prostate cancer screening. Recommendations will vary depending on your family history and other risks. Hepatitis C blood test. Hepatitis B blood test. Sexually transmitted disease (STD) testing. Diabetes screening. This is done by checking your blood sugar (glucose) after you have not eaten for a while (fasting). You may have this done every 1-3 years. Abdominal aortic aneurysm (AAA) screening. You may need this if you are a current or former smoker. Osteoporosis. You may be screened starting at age 75 if you are at high risk. Talk with your health care provider about your test results, treatment options, and if necessary, the need for more tests. Vaccines  Your health care provider may recommend certain vaccines, such as: Influenza vaccine. This is recommended every year. Tetanus, diphtheria,  and acellular pertussis (Tdap, Td) vaccine. You may need a Td booster every 10 years. Zoster vaccine. You may need this after age 37. Pneumococcal 13-valent conjugate (PCV13) vaccine. One dose is recommended after age 78. Pneumococcal polysaccharide (PPSV23) vaccine. One dose is recommended after age 66. Talk to your  health care provider about which screenings and vaccines you need and how often you need them. This information is not intended to replace advice given to you by your health care provider. Make sure you discuss any questions you have with your health care provider. Document Released: 04/07/2015 Document Revised: 11/29/2015 Document Reviewed: 01/10/2015 Elsevier Interactive Patient Education  2017 [place] Prevention in the Home Falls can cause injuries. They can happen to people of all ages. There are many things you can do to make your home safe and to help prevent falls. What can I do on the outside of my home? Regularly fix the edges of walkways and driveways and fix any cracks. Remove anything that might make you trip as you walk through a door, such as a raised step or threshold. Trim any bushes or trees on the path to your home. Use bright outdoor lighting. Clear any walking paths of anything that might make someone trip, such as rocks or tools. Regularly check to see if handrails are loose or broken. Make sure that both sides of any steps have handrails. Any raised decks and porches should have guardrails on the edges. Have any leaves, snow, or ice cleared regularly. Use sand or salt on walking paths during winter. Clean up any spills in your garage right away. This includes oil or grease spills. What can I do in the bathroom? Use night lights. Install grab bars by the toilet and in the tub and shower. Do not use towel bars as grab bars. Use non-skid mats or decals in the tub or shower. If you need to sit down in the shower, use a plastic, non-slip stool. Keep the floor dry. Clean up any water that spills on the floor as soon as it happens. Remove soap buildup in the tub or shower regularly. Attach bath mats securely with double-sided non-slip rug tape. Do not have throw rugs and other things on the floor that can make you trip. What can I do in the bedroom? Use night  lights. Make sure that you have a light by your bed that is easy to reach. Do not use any sheets or blankets that are too big for your bed. They should not hang down onto the floor. Have a firm chair that has side arms. You can use this for support while you get dressed. Do not have throw rugs and other things on the floor that can make you trip. What can I do in the kitchen? Clean up any spills right away. Avoid walking on wet floors. Keep items that you use a lot in easy-to-reach places. If you need to reach something above you, use a strong step stool that has a grab bar. Keep electrical cords out of the way. Do not use floor polish or wax that makes floors slippery. If you must use wax, use non-skid floor wax. Do not have throw rugs and other things on the floor that can make you trip. What can I do with my stairs? Do not leave any items on the stairs. Make sure that there are handrails on both sides of the stairs and use them. Fix handrails that are broken or loose. Make sure  that handrails are as long as the stairways. Check any carpeting to make sure that it is firmly attached to the stairs. Fix any carpet that is loose or worn. Avoid having throw rugs at the top or bottom of the stairs. If you do have throw rugs, attach them to the floor with carpet tape. Make sure that you have a light switch at the top of the stairs and the bottom of the stairs. If you do not have them, ask someone to add them for you. What else can I do to help prevent falls? Wear shoes that: Do not have high heels. Have rubber bottoms. Are comfortable and fit you well. Are closed at the toe. Do not wear sandals. If you use a stepladder: Make sure that it is fully opened. Do not climb a closed stepladder. Make sure that both sides of the stepladder are locked into place. Ask someone to hold it for you, if possible. Clearly mark and make sure that you can see: Any grab bars or handrails. First and last  steps. Where the edge of each step is. Use tools that help you move around (mobility aids) if they are needed. These include: Canes. Walkers. Scooters. Crutches. Turn on the lights when you go into a dark area. Replace any light bulbs as soon as they burn out. Set up your furniture so you have a clear path. Avoid moving your furniture around. If any of your floors are uneven, fix them. If there are any pets around you, be aware of where they are. Review your medicines with your doctor. Some medicines can make you feel dizzy. This can increase your chance of falling. Ask your doctor what other things that you can do to help prevent falls. This information is not intended to replace advice given to you by your health care provider. Make sure you discuss any questions you have with your health care provider. Document Released: 01/05/2009 Document Revised: 08/17/2015 Document Reviewed: 04/15/2014 Elsevier Interactive Patient Education  2017 [employer].

## 2021-03-28 NOTE — Progress Notes (Addendum)
2  Subjective:   Daniel Everett is a 88 y.o. male who presents for Medicare Annual/Subsequent preventive examination.  Review of Systems    No ROS Cardiac Risk Factors include: advanced age (>24men, >47 women);hypertension    Objective:    Today's Vitals   03/28/21 1347  BP: 110/70  Pulse: 76  Temp: 97.7 F (36.5 C)  TempSrc: Oral  Weight: 183 lb 9.6 oz (83.3 kg)  Height: 6\' 2"  (1.88 m)   Body mass index is 23.57 kg/m.  Advanced Directives 03/28/2021 10/05/2019 03/30/2019 03/01/2019 01/27/2019 08/20/2018 04/10/2018  Does Patient Have a Medical Advance Directive? Yes Yes Yes Yes No Yes No  Type of Paramedic of [place];Living will [place];Living will Healthcare Power of [place];Living will - [place];Living will -  Does patient want to make changes to medical advance directive? No - Patient declined No - Patient declined - No - Patient declined - No - Patient declined -  Copy of [place] in Chart? No - copy requested No - copy requested No - copy requested No - copy requested - No - copy requested -  Would patient like information on creating a medical advance directive? - - - - No - Patient declined - -    Current Medications (verified) Outpatient Encounter Medications as of 03/28/2021  Medication Sig   acetaminophen (TYLENOL) 650 MG CR tablet Take 650 mg by mouth 2 (two) times a day.   ALPRAZolam (XANAX) 0.5 MG tablet Take 1 tablet (0.5 mg total) by mouth 2 (two) times daily.   amLODipine (NORVASC) 5 MG tablet TAKE 1 TABLET DAILY   apixaban (ELIQUIS) 5 MG TABS tablet Take 1 tablet (5 mg total) by mouth 2 (two) times daily.   atorvastatin (LIPITOR) 20 MG tablet TAKE 1 TABLET DAILY   Cholecalciferol (VITAMIN D3) 50 MCG (2000 UT) TABS Take 2,000 Units by mouth daily.   Ciclopirox 0.77 % gel Apply 1 application topically daily as needed.   diclofenac Sodium (VOLTAREN) 1 %  GEL Apply topically.   finasteride (PROSCAR) 5 MG tablet TAKE 1 TABLET DAILY   hydrochlorothiazide (MICROZIDE) 12.5 MG capsule TAKE 1 CAPSULE DAILY   levothyroxine (SYNTHROID) 75 MCG tablet TAKE 1 TABLET DAILY   lisinopril (ZESTRIL) 20 MG tablet TAKE 1 TABLET DAILY   Multiple Vitamin (MULTIVITAMIN WITH MINERALS) TABS tablet Take 1 tablet by mouth daily. Senior Plus Multivitamin   omeprazole (PRILOSEC) 20 MG capsule Take 1 capsule (20 mg total) by mouth daily.   pyridOXINE (VITAMIN B-6) 100 MG tablet Take 100 mg by mouth daily.   timolol (TIMOPTIC) 0.5 % ophthalmic solution Place 1 drop into the left eye 2 (two) times daily.   topiramate (TOPAMAX) 25 MG tablet 1 TABLET TWICE DAILY   No facility-administered encounter medications on file as of 03/28/2021.    Allergies (verified) Patient has no known allergies.   History: Past Medical History:  Diagnosis Date   Allergic rhinitis due to pollen 06/07/2008   Atrial fibrillation ([place])    Benign essential HTN    COLONIC POLYPS, HX OF 10/28/2006   DIVERTICULOSIS, COLON 11/27/2006   Gait abnormality 03/02/2018   GEN OSTEOARTHROSIS INVOLVING MULTIPLE SITES 01/03/2010   HYPERGLYCEMIA 10/18/2008   HYPOTHYROIDISM 11/27/2006   NEOPLASM, MALIGNANT, PROSTATE, HX OF, S/P TURP 05/28/2007   OSTEOARTHRITIS 11/27/2006   Peripheral neuropathy 04/08/2018   Presence of permanent cardiac pacemaker 08/20/2018   WEIGHT LOSS, RECENT 11/27/2006  Past Surgical History:  Procedure Laterality Date   arthroscopy rt knee     EXCISION MASS NECK Left 01/29/2019   Procedure: 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;  Service: Cardiovascular;  Laterality: N/A;   PROSTATE  SURGERY     TURP   ROTATOR CUFF REPAIR     Family History  Problem Relation Age of Onset   Coronary artery disease Mother    Heart disease Mother    Pancreatic cancer Father    Cancer Father    Tremor Sister    Colon cancer Sister        2021   Esophageal cancer Neg Hx    Stomach cancer Neg Hx    Rectal cancer Neg Hx    Social History   Socioeconomic History   Marital status: Widowed    Spouse name: Not on file   Number of children: 3   Years of education: 4 years college   Highest education level: Bachelor's degree (e.g., BA, AB, BS)  Occupational History   Occupation: retired  Tobacco Use   Smoking status: Never   Smokeless tobacco: Never  Vaping Use   Vaping Use: Never used  Substance and Sexual Activity   Alcohol use: No   Drug use: No   Sexual activity: Not Currently  Other Topics Concern   Not on file  Social History Narrative   Lives alone   Caffeine use: very little   Right handed       3 children    2 in [place] and one in near [facility] graduate and played basketball there   Graduated in 59    Social Determinants of Health   Financial Resource Strain: Low Risk    Difficulty of Paying Living Expenses: Not hard at all  Food Insecurity: No Food Insecurity   Worried About Charity fundraiser in the Last Year: Never true   Arboriculturist in the Last Year: Never true  Transportation Needs: No Transportation Needs   Lack of Transportation (Medical): No   Lack of Transportation (Non-Medical): No  Physical Activity: Inactive   Days of Exercise per Week: 0 days   Minutes of Exercise per Session: 0 min  Stress: No Stress Concern Present   Feeling of Stress : Not at all  Social Connections: Moderately Integrated   Frequency of Communication with Friends and Family: More than three times a week   Frequency of Social Gatherings with Friends and Family: More than three times a week   Attends Religious Services: More than 4 times per year    Active Member of [employer] or Organizations: Yes   Attends Archivist Meetings: More than 4 times per year   Marital Status: Widowed    Clinical Intake: Diabetes: No  Diabetic? No  Activities of Daily Living In your present state of health, do you have any difficulty performing the following activities: 03/28/2021  Hearing? N  Vision? N  Difficulty concentrating or making decisions? N  Walking or climbing stairs? N  Dressing or bathing? N  Doing errands, shopping? N  Preparing Food and eating ? Y  Comment  Sisters Assist with preparing meals  Using the Toilet? N  In the past six months, have you accidently leaked urine? N  Do you have problems with loss of bowel control? N  Managing your Medications? N  Managing your Finances? N  Housekeeping or managing your Housekeeping? N  Some recent data might be hidden    Patient Care Team: [name], MD as PCP - General (Family Medicine) Constance Haw, MD as PCP - Electrophysiology (Cardiology) [place], Peter M, MD as PCP - Cardiology (Cardiology)  Indicate any recent Medical Services you may have received from other than [facility] providers in the past year (date may be approximate).     Assessment:   This is a routine wellness examination for Demontae.  Hearing/Vision screen Hearing Screening - Comments:: No difficulty hearing Vision Screening - Comments:: Wears reading. Followed by Dr Georgina Peer  Dietary issues and exercise activities discussed: Current Exercise Habits: The patient does not participate in regular exercise at present   Goals Addressed             This Visit's Progress    Exercise 150 min/wk Moderate Activity       [facility] when or if [facility] settles.       Depression Screen PHQ 2/9 Scores 03/28/2021 05/03/2020 03/01/2019 10/30/2018 07/09/2017 07/09/2017 06/25/2017  PHQ - 2 Score 0 0 0 0 0 0 0  PHQ- 9 Score - 2 - 0 - - -    Fall Risk Fall Risk  03/28/2021 10/28/2019 05/04/2019  03/01/2019 07/09/2017  Falls in the past year? 0 0 0 1 No  Number falls in past yr: 0 - 0 0 -  Injury with Fall? 0 - - 0 -  Risk for fall due to : - - Impaired balance/gait History of fall(s);Impaired balance/gait;Medication side effect -  Follow up - - Education provided;Falls prevention discussed;Follow up appointment Falls evaluation completed;Education provided;Falls prevention discussed -    FALL RISK PREVENTION PERTAINING TO THE HOME:  Any stairs in or around the home? Yes  If so, are there any without handrails? Yes Home free of loose throw rugs in walkways, pet beds, electrical cords, etc? Yes  Adequate lighting in your home to reduce risk of falls? Yes   ASSISTIVE DEVICES UTILIZED TO PREVENT FALLS:  Life alert? No  Use of a cane, walker or w/c? No  Grab bars in the bathroom? No  Shower chair or bench in shower? No  Elevated toilet seat or a handicapped toilet? Yes   TIMED UP AND GO:  Was the test performed? Yes .  Length of time to ambulate 10 feet: 5 sec.   Gait steady and fast without use of assistive device  Cognitive Function: MMSE - Mini Mental State Exam 07/09/2017  Not completed: (No Data)     6CIT Screen 03/28/2021 03/01/2019  What Year? 0 points 0 points  What month? 0 points 0 points  What time? 0 points 0 points  Count back from 20 0 points 0 points  Months in reverse 0 points 0 points  Repeat phrase 2 points 2 points  Total Score 2 2    Immunizations Immunization History  Administered Date(s) Administered   Fluad Quad(high Dose 65+) 12/08/2018, 02/10/2020   Influenza Split 12/31/2010, 12/17/2011, 01/24/2015   Influenza Whole 03/25/2001, 01/28/2007, 12/10/2007, 02/08/2009, 01/03/2010   Influenza, High Dose Seasonal PF 12/18/2012, 02/08/2015, 11/28/2015, 01/24/2016, 12/25/2016, 01/08/2018   Influenza,inj,Quad PF,6+ Mos 02/07/2014   Influenza-Unspecified 12/24/1999, 01/23/2001,  01/23/2002, 01/27/2002, 02/01/2003, 01/24/2004, 01/03/2005, 12/23/2005,  12/24/2006, 03/25/2009, 01/24/2011, 11/24/2011, 12/24/2011, 11/24/2018   Moderna Sars-Covid-2 Vaccination 05/07/2019, 06/04/2019, 02/10/2020   Pneumococcal Conjugate-13 05/24/2013, 02/07/2014   Pneumococcal Polysaccharide-23 03/25/2001, 06/24/2012   Pneumococcal-Unspecified 01/23/2002   Td 03/25/2001, 06/24/2012   Tdap 05/20/2011   Zoster Recombinat (Shingrix) 07/30/2019, 07/30/2019   Zoster, Live 03/25/2010, 05/20/2012    Qualifies for Shingles Vaccine? Yes   Zostavax completed No   Shingrix Completed?: No.    Education has been provided regarding the importance of this vaccine. Patient has been advised to call insurance company to determine out of pocket expense if they have not yet received this vaccine. Advised may also receive vaccine at local pharmacy or Health Dept. Verbalized acceptance and understanding.  Screening Tests Health Maintenance  Topic Date Due   Zoster Vaccines- Shingrix (2 of 2) 06/26/2021 (Originally 09/24/2019)   TETANUS/TDAP  06/25/2022   Pneumonia Vaccine 5+ Years old  Completed   INFLUENZA VACCINE  Completed   COVID-19 Vaccine  Completed   HPV VACCINES  Aged Out    Health Maintenance  There are no preventive care reminders to display for this patient.   Additional Screening:   Vision Screening: Recommended annual ophthalmology exams for early detection of glaucoma and other disorders of the eye. Is the patient up to date with their annual eye exam?  Yes  Who is the provider or what is the name of the office in which the patient attends annual eye exams?  Followed by Dr Georgina Peer    Dental Screening: Recommended annual dental exams for proper oral hygiene  Community Resource Referral / Chronic Care Management:  CRR required this visit?  No   CCM required this visit?  No      Plan:     I have personally reviewed and noted the following in the patients chart:   Medical and social history Use of alcohol, tobacco or illicit drugs  Current  medications and supplements including opioid prescriptions. Patient is not currently taking opioid prescriptions. Functional ability and status Nutritional status Physical activity Advanced directives List of other physicians Hospitalizations, surgeries, and ER visits in previous 12 months Vitals Screenings to include cognitive, depression, and falls Referrals and appointments  In addition, I have reviewed and discussed with patient certain preventive protocols, quality metrics, and best practice recommendations. A written personalized care plan for preventive services as well as general preventive health recommendations were provided to patient.     Criselda Peaches, LPN   05/31/9371   Nurse Note: Patient would like to be contacted has questions concerning Imaging Reports from [facility]. Copy placed on desk.

## 2021-03-30 ENCOUNTER — Ambulatory Visit: Payer: Medicare HMO | Admitting: Cardiology

## 2021-03-30 NOTE — Progress Notes (Signed)
Remote pacemaker transmission.   

## 2021-04-09 ENCOUNTER — Other Ambulatory Visit: Payer: Self-pay

## 2021-04-09 MED ORDER — ALPRAZOLAM 0.5 MG PO TABS: 0.5000 mg | ORAL_TABLET | Freq: Two times a day (BID) | ORAL | 0 refills | Status: DC

## 2021-04-17 ENCOUNTER — Other Ambulatory Visit: Payer: Medicare HMO

## 2021-04-18 ENCOUNTER — Inpatient Hospital Stay: Payer: Medicare HMO | Attending: Oncology

## 2021-04-18 ENCOUNTER — Ambulatory Visit (HOSPITAL_COMMUNITY)
Admission: RE | Admit: 2021-04-18 | Discharge: 2021-04-18 | Disposition: A | Discharge status: Home / Self Care | Payer: Medicare HMO | Source: Ambulatory Visit | Attending: Oncology | Admitting: Oncology

## 2021-04-18 ENCOUNTER — Other Ambulatory Visit: Payer: Self-pay

## 2021-04-18 DIAGNOSIS — J984 Other disorders of lung: Secondary | ICD-10-CM | POA: Diagnosis not present

## 2021-04-18 DIAGNOSIS — M1612 Unilateral primary osteoarthritis, left hip: Secondary | ICD-10-CM | POA: Diagnosis not present

## 2021-04-18 DIAGNOSIS — M47812 Spondylosis without myelopathy or radiculopathy, cervical region: Secondary | ICD-10-CM | POA: Diagnosis not present

## 2021-04-18 DIAGNOSIS — M47817 Spondylosis without myelopathy or radiculopathy, lumbosacral region: Secondary | ICD-10-CM | POA: Diagnosis not present

## 2021-04-18 DIAGNOSIS — C434 Malignant melanoma of scalp and neck: Secondary | ICD-10-CM | POA: Diagnosis not present

## 2021-04-18 DIAGNOSIS — K7689 Other specified diseases of liver: Secondary | ICD-10-CM | POA: Diagnosis not present

## 2021-04-18 DIAGNOSIS — K862 Cyst of pancreas: Secondary | ICD-10-CM | POA: Diagnosis not present

## 2021-04-18 DIAGNOSIS — N281 Cyst of kidney, acquired: Secondary | ICD-10-CM | POA: Diagnosis not present

## 2021-04-18 DIAGNOSIS — I2510 Atherosclerotic heart disease of native coronary artery without angina pectoris: Secondary | ICD-10-CM | POA: Diagnosis not present

## 2021-04-18 DIAGNOSIS — M439 Deforming dorsopathy, unspecified: Secondary | ICD-10-CM | POA: Diagnosis not present

## 2021-04-18 DIAGNOSIS — I6523 Occlusion and stenosis of bilateral carotid arteries: Secondary | ICD-10-CM | POA: Diagnosis not present

## 2021-04-18 MED ORDER — IOHEXOL 300 MG/ML  SOLN
100.0000 mL | Freq: Once | INTRAMUSCULAR | Status: AC | PRN
  Administered 2021-04-18: 16:00:00 100 mL via INTRAVENOUS

## 2021-04-23 DIAGNOSIS — M1990 Unspecified osteoarthritis, unspecified site: Secondary | ICD-10-CM | POA: Diagnosis not present

## 2021-04-23 DIAGNOSIS — E785 Hyperlipidemia, unspecified: Secondary | ICD-10-CM | POA: Diagnosis not present

## 2021-04-23 DIAGNOSIS — K219 Gastro-esophageal reflux disease without esophagitis: Secondary | ICD-10-CM | POA: Diagnosis not present

## 2021-04-23 DIAGNOSIS — D6869 Other thrombophilia: Secondary | ICD-10-CM | POA: Diagnosis not present

## 2021-04-23 DIAGNOSIS — H409 Unspecified glaucoma: Secondary | ICD-10-CM | POA: Diagnosis not present

## 2021-04-23 DIAGNOSIS — I4891 Unspecified atrial fibrillation: Secondary | ICD-10-CM | POA: Diagnosis not present

## 2021-04-23 DIAGNOSIS — N400 Enlarged prostate without lower urinary tract symptoms: Secondary | ICD-10-CM | POA: Diagnosis not present

## 2021-04-23 DIAGNOSIS — G250 Essential tremor: Secondary | ICD-10-CM | POA: Diagnosis not present

## 2021-04-23 DIAGNOSIS — I10 Essential (primary) hypertension: Secondary | ICD-10-CM | POA: Diagnosis not present

## 2021-04-23 DIAGNOSIS — Z008 Encounter for other general examination: Secondary | ICD-10-CM | POA: Diagnosis not present

## 2021-04-23 DIAGNOSIS — L309 Dermatitis, unspecified: Secondary | ICD-10-CM | POA: Diagnosis not present

## 2021-04-23 DIAGNOSIS — R69 Illness, unspecified: Secondary | ICD-10-CM | POA: Diagnosis not present

## 2021-04-23 DIAGNOSIS — E039 Hypothyroidism, unspecified: Secondary | ICD-10-CM | POA: Diagnosis not present

## 2021-04-24 ENCOUNTER — Ambulatory Visit: Payer: Medicare HMO | Admitting: Neurology

## 2021-04-24 ENCOUNTER — Other Ambulatory Visit: Payer: Self-pay

## 2021-04-24 ENCOUNTER — Encounter: Payer: Self-pay | Admitting: Neurology

## 2021-04-24 VITALS — BP 104/74 | HR 60 | Ht 74.0 in | Wt 186.0 lb

## 2021-04-24 DIAGNOSIS — G250 Essential tremor: Secondary | ICD-10-CM

## 2021-04-24 MED ORDER — TOPIRAMATE 25 MG PO TABS: ORAL_TABLET | ORAL | 1 refills | Status: DC

## 2021-04-24 NOTE — Progress Notes (Signed)
PATIENT: Daniel Everett DOB: 08/30/1931  REASON FOR VISIT: Follow up for tremor HISTORY FROM: Patient PRIMARY NEUROLOGIST: Dr. Rexene Alberts   HISTORY OF PRESENT ILLNESS: Today 04/24/21 Daniel Everett here today for follow-up with history of essential tremor.  Has tried multiple medications (gabapentin, Klonopin, cannot try Primidone due to Eliquis). Currently on xanax 0.5 mg twice daily, Topamax 25 mg twice daily. Is tolerating medications. Some days tremor is worse than others, equal right and left handed. He doesn't know where he would be without the medications.  Lives alone.  No falls.  He does note his balance is not what it used to be. Today is a good day for tremor.    10/12/2020 Dr. Jannifer Franklin: Daniel Everett is an 88 year old right-handed white male with a history of essential tremor.  The patient has had gradual worsening of the tremors as he has gotten older.  The patient did not feel that the clonazepam was helpful, he was switched to alprazolam.  He cannot take Mysoline as he is on Eliquis.  The patient gained some benefit with the alprazolam, but he still has a lot of problems with handwriting and with buttoning buttons.  He does not have any drowsiness on the medication.  He denies any dizziness.  He does report some mild gait instability, he has not had any falls.  He recently did have the COVID virus without hardly any symptoms, he has been fully vaccinated.  He comes to this office for further evaluation.   REVIEW OF SYSTEMS: Out of a complete 14 system review of symptoms, the patient complains only of the following symptoms, and all other reviewed systems are negative.  Tremor   ALLERGIES: No Known Allergies  HOME MEDICATIONS: Outpatient Medications Prior to Visit  Medication Sig Dispense Refill   acetaminophen (TYLENOL) 650 MG CR tablet Take 650 mg by mouth 2 (two) times a day.     ALPRAZolam (XANAX) 0.5 MG tablet Take 1 tablet (0.5 mg total) by mouth 2 (two) times daily. 60 tablet 0    amLODipine (NORVASC) 5 MG tablet TAKE 1 TABLET DAILY 90 tablet 0   apixaban (ELIQUIS) 5 MG TABS tablet Take 1 tablet (5 mg total) by mouth 2 (two) times daily. 180 tablet 3   atorvastatin (LIPITOR) 20 MG tablet TAKE 1 TABLET DAILY 90 tablet 1   Cholecalciferol (VITAMIN D3) 50 MCG (2000 UT) TABS Take 2,000 Units by mouth daily.     Ciclopirox 0.77 % gel Apply 1 application topically daily as needed.     diclofenac Sodium (VOLTAREN) 1 % GEL Apply topically.     finasteride (PROSCAR) 5 MG tablet TAKE 1 TABLET DAILY 90 tablet 1   hydrochlorothiazide (MICROZIDE) 12.5 MG capsule TAKE 1 CAPSULE DAILY 90 capsule 1   levothyroxine (SYNTHROID) 75 MCG tablet TAKE 1 TABLET DAILY 90 tablet 1   lisinopril (ZESTRIL) 20 MG tablet TAKE 1 TABLET DAILY 90 tablet 1   Multiple Vitamin (MULTIVITAMIN WITH MINERALS) TABS tablet Take 1 tablet by mouth daily. Senior Plus Multivitamin     omeprazole (PRILOSEC) 20 MG capsule Take 1 capsule (20 mg total) by mouth daily. 90 capsule 3   pyridOXINE (VITAMIN B-6) 100 MG tablet Take 100 mg by mouth daily.     timolol (TIMOPTIC) 0.5 % ophthalmic solution Place 1 drop into the left eye 2 (two) times daily.     topiramate (TOPAMAX) 25 MG tablet 1 TABLET TWICE DAILY 180 tablet 1   No facility-administered medications prior to visit.  PAST MEDICAL HISTORY: Past Medical History:  Diagnosis Date   Allergic rhinitis due to pollen 06/07/2008   Atrial fibrillation ([place])    Benign essential HTN    COLONIC POLYPS, HX OF 10/28/2006   DIVERTICULOSIS, COLON 11/27/2006   Gait abnormality 03/02/2018   GEN OSTEOARTHROSIS INVOLVING MULTIPLE SITES 01/03/2010   HYPERGLYCEMIA 10/18/2008   HYPOTHYROIDISM 11/27/2006   NEOPLASM, MALIGNANT, PROSTATE, HX OF, S/P TURP 05/28/2007   OSTEOARTHRITIS 11/27/2006   Peripheral neuropathy 04/08/2018   Presence of permanent cardiac pacemaker 08/20/2018   WEIGHT LOSS, RECENT 11/27/2006    PAST SURGICAL HISTORY: Past Surgical History:  Procedure Laterality Date    arthroscopy rt knee     EXCISION MASS NECK Left 01/29/2019   Procedure: 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;  Surgeon: [name], MD;  Location: [facility];  Service: ENT;  Laterality: Left;   HYDROCELE EXCISION / REPAIR     INSERT / REPLACE / REMOVE PACEMAKER  08/20/2018   PACEMAKER IMPLANT N/A 08/20/2018   Procedure: PACEMAKER IMPLANT;  Surgeon: Constance Haw, MD;  Location: [facility];  Service: Cardiovascular;  Laterality: N/A;   PROSTATE SURGERY     TURP   ROTATOR CUFF REPAIR      FAMILY HISTORY: Family History  Problem Relation Age of Onset   Coronary artery disease Mother    Heart disease Mother    Pancreatic cancer Father    Cancer Father    Tremor Sister    Colon cancer Sister        2021   Esophageal cancer Neg Hx    Stomach cancer Neg Hx    Rectal cancer Neg Hx     SOCIAL HISTORY: Social History   Socioeconomic History   Marital status: Widowed    Spouse name: Not on file   Number of children: 3   Years of education: 4 years college   Highest education level: Bachelor's degree (e.g., BA, AB, BS)  Occupational History   Occupation: retired  Tobacco Use   Smoking status: Never   Smokeless tobacco: Never  Vaping Use   Vaping Use: Never used  Substance and Sexual Activity   Alcohol use: No   Drug use: No   Sexual activity: Not Currently  Other Topics Concern   Not on file  Social History Narrative   Lives alone   Caffeine use: very little   Right handed       3 children    2 in [place] and one in near [facility] graduate and played basketball there   Graduated in 59    Social Determinants of Health   Financial Resource Strain: Low Risk    Difficulty of Paying Living Expenses: Not hard at all  Food Insecurity: No Food Insecurity   Worried About Charity fundraiser in the Last Year: Never true   Arboriculturist in the Last Year: Never true  Transportation Needs: No Transportation Needs   Lack of  Transportation (Medical): No   Lack of Transportation (Non-Medical): No  Physical Activity: Inactive   Days of Exercise per Week: 0 days   Minutes of Exercise per Session: 0 min  Stress: No Stress Concern Present   Feeling of Stress : Not at all  Social Connections: Moderately Integrated   Frequency of Communication with Friends and Family: More than three times a week   Frequency of Social Gatherings with Friends and Family: More than three times a week  Attends Religious Services: More than 4 times per year   Active Member of Clubs or Organizations: Yes   Attends Archivist Meetings: More than 4 times per year   Marital Status: Widowed  Human resources officer Violence: Not At Risk   Fear of Current or Ex-Partner: No   Emotionally Abused: No   Physically Abused: No   Sexually Abused: No      PHYSICAL EXAM  Vitals:   04/24/21 1416  BP: 104/74  Pulse: 60  Weight: 186 lb (84.4 kg)  Height: 6\' 2"  (1.88 m)   Body mass index is 23.88 kg/m.  Generalized: Well developed, in no acute distress   Neurological examination  Mentation: Alert oriented to time, place, history taking. Follows all commands speech and language fluent Cranial nerve II-XII: Pupils were equal round reactive to light. Extraocular movements were full, visual field were full on confrontational test. Facial sensation and strength were normal. Head turning and shoulder shrug  were normal and symmetric. Motor: The motor testing reveals 5 over 5 strength of all 4 extremities. Good symmetric motor tone is noted throughout.  Sensory: Sensory testing is intact to soft touch on all 4 extremities. No evidence of extinction is noted.  Coordination: Cerebellar testing reveals good finger-nose-finger and heel-to-shin bilaterally.  Mild tremor with finger-nose-finger bilaterally.  Handwriting sample shows mild to moderate tremor as well as spiral draw.  Gait and station: Gait is slightly wide-based, but is steady and  independent. Reflexes: Deep tendon reflexes are symmetric and normal bilaterally.   DIAGNOSTIC DATA (LABS, IMAGING, TESTING) - I reviewed patient records, labs, notes, testing and imaging myself where available.  Lab Results  Component Value Date   WBC 5.1 11/02/2020   HGB 13.8 11/02/2020   HCT 39.8 11/02/2020   MCV 93.2 11/02/2020   PLT 169 11/02/2020      Component Value Date/Time   NA 136 11/02/2020 1239   NA 139 04/04/2016 1413   K 3.8 11/02/2020 1239   CL 101 11/02/2020 1239   CO2 27 11/02/2020 1239   GLUCOSE 105 (H) 11/02/2020 1239   BUN 16 11/02/2020 1239   BUN 16 04/04/2016 1413   CREATININE 0.93 11/02/2020 1239   CALCIUM 9.1 11/02/2020 1239   PROT 6.6 11/02/2020 1239   PROT 6.2 04/08/2018 1503   ALBUMIN 3.8 11/02/2020 1239   AST 28 11/02/2020 1239   ALT 18 11/02/2020 1239   ALKPHOS 50 11/02/2020 1239   BILITOT 0.9 11/02/2020 1239   GFRNONAA >60 11/02/2020 1239   GFRAA >60 09/28/2019 0948   Lab Results  Component Value Date   CHOL 161 10/30/2018   HDL 38.00 (L) 10/30/2018   LDLCALC 101 (H) 10/30/2018   TRIG 108.0 10/30/2018   CHOLHDL 4 10/30/2018   Lab Results  Component Value Date   HGBA1C 6.0 10/18/2008   Lab Results  Component Value Date   VITAMINB12 458 03/02/2018   Lab Results  Component Value Date   TSH 1.59 10/30/2018      ASSESSMENT AND PLAN 88 y.o. year old male  has a past medical history of Allergic rhinitis due to pollen (06/07/2008), Atrial fibrillation ([place]), Benign essential HTN, COLONIC POLYPS, HX OF (10/28/2006), DIVERTICULOSIS, COLON (11/27/2006), Gait abnormality (03/02/2018), GEN OSTEOARTHROSIS INVOLVING MULTIPLE SITES (01/03/2010), HYPERGLYCEMIA (10/18/2008), HYPOTHYROIDISM (11/27/2006), NEOPLASM, MALIGNANT, PROSTATE, HX OF, S/P TURP (05/28/2007), OSTEOARTHRITIS (11/27/2006), Peripheral neuropathy (04/08/2018), Presence of permanent cardiac pacemaker (08/20/2018), and WEIGHT LOSS, RECENT (11/27/2006). here with:   Essential tremor  -Today  is a good day,  His current medication regimen seems to be working relatively well to control his symptoms, in the past has tried multiple medications (gabapentin, Lyrica, clonazepam, is not a candidate for primidone due to taking Eliquis, have not offered propanolol due to being on multiple BP medications) -For now, we will continue alprazolam 0.5 mg twice daily, Topamax 25 mg twice daily for tremor -He needs to check with his insurance about which pharmacy to send the alprazolam, he will let me know -He will follow-up here in 6 months or sooner if needed, he will now be followed by Dr. Rexene Alberts, claims we might not be in his network anymore  Butler Denmark, AGNP-C, DNP 04/24/2021, 2:48 PM [facility] [address], [place], [place] [address] [phone]

## 2021-04-24 NOTE — Patient Instructions (Addendum)
Great to see you today  Continue current medications, check with [facility] about where to send Xanax See you back in 6 months

## 2021-04-24 NOTE — Progress Notes (Deleted)
error 

## 2021-04-25 ENCOUNTER — Inpatient Hospital Stay: Payer: Medicare HMO | Attending: Oncology | Admitting: Oncology

## 2021-04-25 VITALS — BP 118/68 | HR 74 | Temp 98.1°F | Resp 16 | Ht 74.0 in | Wt 186.1 lb

## 2021-04-25 DIAGNOSIS — C434 Malignant melanoma of scalp and neck: Secondary | ICD-10-CM | POA: Diagnosis not present

## 2021-04-25 DIAGNOSIS — Z79899 Other long term (current) drug therapy: Secondary | ICD-10-CM | POA: Diagnosis not present

## 2021-04-25 NOTE — Progress Notes (Signed)
Hematology and Oncology Follow Up Visit  Daniel Everett [id number] [birth year] 88 y.o. 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  He was found to have 1.2 mm, T2N0 tumor on the left side.  Prior Therapy:  He is status post wide excision and sentinel lymph node sampling completed in November 2020.  Current therapy: Active surveillance.   Interim History: Mr. Jelinski presents today for a follow-up evaluation.  Since the last visit, he reports no major changes in his health.  He denies any skin rashes, lesions or any constitutional symptoms at this time.  He denies any weight loss or fatigue.  He continues to be reasonably active and attends to activities of daily living.  He still able to drive and ambulate without any major difficulties.  He continues to have dermatology surveillance.   Medications: Reviewed without changes. Current Outpatient Medications  Medication Sig Dispense Refill   acetaminophen (TYLENOL) 650 MG CR tablet Take 650 mg by mouth 2 (two) times a day.     ALPRAZolam (XANAX) 0.5 MG tablet Take 1 tablet (0.5 mg total) by mouth 2 (two) times daily. 60 tablet 0   amLODipine (NORVASC) 5 MG tablet TAKE 1 TABLET DAILY 90 tablet 0   apixaban (ELIQUIS) 5 MG TABS tablet Take 1 tablet (5 mg total) by mouth 2 (two) times daily. 180 tablet 3   atorvastatin (LIPITOR) 20 MG tablet TAKE 1 TABLET DAILY 90 tablet 1   Cholecalciferol (VITAMIN D3) 50 MCG (2000 UT) TABS Take 2,000 Units by mouth daily.     Ciclopirox 0.77 % gel Apply 1 application topically daily as needed.     diclofenac Sodium (VOLTAREN) 1 % GEL Apply topically.     finasteride (PROSCAR) 5 MG tablet TAKE 1 TABLET DAILY 90 tablet 1   hydrochlorothiazide (MICROZIDE) 12.5 MG capsule TAKE 1 CAPSULE DAILY 90 capsule 1   levothyroxine (SYNTHROID) 75 MCG tablet TAKE 1 TABLET DAILY 90 tablet 1   lisinopril (ZESTRIL)  20 MG tablet TAKE 1 TABLET DAILY 90 tablet 1   Multiple Vitamin (MULTIVITAMIN WITH MINERALS) TABS tablet Take 1 tablet by mouth daily. Senior Plus Multivitamin     omeprazole (PRILOSEC) 20 MG capsule Take 1 capsule (20 mg total) by mouth daily. 90 capsule 3   pyridOXINE (VITAMIN B-6) 100 MG tablet Take 100 mg by mouth daily.     timolol (TIMOPTIC) 0.5 % ophthalmic solution Place 1 drop into the left eye 2 (two) times daily.     topiramate (TOPAMAX) 25 MG tablet 1 TABLET TWICE DAILY 180 tablet 1   No current facility-administered medications for this visit.     Allergies: No Known Allergies    Physical Exam:   Blood pressure 118/68, pulse 74, temperature 98.1 F (36.7 C), temperature source Temporal, resp. rate 16, height 6\' 2"  (1.88 m), weight 186 lb 1.6 oz (84.4 kg), SpO2 100 %.    ECOG:  1    General appearance: Alert, awake without any distress. Head: Atraumatic without abnormalities Oropharynx: Without any thrush or ulcers. Eyes: No scleral icterus. Lymph nodes: No lymphadenopathy noted in the cervical, supraclavicular, or axillary nodes Heart:regular rate and rhythm, without any murmurs or gallops.   Lung: Clear to auscultation without any rhonchi, wheezes or dullness to percussion. Abdomin: Soft, nontender without any shifting dullness or ascites. Musculoskeletal: No clubbing or cyanosis. Neurological: No motor or sensory deficits. Skin: No rashes or  lesions.        Lab Results: Lab Results  Component Value Date   WBC 5.1 11/02/2020   HGB 13.8 11/02/2020   HCT 39.8 11/02/2020   MCV 93.2 11/02/2020   PLT 169 11/02/2020     Chemistry      Component Value Date/Time   NA 136 11/02/2020 1239   NA 139 04/04/2016 1413   K 3.8 11/02/2020 1239   CL 101 11/02/2020 1239   CO2 27 11/02/2020 1239   BUN 16 11/02/2020 1239   BUN 16 04/04/2016 1413   CREATININE 0.93 11/02/2020 1239      Component Value Date/Time   CALCIUM 9.1 11/02/2020 1239   ALKPHOS 50  11/02/2020 1239   AST 28 11/02/2020 1239   ALT 18 11/02/2020 1239   BILITOT 0.9 11/02/2020 1239       Study Result  Narrative & Impression  CLINICAL DATA:  Cutaneous melanoma of the neck diagnosed in 2020. Evaluate treatment response.   EXAM: CT CHEST, ABDOMEN, AND PELVIS WITH CONTRAST   TECHNIQUE: Multidetector CT imaging of the chest, abdomen and pelvis was performed following the standard protocol during bolus administration of intravenous contrast.   RADIATION DOSE REDUCTION: This exam was performed according to the departmental dose-optimization program which includes automated exposure control, adjustment of the mA and/or kV according to patient size and/or use of iterative reconstruction technique.   CONTRAST:  152mL OMNIPAQUE IOHEXOL 300 MG/ML  SOLN   COMPARISON:  04/11/2020   FINDINGS: CT CHEST FINDINGS   Cardiovascular: Aortic atherosclerosis. Ascending aortic dilatation is similar including at 4.5 cm on 35/2. Normal transverse and descending aortic caliber. Moderate cardiomegaly with marked right atrial enlargement. Right ventricular pacer. Left main and 3 vessel coronary artery calcification. No central pulmonary embolism, on this non-dedicated study.   Mediastinum/Nodes: No mediastinal or hilar adenopathy.   Lungs/Pleura: No pleural fluid. Biapical pleuroparenchymal and right greater than left basilar scarring.   Musculoskeletal: No acute osseous abnormality.   CT ABDOMEN PELVIS FINDINGS   Hepatobiliary: Hepatic cysts, including within the dome of up to 4.3 cm. Other smaller lesions which are technically too small to characterize, but also favored to represent cysts. No suspicious liver lesion. Normal gallbladder, without biliary ductal dilatation.   Pancreas: Pancreatic atrophy is likely within normal variation for age.   A cystic lesion within the ventral pancreatic head measures 2.2 cm on 81/2 is felt to be similar to on the prior exam  (when remeasured). There is also a more vague area of nonspecific hypoattenuation within the pancreatic uncinate process which is similar at 1.7 cm on 80/2. No main pancreatic duct dilatation.   Spleen: Normal in size, without focal abnormality.   Adrenals/Urinary Tract: Normal adrenal glands. Bilateral too small to characterize renal lesions. Inter/lower pole 3.9 cm left renal cyst. No hydronephrosis. Normal urinary bladder.   Stomach/Bowel: Normal stomach, without wall thickening. Normal colon and terminal ileum. Normal small bowel caliber. Small bowel loops is positioned at the entrance to the right inguinal canal, without well-defined hernia. Example 129/2.   Vascular/Lymphatic: Aortic atherosclerosis. Circumaortic left renal vein. No abdominopelvic adenopathy.   Reproductive: Radiation seeds in the prostate.   Other: No significant free fluid. No free intraperitoneal air. No evidence of omental or peritoneal disease.   Musculoskeletal: Left hip osteoarthritis. Mild osteopenia. Lumbosacral spondylosis. S shaped thoracolumbar spine curvature.   IMPRESSION: 1. No acute process or evidence of metastatic disease within the chest, abdomen, or pelvis. 2. Pancreatic cystic lesion and vague nonspecific  hypoattenuating area within the pancreatic head, unchanged. Recommend attention on follow-up. 3. Coronary artery atherosclerosis. Aortic Atherosclerosis (ICD10-I70.0). 4. Similar 4.5 cm ascending thoracic aortic aneurysm. Ascending thoracic aortic aneurysm. Recommend semi-annual imaging followup by CTA or MRA and referral to cardiothoracic surgery if not already obtained. This recommendation follows 2010 ACCF/AHA/AATS/ACR/ASA/SCA/SCAI/SIR/STS/SVM Guidelines for the Diagnosis and Management of Patients With Thoracic Aortic Disease. Circulation. 2010; 121: B017-P102. Aortic aneurysm NOS (ICD10-I71.9)      Impression and Plan:   88 year old with:  1.  Melanoma of the left  neck diagnosed in October 2020.  He was found to have T2N0 tumor without any relapsed disease.  The natural course of this disease was reviewed at this time and treatment choices were discussed.  He is currently on active surveillance without any evidence of relapsed disease.  CT scan obtained on April 18, 2021 was personally reviewed and showed no evidence of relapsed disease.  At this time I recommended annual surveillance without any imaging studies.  We will obtain CT scan if he has any indication or clinical symptoms.  Salvage therapy options including immunotherapy can be used if he develop relapsed disease.     2.  Dermatology surveillance: He is up-to-date the child recommended continuing for the time being.  3.  Pancreatic cyst: Unchanged on examination at this time.  No further evaluation needed.   4.  Follow-up: In 12 months for repeat follow-up.   30  minutes were spent on this visit.  The time was dedicated to reviewing laboratory data, disease status update, reviewing imaging studies and discussing salvage therapy options for the future.     Zola Button, MD 2/1/20232:36 PM

## 2021-05-02 ENCOUNTER — Other Ambulatory Visit: Payer: Self-pay | Admitting: Neurology

## 2021-05-04 ENCOUNTER — Ambulatory Visit: Payer: Medicare HMO | Admitting: Family Medicine

## 2021-05-08 ENCOUNTER — Ambulatory Visit: Payer: Medicare HMO | Admitting: Family Medicine

## 2021-05-11 NOTE — Progress Notes (Signed)
Cardiology Office Note   Date:  05/15/2021   ID:  Daniel Everett, DOB 10-May-1931, MRN [id number]  PCP:  Daniel Post, MD  Cardiologist:  Dr Daniel  Daniel Fluitt Martinique, MD 03/27/2016   History of Present Illness: Daniel Everett is a 88 y.o. male with a history of hypothyroid, hyperglycemia, prostate CA s/p TURP, OA, colon polyps, HTN, RBBB- seen for follow up of Atrial fibrillation.  He was initially seen by Daniel Ferries PA-C in January 2018 for atrial fibrillation with  HR 50s at baseline, on Xarelto. On no AV nodal blocking agents. Holter ordered. BP up, HCTZ added. BMET 1 week later was ok. Holter w/ no pauses > 3.2 sec, HR generally slow, 52 avg, lowest 21 at 5 am. Echo showed normal EF. Mild MR, mod TR. Severe biatrial enlargement.   He has been seen by Dr. Jannifer Everett in January for evaluation of tremor, gait instability.  EMG c/w peripheral neuropathy. Other lab studies normal. Was noted to have marked bradycardia with Afib and he subsequently had a [place] single-chamber pacemaker implanted 08/20/2018. Followed by Dr Daniel Everett. Last check in September 2021 was satisfactory.   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   On follow up today he is feeling well. He does still have balance problems. Is not having any chest  pain or dyspnea. Mild ankle swelling. Has lost about 15 lbs. No dizziness. Has bowel irregularity.     Past Medical History:  Diagnosis Date   Allergic rhinitis due to pollen 06/07/2008   Atrial fibrillation ([place])    Benign essential HTN    COLONIC POLYPS, HX OF 10/28/2006   DIVERTICULOSIS, COLON 11/27/2006   Gait abnormality 03/02/2018   GEN OSTEOARTHROSIS INVOLVING MULTIPLE SITES 01/03/2010   HYPERGLYCEMIA 10/18/2008   HYPOTHYROIDISM 11/27/2006   NEOPLASM, MALIGNANT, PROSTATE, HX OF, S/P TURP 05/28/2007   OSTEOARTHRITIS 11/27/2006   Peripheral neuropathy 04/08/2018   Presence of permanent cardiac pacemaker 08/20/2018   WEIGHT LOSS, RECENT 11/27/2006     Past Surgical History:  Procedure Laterality Date   arthroscopy rt knee     EXCISION MASS NECK Left 01/29/2019   Procedure: 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;  Surgeon: Daniel Gala, MD;  Location: [facility];  Service: ENT;  Laterality: Left;   HYDROCELE EXCISION / REPAIR     INSERT / REPLACE / REMOVE PACEMAKER  08/20/2018   PACEMAKER IMPLANT N/A 08/20/2018   Procedure: PACEMAKER IMPLANT;  Surgeon: Constance Haw, MD;  Location: [facility];  Service: Cardiovascular;  Laterality: N/A;   PROSTATE SURGERY     TURP   ROTATOR CUFF REPAIR      Current Outpatient Medications  Medication Sig Dispense Refill   acetaminophen (TYLENOL) 650 MG CR tablet Take 650 mg by mouth 2 (two) times a day.     ALPRAZolam (XANAX) 0.5 MG tablet Take 1 tablet (0.5 mg total) by mouth 2 (two) times daily. 60 tablet 0   apixaban (ELIQUIS) 5 MG TABS tablet Take 1 tablet (5 mg total) by mouth 2 (two) times daily. 180 tablet 3   atorvastatin (LIPITOR) 20 MG tablet TAKE 1 TABLET DAILY 90 tablet 1   Cholecalciferol (VITAMIN D3) 50 MCG (2000 UT) TABS Take 2,000 Units by mouth daily.     Ciclopirox 0.77 % gel Apply 1 application topically daily as needed.     diclofenac Sodium (VOLTAREN) 1 % GEL Apply topically.     finasteride (PROSCAR)  5 MG tablet TAKE 1 TABLET DAILY 90 tablet 1   levothyroxine (SYNTHROID) 75 MCG tablet TAKE 1 TABLET DAILY 90 tablet 1   lisinopril (ZESTRIL) 20 MG tablet TAKE 1 TABLET DAILY 90 tablet 1   Multiple Vitamin (MULTIVITAMIN WITH MINERALS) TABS tablet Take 1 tablet by mouth daily. Senior Plus Multivitamin     omeprazole (PRILOSEC) 20 MG capsule Take 1 capsule (20 mg total) by mouth daily. 90 capsule 3   pyridOXINE (VITAMIN B-6) 100 MG tablet Take 100 mg by mouth daily.     timolol (TIMOPTIC) 0.5 % ophthalmic solution Place 1 drop into the left eye 2 (two) times daily.     topiramate (TOPAMAX) 25 MG tablet 1 TABLET TWICE DAILY 180 tablet 1   No current  facility-administered medications for this visit.    Allergies:   Patient has no known allergies.    Social History:  The patient  reports that he has never smoked. He has never used smokeless tobacco. He reports that he does not drink alcohol and does not use drugs.   Family History:  The patient's family history includes Cancer in his father; Colon cancer in his sister; Coronary artery disease in his mother; Heart disease in his mother; Pancreatic cancer in his father; Tremor in his sister.    ROS:  Please see the history of present illness. All other systems are reviewed and negative.    PHYSICAL EXAM: VS:  BP (!) 80/60 (BP Location: Left Arm, Patient Position: Sitting)    Pulse 61    Ht 6\' 4"  (1.93 m)    Wt 184 lb (83.5 kg)    SpO2 97%    BMI 22.40 kg/m  , BMI Body mass index is 22.4 kg/m. GENERAL:  Well appearing, elderly WM in NAD HEENT:  PERRL, EOMI, sclera are clear. Oropharynx is clear. NECK:  No jugular venous distention, carotid upstroke brisk and symmetric, no bruits, no thyromegaly or adenopathy LUNGS:  Clear to auscultation bilaterally CHEST:  Unremarkable HEART:  RRR,    PMI not displaced or sustained,S1 and S2 within normal limits, no S3, no S4: no clicks, no rubs, no murmurs ABD:  Soft, nontender. BS +, no masses or bruits. No hepatomegaly, no splenomegaly EXT:  2 + pulses throughout, no edema, no cyanosis no clubbing SKIN:  Warm and dry.  No rashes NEURO:  Alert and oriented x 3. Cranial nerves II through XII intact. PSYCH:  Cognitively intact   Recent Labs: 11/02/2020: ALT 18; BUN 16; Creatinine 0.93; Hemoglobin 13.8; Platelet Count 169; Potassium 3.8; Sodium 136    Lipid Panel    Component Value Date/Time   CHOL 161 10/30/2018 0853   TRIG 108.0 10/30/2018 0853   HDL 38.00 (L) 10/30/2018 0853   CHOLHDL 4 10/30/2018 0853   VLDL 21.6 10/30/2018 0853   LDLCALC 101 (H) 10/30/2018 0853     Wt Readings from Last 3 Encounters:  05/15/21 184 lb (83.5 kg)   05/14/21 188 lb (85.3 kg)  04/25/21 186 lb 1.6 oz (84.4 kg)     Other studies Reviewed: Additional studies/ records that were reviewed today include: office notes and testing.  Ecg today shows Afib with ventricularly paced rhythm rate 61. I have personally reviewed and interpreted this study.   Holter 04/04/16: Study Highlights   Atrial fibrillation with slow ventricular response Rare PVCs and PVC couplets Longest pause 3.2 seconds    Echo 04/08/16: Study Conclusions   - Left ventricle: The cavity size was normal. Wall thickness  was   normal. Systolic function was normal. The estimated ejection   fraction was in the range of 55% to 60%. Wall motion was normal;   there were no regional wall motion abnormalities. The study is   not technically sufficient to allow evaluation of LV diastolic   function. - Aortic valve: There was trivial regurgitation. - Mitral valve: There was mild regurgitation. - Left atrium: The atrium was severely dilated. - Right atrium: The atrium was severely dilated. - Atrial septum: No defect or patent foramen ovale was identified. - Tricuspid valve: There was moderate regurgitation.  ASSESSMENT AND PLAN:  1.  Atrial fib-  chronic with slow ventricular response.  S/p PPM placement in May 2020. Clinically doing well. Continue Eliquis. On no rate slowing meds.   2. Chronic anticoag: CHA2DS2VASc=3 (age x 2, HTN). Continue Eliquis.   3. HTN- BP is way too low. Recommend stopping amlodipine and HCTZ. Monitor.   4. Peripheral Neuropathy. Followed by Dr. Jannifer Everett.   5. Bowel irregularity. Recommend a trial of daily psyllium.  Current medicines are reviewed at length with the patient today.  The patient has concerns regarding medicines. Concerns were addressed.  The following changes have been made:  no change  Labs/ tests ordered today include:   No orders of the defined types were placed in this encounter.  Follow up in one year  Signed, Lorre Opdahl  Martinique, MD  05/15/2021 4:20 PM    [facility]

## 2021-05-14 ENCOUNTER — Ambulatory Visit (INDEPENDENT_AMBULATORY_CARE_PROVIDER_SITE_OTHER): Payer: Medicare HMO | Admitting: Family Medicine

## 2021-05-14 ENCOUNTER — Encounter: Payer: Self-pay | Admitting: Family Medicine

## 2021-05-14 VITALS — BP 86/60 | HR 61 | Temp 97.7°F | Ht 74.0 in | Wt 188.0 lb

## 2021-05-14 DIAGNOSIS — E038 Other specified hypothyroidism: Secondary | ICD-10-CM | POA: Diagnosis not present

## 2021-05-14 DIAGNOSIS — E7800 Pure hypercholesterolemia, unspecified: Secondary | ICD-10-CM

## 2021-05-14 DIAGNOSIS — I959 Hypotension, unspecified: Secondary | ICD-10-CM | POA: Diagnosis not present

## 2021-05-14 DIAGNOSIS — I10 Essential (primary) hypertension: Secondary | ICD-10-CM | POA: Diagnosis not present

## 2021-05-14 NOTE — Patient Instructions (Addendum)
HOLD the HCTZ (hydrochlorothiazide) for now  Get BP reassessed with Dr Martinique tomorrow.

## 2021-05-14 NOTE — Progress Notes (Signed)
Established Patient Office Visit  Subjective:  Patient ID: Daniel Everett, male    DOB: 1931-07-28  Age: 88 y.o. MRN: [id number]  CC:  Chief Complaint  Patient presents with   Follow-up    HPI  [name] presents for 86-month medical follow-up.  He has history of atrial fibrillation, hypertension, hypothyroidism, essential tremor, peripheral neuropathy, osteoarthritis, hyperlipidemia.  Remains on anticoagulation with Eliquis.  Generally feeling well.  He states his biggest issue is that he feels like his balance is "off.  Denies vertigo.  No orthostatic symptoms.  Denies any recent falls.  He is followed by neurology.  Still working 20 hours/week.  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  He states he does this mostly for the benefit of "getting out." Denies any recent falls.  We do not have any recent lipids or thyroid functions.  He had CBC and CMP through oncology back in August.   Past Medical History:  Diagnosis Date   Allergic rhinitis due to pollen 06/07/2008   Atrial fibrillation ([place])    Benign essential HTN    COLONIC POLYPS, HX OF 10/28/2006   DIVERTICULOSIS, COLON 11/27/2006   Gait abnormality 03/02/2018   GEN OSTEOARTHROSIS INVOLVING MULTIPLE SITES 01/03/2010   HYPERGLYCEMIA 10/18/2008   HYPOTHYROIDISM 11/27/2006   NEOPLASM, MALIGNANT, PROSTATE, HX OF, S/P TURP 05/28/2007   OSTEOARTHRITIS 11/27/2006   Peripheral neuropathy 04/08/2018   Presence of permanent cardiac pacemaker 08/20/2018   WEIGHT LOSS, RECENT 11/27/2006    Past Surgical History:  Procedure Laterality Date   arthroscopy rt knee     EXCISION MASS NECK Left 01/29/2019   Procedure: 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;  Surgeon: [name], MD;  Location: [facility];  Service: ENT;  Laterality: Left;   HYDROCELE EXCISION / REPAIR     INSERT / REPLACE / REMOVE PACEMAKER  08/20/2018   PACEMAKER IMPLANT N/A 08/20/2018   Procedure: PACEMAKER IMPLANT;  Surgeon: Constance Haw, MD;  Location: [facility];  Service: Cardiovascular;  Laterality: N/A;   PROSTATE SURGERY     TURP   ROTATOR CUFF REPAIR      Family History  Problem Relation Age of Onset   Coronary artery disease Mother    Heart disease Mother    Pancreatic cancer Father    Cancer Father    Tremor Sister    Colon cancer Sister        2021   Esophageal cancer Neg Hx    Stomach cancer Neg Hx    Rectal cancer Neg Hx     Social History   Socioeconomic History   Marital status: Widowed    Spouse name: Not on file   Number of children: 3   Years of education: 4 years college   Highest education level: Bachelor's degree (e.g., BA, AB, BS)  Occupational History   Occupation: retired  Tobacco Use   Smoking status: Never   Smokeless tobacco: Never  Vaping Use   Vaping Use: Never used  Substance and Sexual Activity   Alcohol use: No   Drug use: No   Sexual activity: Not Currently  Other Topics Concern   Not on file  Social History Narrative   Lives alone   Caffeine use: very little   Right handed       3 children    2 in [place] and one in near [facility] graduate and played basketball  there   Graduated in 47    Social Determinants of Health   Financial Resource Strain: Low Risk    Difficulty of Paying Living Expenses: Not hard at all  Food Insecurity: No Food Insecurity   Worried About Charity fundraiser in the Last Year: Never true   Arboriculturist in the Last Year: Never true  Transportation Needs: No Transportation Needs   Lack of Transportation (Medical): No   Lack of Transportation (Non-Medical): No  Physical Activity: Inactive   Days of Exercise per Week: 0 days   Minutes of Exercise per Session: 0 min  Stress: No Stress Concern Present   Feeling of Stress : Not at all  Social Connections: Moderately Integrated   Frequency of Communication with Friends and Family: More than three times a week   Frequency of Social Gatherings with  Friends and Family: More than three times a week   Attends Religious Services: More than 4 times per year   Active Member of [employer] or Organizations: Yes   Attends Archivist Meetings: More than 4 times per year   Marital Status: Widowed  Human resources officer Violence: Not At Risk   Fear of Current or Ex-Partner: No   Emotionally Abused: No   Physically Abused: No   Sexually Abused: No    Outpatient Medications Prior to Visit  Medication Sig Dispense Refill   acetaminophen (TYLENOL) 650 MG CR tablet Take 650 mg by mouth 2 (two) times a day.     ALPRAZolam (XANAX) 0.5 MG tablet Take 1 tablet (0.5 mg total) by mouth 2 (two) times daily. 60 tablet 0   amLODipine (NORVASC) 5 MG tablet TAKE 1 TABLET DAILY 90 tablet 0   apixaban (ELIQUIS) 5 MG TABS tablet Take 1 tablet (5 mg total) by mouth 2 (two) times daily. 180 tablet 3   atorvastatin (LIPITOR) 20 MG tablet TAKE 1 TABLET DAILY 90 tablet 1   Cholecalciferol (VITAMIN D3) 50 MCG (2000 UT) TABS Take 2,000 Units by mouth daily.     Ciclopirox 0.77 % gel Apply 1 application topically daily as needed.     diclofenac Sodium (VOLTAREN) 1 % GEL Apply topically.     finasteride (PROSCAR) 5 MG tablet TAKE 1 TABLET DAILY 90 tablet 1   hydrochlorothiazide (MICROZIDE) 12.5 MG capsule TAKE 1 CAPSULE DAILY 90 capsule 1   levothyroxine (SYNTHROID) 75 MCG tablet TAKE 1 TABLET DAILY 90 tablet 1   lisinopril (ZESTRIL) 20 MG tablet TAKE 1 TABLET DAILY 90 tablet 1   Multiple Vitamin (MULTIVITAMIN WITH MINERALS) TABS tablet Take 1 tablet by mouth daily. Senior Plus Multivitamin     omeprazole (PRILOSEC) 20 MG capsule Take 1 capsule (20 mg total) by mouth daily. 90 capsule 3   pyridOXINE (VITAMIN B-6) 100 MG tablet Take 100 mg by mouth daily.     timolol (TIMOPTIC) 0.5 % ophthalmic solution Place 1 drop into the left eye 2 (two) times daily.     topiramate (TOPAMAX) 25 MG tablet 1 TABLET TWICE DAILY 180 tablet 1   No facility-administered medications prior  to visit.    No Known Allergies  ROS Review of Systems  Constitutional:  Negative for fatigue.  Eyes:  Negative for visual disturbance.  Respiratory:  Negative for cough, chest tightness and shortness of breath.   Cardiovascular:  Negative for chest pain, palpitations and leg swelling.  Neurological:  Negative for dizziness, syncope, weakness, light-headedness and headaches.     Objective:    Physical  Exam Constitutional:      General: He is not in acute distress.    Appearance: He is well-developed.  Neck:     Thyroid: No thyromegaly.  Cardiovascular:     Rate and Rhythm: Normal rate and regular rhythm.  Pulmonary:     Effort: Pulmonary effort is normal. No respiratory distress.     Breath sounds: Normal breath sounds. No wheezing or rales.  Musculoskeletal:     Cervical back: Neck supple.  Neurological:     Mental Status: He is alert and oriented to person, place, and time.    BP (!) 86/60    Pulse 61    Temp 97.7 F (36.5 C) (Oral)    Ht 6\' 2"  (1.88 m)    Wt 188 lb (85.3 kg)    SpO2 95%    BMI 24.14 kg/m  Wt Readings from Last 3 Encounters:  05/14/21 188 lb (85.3 kg)  04/25/21 186 lb 1.6 oz (84.4 kg)  04/24/21 186 lb (84.4 kg)     There are no preventive care reminders to display for this patient.  There are no preventive care reminders to display for this patient.  Lab Results  Component Value Date   TSH 1.59 10/30/2018   Lab Results  Component Value Date   WBC 5.1 11/02/2020   HGB 13.8 11/02/2020   HCT 39.8 11/02/2020   MCV 93.2 11/02/2020   PLT 169 11/02/2020   Lab Results  Component Value Date   NA 136 11/02/2020   K 3.8 11/02/2020   CO2 27 11/02/2020   GLUCOSE 105 (H) 11/02/2020   BUN 16 11/02/2020   CREATININE 0.93 11/02/2020   BILITOT 0.9 11/02/2020   ALKPHOS 50 11/02/2020   AST 28 11/02/2020   ALT 18 11/02/2020   PROT 6.6 11/02/2020   ALBUMIN 3.8 11/02/2020   CALCIUM 9.1 11/02/2020   ANIONGAP 8 11/02/2020   GFR 71.82 10/30/2018    Lab Results  Component Value Date   CHOL 161 10/30/2018   Lab Results  Component Value Date   HDL 38.00 (L) 10/30/2018   Lab Results  Component Value Date   LDLCALC 101 (H) 10/30/2018   Lab Results  Component Value Date   TRIG 108.0 10/30/2018   Lab Results  Component Value Date   CHOLHDL 4 10/30/2018   Lab Results  Component Value Date   HGBA1C 6.0 10/18/2008      Assessment & Plan:   #1 low blood pressure.  Patient asymptomatic.  Blood pressure checked by me right arm seated 94/60 and standing 90/60. -We suggest that he hold his HCTZ until follow-up with cardiology and go from there.  He actually has follow-up with cardiology tomorrow and can reassess blood pressure at that point -Check basic metabolic panel  #2 hypothyroidism.  Patient on levothyroxine 75 mcg daily -Recheck TSH  #3 hyperlipidemia treated with atorvastatin. -Recheck lipid panel  #4 history of atrial fibrillation.  Appears to be in sinus rhythm at this time.  Patient on Eliquis.  Follow-up with cardiology tomorrow.  #5 balance issues.  Suspect largely related to his peripheral neuropathy.  He seems like he has fairly good balance with walking and ambulating here in the office today.  We did discuss possible physical therapy for balance training but at this point he declines.   No orders of the defined types were placed in this encounter.   Follow-up: Return in about 6 months (around 11/11/2021).    Carolann Littler, MD

## 2021-05-15 ENCOUNTER — Ambulatory Visit (INDEPENDENT_AMBULATORY_CARE_PROVIDER_SITE_OTHER): Payer: Medicare HMO | Admitting: Cardiology

## 2021-05-15 ENCOUNTER — Other Ambulatory Visit: Payer: Self-pay

## 2021-05-15 ENCOUNTER — Encounter: Payer: Self-pay | Admitting: Cardiology

## 2021-05-15 ENCOUNTER — Other Ambulatory Visit (INDEPENDENT_AMBULATORY_CARE_PROVIDER_SITE_OTHER): Payer: Medicare HMO

## 2021-05-15 VITALS — BP 80/60 | HR 61 | Ht 76.0 in | Wt 184.0 lb

## 2021-05-15 DIAGNOSIS — R55 Syncope and collapse: Secondary | ICD-10-CM | POA: Diagnosis not present

## 2021-05-15 DIAGNOSIS — I10 Essential (primary) hypertension: Secondary | ICD-10-CM

## 2021-05-15 DIAGNOSIS — E7800 Pure hypercholesterolemia, unspecified: Secondary | ICD-10-CM | POA: Diagnosis not present

## 2021-05-15 DIAGNOSIS — I4891 Unspecified atrial fibrillation: Secondary | ICD-10-CM | POA: Diagnosis not present

## 2021-05-15 DIAGNOSIS — Z950 Presence of cardiac pacemaker: Secondary | ICD-10-CM

## 2021-05-15 DIAGNOSIS — E038 Other specified hypothyroidism: Secondary | ICD-10-CM

## 2021-05-15 LAB — LIPID PANEL
Cholesterol: 137 mg/dL (ref 0–200)
HDL: 47.5 mg/dL (ref >39.00)
LDL Cholesterol: 77 mg/dL (ref 0–99)
NonHDL: 89.81
Total CHOL/HDL Ratio: 3
Triglycerides: 65 mg/dL (ref 0.0–149.0)
VLDL: 13 mg/dL (ref 0.0–40.0)

## 2021-05-15 LAB — BASIC METABOLIC PANEL
BUN: 28 mg/dL — ABNORMAL HIGH (ref 6–23)
CO2: 27 mEq/L (ref 19–32)
Calcium: 9.3 mg/dL (ref 8.4–10.5)
Chloride: 98 mEq/L (ref 96–112)
Creatinine, Ser: 1.33 mg/dL (ref 0.40–1.50)
GFR: 48 mL/min — ABNORMAL LOW (ref >60.00)
Glucose, Bld: 124 mg/dL — ABNORMAL HIGH (ref 70–99)
Potassium: 3.9 mEq/L (ref 3.5–5.1)
Sodium: 132 mEq/L — ABNORMAL LOW (ref 135–145)

## 2021-05-15 LAB — TSH: TSH: 2.59 u[IU]/mL (ref 0.35–5.50)

## 2021-05-15 NOTE — Patient Instructions (Addendum)
Stop taking amlodipine and HCTZ  Continue your other therapy  Try taking daily supplement of Psyllium (metamucil) for bowels.

## 2021-05-17 ENCOUNTER — Other Ambulatory Visit: Payer: Self-pay | Admitting: Neurology

## 2021-05-21 ENCOUNTER — Telehealth: Payer: Self-pay | Admitting: Family Medicine

## 2021-05-21 NOTE — Telephone Encounter (Signed)
Patient called in regarding RX. Patient states that he has appointment with Dr Elease Hashimoto tomorrow at 2:45 pm. In regards to RX Alprazolam. He wants Provider to know that the refill did go through to his Pharmacy.. however patient  states that he and Burchette had discussion of possible change of this medication. Patient wants to know if he should pick up RX or should he come in and have the meeting with Provider tomorrow at 2:45 pm...  He is requesting a call back

## 2021-05-22 ENCOUNTER — Encounter: Payer: Self-pay | Admitting: Family Medicine

## 2021-05-22 ENCOUNTER — Ambulatory Visit (INDEPENDENT_AMBULATORY_CARE_PROVIDER_SITE_OTHER): Payer: Medicare HMO | Admitting: Family Medicine

## 2021-05-22 ENCOUNTER — Telehealth: Payer: Self-pay | Admitting: Neurology

## 2021-05-22 VITALS — BP 108/60 | HR 67 | Temp 97.7°F | Ht 76.0 in | Wt 199.4 lb

## 2021-05-22 DIAGNOSIS — Z9181 History of falling: Secondary | ICD-10-CM

## 2021-05-22 DIAGNOSIS — I10 Essential (primary) hypertension: Secondary | ICD-10-CM

## 2021-05-22 DIAGNOSIS — I959 Hypotension, unspecified: Secondary | ICD-10-CM

## 2021-05-22 DIAGNOSIS — G250 Essential tremor: Secondary | ICD-10-CM

## 2021-05-22 NOTE — Telephone Encounter (Signed)
Got a note from PCP, patient requesting refills of Xanax, Dr. Jannifer Franklin started for tremor. Daniel Everett is very well known to me. When I last saw him, he wasn't sure how much the Xanax was helping.  I think we should continue the Topamax, try to wean down the Xanax 0.5 mg tablet twice daily, by taking 1/2 tablet twice daily for 2 weeks, then 1/2 tablet daily for 2 weeks then stop. I have discussed that no medication is likely to make much difference in the tremor. Need to consider his age, gait, with benzo use.

## 2021-05-22 NOTE — Progress Notes (Signed)
Established Patient Office Visit  Subjective:  Patient ID: Daniel Everett, male    DOB: October 14, 1931  Age: 88 y.o. MRN: [id number]  CC:  Chief Complaint  Patient presents with   Follow-up    HPI Daniel Everett presents for the following items  We just seen last week and had hypotension.  We advised holding HCTZ.  He saw cardiology the next day and was advised to hold HCTZ and amlodipine.  He has been asymptomatic all along.  No recent falls but he does feel like he has very poor balance.  He does remain on lisinopril.  Blood pressure improved today.  He has been conscious to try to drink more fluids and eat more.  No orthostatic symptoms.  Patient had brought a question regarding refills of alprazolam.  This was started by neurology and apparently by Dr. Jannifer Franklin who just retired.  He was on low-dose of 0.5 mg twice daily and apparently this was started for essential tremor.  He cannot take Mysoline because of Eliquis.  Has bradycardia at baseline.  Had been on Klonopin but did not see much benefit.  Is not clear in speaking with patient whether he has gotten clinical benefit from alprazolam for the tremor.  His tremor is really not particularly severe except for with certain activities such as buttoning shirts.  He is able to eat and that sort of thing without much difficulty. He does have some balance issues at baseline and fairly high risk for falls and we had expressed some concerns regarding benzodiazepine use at his age.  He lives alone.  Past Medical History:  Diagnosis Date   Allergic rhinitis due to pollen 06/07/2008   Atrial fibrillation ([place])    Benign essential HTN    COLONIC POLYPS, HX OF 10/28/2006   DIVERTICULOSIS, COLON 11/27/2006   Gait abnormality 03/02/2018   GEN OSTEOARTHROSIS INVOLVING MULTIPLE SITES 01/03/2010   HYPERGLYCEMIA 10/18/2008   HYPOTHYROIDISM 11/27/2006   NEOPLASM, MALIGNANT, PROSTATE, HX OF, S/P TURP 05/28/2007   OSTEOARTHRITIS 11/27/2006   Peripheral neuropathy  04/08/2018   Presence of permanent cardiac pacemaker 08/20/2018   WEIGHT LOSS, RECENT 11/27/2006    Past Surgical History:  Procedure Laterality Date   arthroscopy rt knee     EXCISION MASS NECK Left 01/29/2019   Procedure: 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;  Surgeon: [name], MD;  Location: [facility];  Service: ENT;  Laterality: Left;   HYDROCELE EXCISION / REPAIR     INSERT / REPLACE / REMOVE PACEMAKER  08/20/2018   PACEMAKER IMPLANT N/A 08/20/2018   Procedure: PACEMAKER IMPLANT;  Surgeon: Constance Haw, MD;  Location: [facility];  Service: Cardiovascular;  Laterality: N/A;   PROSTATE SURGERY     TURP   ROTATOR CUFF REPAIR      Family History  Problem Relation Age of Onset   Coronary artery disease Mother    Heart disease Mother    Pancreatic cancer Father    Cancer Father    Tremor Sister    Colon cancer Sister        2021   Esophageal cancer Neg Hx    Stomach cancer Neg Hx    Rectal cancer Neg Hx     Social History   Socioeconomic History   Marital status: Widowed    Spouse name: Not on file   Number of children: 3   Years of education: 4 years college   Highest education level: Bachelor's degree (e.g.,  BA, AB, BS)  Occupational History   Occupation: retired  Tobacco Use   Smoking status: Never   Smokeless tobacco: Never  Vaping Use   Vaping Use: Never used  Substance and Sexual Activity   Alcohol use: No   Drug use: No   Sexual activity: Not Currently  Other Topics Concern   Not on file  Social History Narrative   Lives alone   Caffeine use: very little   Right handed       3 children    2 in [place] and one in near [facility] graduate and played basketball there   Graduated in 59    Social Determinants of Health   Financial Resource Strain: Low Risk    Difficulty of Paying Living Expenses: Not hard at all  Food Insecurity: No Food Insecurity   Worried About Charity fundraiser in the Last Year:  Never true   Arboriculturist in the Last Year: Never true  Transportation Needs: No Transportation Needs   Lack of Transportation (Medical): No   Lack of Transportation (Non-Medical): No  Physical Activity: Inactive   Days of Exercise per Week: 0 days   Minutes of Exercise per Session: 0 min  Stress: No Stress Concern Present   Feeling of Stress : Not at all  Social Connections: Moderately Integrated   Frequency of Communication with Friends and Family: More than three times a week   Frequency of Social Gatherings with Friends and Family: More than three times a week   Attends Religious Services: More than 4 times per year   Active Member of [employer] or Organizations: Yes   Attends Archivist Meetings: More than 4 times per year   Marital Status: Widowed  Human resources officer Violence: Not At Risk   Fear of Current or Ex-Partner: No   Emotionally Abused: No   Physically Abused: No   Sexually Abused: No    Outpatient Medications Prior to Visit  Medication Sig Dispense Refill   acetaminophen (TYLENOL) 650 MG CR tablet Take 650 mg by mouth 2 (two) times a day.     ALPRAZolam (XANAX) 0.5 MG tablet TAKE 1 TABLET BY MOUTH 2 TIMES DAILY. 60 tablet 0   apixaban (ELIQUIS) 5 MG TABS tablet Take 1 tablet (5 mg total) by mouth 2 (two) times daily. 180 tablet 3   atorvastatin (LIPITOR) 20 MG tablet TAKE 1 TABLET DAILY 90 tablet 1   Cholecalciferol (VITAMIN D3) 50 MCG (2000 UT) TABS Take 2,000 Units by mouth daily.     Ciclopirox 0.77 % gel Apply 1 application topically daily as needed.     diclofenac Sodium (VOLTAREN) 1 % GEL Apply topically.     finasteride (PROSCAR) 5 MG tablet TAKE 1 TABLET DAILY 90 tablet 1   levothyroxine (SYNTHROID) 75 MCG tablet TAKE 1 TABLET DAILY 90 tablet 1   lisinopril (ZESTRIL) 20 MG tablet TAKE 1 TABLET DAILY 90 tablet 1   Multiple Vitamin (MULTIVITAMIN WITH MINERALS) TABS tablet Take 1 tablet by mouth daily. Senior Plus Multivitamin     omeprazole (PRILOSEC)  20 MG capsule Take 1 capsule (20 mg total) by mouth daily. 90 capsule 3   pyridOXINE (VITAMIN B-6) 100 MG tablet Take 100 mg by mouth daily.     timolol (TIMOPTIC) 0.5 % ophthalmic solution Place 1 drop into the left eye 2 (two) times daily.     topiramate (TOPAMAX) 25 MG tablet 1 TABLET TWICE DAILY 180 tablet 1  No facility-administered medications prior to visit.    No Known Allergies  ROS Review of Systems  Constitutional:  Negative for chills and fever.  Respiratory:  Negative for shortness of breath.   Cardiovascular:  Negative for chest pain.  Gastrointestinal:  Negative for abdominal pain.  Neurological:  Positive for tremors. Negative for syncope and headaches.  Psychiatric/Behavioral:  Negative for confusion.      Objective:    Physical Exam Vitals reviewed.  Cardiovascular:     Rate and Rhythm: Normal rate.  Pulmonary:     Effort: Pulmonary effort is normal.     Breath sounds: Normal breath sounds.  Neurological:     General: No focal deficit present.     Mental Status: He is alert.     Comments: Patient has tremor involving upper extremities which is an intention tremor.    BP 108/60 (BP Location: Left Arm, Cuff Size: Normal)    Pulse 67    Temp 97.7 F (36.5 C) (Oral)    Ht 6\' 4"  (1.93 m)    Wt 199 lb 6.4 oz (90.4 kg)    SpO2 98%    BMI 24.27 kg/m  Wt Readings from Last 3 Encounters:  05/22/21 199 lb 6.4 oz (90.4 kg)  05/15/21 184 lb (83.5 kg)  05/14/21 188 lb (85.3 kg)     There are no preventive care reminders to display for this patient.  There are no preventive care reminders to display for this patient.  Lab Results  Component Value Date   TSH 2.59 05/15/2021   Lab Results  Component Value Date   WBC 5.1 11/02/2020   HGB 13.8 11/02/2020   HCT 39.8 11/02/2020   MCV 93.2 11/02/2020   PLT 169 11/02/2020   Lab Results  Component Value Date   NA 132 (L) 05/15/2021   K 3.9 05/15/2021   CO2 27 05/15/2021   GLUCOSE 124 (H) 05/15/2021   BUN  28 (H) 05/15/2021   CREATININE 1.33 05/15/2021   BILITOT 0.9 11/02/2020   ALKPHOS 50 11/02/2020   AST 28 11/02/2020   ALT 18 11/02/2020   PROT 6.6 11/02/2020   ALBUMIN 3.8 11/02/2020   CALCIUM 9.3 05/15/2021   ANIONGAP 8 11/02/2020   GFR 48.00 (L) 05/15/2021   Lab Results  Component Value Date   CHOL 137 05/15/2021   Lab Results  Component Value Date   HDL 47.50 05/15/2021   Lab Results  Component Value Date   LDLCALC 77 05/15/2021   Lab Results  Component Value Date   TRIG 65.0 05/15/2021   Lab Results  Component Value Date   CHOLHDL 3 05/15/2021   Lab Results  Component Value Date   HGBA1C 6.0 10/18/2008      Assessment & Plan:   #1 recent hypotension.  Improved today with blood pressure seated left arm 108/60.  We reiterated importance of holding his HCTZ and amlodipine.  Stay well-hydrated.  Follow-up immediately for any dizziness  #2 history of essential tremor.  Patient has been on low-dose alprazolam per neurology and had called recently requesting refills.  I will send a note to neurology for clarification whether he is to stay on this.  We have expressed concerns regarding fall risk with benzodiazepine use in general.  #3 balance problems and moderately high risk of falls.  We referred to physical therapy in the past.  Offered physical therapy again but this point he declines.  No orders of the defined types were placed in this encounter.  Follow-up: No follow-ups on file.    Carolann Littler, MD

## 2021-05-22 NOTE — Patient Instructions (Signed)
Make sure you are OFF the HCTZ and Amlodipine  Be sure to drink plenty of fluids  Follow up for any dizziness  I will send note to neurology regarding the Alprazolam.

## 2021-05-23 NOTE — Telephone Encounter (Signed)
I spoke to the patient. He is agreeable to wean off alprazolam. He wrote the directions down and repeated them back to me correctly. He will call us back for any worsening symptoms.  ?

## 2021-05-28 ENCOUNTER — Other Ambulatory Visit: Payer: Self-pay | Admitting: Cardiology

## 2021-06-15 ENCOUNTER — Other Ambulatory Visit: Payer: Self-pay | Admitting: Cardiology

## 2021-06-19 ENCOUNTER — Ambulatory Visit (INDEPENDENT_AMBULATORY_CARE_PROVIDER_SITE_OTHER): Payer: Medicare HMO

## 2021-06-19 DIAGNOSIS — I4891 Unspecified atrial fibrillation: Secondary | ICD-10-CM | POA: Diagnosis not present

## 2021-06-19 LAB — CUP PACEART REMOTE DEVICE CHECK
Battery Remaining Longevity: 113 mo
Battery Remaining Percentage: 81 %
Battery Voltage: 3.01 V
Brady Statistic RV Percent Paced: 92 %
Date Time Interrogation Session: 20230328020014
Implantable Lead Implant Date: 20200528
Implantable Lead Location: 753860
Implantable Pulse Generator Implant Date: 20200528
Lead Channel Impedance Value: 490 Ohm
Lead Channel Pacing Threshold Amplitude: 0.5 V
Lead Channel Pacing Threshold Pulse Width: 0.5 ms
Lead Channel Sensing Intrinsic Amplitude: 3.7 mV
Lead Channel Setting Pacing Amplitude: 0.75 V
Lead Channel Setting Pacing Pulse Width: 0.5 ms
Lead Channel Setting Sensing Sensitivity: 2 mV
Pulse Gen Model: 1272
Pulse Gen Serial Number: 9116714

## 2021-06-20 ENCOUNTER — Telehealth: Payer: Self-pay | Admitting: Cardiology

## 2021-06-20 MED ORDER — OMEPRAZOLE 20 MG PO CPDR: 20.0000 mg | DELAYED_RELEASE_CAPSULE | Freq: Every day | ORAL | 3 refills | Status: DC

## 2021-06-20 NOTE — Telephone Encounter (Signed)
?*  STAT* If patient is at the pharmacy, call can be transferred to refill team. ? ? ?1. Which medications need to be refilled? (please list name of each medication and dose if known) omeprazole (PRILOSEC) 20 MG capsule ? ?2. Which pharmacy/location (including street and city if local pharmacy) is medication to be sent to? [facility] [place] to Registered [facility] ? ?3. Do they need a 30 day or 90 day supply? 90 with refills  ?

## 2021-06-20 NOTE — Telephone Encounter (Signed)
Refill sent to pharmacy.   

## 2021-06-26 ENCOUNTER — Telehealth: Payer: Self-pay | Admitting: Cardiology

## 2021-06-26 NOTE — Telephone Encounter (Signed)
Spoke to patient he stated he received HCTZ through mail order.Stated he is confused.Advised HCTZ and Amlodipine were stopped at 05/15/21 Dr.Jordan's office visit.Stated he will call mail order and mail back. ?

## 2021-06-26 NOTE — Telephone Encounter (Signed)
New Message: ? ? ? ? ?Please call, question about the medicine he received from his Mail Order RX. ?

## 2021-06-29 NOTE — Progress Notes (Signed)
Remote pacemaker transmission.   

## 2021-06-30 ENCOUNTER — Other Ambulatory Visit: Payer: Self-pay | Admitting: Family Medicine

## 2021-07-25 ENCOUNTER — Telehealth: Payer: Self-pay

## 2021-07-25 ENCOUNTER — Telehealth: Payer: Self-pay | Admitting: Family Medicine

## 2021-07-25 NOTE — Telephone Encounter (Signed)
Pt daughter requests that a nurse calls him to schedule an appointment for him to come in for eval, stating he does not make appointments unless the doctor says they need to see him. She feels this is the only way he will come in. Daughter would like to be in attendance. Can be reached at [phone] ?

## 2021-07-25 NOTE — Telephone Encounter (Signed)
---  Caller states that she is noticing more frequent ?episodes of confusion in her dad. Caller states that the ?episodes started out at 1 time a month but has been ?increasing to weekly and most recently daily, so they ?have been coming on over time. ? ?07/25/2021 10:22:56 AM See HCP within 4 Hours (or PCP triage) Doyle Askew, Deaver, Ronald ? ?Comments ?User: Melene Muller, RN Date/Time Eilene Ghazi Time): 07/25/2021 10:22:55 AM ?Caller states she would like to talk to father before making appt ? ?Referrals ?REFERRED TO PCP OFFICE ?

## 2021-07-25 NOTE — Telephone Encounter (Signed)
Pt has been scheduled for Office visit on 05/09 ?

## 2021-07-25 NOTE — Telephone Encounter (Signed)
Left a message for the pt to return my call.  

## 2021-07-30 DIAGNOSIS — L570 Actinic keratosis: Secondary | ICD-10-CM | POA: Diagnosis not present

## 2021-07-30 DIAGNOSIS — D225 Melanocytic nevi of trunk: Secondary | ICD-10-CM | POA: Diagnosis not present

## 2021-07-30 DIAGNOSIS — L821 Other seborrheic keratosis: Secondary | ICD-10-CM | POA: Diagnosis not present

## 2021-07-30 DIAGNOSIS — D492 Neoplasm of unspecified behavior of bone, soft tissue, and skin: Secondary | ICD-10-CM | POA: Diagnosis not present

## 2021-07-30 DIAGNOSIS — L814 Other melanin hyperpigmentation: Secondary | ICD-10-CM | POA: Diagnosis not present

## 2021-07-30 DIAGNOSIS — L989 Disorder of the skin and subcutaneous tissue, unspecified: Secondary | ICD-10-CM | POA: Diagnosis not present

## 2021-07-31 ENCOUNTER — Encounter: Payer: Self-pay | Admitting: Family Medicine

## 2021-07-31 ENCOUNTER — Ambulatory Visit (INDEPENDENT_AMBULATORY_CARE_PROVIDER_SITE_OTHER): Payer: Medicare HMO | Admitting: Family Medicine

## 2021-07-31 VITALS — BP 120/76 | HR 70 | Temp 98.2°F | Ht 76.0 in | Wt 187.8 lb

## 2021-07-31 DIAGNOSIS — R4189 Other symptoms and signs involving cognitive functions and awareness: Secondary | ICD-10-CM | POA: Diagnosis not present

## 2021-07-31 NOTE — Progress Notes (Signed)
? ?Established Patient Office Visit ? ?Subjective   ?Patient ID: Daniel Everett, male    DOB: 05-06-1931  Age: 88 y.o. MRN: [id number] ? ?Chief Complaint  ?Patient presents with  ? Memory Loss  ? ? ?HPI ? ? ?Here with daughter today to discuss some recent possible cognitive changes.  He has chronic problems including history of atrial fibrillation, hypertension, GERD, hypothyroidism, peripheral neuropathy, essential tremor, osteoarthritis involving multiple joints, hyperlipidemia.  He is widowed.  He still works part-time with very limited driving.  He states he is generally okay with things like driving familiar places but has had some difficulty with operating things at home that he does not use on a regular basis. ? ?Daughter also recently noted that he had TV remote control in the kitchen and was trying to use that in the kitchen.  No reported head injury.  No headaches.  He had some chronic balance difficulties.  He is on Topamax for tremor per neurology but has been on this apparently for some time.  Was recently taken off Xanax.  Reportedly had CT scan through the [place] last December but we have no record.  His daughter did pull it up on her phone and this showed fairly severe age-related atrophy and chronic ischemic microvascular changes. ? ?No recent dysuria.  No fever.  No cough.  No chest pains.  Current medications include Eliquis, atorvastatin, finasteride, levothyroxine, lisinopril, Prilosec, Topamax.  Recent TSH at goal. ? ?Of note, his weight is down approximately 11 pounds from visit here in November.  He feels like his appetite has been poor for some time but no recent changes.  Usually eats 2-3 meals per day but usually fairly small meals. ? ?Past Medical History:  ?Diagnosis Date  ? Allergic rhinitis due to pollen 06/07/2008  ? Atrial fibrillation ([place])   ? Benign essential HTN   ? COLONIC POLYPS, HX OF 10/28/2006  ? DIVERTICULOSIS, COLON 11/27/2006  ? Gait abnormality 03/02/2018  ? GEN OSTEOARTHROSIS  INVOLVING MULTIPLE SITES 01/03/2010  ? HYPERGLYCEMIA 10/18/2008  ? HYPOTHYROIDISM 11/27/2006  ? NEOPLASM, MALIGNANT, PROSTATE, HX OF, S/P TURP 05/28/2007  ? OSTEOARTHRITIS 11/27/2006  ? Peripheral neuropathy 04/08/2018  ? Presence of permanent cardiac pacemaker 08/20/2018  ? WEIGHT LOSS, RECENT 11/27/2006  ? ?Past Surgical History:  ?Procedure Laterality Date  ? arthroscopy rt knee    ? EXCISION MASS NECK Left 01/29/2019  ? 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 HYDROCELE EXCISION / REPAIR    ? INSERT / REPLACE / REMOVE PACEMAKER  08/20/2018  ? PACEMAKER IMPLANT N/A 08/20/2018  ? Procedure: PACEMAKER IMPLANT;  Surgeon: Constance Haw, MD;  Location: [facility];  Service: Cardiovascular;  Laterality: N/A;  ? PROSTATE SURGERY    ? TURP  ? ROTATOR CUFF REPAIR    ? ? reports that he has never smoked. He has never used smokeless tobacco. He reports that he does not drink alcohol and does not use drugs. ?family history includes Cancer in his father; Colon cancer in his sister; Coronary artery disease in his mother; Heart disease in his mother; Pancreatic cancer in his father; Tremor in his sister. ?No Known Allergies ? ?Review of Systems  ?Constitutional:  Positive for weight loss.  ?Respiratory:  Negative for cough and shortness of breath.   ?Cardiovascular:  Negative for chest pain.  ?Gastrointestinal:  Negative for abdominal pain.  ?  Genitourinary:  Negative for dysuria.  ?Neurological:  Negative for seizures and headaches.  ?Psychiatric/Behavioral:  Negative for hallucinations.   ? ?  ?Objective:  ?  ? ?BP 120/76 (BP Location: Left Arm, Patient Position: Sitting, Cuff Size: Normal)   Pulse 70   Temp 98.2 ?F (36.8 ?C) (Oral)   Ht '6\' 4"'$  (1.93 m)   Wt 187 lb 12.8 oz (85.2 kg)   SpO2 99%   BMI 22.86 kg/m?  ? ? ?Physical Exam ?Vitals reviewed.  ?HENT:  ?   Head: Normocephalic and atraumatic.   ?Cardiovascular:  ?   Rate and Rhythm: Normal rate.  ?Pulmonary:  ?   Effort: Pulmonary effort is normal.  ?   Breath sounds: Normal breath sounds.  ?Neurological:  ?   General: No focal deficit present.  ?   Mental Status: He is alert.  ?   Cranial Nerves: No cranial nerve deficit.  ?Psychiatric:  ?   Comments: MMSE 26/30  ? ? ? ?No results found for any visits on 07/31/21. ? ? ? ?The ASCVD Risk score (Arnett DK, et al., 2019) failed to calculate for the following reasons: ?  The 2019 ASCVD risk score is only valid for ages 66 to 14 ? ?  ?Assessment & Plan:  ? ?Problem List Items Addressed This Visit   ?None ?Visit Diagnoses   ? ? Cognitive change    -  Primary  ? Relevant Orders  ? Vitamin B12  ? CMP  ? CBC with Differential/Platelet  ? Urinalysis  ? ?  ?Patient here with daughter with some cognitive changes.  These seem to occur roughly couple weeks ago.  Patient seems to think his memory has been a challenge much longer.  We did discuss multiple possibilities including possible CVA.  He had CT head [facility] reportedly several months ago. ? ?-We discussed MRI brain but with his cardiac pacemaker would not recommend unless approved by cardiology. ?-Start with labs with CBC, CMP, B12 level, urinalysis ?-He actually has scheduled appointment with neuropsychologist in August ? ?No follow-ups on file.  ? ? ?Carolann Littler, MD ? ?

## 2021-08-01 LAB — COMPREHENSIVE METABOLIC PANEL
ALT: 19 U/L (ref 0–53)
AST: 28 U/L (ref 0–37)
Albumin: 4.1 g/dL (ref 3.5–5.2)
Alkaline Phosphatase: 41 U/L (ref 39–117)
BUN: 22 mg/dL (ref 6–23)
CO2: 19 mEq/L (ref 19–32)
Calcium: 9.1 mg/dL (ref 8.4–10.5)
Chloride: 105 mEq/L (ref 96–112)
Creatinine, Ser: 1.07 mg/dL (ref 0.40–1.50)
GFR: 62.22 mL/min (ref >60.00)
Glucose, Bld: 109 mg/dL — ABNORMAL HIGH (ref 70–99)
Potassium: 3.9 mEq/L (ref 3.5–5.1)
Sodium: 135 mEq/L (ref 135–145)
Total Bilirubin: 1 mg/dL (ref 0.2–1.2)
Total Protein: 6.8 g/dL (ref 6.0–8.3)

## 2021-08-01 LAB — URINALYSIS
Hgb urine dipstick: NEGATIVE
Ketones, ur: NEGATIVE
Leukocytes,Ua: NEGATIVE
Nitrite: NEGATIVE
Specific Gravity, Urine: >=1.03 — AB (ref 1.000–1.030)
Urine Glucose: NEGATIVE
Urobilinogen, UA: 1 (ref 0.0–1.0)
pH: 5.5 (ref 5.0–8.0)

## 2021-08-01 LAB — CBC WITH DIFFERENTIAL/PLATELET
Basophils Absolute: 0 10*3/uL (ref 0.0–0.1)
Basophils Relative: 0.8 % (ref 0.0–3.0)
Eosinophils Absolute: 0 10*3/uL (ref 0.0–0.7)
Eosinophils Relative: 0.6 % (ref 0.0–5.0)
HCT: 42.3 % (ref 39.0–52.0)
Hemoglobin: 14.3 g/dL (ref 13.0–17.0)
Lymphocytes Relative: 26.8 % (ref 12.0–46.0)
Lymphs Abs: 1.3 10*3/uL (ref 0.7–4.0)
MCHC: 33.7 g/dL (ref 30.0–36.0)
MCV: 97.4 fl (ref 78.0–100.0)
Monocytes Absolute: 0.4 10*3/uL (ref 0.1–1.0)
Monocytes Relative: 8.5 % (ref 3.0–12.0)
Neutro Abs: 3.1 10*3/uL (ref 1.4–7.7)
Neutrophils Relative %: 63.3 % (ref 43.0–77.0)
Platelets: 187 10*3/uL (ref 150.0–400.0)
RBC: 4.34 Mil/uL (ref 4.22–5.81)
RDW: 13.9 % (ref 11.5–15.5)
WBC: 4.9 10*3/uL (ref 4.0–10.5)

## 2021-08-01 LAB — VITAMIN B12: Vitamin B-12: 465 pg/mL (ref 211–911)

## 2021-08-01 NOTE — Addendum Note (Signed)
Addended by: Rosalyn Gess D on: 08/01/2021 11:32 AM ? ? Modules accepted: Orders ? ?

## 2021-08-29 DIAGNOSIS — H401131 Primary open-angle glaucoma, bilateral, mild stage: Secondary | ICD-10-CM | POA: Diagnosis not present

## 2021-09-01 ENCOUNTER — Other Ambulatory Visit: Payer: Self-pay | Admitting: Neurology

## 2021-09-01 ENCOUNTER — Other Ambulatory Visit: Payer: Self-pay | Admitting: Gastroenterology

## 2021-09-03 NOTE — Telephone Encounter (Signed)
Rx refilled.

## 2021-09-06 ENCOUNTER — Other Ambulatory Visit: Payer: Self-pay | Admitting: Family Medicine

## 2021-09-07 NOTE — Telephone Encounter (Signed)
error 

## 2021-09-11 ENCOUNTER — Ambulatory Visit (INDEPENDENT_AMBULATORY_CARE_PROVIDER_SITE_OTHER): Payer: Medicare HMO

## 2021-09-11 ENCOUNTER — Telehealth: Payer: Self-pay

## 2021-09-11 ENCOUNTER — Encounter: Payer: Self-pay | Admitting: Family Medicine

## 2021-09-11 ENCOUNTER — Ambulatory Visit (INDEPENDENT_AMBULATORY_CARE_PROVIDER_SITE_OTHER): Payer: Medicare HMO | Admitting: Family Medicine

## 2021-09-11 VITALS — BP 110/70 | HR 88 | Temp 97.4°F | Ht 76.0 in | Wt 185.5 lb

## 2021-09-11 DIAGNOSIS — R0781 Pleurodynia: Secondary | ICD-10-CM

## 2021-09-11 DIAGNOSIS — M25512 Pain in left shoulder: Secondary | ICD-10-CM

## 2021-09-11 DIAGNOSIS — I517 Cardiomegaly: Secondary | ICD-10-CM | POA: Diagnosis not present

## 2021-09-11 DIAGNOSIS — K625 Hemorrhage of anus and rectum: Secondary | ICD-10-CM | POA: Diagnosis not present

## 2021-09-11 DIAGNOSIS — M47814 Spondylosis without myelopathy or radiculopathy, thoracic region: Secondary | ICD-10-CM | POA: Diagnosis not present

## 2021-09-11 DIAGNOSIS — M19012 Primary osteoarthritis, left shoulder: Secondary | ICD-10-CM | POA: Diagnosis not present

## 2021-09-11 NOTE — Telephone Encounter (Signed)
--  Caller States he is trying to get some x-ray set up due to a fall 2-3 days ago and get checked out. Caller states he fall on his back. Caller states he is having some pain. His pain is in his left upper back.  09/10/2021 10:11:38 AM See PCP within 24 Hours [place], RN, Joelene Millin  Referrals REFERRED TO PCP OFFICE  Pt has appt with PCP on 09/11/21 at 3:15PM

## 2021-09-11 NOTE — Progress Notes (Signed)
Established Patient Office Visit  Subjective   Patient ID: Daniel Everett, male    DOB: 06/02/1931  Age: 88 y.o. MRN: [id number]  Chief Complaint  Patient presents with   Fall    Patient complains of fall, x3 days,    Back Pain   Hemorrhoids    X3 days,    HPI   Patient is seen accompanied by his daughter with the following issues  He has had some balance problems for some time though he still continues to work part-time.  He reportedly had a fall over the weekend.  Patient does not recall a lot of details.  He knows that he fell and thinks he hit his backside.  He has had some left posterior rib pain and left shoulder pain since then.  Denied any loss of consciousness.  Denied any head injury.  Has had extensive physical therapy in the past for fall reduction and balance training.  No pleuritic pain.  His daughter had noticed some blood in his underwear recently.  At least this appeared to be blood versus soiling but from picture looks very much like bright blood.  He does take Eliquis.  He denies any recent straining.  No pain with bowel movements.  Had colonoscopy 11/21 which showed some internal hemorrhoids.  No history of diverticulosis bleed.  Recent CBC in May was normal.  Appetite and weight stable.  Past Medical History:  Diagnosis Date   Allergic rhinitis due to pollen 06/07/2008   Atrial fibrillation ([place])    Benign essential HTN    COLONIC POLYPS, HX OF 10/28/2006   DIVERTICULOSIS, COLON 11/27/2006   Gait abnormality 03/02/2018   GEN OSTEOARTHROSIS INVOLVING MULTIPLE SITES 01/03/2010   HYPERGLYCEMIA 10/18/2008   HYPOTHYROIDISM 11/27/2006   NEOPLASM, MALIGNANT, PROSTATE, HX OF, S/P TURP 05/28/2007   OSTEOARTHRITIS 11/27/2006   Peripheral neuropathy 04/08/2018   Presence of permanent cardiac pacemaker 08/20/2018   WEIGHT LOSS, RECENT 11/27/2006   Past Surgical History:  Procedure Laterality Date   arthroscopy rt knee     EXCISION MASS NECK Left 01/29/2019   Procedure: WIDE  EXCISION OF NECK MELANOMA WITH SENTINNEL NODE BIOPSY;  Surgeon: [name], MD;  Location: [facility];  Service: ENT;  Laterality: Left;   HYDROCELE EXCISION / REPAIR     INSERT / REPLACE / REMOVE PACEMAKER  08/20/2018   PACEMAKER IMPLANT N/A 08/20/2018   Procedure: PACEMAKER IMPLANT;  Surgeon: Constance Haw, MD;  Location: [facility];  Service: Cardiovascular;  Laterality: N/A;   PROSTATE SURGERY     TURP   ROTATOR CUFF REPAIR      reports that he has never smoked. He has never used smokeless tobacco. He reports that he does not drink alcohol and does not use drugs. family history includes Cancer in his father; Colon cancer in his sister; Coronary artery disease in his mother; Heart disease in his mother; Pancreatic cancer in his father; Tremor in his sister. No Known Allergies  Review of Systems  Constitutional:  Negative for chills, fever and weight loss.  Respiratory:  Negative for shortness of breath.   Cardiovascular:  Negative for chest pain.  Gastrointestinal:  Negative for abdominal pain, constipation, melena, nausea and vomiting.       See HPI  Musculoskeletal:  Positive for back pain.  Neurological:  Negative for dizziness.      Objective:     BP 110/70 (BP Location: Left Arm, Patient Position: Sitting, Cuff Size: Normal)   Pulse 88  Temp (!) 97.4 F (36.3 C) (Oral)   Ht '6\' 4"'$  (1.93 m)   Wt 185 lb 8 oz (84.1 kg)   SpO2 99%   BMI 22.58 kg/m    Physical Exam Vitals reviewed.  Constitutional:      Appearance: Normal appearance.  Cardiovascular:     Rate and Rhythm: Normal rate and regular rhythm.  Pulmonary:     Effort: Pulmonary effort is normal.     Breath sounds: Normal breath sounds.  Genitourinary:    Comments: No external hemorrhoids.  No obvious anal fissure.  Digital exam reveals no rectal mass.  Brown stool. Musculoskeletal:     Comments: Cervical spine nontender.  No tenderness over the thoracic spine.  He does have some tenderness left  posterior rib around T11.  Has decent range of motion left shoulder.  No visible bruising.  Does seem to have some pain with abduction left shoulder  Neurological:     Mental Status: He is alert.      No results found for any visits on 09/11/21.    The ASCVD Risk score (Arnett DK, et al., 2019) failed to calculate for the following reasons:   The 2019 ASCVD risk score is only valid for ages 52 to 54    Assessment & Plan:   #1 status post recent fall with some left shoulder pain and left posterior rib pain.  Obtain x-rays left shoulder and left posterior ribs.  We did discuss the fact that this could be soft tissue injury such as rotator cuff tear.  Try to continue range of motion left shoulder to avoid adhesive capsulitis -He will try Tylenol for pain. -Avoid nonsteroidals with his Eliquis therapy and age. -Pain not severe and would avoid opioids to reduce risk of further falls -We did discuss possible referral for further physical therapy but they wish to observe at this time. -Recommend he consider a cane or other assistive device to reduce fall risk  #2 hematochezia.  No evidence for external hemorrhoids or fissure.  May have internal hemorrhoids.  Recent colonoscopy as above. -Focus on measures to reduce straining with plenty of fluids, fiber, and stool softener if necessary   No follow-ups on file.    Carolann Littler, MD

## 2021-09-14 ENCOUNTER — Emergency Department (HOSPITAL_COMMUNITY): Payer: Medicare HMO

## 2021-09-14 ENCOUNTER — Emergency Department (HOSPITAL_COMMUNITY)
Admission: EM | Admit: 2021-09-14 | Discharge: 2021-09-14 | Disposition: A | Discharge status: Home / Self Care | Payer: Medicare HMO | Attending: Emergency Medicine | Admitting: Emergency Medicine

## 2021-09-14 ENCOUNTER — Other Ambulatory Visit: Payer: Self-pay

## 2021-09-14 ENCOUNTER — Encounter (HOSPITAL_COMMUNITY): Payer: Self-pay | Admitting: Emergency Medicine

## 2021-09-14 DIAGNOSIS — J449 Chronic obstructive pulmonary disease, unspecified: Secondary | ICD-10-CM | POA: Diagnosis not present

## 2021-09-14 DIAGNOSIS — Z7901 Long term (current) use of anticoagulants: Secondary | ICD-10-CM | POA: Insufficient documentation

## 2021-09-14 DIAGNOSIS — I110 Hypertensive heart disease with heart failure: Secondary | ICD-10-CM | POA: Insufficient documentation

## 2021-09-14 DIAGNOSIS — E861 Hypovolemia: Secondary | ICD-10-CM | POA: Diagnosis not present

## 2021-09-14 DIAGNOSIS — R600 Localized edema: Secondary | ICD-10-CM | POA: Insufficient documentation

## 2021-09-14 DIAGNOSIS — I77810 Thoracic aortic ectasia: Secondary | ICD-10-CM | POA: Diagnosis not present

## 2021-09-14 DIAGNOSIS — I5021 Acute systolic (congestive) heart failure: Secondary | ICD-10-CM | POA: Diagnosis not present

## 2021-09-14 DIAGNOSIS — I4891 Unspecified atrial fibrillation: Secondary | ICD-10-CM | POA: Diagnosis not present

## 2021-09-14 DIAGNOSIS — Z79899 Other long term (current) drug therapy: Secondary | ICD-10-CM | POA: Diagnosis not present

## 2021-09-14 DIAGNOSIS — R609 Edema, unspecified: Secondary | ICD-10-CM

## 2021-09-14 DIAGNOSIS — I5020 Unspecified systolic (congestive) heart failure: Secondary | ICD-10-CM

## 2021-09-14 LAB — COMPREHENSIVE METABOLIC PANEL
ALT: 44 U/L (ref 0–44)
AST: 57 U/L — ABNORMAL HIGH (ref 15–41)
Albumin: 4.2 g/dL (ref 3.5–5.0)
Alkaline Phosphatase: 43 U/L (ref 38–126)
Anion gap: 9 (ref 5–15)
BUN: 37 mg/dL — ABNORMAL HIGH (ref 8–23)
CO2: 23 mmol/L (ref 22–32)
Calcium: 9.7 mg/dL (ref 8.9–10.3)
Chloride: 114 mmol/L — ABNORMAL HIGH (ref 98–111)
Creatinine, Ser: 1.21 mg/dL (ref 0.61–1.24)
GFR, Estimated: 58 mL/min — ABNORMAL LOW (ref >60)
Glucose, Bld: 91 mg/dL (ref 70–99)
Potassium: 3.7 mmol/L (ref 3.5–5.1)
Sodium: 146 mmol/L — ABNORMAL HIGH (ref 135–145)
Total Bilirubin: 1.2 mg/dL (ref 0.3–1.2)
Total Protein: 7.2 g/dL (ref 6.5–8.1)

## 2021-09-14 LAB — CBC WITH DIFFERENTIAL/PLATELET
Abs Immature Granulocytes: 0.01 10*3/uL (ref 0.00–0.07)
Basophils Absolute: 0 10*3/uL (ref 0.0–0.1)
Basophils Relative: 0 %
Eosinophils Absolute: 0 10*3/uL (ref 0.0–0.5)
Eosinophils Relative: 0 %
HCT: 47 % (ref 39.0–52.0)
Hemoglobin: 15.1 g/dL (ref 13.0–17.0)
Immature Granulocytes: 0 %
Lymphocytes Relative: 20 %
Lymphs Abs: 1.3 10*3/uL (ref 0.7–4.0)
MCH: 32.9 pg (ref 26.0–34.0)
MCHC: 32.1 g/dL (ref 30.0–36.0)
MCV: 102.4 fL — ABNORMAL HIGH (ref 80.0–100.0)
Monocytes Absolute: 0.5 10*3/uL (ref 0.1–1.0)
Monocytes Relative: 9 %
Neutro Abs: 4.4 10*3/uL (ref 1.7–7.7)
Neutrophils Relative %: 71 %
Platelets: 185 10*3/uL (ref 150–400)
RBC: 4.59 MIL/uL (ref 4.22–5.81)
RDW: 14.7 % (ref 11.5–15.5)
WBC: 6.3 10*3/uL (ref 4.0–10.5)
nRBC: 0 % (ref 0.0–0.2)

## 2021-09-14 LAB — BRAIN NATRIURETIC PEPTIDE: B Natriuretic Peptide: 310.6 pg/mL — ABNORMAL HIGH (ref 0.0–100.0)

## 2021-09-14 MED ORDER — FUROSEMIDE 20 MG PO TABS: 20.0000 mg | ORAL_TABLET | Freq: Every day | ORAL | 0 refills | Status: DC

## 2021-09-14 MED ORDER — FUROSEMIDE 40 MG PO TABS
20.0000 mg | ORAL_TABLET | Freq: Once | ORAL | Status: AC
  Administered 2021-09-14: 20 mg via ORAL
  Filled 2021-09-14: qty 1

## 2021-09-14 NOTE — ED Triage Notes (Signed)
Pt reports leg swelling and redness in legs x a few days. Denies pain

## 2021-09-17 ENCOUNTER — Telehealth: Payer: Self-pay | Admitting: Family Medicine

## 2021-09-17 NOTE — Telephone Encounter (Signed)
Spoke with the patient's daughter, Cherlynn Polo and informed her if she could attend the visit tomorrow with the patient, this can be discussed during the visit and she stated she will do so.

## 2021-09-18 ENCOUNTER — Ambulatory Visit (INDEPENDENT_AMBULATORY_CARE_PROVIDER_SITE_OTHER): Payer: Medicare HMO

## 2021-09-18 ENCOUNTER — Ambulatory Visit (INDEPENDENT_AMBULATORY_CARE_PROVIDER_SITE_OTHER): Payer: Medicare HMO | Admitting: Family Medicine

## 2021-09-18 VITALS — BP 116/70 | HR 64 | Temp 97.6°F | Ht 76.0 in | Wt 186.8 lb

## 2021-09-18 DIAGNOSIS — R600 Localized edema: Secondary | ICD-10-CM

## 2021-09-18 DIAGNOSIS — I4891 Unspecified atrial fibrillation: Secondary | ICD-10-CM

## 2021-09-18 DIAGNOSIS — L03115 Cellulitis of right lower limb: Secondary | ICD-10-CM

## 2021-09-18 MED ORDER — CEPHALEXIN 500 MG PO CAPS: 500.0000 mg | ORAL_CAPSULE | Freq: Three times a day (TID) | ORAL | 0 refills | Status: DC

## 2021-09-18 MED ORDER — FUROSEMIDE 20 MG PO TABS: 20.0000 mg | ORAL_TABLET | Freq: Every day | ORAL | 1 refills | Status: DC

## 2021-09-18 MED ORDER — TRIAMCINOLONE ACETONIDE 0.1 % EX CREA: 1.0000 | TOPICAL_CREAM | Freq: Two times a day (BID) | CUTANEOUS | 1 refills | Status: DC

## 2021-09-19 ENCOUNTER — Telehealth: Payer: Self-pay | Admitting: Cardiology

## 2021-09-19 LAB — CUP PACEART REMOTE DEVICE CHECK
Battery Remaining Longevity: 104 mo
Battery Remaining Percentage: 79 %
Battery Voltage: 3.01 V
Brady Statistic RV Percent Paced: 91 %
Date Time Interrogation Session: 20230628001514
Implantable Lead Implant Date: 20200528
Implantable Lead Location: 753860
Implantable Pulse Generator Implant Date: 20200528
Lead Channel Impedance Value: 480 Ohm
Lead Channel Pacing Threshold Amplitude: 0.625 V
Lead Channel Pacing Threshold Pulse Width: 0.5 ms
Lead Channel Sensing Intrinsic Amplitude: 3 mV
Lead Channel Setting Pacing Amplitude: 0.875
Lead Channel Setting Pacing Pulse Width: 0.5 ms
Lead Channel Setting Sensing Sensitivity: 2 mV
Pulse Gen Model: 1272
Pulse Gen Serial Number: 9116714

## 2021-09-19 NOTE — Telephone Encounter (Signed)
Spoke to patient's daughter Katharine Look she stated she wanted Dr.Jordan to know Daniel Everett went to [facility] ED last Friday 6/23 with increased swelling in both feet.Labs revealed mild CHF.Stated Daniel Everett saw PCP yesterday and he advised he did not need to see cardiologist. Stated Daniel Everett is doing ok he just needs assistance with his care now.Advised I will make Dr.Jordan aware.

## 2021-09-19 NOTE — Telephone Encounter (Signed)
Pt c/o medication issue:  1. Name of Medication: Hydrochlorothiazide  2. How are you currently taking this medication (dosage and times per day)? NA  3. Are you having a reaction (difficulty breathing--STAT)? No  4. What is your medication issue? Daughter states that pt is still getting medication even though it is D/C'd. Mediatation was last received last week on 6/12. Please advise    Daughter would also like to make office aware that pt was seen in the ER so that Dr. Martinique can look at those notes.

## 2021-09-21 DIAGNOSIS — Z0279 Encounter for issue of other medical certificate: Secondary | ICD-10-CM

## 2021-09-29 ENCOUNTER — Other Ambulatory Visit: Payer: Self-pay | Admitting: Cardiology

## 2021-10-01 ENCOUNTER — Other Ambulatory Visit: Payer: Self-pay

## 2021-10-01 ENCOUNTER — Encounter (HOSPITAL_COMMUNITY): Payer: Self-pay | Admitting: Emergency Medicine

## 2021-10-01 ENCOUNTER — Emergency Department (HOSPITAL_COMMUNITY): Payer: Medicare HMO

## 2021-10-01 ENCOUNTER — Observation Stay (HOSPITAL_COMMUNITY)
Admission: EM | Admit: 2021-10-01 | Discharge: 2021-10-05 | Disposition: A | Discharge status: Skilled Nursing Facility | Payer: Medicare HMO | Attending: General Practice | Admitting: General Practice

## 2021-10-01 ENCOUNTER — Telehealth: Payer: Self-pay | Admitting: Family Medicine

## 2021-10-01 DIAGNOSIS — R21 Rash and other nonspecific skin eruption: Secondary | ICD-10-CM | POA: Diagnosis not present

## 2021-10-01 DIAGNOSIS — R600 Localized edema: Secondary | ICD-10-CM

## 2021-10-01 DIAGNOSIS — G9340 Encephalopathy, unspecified: Principal | ICD-10-CM | POA: Diagnosis present

## 2021-10-01 DIAGNOSIS — R410 Disorientation, unspecified: Secondary | ICD-10-CM | POA: Diagnosis not present

## 2021-10-01 DIAGNOSIS — R609 Edema, unspecified: Secondary | ICD-10-CM

## 2021-10-01 DIAGNOSIS — F0390 Unspecified dementia without behavioral disturbance: Secondary | ICD-10-CM | POA: Diagnosis not present

## 2021-10-01 DIAGNOSIS — I110 Hypertensive heart disease with heart failure: Secondary | ICD-10-CM | POA: Insufficient documentation

## 2021-10-01 DIAGNOSIS — I5033 Acute on chronic diastolic (congestive) heart failure: Secondary | ICD-10-CM

## 2021-10-01 DIAGNOSIS — E038 Other specified hypothyroidism: Secondary | ICD-10-CM

## 2021-10-01 DIAGNOSIS — I509 Heart failure, unspecified: Secondary | ICD-10-CM | POA: Diagnosis not present

## 2021-10-01 DIAGNOSIS — R159 Full incontinence of feces: Secondary | ICD-10-CM | POA: Insufficient documentation

## 2021-10-01 DIAGNOSIS — R32 Unspecified urinary incontinence: Secondary | ICD-10-CM

## 2021-10-01 DIAGNOSIS — Z7901 Long term (current) use of anticoagulants: Secondary | ICD-10-CM | POA: Diagnosis not present

## 2021-10-01 DIAGNOSIS — F03A0 Unspecified dementia, mild, without behavioral disturbance, psychotic disturbance, mood disturbance, and anxiety: Secondary | ICD-10-CM

## 2021-10-01 DIAGNOSIS — E039 Hypothyroidism, unspecified: Secondary | ICD-10-CM | POA: Diagnosis not present

## 2021-10-01 DIAGNOSIS — R4182 Altered mental status, unspecified: Secondary | ICD-10-CM | POA: Diagnosis not present

## 2021-10-01 DIAGNOSIS — Z79899 Other long term (current) drug therapy: Secondary | ICD-10-CM | POA: Diagnosis not present

## 2021-10-01 DIAGNOSIS — Z8546 Personal history of malignant neoplasm of prostate: Secondary | ICD-10-CM | POA: Insufficient documentation

## 2021-10-01 DIAGNOSIS — I4821 Permanent atrial fibrillation: Secondary | ICD-10-CM | POA: Diagnosis not present

## 2021-10-01 DIAGNOSIS — Z950 Presence of cardiac pacemaker: Secondary | ICD-10-CM | POA: Diagnosis not present

## 2021-10-01 DIAGNOSIS — R2681 Unsteadiness on feet: Secondary | ICD-10-CM

## 2021-10-01 DIAGNOSIS — Z20822 Contact with and (suspected) exposure to covid-19: Secondary | ICD-10-CM | POA: Diagnosis not present

## 2021-10-01 DIAGNOSIS — R2689 Other abnormalities of gait and mobility: Secondary | ICD-10-CM | POA: Insufficient documentation

## 2021-10-01 DIAGNOSIS — M7989 Other specified soft tissue disorders: Secondary | ICD-10-CM | POA: Diagnosis present

## 2021-10-01 HISTORY — DX: Encephalopathy, unspecified: G93.40

## 2021-10-01 LAB — CBC
HCT: 48.1 % (ref 39.0–52.0)
Hemoglobin: 15.6 g/dL (ref 13.0–17.0)
MCH: 33.1 pg (ref 26.0–34.0)
MCHC: 32.4 g/dL (ref 30.0–36.0)
MCV: 102.1 fL — ABNORMAL HIGH (ref 80.0–100.0)
Platelets: 225 10*3/uL (ref 150–400)
RBC: 4.71 MIL/uL (ref 4.22–5.81)
RDW: 14.8 % (ref 11.5–15.5)
WBC: 6.1 10*3/uL (ref 4.0–10.5)
nRBC: 0 % (ref 0.0–0.2)

## 2021-10-01 LAB — URINALYSIS, ROUTINE W REFLEX MICROSCOPIC
Bilirubin Urine: NEGATIVE
Glucose, UA: NEGATIVE mg/dL
Hgb urine dipstick: NEGATIVE
Ketones, ur: NEGATIVE mg/dL
Leukocytes,Ua: NEGATIVE
Nitrite: NEGATIVE
Protein, ur: NEGATIVE mg/dL
Specific Gravity, Urine: 1.011 (ref 1.005–1.030)
pH: 6 (ref 5.0–8.0)

## 2021-10-01 LAB — COMPREHENSIVE METABOLIC PANEL
ALT: 47 U/L — ABNORMAL HIGH (ref 0–44)
AST: 46 U/L — ABNORMAL HIGH (ref 15–41)
Albumin: 3.7 g/dL (ref 3.5–5.0)
Alkaline Phosphatase: 58 U/L (ref 38–126)
Anion gap: 7 (ref 5–15)
BUN: 21 mg/dL (ref 8–23)
CO2: 26 mmol/L (ref 22–32)
Calcium: 8.9 mg/dL (ref 8.9–10.3)
Chloride: 106 mmol/L (ref 98–111)
Creatinine, Ser: 1.05 mg/dL (ref 0.61–1.24)
GFR, Estimated: >60 mL/min (ref >60)
Glucose, Bld: 106 mg/dL — ABNORMAL HIGH (ref 70–99)
Potassium: 3.7 mmol/L (ref 3.5–5.1)
Sodium: 139 mmol/L (ref 135–145)
Total Bilirubin: 1.1 mg/dL (ref 0.3–1.2)
Total Protein: 6.8 g/dL (ref 6.5–8.1)

## 2021-10-01 LAB — BRAIN NATRIURETIC PEPTIDE: B Natriuretic Peptide: 257.2 pg/mL — ABNORMAL HIGH (ref 0.0–100.0)

## 2021-10-01 LAB — SARS CORONAVIRUS 2 BY RT PCR: SARS Coronavirus 2 by RT PCR: NEGATIVE

## 2021-10-01 LAB — TSH: TSH: 3.693 u[IU]/mL (ref 0.350–4.500)

## 2021-10-01 LAB — TROPONIN I (HIGH SENSITIVITY)
Troponin I (High Sensitivity): 21 ng/L — ABNORMAL HIGH (ref <18)
Troponin I (High Sensitivity): 20 ng/L — ABNORMAL HIGH (ref <18)

## 2021-10-01 LAB — AMMONIA: Ammonia: <10 umol/L (ref 9–35)

## 2021-10-01 MED ORDER — FUROSEMIDE 10 MG/ML IJ SOLN
20.0000 mg | Freq: Once | INTRAMUSCULAR | Status: AC
  Administered 2021-10-01: 20 mg via INTRAVENOUS
  Filled 2021-10-01: qty 4

## 2021-10-01 NOTE — ED Provider Notes (Incomplete)
[facility] DEPT Provider Note   CSN: [id number] Arrival date & time: 10/01/21  1141     History  Chief Complaint  Patient presents with  . Leg Swelling  . Incotinence   . Altered Mental Status    Daniel Everett is a 88 y.o. male.   Altered Mental Status   88 year old male with history significant for hypothyroidism, diverticulosis, osteoarthritis, prostate cancer status post TURP, HTN, atrial fibrillation, permanent cardiac pacemaker present ([employer]) who presents to the emergency department with a gradual cognitive decline over the past few weeks, increasing forgetfulness, bowel or bladder incontinence which is new with increasing difficulty with ambulation.  Patient recently diagnosed with CHF and is on Lasix but has had increasing lower extremity edema bilaterally.  He denies any chest pain or shortness of breath.  He has had increasing weeping of skin with breakdown and wounds.  He denies any back pain.  He denies any focal weakness in the limbs.  No facial droop, dysarthria, dysphagia. Symptoms have been ongoing for days.   Patient has previously been on Keflex for 7-day course due to concern for cellulitis of the right lower extremity.   Home Medications Prior to Admission medications   Medication Sig Start Date End Date Taking? Authorizing Provider  acetaminophen (TYLENOL) 650 MG CR tablet Take 650 mg by mouth 2 (two) times a day.    [provider]  ALPRAZolam (XANAX) 0.5 MG tablet TAKE 1 TABLET BY MOUTH 2 TIMES DAILY. 05/17/21   Marcial Pacas, MD  apixaban (ELIQUIS) 5 MG TABS tablet Take 1 tablet (5 mg total) by mouth 2 (two) times daily. 05/15/17   Martinique, Peter M, MD  atorvastatin (LIPITOR) 20 MG tablet TAKE 1 TABLET DAILY 06/15/21   Martinique, Peter M, MD  cephALEXin (KEFLEX) 500 MG capsule Take 1 capsule (500 mg total) by mouth 3 (three) times daily. 09/18/21   Burchette, Alinda Sierras, MD  Cholecalciferol (VITAMIN D3) 50 MCG (2000 UT) TABS  Take 2,000 Units by mouth daily.    [provider]  Ciclopirox 0.77 % gel Apply 1 application topically daily as needed. 01/29/21   [provider]  diclofenac Sodium (VOLTAREN) 1 % GEL Apply topically.    [provider]  finasteride (PROSCAR) 5 MG tablet TAKE 1 TABLET DAILY 09/06/21   Burchette, Alinda Sierras, MD  furosemide (LASIX) 20 MG tablet Take 1 tablet (20 mg total) by mouth daily. 09/18/21   Burchette, Alinda Sierras, MD  levothyroxine (SYNTHROID) 75 MCG tablet TAKE 1 TABLET DAILY 07/02/21   Burchette, Alinda Sierras, MD  lisinopril (ZESTRIL) 20 MG tablet TAKE 1 TABLET DAILY 10/01/21   Martinique, Peter M, MD  Multiple Vitamin (MULTIVITAMIN WITH MINERALS) TABS tablet Take 1 tablet by mouth daily. Senior Plus Multivitamin    [provider]  omeprazole (PRILOSEC) 20 MG capsule Take 1 capsule (20 mg total) by mouth daily. 06/20/21   Martinique, Peter M, MD  pyridOXINE (VITAMIN B-6) 100 MG tablet Take 100 mg by mouth daily.    [provider]  timolol (TIMOPTIC) 0.5 % ophthalmic solution Place 1 drop into the left eye 2 (two) times daily.    [provider]  topiramate (TOPAMAX) 25 MG tablet TAKE 1 TABLET BY MOUTH TWICE A DAY 09/03/21   Suzzanne Cloud, NP  triamcinolone cream (KENALOG) 0.1 % Apply 1 Application topically 2 (two) times daily. 09/18/21   Burchette, Alinda Sierras, MD      Allergies    Patient  has no known allergies.    Review of Systems   Review of Systems  Physical Exam Updated Vital Signs BP 125/90 (BP Location: Right Arm)   Pulse 67   Temp 98.5 F (36.9 C) (Oral)   Resp 16   SpO2 100%  Physical Exam Vitals and nursing note reviewed.  Constitutional:      General: He is not in acute distress.    Appearance: He is well-developed.  HENT:     Head: Normocephalic and atraumatic.  Eyes:     Conjunctiva/sclera: Conjunctivae normal.  Cardiovascular:     Rate and Rhythm: Normal rate and regular rhythm.     Heart sounds: No murmur heard. Pulmonary:      Effort: Pulmonary effort is normal. No respiratory distress.     Breath sounds: Normal breath sounds.  Abdominal:     Palpations: Abdomen is soft.     Tenderness: There is no abdominal tenderness.  Musculoskeletal:        General: Swelling present.     Cervical back: Neck supple.     Right lower leg: Edema present.     Left lower leg: Edema present.     Comments: Bilateral 3+ LE edema with weeping skin breakdown  Skin:    General: Skin is warm and dry.     Capillary Refill: Capillary refill takes less than 2 seconds.  Neurological:     General: No focal deficit present.     Mental Status: He is alert and oriented to person, place, and time. Mental status is at baseline.     Comments: No cranial nerve deficit, moving all four extremities with 5/5 strength and intact sensation  Psychiatric:        Mood and Affect: Mood normal.     ED Results / Procedures / Treatments   Labs (all labs ordered are listed, but only abnormal results are displayed) Labs Reviewed  COMPREHENSIVE METABOLIC PANEL - Abnormal; Notable for the following components:      Result Value   Glucose, Bld 106 (*)    AST 46 (*)    ALT 47 (*)    All other components within normal limits  CBC - Abnormal; Notable for the following components:   MCV 102.1 (*)    All other components within normal limits  AMMONIA  TSH  URINALYSIS, ROUTINE W REFLEX MICROSCOPIC    EKG EKG Interpretation  Date/Time:  Monday October 01 2021 13:00:18 EDT Ventricular Rate:  60 PR Interval:    QRS Duration: 150 QT Interval:  463 QTC Calculation: 463 R Axis:   269 Text Interpretation: Electronic ventricular pacemaker since last tracing no significant change Confirmed by Daleen Bo [phone]) on 10/01/2021 1:41:33 PM  Radiology CT HEAD WO CONTRAST (5MM)  Result Date: 10/01/2021 CLINICAL DATA:  Provided history: Delirium. Additional history provided: Leg swelling, incontinence, altered mental status. EXAM: CT HEAD WITHOUT  CONTRAST TECHNIQUE: Contiguous axial images were obtained from the base of the skull through the vertex without intravenous contrast. RADIATION DOSE REDUCTION: This exam was performed according to the departmental dose-optimization program which includes automated exposure control, adjustment of the mA and/or kV according to patient size and/or use of iterative reconstruction technique. COMPARISON:  No pertinent prior exams available for comparison. FINDINGS: Brain: Mild-to-moderate generalized cerebral atrophy. Mild cerebellar atrophy. Mild patchy and ill-defined hypoattenuation within the cerebral white matter, nonspecific but compatible chronic small vessel ischemic disease. There is no acute intracranial hemorrhage. No demarcated cortical infarct. No extra-axial fluid collection. No evidence  of an intracranial mass. No midline shift. Vascular: No hyperdense vessel.  Atherosclerotic calcifications. Skull: No fracture or aggressive osseous lesion. Sinuses/Orbits: No mass or acute finding within the imaged orbits. Moderate partial opacification of the left maxillary sinus. Mild mucosal thickening within the bilateral ethmoid sinuses. IMPRESSION: 1. No evidence of acute intracranial abnormality. 2. Mild chronic small vessel ischemic changes within the cerebral white matter. 3. Mild-to-moderate cerebral atrophy. 4. Mild cerebellar atrophy. 5. Paranasal sinus disease at the imaged levels, as described. 6. Nonspecific 2.9 x 1.0 cm cutaneous/subcutaneous occipital scalp lesion. Direct visualization recommended. Electronically Signed   By: Kellie Simmering D.O.   On: 10/01/2021 13:04   DG Chest 2 View  Result Date: 10/01/2021 CLINICAL DATA:  Altered mental status, lower extremity swelling EXAM: CHEST - 2 VIEW COMPARISON:  09/14/2021 chest radiograph. FINDINGS: Stable single lead left subclavian pacemaker with lead tip overlying the right ventricular apex. Stable cardiomediastinal silhouette with mild cardiomegaly. No  pneumothorax. No pleural effusion. No pulmonary edema. No acute consolidative airspace disease. Chronic mild eventration of the left hemidiaphragm. IMPRESSION: Stable mild cardiomegaly without pulmonary edema. No acute pulmonary disease. Electronically Signed   By: Ilona Sorrel M.D.   On: 10/01/2021 12:57    Procedures Procedures  {Document cardiac monitor, telemetry assessment procedure when appropriate:1}  Medications Ordered in ED Medications - No data to display  ED Course/ Medical Decision Making/ A&P                           Medical Decision Making Amount and/or Complexity of Data Reviewed Labs: ordered.  Risk Prescription drug management. Decision regarding hospitalization.   ***  {Document critical care time when appropriate:1} {Document review of labs and clinical decision tools ie heart score, Chads2Vasc2 etc:1}  {Document your independent review of radiology images, and any outside records:1} {Document your discussion with family members, caretakers, and with consultants:1} {Document social determinants of health affecting pt's care:1} {Document your decision making why or why not admission, treatments were needed:1} Final Clinical Impression(s) / ED Diagnoses Final diagnoses:  None    Rx / DC Orders ED Discharge Orders     None

## 2021-10-01 NOTE — Telephone Encounter (Signed)
I spoke with receptionist at [facility] and informed her that pt was on the way to ER. Receptionist stated that pt is currently waiting to be seen.

## 2021-10-01 NOTE — Telephone Encounter (Signed)
Pt's daughter on the line requesting to speak with someone on the care team regarding her father. Was advised to monitor his weight, states patient has gained weight in the past 12 days. States he has taken a dramatic turn cognitively, became incontinent, confused, swollen left elbow. Pt has OV 10/03/21.

## 2021-10-01 NOTE — Telephone Encounter (Signed)
I spoke with the pt's daughter and she inquired if a call can be made to ER in regards to admission for the pt, as she does not want staff at ER to discharge him without being seen. Pt's daughter stated they have done this previously for the same issue.

## 2021-10-01 NOTE — ED Triage Notes (Signed)
Pt arrives w/ family. Reports swelling in legs x 2 weeks w/ no improvement. Family also report newly incontinence in the last few days w/ confusion.

## 2021-10-01 NOTE — ED Provider Notes (Signed)
[facility]-EMERGENCY DEPT Provider Note   CSN: [id number] Arrival date & time: 10/01/21  1141     History  Chief Complaint  Patient presents with   Leg Swelling   Incotinence    Altered Mental Status    Daniel Everett is a 88 y.o. male.   Altered Mental Status   88 year old male with history significant for hypothyroidism, diverticulosis, osteoarthritis, prostate cancer status post TURP, HTN, atrial fibrillation, permanent cardiac pacemaker present ([employer]) who presents to the emergency department with a gradual cognitive decline over the past few weeks, increasing forgetfulness, bowel or bladder incontinence which is new with increasing difficulty with ambulation.  Patient recently diagnosed with CHF and is on Lasix but has had increasing lower extremity edema bilaterally.  He denies any chest pain or shortness of breath.  He has had increasing weeping of skin with breakdown and wounds.  He denies any back pain.  He denies any focal weakness in the limbs.  No facial droop, dysarthria, dysphagia.    On chart review, the patient was seen in the emergency department on 09/14/2021 for a similar complaint.  He was then seen by his PCP for progressive bilateral lower extremity edema with some open areas right leg greater than left with some patchy erythema and slight scaling.  He was advised to continue on furosemide 20 mg daily.  He was started on Keflex 500 mg 3 times daily for 7 days for cellulitis of the RLE.  He was advised continued leg elevation.  A home health referral was set up for skin care evaluation and bilateral compression wraps to help control the edema.  He has also had some cognitive changes and had a cognitive decline over the past month.  Family states that he now has had urinary and fecal incontinence over the past 3 days. No new falls or trauma. He had a fall one month ago and endorses left rib/back pain but denies midline back pain through his  cervical, thoracic, and lumbar spine.   Home Medications Prior to Admission medications   Medication Sig Start Date End Date Taking? Authorizing Provider  acetaminophen (TYLENOL) 650 MG CR tablet Take 650 mg by mouth 2 (two) times a day.   Yes [provider]  apixaban (ELIQUIS) 5 MG TABS tablet Take 1 tablet (5 mg total) by mouth 2 (two) times daily. 05/15/17  Yes [place], Peter M, MD  atorvastatin (LIPITOR) 20 MG tablet TAKE 1 TABLET DAILY Patient taking differently: Take 20 mg by mouth every evening. 06/15/21  Yes [place], Peter M, MD  Cholecalciferol (VITAMIN D3) 50 MCG (2000 UT) TABS Take 2,000 Units by mouth daily.   Yes [provider]  finasteride (PROSCAR) 5 MG tablet TAKE 1 TABLET DAILY Patient taking differently: Take 5 mg by mouth daily. 09/06/21  Yes Burchette, Elberta Fortis, MD  furosemide (LASIX) 20 MG tablet Take 1 tablet (20 mg total) by mouth daily. 09/18/21  Yes Burchette, Elberta Fortis, MD  levothyroxine (SYNTHROID) 75 MCG tablet TAKE 1 TABLET DAILY Patient taking differently: Take 75 mcg by mouth daily before breakfast. 07/02/21  Yes Burchette, Elberta Fortis, MD  lisinopril (ZESTRIL) 20 MG tablet TAKE 1 TABLET DAILY Patient taking differently: Take 20 mg by mouth daily. 10/01/21  Yes [place], Peter M, MD  omeprazole (PRILOSEC) 20 MG capsule Take 1 capsule (20 mg total) by mouth daily. 06/20/21  Yes [place], Peter M, MD  pyridOXINE (VITAMIN B-6) 100 MG tablet Take 100 mg by mouth daily.  Yes [provider]  timolol (TIMOPTIC) 0.5 % ophthalmic solution Place 1 drop into the left eye 2 (two) times daily.   Yes [provider]  topiramate (TOPAMAX) 25 MG tablet TAKE 1 TABLET BY MOUTH TWICE A DAY 09/03/21  Yes Glean Salvo, NP  triamcinolone cream (KENALOG) 0.1 % Apply 1 Application topically 2 (two) times daily. Patient taking differently: Apply 1 Application topically 2 (two) times daily. Apply to sores on both legs 09/18/21  Yes Burchette, Elberta Fortis, MD       Allergies    Patient has no known allergies.    Review of Systems   Review of Systems  Physical Exam Updated Vital Signs BP 98/65   Pulse 64   Temp 98 F (36.7 C) (Oral)   Resp 16   Ht 6\' 4"  (1.93 m)   Wt 84.7 kg   SpO2 100%   BMI 22.73 kg/m  Physical Exam Vitals and nursing note reviewed.  Constitutional:      General: He is not in acute distress.    Appearance: He is well-developed.  HENT:     Head: Normocephalic and atraumatic.  Eyes:     Conjunctiva/sclera: Conjunctivae normal.  Cardiovascular:     Rate and Rhythm: Normal rate and regular rhythm.  Pulmonary:     Effort: Pulmonary effort is normal. No respiratory distress.     Breath sounds: Rales present.     Comments: Bibasilar rales Abdominal:     Palpations: Abdomen is soft.     Tenderness: There is no abdominal tenderness.  Musculoskeletal:        General: Swelling present.     Cervical back: Neck supple.     Right lower leg: Edema present.     Left lower leg: Edema present.     Comments: Bilateral 3+ LE edema with weeping skin breakdown.  No tenderness to palpation of the cervical, thoracic or lumbar spine  Skin:    General: Skin is warm and dry.     Capillary Refill: Capillary refill takes less than 2 seconds.  Neurological:     General: No focal deficit present.     Mental Status: He is alert and oriented to person, place, and time. Mental status is at baseline.     Comments: No cranial nerve deficit, moving all four extremities with 5/5 strength and intact sensation  Psychiatric:        Mood and Affect: Mood normal.     ED Results / Procedures / Treatments   Labs (all labs ordered are listed, but only abnormal results are displayed) Labs Reviewed  COMPREHENSIVE METABOLIC PANEL - Abnormal; Notable for the following components:      Result Value   Glucose, Bld 106 (*)    AST 46 (*)    ALT 47 (*)    All other components within normal limits  CBC - Abnormal; Notable for the following  components:   MCV 102.1 (*)    All other components within normal limits  BRAIN NATRIURETIC PEPTIDE - Abnormal; Notable for the following components:   B Natriuretic Peptide 257.2 (*)    All other components within normal limits  COMPREHENSIVE METABOLIC PANEL - Abnormal; Notable for the following components:   Calcium 8.5 (*)    Total Protein 5.5 (*)    Albumin 3.0 (*)    All other components within normal limits  CBC - Abnormal; Notable for the following components:   RBC 4.06 (*)    MCV 103.2 (*)  All other components within normal limits  TROPONIN I (HIGH SENSITIVITY) - Abnormal; Notable for the following components:   Troponin I (High Sensitivity) 21 (*)    All other components within normal limits  TROPONIN I (HIGH SENSITIVITY) - Abnormal; Notable for the following components:   Troponin I (High Sensitivity) 20 (*)    All other components within normal limits  SARS CORONAVIRUS 2 BY RT PCR  URINALYSIS, ROUTINE W REFLEX MICROSCOPIC  AMMONIA  TSH  RPR  VITAMIN B12    EKG EKG Interpretation  Date/Time:  Monday October 01 2021 13:00:18 EDT Ventricular Rate:  60 PR Interval:    QRS Duration: 150 QT Interval:  463 QTC Calculation: 463 R Axis:   269 Text Interpretation: Electronic ventricular pacemaker since last tracing no significant change Confirmed by Mancel Bale [phone]) on 10/01/2021 1:41:33 PM  Radiology CT HEAD WO CONTRAST ( )  Result Date: 10/01/2021 CLINICAL DATA:  Provided history: Delirium. Additional history provided: Leg swelling, incontinence, altered mental status. EXAM: CT HEAD WITHOUT CONTRAST TECHNIQUE: Contiguous axial images were obtained from the base of the skull through the vertex without intravenous contrast. RADIATION DOSE REDUCTION: This exam was performed according to the departmental dose-optimization program which includes automated exposure control, adjustment of the mA and/or kV according to patient size and/or use of iterative reconstruction  technique. COMPARISON:  No pertinent prior exams available for comparison. FINDINGS: Brain: Mild-to-moderate generalized cerebral atrophy. Mild cerebellar atrophy. Mild patchy and ill-defined hypoattenuation within the cerebral white matter, nonspecific but compatible chronic small vessel ischemic disease. There is no acute intracranial hemorrhage. No demarcated cortical infarct. No extra-axial fluid collection. No evidence of an intracranial mass. No midline shift. Vascular: No hyperdense vessel.  Atherosclerotic calcifications. Skull: No fracture or aggressive osseous lesion. Sinuses/Orbits: No mass or acute finding within the imaged orbits. Moderate partial opacification of the left maxillary sinus. Mild mucosal thickening within the bilateral ethmoid sinuses. IMPRESSION: 1. No evidence of acute intracranial abnormality. 2. Mild chronic small vessel ischemic changes within the cerebral white matter. 3. Mild-to-moderate cerebral atrophy. 4. Mild cerebellar atrophy. 5. Paranasal sinus disease at the imaged levels, as described. 6. Nonspecific 2.9 x 1.0 cm cutaneous/subcutaneous occipital scalp lesion. Direct visualization recommended. Electronically Signed   By: Jackey Loge D.O.   On: 10/01/2021 13:04   DG Chest 2 View  Result Date: 10/01/2021 CLINICAL DATA:  Altered mental status, lower extremity swelling EXAM: CHEST - 2 VIEW COMPARISON:  09/14/2021 chest radiograph. FINDINGS: Stable single lead left subclavian pacemaker with lead tip overlying the right ventricular apex. Stable cardiomediastinal silhouette with mild cardiomegaly. No pneumothorax. No pleural effusion. No pulmonary edema. No acute consolidative airspace disease. Chronic mild eventration of the left hemidiaphragm. IMPRESSION: Stable mild cardiomegaly without pulmonary edema. No acute pulmonary disease. Electronically Signed   By: Delbert Phenix M.D.   On: 10/01/2021 12:57    Procedures Procedures    Medications Ordered in ED Medications   atorvastatin (LIPITOR) tablet 20 mg (has no administration in time range)  lisinopril (ZESTRIL) tablet 20 mg (20 mg Oral Not Given 10/02/21 1121)  levothyroxine (SYNTHROID) tablet 75 mcg (75 mcg Oral Given 10/02/21 0617)  pantoprazole (PROTONIX) EC tablet 40 mg (40 mg Oral Given 10/02/21 1057)  finasteride (PROSCAR) tablet 5 mg (5 mg Oral Given 10/02/21 1052)  apixaban (ELIQUIS) tablet 5 mg (5 mg Oral Given 10/02/21 1057)  topiramate (TOPAMAX) tablet 25 mg (25 mg Oral Given 10/02/21 1053)  timolol (TIMOPTIC) 0.5 % ophthalmic solution 1 drop (1 drop Left  Eye Given 10/02/21 1057)  triamcinolone cream (KENALOG) 0.1 % cream 1 Application (1 Application Topical Given 10/02/21 1057)  sodium chloride flush (NS) 0.9 % injection 3 mL (3 mLs Intravenous Given 10/02/21 1057)  acetaminophen (TYLENOL) tablet 650 mg (has no administration in time range)    Or  acetaminophen (TYLENOL) suppository 650 mg (has no administration in time range)  senna-docusate (Senokot-S) tablet 1 tablet (has no administration in time range)  ondansetron (ZOFRAN) tablet 4 mg (has no administration in time range)    Or  ondansetron (ZOFRAN) injection 4 mg (has no administration in time range)  furosemide (LASIX) injection 20 mg (20 mg Intravenous Given 10/02/21 0617)  furosemide (LASIX) injection 20 mg (20 mg Intravenous Given 10/01/21 1949)    ED Course/ Medical Decision Making/ A&P                           Medical Decision Making Amount and/or Complexity of Data Reviewed Labs: ordered.  Risk Prescription drug management. Decision regarding hospitalization.   88 year old male with history significant for hypothyroidism, diverticulosis, osteoarthritis, prostate cancer status post TURP, HTN, atrial fibrillation, permanent cardiac pacemaker present ([employer]) who presents to the emergency department with a gradual cognitive decline over the past few weeks, increasing forgetfulness, bowel or bladder incontinence which is new  with increasing difficulty with ambulation.  Patient recently diagnosed with CHF and is on Lasix but has had increasing lower extremity edema bilaterally.  He denies any chest pain or shortness of breath.  He has had increasing weeping of skin with breakdown and wounds.  He denies any back pain.  He denies any focal weakness in the limbs.  No facial droop, dysarthria, dysphagia.    On chart review, the patient was seen in the emergency department on 09/14/2021 for a similar complaint.  He was then seen by his PCP for progressive bilateral lower extremity edema with some open areas right leg greater than left with some patchy erythema and slight scaling.  He was advised to continue on furosemide 20 mg daily.  He was started on Keflex 500 mg 3 times daily for 7 days for cellulitis of the RLE.  He was advised continued leg elevation.  A home health referral was set up for skin care evaluation and bilateral compression wraps to help control the edema.  He has also had some cognitive changes and had a cognitive decline over the past month.  Family states that he now has had urinary and fecal incontinence over the past 3 days. No new falls or trauma. He had a fall one month ago and endorses left rib/back pain but denies midline back pain through his cervical, thoracic, and lumbar spine.   On arrival, the patient was afebrile, hemodynamically stable, with a normal neurologic exam.  He has bilateral weeping 3+ lower extremity edema.  He has had a progressive decline in mental status.  Differential diagnosis includes normal pressure hydrocephalus, worsening dementia, CVA, less likely epidural abscess, transverse myelitis, cauda equina syndrome, fracture or spinal cord compression.  The patient has no midline tenderness to palpation on physical exam of the spine.  He is neuro intact although is now having episodes of incontinence.  Considered infection such as urinary tract infection, electrolyte abnormality as etiology of  the patient's presentation.  He has had worsening memory and balance issues.  Work-up initiated to include CT of the head which was without acute intracranial abnormality.  Chest x-ray without intrathoracic  abnormality with no evidence of pulmonary edema, cardiomegaly present.  CT head: IMPRESSION:  1. No evidence of acute intracranial abnormality.  2. Mild chronic small vessel ischemic changes within the cerebral  white matter.  3. Mild-to-moderate cerebral atrophy.  4. Mild cerebellar atrophy.  5. Paranasal sinus disease at the imaged levels, as described.  6. Nonspecific 2.9 x 1.0 cm cutaneous/subcutaneous occipital scalp  lesion. Direct visualization recommended.    Urinalysis performed without evidence of UTI.  CBC without a leukocytosis or anemia, TSH normal, ammonia normal, COVID-19 PCR testing negative, CMP without significant electrolyte abnormality, mild elevation in LFTs to 46 and 47 otherwise no abnormality.  BNP was elevated to 257, slightly downtrending.  Considered CHF versus cirrhosis given the LFT abnormality.  Patient on outpatient diuretics but has had progressively persistent lower extremity edema and orthopnea.  Concern for CHF at this time.  Patient administered 20 mg of Lasix IV.  Initial troponin mildly elevated at 21, repeat downtrending to 20.  Low concern for ACS or PE.   Due to concern for CHF and volume overload, hospitalist medicine was consulted for admission. Dr. Antionette Char of hospitalist medicine accepted the patient in admission.  Update: Review of CT head reveals ventriculomegaly on my evaluation. Presentation of gait instability, dementia, and urinary incontinence is concerning for NPH. Discussed with on-call neurology. This is typically an outpatient workup. Pt to be admitted due to concern for CHF and volume overload, will work with PT inpatient.     Final Clinical Impression(s) / ED Diagnoses Final diagnoses:  Acute on chronic congestive heart failure,  unspecified heart failure type (HCC)  Peripheral edema  Mild dementia without behavioral disturbance, psychotic disturbance, mood disturbance, or anxiety, unspecified dementia type (HCC)  Urinary incontinence, unspecified type  Gait instability    Rx / DC Orders ED Discharge Orders     None         Ernie Avena, MD 10/02/21 1257

## 2021-10-01 NOTE — ED Provider Triage Note (Signed)
Emergency Medicine Provider Triage Evaluation Note  Daniel Everett , a 88 y.o. male  was evaluated in triage.  Pt complains of AMS   Patient brought in by family who is concerned because he has had a gradual cognitive decline over the past few weeks.  Seems that 3 to 4 weeks ago he was quite capable of driving and walking without difficulty and has progressively gradually worsened over the past 3 weeks in regards to mental status.  He seems that he has been more forgetful, has had bowel and bladder incontinence and has had more difficulty walking.  He had a recent fall but does not hurt his head he did hurt his shoulder which was x-rayed and was found to be without any fractures.  He has a history of heart failure and is currently on Lasix but has not had improvement in his lower extremity swelling per family.  He denies any back pain no history of back surgeries denies any focal weakness in any 1 particular limb.  Review of Systems  Positive: Cognitive decline, weakness, bowel and bladder incontinence Negative: Fever  Physical Exam  BP (!) 130/93   Pulse 63   Temp 98 F (36.7 C) (Oral)   Resp 16   SpO2 100%  Gen:   Awake, no distress   Resp:  Normal effort  MSK:   Moves extremities  Other:  Strength is symmetric in bilateral upper and lower extremities.  Diffuse weakness is present.  Smile symmetric, EOMI, PERRLA, sensation intact in all 4 extremities and face  Medical Decision Making  Medically screening exam initiated at 12:41 PM.  Appropriate orders placed.  [name] was informed that the remainder of the evaluation will be completed by another provider, this initial triage assessment does not replace that evaluation, and the importance of remaining in the ED until their evaluation is complete.  CT head, EKG, basic labs plus TSH and ammonia and urine   Tedd Sias, [place] 10/01/21 1243

## 2021-10-01 NOTE — Telephone Encounter (Signed)
Pt's daughter informed of the message and verbalized understanding

## 2021-10-02 ENCOUNTER — Encounter (HOSPITAL_COMMUNITY): Payer: Self-pay | Admitting: Family Medicine

## 2021-10-02 ENCOUNTER — Observation Stay (HOSPITAL_COMMUNITY): Payer: Medicare HMO

## 2021-10-02 DIAGNOSIS — M7989 Other specified soft tissue disorders: Secondary | ICD-10-CM

## 2021-10-02 DIAGNOSIS — I5033 Acute on chronic diastolic (congestive) heart failure: Secondary | ICD-10-CM

## 2021-10-02 DIAGNOSIS — G9340 Encephalopathy, unspecified: Secondary | ICD-10-CM | POA: Diagnosis not present

## 2021-10-02 HISTORY — DX: Other specified soft tissue disorders: M79.89

## 2021-10-02 LAB — COMPREHENSIVE METABOLIC PANEL
ALT: 35 U/L (ref 0–44)
AST: 35 U/L (ref 15–41)
Albumin: 3 g/dL — ABNORMAL LOW (ref 3.5–5.0)
Alkaline Phosphatase: 48 U/L (ref 38–126)
Anion gap: 7 (ref 5–15)
BUN: 21 mg/dL (ref 8–23)
CO2: 26 mmol/L (ref 22–32)
Calcium: 8.5 mg/dL — ABNORMAL LOW (ref 8.9–10.3)
Chloride: 108 mmol/L (ref 98–111)
Creatinine, Ser: 0.91 mg/dL (ref 0.61–1.24)
GFR, Estimated: >60 mL/min (ref >60)
Glucose, Bld: 98 mg/dL (ref 70–99)
Potassium: 3.5 mmol/L (ref 3.5–5.1)
Sodium: 141 mmol/L (ref 135–145)
Total Bilirubin: 1.1 mg/dL (ref 0.3–1.2)
Total Protein: 5.5 g/dL — ABNORMAL LOW (ref 6.5–8.1)

## 2021-10-02 LAB — CBC
HCT: 41.9 % (ref 39.0–52.0)
Hemoglobin: 13.5 g/dL (ref 13.0–17.0)
MCH: 33.3 pg (ref 26.0–34.0)
MCHC: 32.2 g/dL (ref 30.0–36.0)
MCV: 103.2 fL — ABNORMAL HIGH (ref 80.0–100.0)
Platelets: 181 10*3/uL (ref 150–400)
RBC: 4.06 MIL/uL — ABNORMAL LOW (ref 4.22–5.81)
RDW: 14.8 % (ref 11.5–15.5)
WBC: 6.1 10*3/uL (ref 4.0–10.5)
nRBC: 0 % (ref 0.0–0.2)

## 2021-10-02 LAB — VITAMIN B12: Vitamin B-12: 281 pg/mL (ref 180–914)

## 2021-10-02 LAB — RPR: RPR Ser Ql: NONREACTIVE

## 2021-10-02 MED ORDER — LISINOPRIL 20 MG PO TABS
20.0000 mg | ORAL_TABLET | Freq: Every day | ORAL | Status: DC
  Administered 2021-10-03 – 2021-10-05 (×3): 20 mg via ORAL
  Filled 2021-10-02 (×4): qty 1

## 2021-10-02 MED ORDER — ONDANSETRON HCL 4 MG PO TABS: 4.0000 mg | ORAL_TABLET | Freq: Four times a day (QID) | ORAL | Status: DC | PRN

## 2021-10-02 MED ORDER — LEVOTHYROXINE SODIUM 50 MCG PO TABS
75.0000 ug | ORAL_TABLET | Freq: Every day | ORAL | Status: DC
  Administered 2021-10-02 – 2021-10-05 (×4): 75 ug via ORAL
  Filled 2021-10-02 (×4): qty 1

## 2021-10-02 MED ORDER — SENNOSIDES-DOCUSATE SODIUM 8.6-50 MG PO TABS
1.0000 | ORAL_TABLET | Freq: Every evening | ORAL | Status: DC | PRN
  Administered 2021-10-03: 1 via ORAL
  Filled 2021-10-02: qty 1

## 2021-10-02 MED ORDER — FINASTERIDE 5 MG PO TABS
5.0000 mg | ORAL_TABLET | Freq: Every day | ORAL | Status: DC
  Administered 2021-10-02 – 2021-10-05 (×4): 5 mg via ORAL
  Filled 2021-10-02 (×4): qty 1

## 2021-10-02 MED ORDER — PANTOPRAZOLE SODIUM 40 MG PO TBEC
40.0000 mg | DELAYED_RELEASE_TABLET | Freq: Every day | ORAL | Status: DC
  Administered 2021-10-02 – 2021-10-05 (×4): 40 mg via ORAL
  Filled 2021-10-02 (×4): qty 1

## 2021-10-02 MED ORDER — ONDANSETRON HCL 4 MG/2ML IJ SOLN: 4.0000 mg | Freq: Four times a day (QID) | INTRAMUSCULAR | Status: DC | PRN

## 2021-10-02 MED ORDER — SODIUM CHLORIDE 0.9% FLUSH
3.0000 mL | Freq: Two times a day (BID) | INTRAVENOUS | Status: DC
  Administered 2021-10-02 – 2021-10-05 (×8): 3 mL via INTRAVENOUS

## 2021-10-02 MED ORDER — TOPIRAMATE 25 MG PO TABS
25.0000 mg | ORAL_TABLET | Freq: Two times a day (BID) | ORAL | Status: DC
  Administered 2021-10-02 – 2021-10-05 (×8): 25 mg via ORAL
  Filled 2021-10-02 (×8): qty 1

## 2021-10-02 MED ORDER — ACETAMINOPHEN 650 MG RE SUPP: 650.0000 mg | Freq: Four times a day (QID) | RECTAL | Status: DC | PRN

## 2021-10-02 MED ORDER — APIXABAN 5 MG PO TABS
5.0000 mg | ORAL_TABLET | Freq: Two times a day (BID) | ORAL | Status: DC
  Administered 2021-10-02 – 2021-10-05 (×8): 5 mg via ORAL
  Filled 2021-10-02 (×8): qty 1

## 2021-10-02 MED ORDER — TIMOLOL MALEATE 0.5 % OP SOLN
1.0000 [drp] | Freq: Two times a day (BID) | OPHTHALMIC | Status: DC
Start: 2021-10-02 — End: 2021-10-05
  Administered 2021-10-02 – 2021-10-05 (×8): 1 [drp] via OPHTHALMIC
  Filled 2021-10-02: qty 5

## 2021-10-02 MED ORDER — ACETAMINOPHEN 325 MG PO TABS
650.0000 mg | ORAL_TABLET | Freq: Four times a day (QID) | ORAL | Status: DC | PRN
  Administered 2021-10-04: 650 mg via ORAL
  Filled 2021-10-02 (×2): qty 2

## 2021-10-02 MED ORDER — FUROSEMIDE 10 MG/ML IJ SOLN
20.0000 mg | Freq: Two times a day (BID) | INTRAMUSCULAR | Status: DC
  Administered 2021-10-02: 20 mg via INTRAVENOUS
  Filled 2021-10-02: qty 2

## 2021-10-02 MED ORDER — ATORVASTATIN CALCIUM 20 MG PO TABS
20.0000 mg | ORAL_TABLET | Freq: Every evening | ORAL | Status: DC
  Administered 2021-10-02 – 2021-10-04 (×3): 20 mg via ORAL
  Filled 2021-10-02 (×3): qty 1

## 2021-10-02 MED ORDER — TRIAMCINOLONE ACETONIDE 0.1 % EX CREA
1.0000 | TOPICAL_CREAM | Freq: Two times a day (BID) | CUTANEOUS | Status: DC
  Administered 2021-10-02 – 2021-10-05 (×8): 1 via TOPICAL
  Filled 2021-10-02: qty 15

## 2021-10-02 NOTE — H&P (Signed)
History and Physical    Daniel Everett WFU:[id number] DOB: 06/08/1931 DOA: 10/01/2021  PCP: [name], MD   Patient coming from: Home   Chief Complaint: Confusion, leg swelling   HPI: Daniel Everett is a pleasant 88 y.o. male with medical history significant for atrial fibrillation on Eliquis, severe bradycardia with pacemaker, hypothyroidism, hypertension, and chronic diastolic CHF, now presenting to the emergency department for evaluation of confusion and leg swelling.  He is accompanied by 2 daughters who assist with the history.  Patient lives alone, had been working part-time as a Forensic scientist, driving between [place] up until about 3 weeks ago, but has had progressively worsening memory loss over the course of months, and significant decline in and recent weeks and days.  He is now unable to perform his ADLs, leaves his front door open, and does not remember how to shower himself.  Patient has no complaints, denies headache or change in vision, and denies any focal numbness or weakness.  ED Course: Upon arrival to the ED, patient is found to be afebrile and saturating well on room air with stable blood pressure.  Head CT is negative for acute intracranial abnormality.  Chest x-ray with stable mild cardiomegaly but no acute findings.  Chemistry panel with slight elevation in transaminases.  TSH and ammonia are normal.  Urinalysis unremarkable.  BNP is 257 and troponin slightly elevated and flat.  Patient was given 20 mg IV Lasix in the ED.  Review of Systems:  All other systems reviewed and apart from HPI, are negative.  Past Medical History:  Diagnosis Date   Allergic rhinitis due to pollen 06/07/2008   Atrial fibrillation ([place])    Benign essential HTN    COLONIC POLYPS, HX OF 10/28/2006   DIVERTICULOSIS, COLON 11/27/2006   Gait abnormality 03/02/2018   GEN OSTEOARTHROSIS INVOLVING MULTIPLE SITES 01/03/2010   HYPERGLYCEMIA 10/18/2008   HYPOTHYROIDISM 11/27/2006    NEOPLASM, MALIGNANT, PROSTATE, HX OF, S/P TURP 05/28/2007   OSTEOARTHRITIS 11/27/2006   Peripheral neuropathy 04/08/2018   Presence of permanent cardiac pacemaker 08/20/2018   WEIGHT LOSS, RECENT 11/27/2006    Past Surgical History:  Procedure Laterality Date   arthroscopy rt knee     EXCISION MASS NECK Left 01/29/2019   Procedure: 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 He has never used smokeless tobacco. He reports that he does not drink alcohol and does not use drugs.  No Known Allergies  Family History  Problem Relation Age of Onset   Coronary artery disease Mother    Heart disease Mother    Pancreatic cancer Father    Cancer Father    Tremor Sister    Colon cancer Sister        2021   Esophageal cancer Neg Hx    Stomach cancer Neg Hx    Rectal cancer Neg Hx      Prior to Admission medications   Medication Sig Start Date End  Date Taking? Authorizing Provider  acetaminophen (TYLENOL) 650 MG CR tablet Take 650 mg by mouth 2 (two) times a day.   Yes [provider]  apixaban (ELIQUIS) 5 MG TABS tablet Take 1 tablet (5 mg total) by mouth 2 (two) times daily. 05/15/17  Yes Martinique, Peter M, MD  atorvastatin (LIPITOR) 20 MG tablet TAKE 1 TABLET DAILY Patient taking differently: Take 20 mg by mouth every evening. 06/15/21  Yes [place]ique, Peter M, MD  Cholecalciferol (VITAMIN D3) 50 MCG (2000 UT) TABS Take 2,000 Units by mouth daily.   Yes [provider]  finasteride (PROSCAR) 5 MG tablet TAKE 1 TABLET  DAILY Patient taking differently: Take 5 mg by mouth daily. 09/06/21  Yes Burchette, Alinda Sierras, MD  furosemide (LASIX) 20 MG tablet Take 1 tablet (20 mg total) by mouth daily. 09/18/21  Yes Burchette, Alinda Sierras, MD  levothyroxine (SYNTHROID) 75 MCG tablet TAKE 1 TABLET DAILY Patient taking differently: Take 75 mcg by mouth daily before breakfast. 07/02/21  Yes Burchette, Alinda Sierras, MD  lisinopril (ZESTRIL) 20 MG tablet TAKE 1 TABLET DAILY Patient taking differently: Take 20 mg by mouth daily. 10/01/21  Yes Martinique, Peter M, MD  omeprazole (PRILOSEC) 20 MG capsule Take 1 capsule (20 mg total) by mouth daily. 06/20/21  Yes Martin[place], Peter M, MD  pyridOXINE (VITAMIN B-6) 100 MG tablet Take 100 mg by mouth daily.   Yes [provider]  timolol (TIMOPTIC) 0.5 % ophthalmic solution Place 1 drop into the left eye 2 (two) times daily.   Yes [provider]  topiramate (TOPAMAX) 25 MG tablet TAKE 1 TABLET BY MOUTH TWICE A DAY 09/03/21  Yes Suzzanne Cloud, NP  triamcinolone cream (KENALOG) 0.1 % Apply 1 Application topically 2 (two) times daily. Patient taking differently: Apply 1 Application topically 2 (two) times daily. Apply to sores on both legs 09/18/21  Yes [name], MD    Physical Exam: Vitals:   10/01/21 2308 10/02/21 0000 10/02/21 0105 10/02/21 0116  BP: 131/88 131/90 (!) 140/94   Pulse: 60 61 60   Resp: 16  20   Temp:  98.3 F (36.8 C) 97.7 F (36.5 C)   TempSrc:      SpO2: 96% 97% 99%   Weight:    84.7 kg  Height:    '6\' 4"'$  (1.93 m)    Constitutional: NAD, calm  Eyes: PERTLA, lids and conjunctivae normal ENMT: Mucous membranes are moist. Posterior pharynx clear of any exudate or lesions.   Neck: supple, no masses  Respiratory: no wheezing, no crackles. No accessory muscle use.  Cardiovascular: S1 & S2 heard, regular rate and rhythm. Pitting edema involving b/l LEs with serous weeping. No significant JVD. Abdomen: No distension, no tenderness, soft. Bowel sounds  active.  Musculoskeletal: no clubbing / cyanosis. No joint deformity upper and lower extremities.   Skin: no significant rashes, lesions, ulcers. Warm, dry, well-perfused. Neurologic: CN 2-12 grossly intact. Sensation intact. Strength 5/5 in all 4 limbs. Alert and oriented to person, place, and situation.  Psychiatric: Pleasant. Cooperative.    Labs and Imaging on Admission: I have personally reviewed following labs and imaging studies  CBC: Recent Labs  Lab 10/01/21 1309  WBC 6.1  HGB 15.6  HCT 48.1  MCV 102.1*  PLT 428   Basic Metabolic Panel: Recent Labs  Lab 10/01/21 1309  NA 139  K 3.7  CL 106  CO2 26  GLUCOSE 106*  BUN 21  CREATININE 1.05  CALCIUM 8.9  GFR: Estimated Creatinine Clearance: 58.3 mL/min (by C-G formula based on SCr of 1.05 mg/dL). Liver Function Tests: Recent Labs  Lab 10/01/21 1309  AST 46*  ALT 47*  ALKPHOS 58  BILITOT 1.1  PROT 6.8  ALBUMIN 3.7   No results for input(s): "LIPASE", "AMYLASE" in the last 168 hours. Recent Labs  Lab 10/01/21 1309  AMMONIA <10   Coagulation Profile: No results for input(s): "INR", "PROTIME" in the last 168 hours. Cardiac Enzymes: No results for input(s): "CKTOTAL", "CKMB", "CKMBINDEX", "TROPONINI" in the last 168 hours. BNP (last 3 results) No results for input(s): "PROBNP" in the last 8760 hours. HbA1C: No results for input(s): "HGBA1C" in the last 72 hours. CBG: No results for input(s): "GLUCAP" in the last 168 hours. Lipid Profile: No results for input(s): "CHOL", "HDL", "LDLCALC", "TRIG", "CHOLHDL", "LDLDIRECT" in the last 72 hours. Thyroid Function Tests: Recent Labs    10/01/21 1309  TSH 3.693   Anemia Panel: No results for input(s): "VITAMINB12", "FOLATE", "FERRITIN", "TIBC", "IRON", "RETICCTPCT" in the last 72 hours. Urine analysis:    Component Value Date/Time   COLORURINE YELLOW 10/01/2021 2012   APPEARANCEUR CLEAR 10/01/2021 2012   LABSPEC 1.011 10/01/2021 2012   PHURINE 6.0  10/01/2021 2012   GLUCOSEU NEGATIVE 10/01/2021 2012   GLUCOSEU NEGATIVE 08/01/2021 1238   HGBUR NEGATIVE 10/01/2021 2012   HGBUR negative 12/02/2007 0828   BILIRUBINUR NEGATIVE 10/01/2021 2012   BILIRUBINUR 1+ 09/03/2013 0950   [place] 10/01/2021 2012   PROTEINUR NEGATIVE 10/01/2021 2012   UROBILINOGEN 1.0 08/01/2021 1238   NITRITE NEGATIVE 10/01/2021 2012   LEUKOCYTESUR NEGATIVE 10/01/2021 2012   Sepsis Labs: '@LABRCNTIP'$ (procalcitonin:4,lacticidven:4) ) Recent Results (from the past 240 hour(s))  SARS Coronavirus 2 by RT PCR (hospital order, performed in [place] hospital lab) *cepheid single result test* Anterior Nasal Swab     Status: None   Collection Time: 10/01/21  7:49 PM   Specimen: Anterior Nasal Swab  Result Value Ref Range Status   SARS Coronavirus 2 by RT PCR NEGATIVE NEGATIVE Final    Comment: (NOTE) SARS-CoV-2 target nucleic acids are NOT DETECTED.  The SARS-CoV-2 RNA is generally detectable in upper and lower respiratory specimens during the acute phase of infection. The lowest concentration of SARS-CoV-2 viral copies this assay can detect is 250 copies / mL. A negative result does not preclude SARS-CoV-2 infection and should not be used as the sole basis for treatment or other patient management decisions.  A negative result may occur with improper specimen collection / handling, submission of specimen other than nasopharyngeal swab, presence of viral mutation(s) within the areas targeted by this assay, and inadequate number of viral copies (<250 copies / mL). A negative result must be combined with clinical observations, patient history, and epidemiological information.  Fact Sheet for Patients:   https://www.patel.info/  Fact Sheet for Healthcare Providers: https://hall.com/  This test is not yet approved or  cleared by the [place] FDA and has been authorized for detection and/or diagnosis of  SARS-CoV-2 by FDA under an Emergency Use Authorization (EUA).  This EUA will remain in effect (meaning this test can be used) for the duration of the COVID-19 declaration under Section 564(b)(1) of the Act, 21 U.S.C. section 360bbb-3(b)(1), unless the authorization is terminated or revoked sooner.  Performed at [facility], [place] [address] [place], [place] [address]      Radiological Exams on Admission: CT HEAD WO CONTRAST (5MM)  Result Date: 10/01/2021 CLINICAL DATA:  Provided history: Delirium.  Additional history provided: Leg swelling, incontinence, altered mental status. EXAM: CT HEAD WITHOUT CONTRAST TECHNIQUE: Contiguous axial images were obtained from the base of the skull through the vertex without intravenous contrast. RADIATION DOSE REDUCTION: This exam was performed according to the departmental dose-optimization program which includes automated exposure control, adjustment of the mA and/or kV according to patient size and/or use of iterative reconstruction technique. COMPARISON:  No pertinent prior exams available for comparison. FINDINGS: Brain: Mild-to-moderate generalized cerebral atrophy. Mild cerebellar atrophy. Mild patchy and ill-defined hypoattenuation within the cerebral white matter, nonspecific but compatible chronic small vessel ischemic disease. There is no acute intracranial hemorrhage. No demarcated cortical infarct. No extra-axial fluid collection. No evidence of an intracranial mass. No midline shift. Vascular: No hyperdense vessel.  Atherosclerotic calcifications. Skull: No fracture or aggressive osseous lesion. Sinuses/Orbits: No mass or acute finding within the imaged orbits. Moderate partial opacification of the left maxillary sinus. Mild mucosal thickening within the bilateral ethmoid sinuses. IMPRESSION: 1. No evidence of acute intracranial abnormality. 2. Mild chronic small vessel ischemic changes within the cerebral white matter. 3.  Mild-to-moderate cerebral atrophy. 4. Mild cerebellar atrophy. 5. Paranasal sinus disease at the imaged levels, as described. 6. Nonspecific 2.9 x 1.0 cm cutaneous/subcutaneous occipital scalp lesion. Direct visualization recommended. Electronically Signed   By: Kellie Simmering D.O.   On: 10/01/2021 13:04   DG Chest 2 View  Result Date: 10/01/2021 CLINICAL DATA:  Altered mental status, lower extremity swelling EXAM: CHEST - 2 VIEW COMPARISON:  09/14/2021 chest radiograph. FINDINGS: Stable single lead left subclavian pacemaker with lead tip overlying the right ventricular apex. Stable cardiomediastinal silhouette with mild cardiomegaly. No pneumothorax. No pleural effusion. No pulmonary edema. No acute consolidative airspace disease. Chronic mild eventration of the left hemidiaphragm. IMPRESSION: Stable mild cardiomegaly without pulmonary edema. No acute pulmonary disease. Electronically Signed   By: Ilona Sorrel M.D.   On: 10/01/2021 12:57    EKG: Independently reviewed. Paced rhythm.   Assessment/Plan  1. Acute encephalopathy  - Presents with progressive confusion and memory loss, worsening over the course of months but particularly last few days  - Lives alone but now forgetting how to bathe himself and perform other ADLs, leaves front door open  - In the ED, TSH and ammonia are normal and no acute findings noted on head CT  - Discussed with patient's daughters that we are unlikely to find a readily reversible cause  - Check B12 and RPR, consult PT and TOC   2. Acute on chronic diastolic CHF   - Bilateral leg swelling not improved with Lasix  - BNP elevated but lower than last month; no respiratory s/s  - EF was 55-60% with mild MR, severe LAE, and moderate TR on TTE from 2018  - Given 20 mg IV Lasix in ED  - Continue diuresis with IV Lasix, elevate legs, monitor wt and I/Os, monitor renal function and electrolytes, update echo    3. Atrial fibrillation  - Continue Eliquis   4.  Hypothyroidism  - TSH normal in ED, continue Synthroid     DVT prophylaxis: Eliquis  Code Status: Full  Level of Care: Level of care: Telemetry Family Communication: 2 daughters at bedside  Disposition Plan:  Patient is from: home  Anticipated d/c is to: TBD Anticipated d/c date is: 7/11 or 10/03/21  Patient currently: pending PT eval, safe discharge plan  Consults called: none Admission status: Observation     Vianne Bulls, MD [facility]  10/02/2021, 2:12 AM

## 2021-10-02 NOTE — Evaluation (Signed)
Physical Therapy Evaluation Patient Details Name: Daniel Everett MRN: [id number] DOB: April 05, 1931 Today's Date: 10/02/2021  History of Present Illness  Pt is an 88 y.o. male presenting with acute encephalopathy and B LE swelling. PMH significant for atrial fibrillation on Eliquis, severe bradycardia with pacemaker, hypothyroidism, hypertension, and chronic diastolic CHF, OA, peripheral neuropathy, and RTC repair.   Clinical Impression  Pt is an 88 y.o. male with above HPI resulting in the deficits listed below (see PT Problem List). Pt is typically independent at baseline without use of AD and was working part time until ~3 weeks ago. Pt and family report recent memory and balance issues. Pt performed sit to stand transfers with supervision for safety and demonstrated safe hand placement without cuing from therapist. Pt ambulated total of ~34f with MIN guard and use of RW. Pt will have intermittent assist from multiple family members upon d/c. Pt will benefit from skilled PT to maximize functional mobility to increase independence.         Recommendations for follow up therapy are one component of a multi-disciplinary discharge planning process, led by the attending physician.  Recommendations may be updated based on patient status, additional functional criteria and insurance authorization.  Follow Up Recommendations Home health PT      Assistance Recommended at Discharge Intermittent Supervision/Assistance  Patient can return home with the following  Help with stairs or ramp for entrance;Direct supervision/assist for financial management;Direct supervision/assist for medications management;Assistance with cooking/housework;A little help with bathing/dressing/bathroom;A little help with walking and/or transfers    Equipment Recommendations None recommended by PT (pt owns RW)  Recommendations for Other Services  OT consult    Functional Status Assessment Patient has had a recent decline  in their functional status and demonstrates the ability to make significant improvements in function in a reasonable and predictable amount of time.     Precautions / Restrictions Precautions Precautions: Fall Restrictions Weight Bearing Restrictions: No      Mobility  Bed Mobility Overal bed mobility: Needs Assistance Bed Mobility: Supine to Sit     Supine to sit: Supervision, HOB elevated     General bed mobility comments: cues for use of bed rail and sequencing to get to EOB. Pt sleeps in recliner at home.    Transfers Overall transfer level: Needs assistance Equipment used: Rolling walker (2 wheels) Transfers: Sit to/from Stand Sit to Stand: Supervision           General transfer comment: supervision for safety, demonstrates safe hand placement    Ambulation/Gait Ambulation/Gait assistance: Min guard Gait Distance (Feet): 70 Feet Assistive device: Rolling walker (2 wheels) Gait Pattern/deviations: Step-through pattern, Decreased stride length Gait velocity: decr     General Gait Details: no overt LOB observed. R lateral trunk lean and cervical rotation noted in supine, ambulation, and when sitting in recliner. Family reports they have noticed him favoring toward R side since fall.  Stairs            Wheelchair Mobility    Modified Rankin (Stroke Patients Only)       Balance Overall balance assessment: Needs assistance Sitting-balance support: Feet supported Sitting balance-Leahy Scale: Fair     Standing balance support: Bilateral upper extremity supported, During functional activity Standing balance-Leahy Scale: Fair                               Pertinent Vitals/Pain Pain Assessment Pain Assessment: No/denies pain  Home Living Family/patient expects to be discharged to:: Private residence Living Arrangements: Alone Available Help at Discharge: Family;Available PRN/intermittently Type of Home: House Home Access: Stairs  to enter Entrance Stairs-Rails: None Entrance Stairs-Number of Steps: 2   Home Layout: One level Home Equipment: Conservation officer, nature (2 wheels);Shower seat;BSC/3in1;Wheelchair - manual;Hospital bed Additional Comments: family check in everyday. Can be available PRN (after work hours). Sleeps in recliner.    Prior Function Prior Level of Function : Independent/Modified Independent             Mobility Comments: fall about 3 weeks ago got up himself, family assisted pt to ER. using RW for past wk. No AD prior ADLs Comments: Driving until about 3 weeks ago. Doing yard work, worked part time driving for [employer]. Family provides meals. Daughter reports pt has recenlty beenhavign issues with bowel incontinence     Hand Dominance   Dominant Hand: Right    Extremity/Trunk Assessment   Upper Extremity Assessment Upper Extremity Assessment: RUE deficits/detail;LUE deficits/detail RUE Deficits / Details: fair grip strength, lean/shoulder depression favoring toward R side in supine, sitting, and during ambulation. Able to perform cervical ROM all directions (limited flexion and some limitations noted with cervical rotation bilaterally)    Lower Extremity Assessment Lower Extremity Assessment: Generalized weakness    Cervical / Trunk Assessment Cervical / Trunk Assessment: Normal  Communication   Communication: No difficulties  Cognition Arousal/Alertness: Awake/alert Behavior During Therapy: WFL for tasks assessed/performed Overall Cognitive Status: Impaired/Different from baseline Area of Impairment: Memory                               General Comments: Pt A&O x4 during session with increased time for responses. Able to tell therapist that family brought him into hospital because he was having "some memory issues". Does have some difficulty remembering equipment he has at home, daughters present to assist/confirm home equipment.        General Comments       Exercises     Assessment/Plan    PT Assessment Patient needs continued PT services  PT Problem List Decreased strength;Decreased activity tolerance;Decreased balance;Decreased mobility       PT Treatment Interventions DME instruction;Gait training;Stair training;Functional mobility training;Therapeutic activities;Therapeutic exercise;Balance training;Patient/family education    PT Goals (Current goals can be found in the Care Plan section)  Acute Rehab PT Goals Patient Stated Goal: Improve balance PT Goal Formulation: With patient/family Time For Goal Achievement: 10/16/21 Potential to Achieve Goals: Good    Frequency Min 3X/week     Co-evaluation               AM-PAC PT "6 Clicks" Mobility  Outcome Measure Help needed turning from your back to your side while in a flat bed without using bedrails?: A Little Help needed moving from lying on your back to sitting on the side of a flat bed without using bedrails?: A Little Help needed moving to and from a bed to a chair (including a wheelchair)?: A Little Help needed standing up from a chair using your arms (e.g., wheelchair or bedside chair)?: A Little Help needed to walk in hospital room?: A Little Help needed climbing 3-5 steps with a railing? : A Lot 6 Click Score: 17    End of Session Equipment Utilized During Treatment: Gait belt Activity Tolerance: Patient tolerated treatment well Patient left: in chair;with call bell/phone within reach;with family/visitor present Nurse Communication: Mobility status PT  Visit Diagnosis: Unsteadiness on feet (R26.81);Muscle weakness (generalized) (M62.81)    Time: 5146-0479 PT Time Calculation (min) (ACUTE ONLY): 32 min   Charges:   PT Evaluation $PT Eval Low Complexity: 1 Low PT Treatments $Therapeutic Activity: 8-22 mins        Festus Barren PT, DPT  Acute Rehabilitation Services  Office [phone]  10/02/2021, 11:15 AM

## 2021-10-02 NOTE — Progress Notes (Addendum)
PROGRESS NOTE    Daniel Everett  [id number]:[id number]  DOB: [birth year]  DOA: 10/01/2021 PCP: [name], MD Outpatient Specialists:   Hospital course:  Daniel Everett is a pleasant 88 y.o. male with medical history significant for atrial fibrillation on Eliquis, severe bradycardia with pacemaker, hypothyroidism, hypertension, and chronic diastolic CHF was admitted 10/01/2021 for evaluation of confusion and leg swelling.    Subjective:  Patient does know he is at [facility].  Does not really remember how he got here or why he is here. Per patient's daughter-in-law who is an Therapist, sports, patient has had subacute memory dysfunction over the past 3 to 4 months however has had a significant change over the past 3 weeks is gotten much worse.  Apparently patient has also been "listing to the right" during that time.   Objective: Vitals:   10/02/21 0437 10/02/21 0850 10/02/21 1121 10/02/21 1304  BP: (!) 145/88 111/72 98/65 112/71  Pulse: 85 64  65  Resp: 20 16  14   Temp: 97.8 F (36.6 C) 98 F (36.7 C)  97.6 F (36.4 C)  TempSrc:  Oral  Oral  SpO2: 100% 100%  100%  Weight:      Height:        Intake/Output Summary (Last 24 hours) at 10/02/2021 1535 Last data filed at 10/02/2021 1320 Gross per 24 hour  Intake 603 ml  Output 1850 ml  Net -1247 ml   Filed Weights   10/02/21 0116  Weight: 84.7 kg     Exam:  General: Patient sitting up in recliner with attentive daughter-in-law at bedside.  Patient answers questions appropriately although clearly seems to have short-term memory loss. Eyes: sclera anicteric, conjuctiva mild injection bilaterally CVS: S1-S2, regular  Respiratory:  decreased air entry bilaterally secondary to decreased inspiratory effort, rales at bases  GI: NABS, soft, NT  LE: Patient has several erythematous patches on his lower extremities below his knees without surrounding erythema.  Has 1+ edema to above ankles.  Assessment & Plan:   Acute  worsening of what sounds like baseline mild dementia. 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 Sounds like about 3 weeks ago something changed where he became much more forgetful and confused.  He possibly started having a gait abnormality and listing to the right at that time as well. There is some consideration of NPH as well.  However the acute worsening in the last 3 weeks is not consistent necessarily with NPH. Head CT shows no acute abnormality, mild chronic small vessel disease, mild to moderate cerebral atrophy and mild cerebellar atrophy. Will order MRI for possible stroke 3 weeks ago, may need to be done at [facility] given pacemaker. Ammonia level is normal TSH is normal at 3.7 RPR is negative. B12 is normal at 76  Fall with new fecal incontinence Patient apparently fell a few weeks ago, before his acute deterioration but it was unwittenssed. Since then, patient has had gait abnormality and fecal incontinence. Unclear if incontinence is from confusion or loss of sphincter tone Will order lumbar MRI  Rash on lower extremities bilaterally, NEW x 3-4 weeks Patient has been treated for eczema as an outpatient I have some concern this may be a vasculitis Will order ESR, CRP and ANA May need to have a biopsy--will need to consult general  surgery   LE edema Apparently not improved with Lasix New echocardiogram pending, echo from 2018 was normal EF Will discontinue daily Lasix until echo results are back.  Atrial fibrillation Rate controlled off rate controlling medication Continue Eliquis  Hypothyroidism Continue Synthroid  GERD Continue pantoprazole  Glaucoma Continue atenolol  Disposition POC/PT/OT eval requested   DVT prophylaxis: Eliquis Code Status: Full Family Communication: Spoke with patient's  daughter-in-law who is an Financial controller Disposition Plan:   Patient is from: Home  Anticipated Discharge Location: TBD  Barriers to Discharge: Ongoing work-up for subacute change in memory  Is patient medically stable for Discharge:  No   Scheduled Meds:  apixaban  5 mg Oral BID   atorvastatin  20 mg Oral QPM   finasteride  5 mg Oral Daily   furosemide  20 mg Intravenous Q12H   levothyroxine  75 mcg Oral Q0600   lisinopril  20 mg Oral Daily   pantoprazole  40 mg Oral Daily   sodium chloride flush  3 mL Intravenous Q12H   timolol  1 drop Left Eye BID   topiramate  25 mg Oral BID   triamcinolone cream  1 Application Topical BID   Continuous Infusions:  Data Reviewed:  Basic Metabolic Panel: Recent Labs  Lab 10/01/21 1309 10/02/21 0411  NA 139 141  K 3.7 3.5  CL 106 108  CO2 26 26  GLUCOSE 106* 98  BUN 21 21  CREATININE 1.05 0.91  CALCIUM 8.9 8.5*    CBC: Recent Labs  Lab 10/01/21 1309 10/02/21 0411  WBC 6.1 6.1  HGB 15.6 13.5  HCT 48.1 41.9  MCV 102.1* 103.2*  PLT 225 181    Studies: CT HEAD WO CONTRAST (5MM)  Result Date: 10/01/2021 CLINICAL DATA:  Provided history: Delirium. Additional history provided: Leg swelling, incontinence, altered mental status. EXAM: CT HEAD WITHOUT CONTRAST TECHNIQUE: Contiguous axial images were obtained from the base of the skull through the vertex without intravenous contrast. RADIATION DOSE REDUCTION: This exam was performed according to the departmental dose-optimization program which includes automated exposure control, adjustment of the mA and/or kV according to patient size and/or use of iterative reconstruction technique. COMPARISON:  No pertinent prior exams available for comparison. FINDINGS: Brain: Mild-to-moderate generalized cerebral atrophy. Mild cerebellar atrophy. Mild patchy and ill-defined hypoattenuation within the cerebral white matter, nonspecific but compatible chronic small vessel ischemic disease. There is no  acute intracranial hemorrhage. No demarcated cortical infarct. No extra-axial fluid collection. No evidence of an intracranial mass. No midline shift. Vascular: No hyperdense vessel.  Atherosclerotic calcifications. Skull: No fracture or aggressive osseous lesion. Sinuses/Orbits: No mass or acute finding within the imaged orbits. Moderate partial opacification of the left maxillary sinus. Mild mucosal thickening within the bilateral ethmoid sinuses. IMPRESSION: 1. No evidence of acute intracranial abnormality. 2. Mild chronic small vessel ischemic changes within the cerebral white matter. 3. Mild-to-moderate cerebral atrophy. 4. Mild cerebellar atrophy. 5. Paranasal sinus disease at the imaged levels, as described. 6. Nonspecific 2.9 x 1.0 cm cutaneous/subcutaneous occipital scalp lesion. Direct visualization recommended. Electronically Signed   By: Kellie Simmering D.O.   On: 10/01/2021 13:04   DG Chest 2 View  Result Date: 10/01/2021 CLINICAL DATA:  Altered mental status, lower extremity swelling EXAM: CHEST - 2 VIEW COMPARISON:  09/14/2021 chest radiograph. FINDINGS: Stable single lead left subclavian pacemaker with lead tip overlying the right ventricular apex. Stable cardiomediastinal silhouette with mild cardiomegaly. No pneumothorax. No pleural effusion. No pulmonary edema. No acute consolidative airspace disease.  Chronic mild eventration of the left hemidiaphragm. IMPRESSION: Stable mild cardiomegaly without pulmonary edema. No acute pulmonary disease. Electronically Signed   By: Ilona Sorrel M.D.   On: 10/01/2021 12:57    Principal Problem:   Acute encephalopathy Active Problems:   Hypothyroidism   Permanent atrial fibrillation (HCC)   Leg swelling   Acute on chronic diastolic CHF (congestive heart failure) ([place])     Josceline Chenard Derek Jack, [facility]  If 7PM-7AM, please contact night-coverage [email or web address]   LOS: 0 days

## 2021-10-02 NOTE — Plan of Care (Signed)
  Problem: Education: Goal: Knowledge of General Education information will improve Description Including pain rating scale, medication(s)/side effects and non-pharmacologic comfort measures Outcome: Progressing   Problem: Health Behavior/Discharge Planning: Goal: Ability to manage health-related needs will improve Outcome: Progressing   

## 2021-10-03 ENCOUNTER — Observation Stay (HOSPITAL_COMMUNITY): Payer: Medicare HMO

## 2021-10-03 ENCOUNTER — Observation Stay (HOSPITAL_BASED_OUTPATIENT_CLINIC_OR_DEPARTMENT_OTHER): Payer: Medicare HMO

## 2021-10-03 ENCOUNTER — Ambulatory Visit: Payer: Medicare HMO | Admitting: Family Medicine

## 2021-10-03 DIAGNOSIS — J3489 Other specified disorders of nose and nasal sinuses: Secondary | ICD-10-CM | POA: Diagnosis not present

## 2021-10-03 DIAGNOSIS — M4316 Spondylolisthesis, lumbar region: Secondary | ICD-10-CM | POA: Diagnosis not present

## 2021-10-03 DIAGNOSIS — G9340 Encephalopathy, unspecified: Secondary | ICD-10-CM | POA: Diagnosis not present

## 2021-10-03 DIAGNOSIS — Z8673 Personal history of transient ischemic attack (TIA), and cerebral infarction without residual deficits: Secondary | ICD-10-CM | POA: Diagnosis not present

## 2021-10-03 DIAGNOSIS — I5033 Acute on chronic diastolic (congestive) heart failure: Secondary | ICD-10-CM | POA: Diagnosis not present

## 2021-10-03 LAB — BASIC METABOLIC PANEL
Anion gap: 8 (ref 5–15)
BUN: 22 mg/dL (ref 8–23)
CO2: 24 mmol/L (ref 22–32)
Calcium: 8.6 mg/dL — ABNORMAL LOW (ref 8.9–10.3)
Chloride: 109 mmol/L (ref 98–111)
Creatinine, Ser: 1.02 mg/dL (ref 0.61–1.24)
GFR, Estimated: >60 mL/min (ref >60)
Glucose, Bld: 113 mg/dL — ABNORMAL HIGH (ref 70–99)
Potassium: 3.4 mmol/L — ABNORMAL LOW (ref 3.5–5.1)
Sodium: 141 mmol/L (ref 135–145)

## 2021-10-03 LAB — ECHOCARDIOGRAM COMPLETE
AR max vel: 2.74 cm2
AV Area VTI: 2.87 cm2
AV Area mean vel: 2.64 cm2
AV Mean grad: 3 mmHg
AV Peak grad: 5.5 mmHg
Ao pk vel: 1.18 m/s
Area-P 1/2: 3.39 cm2
Calc EF: 60.3 %
Height: 76 in
MV M vel: 4.42 m/s
MV Peak grad: 78.1 mmHg
P 1/2 time: 699 msec
S' Lateral: 3.9 cm
Single Plane A2C EF: 66.5 %
Single Plane A4C EF: 53.9 %
Weight: 2973.56 oz

## 2021-10-03 LAB — SEDIMENTATION RATE: Sed Rate: 4 mm/hr (ref 0–16)

## 2021-10-03 LAB — C-REACTIVE PROTEIN: CRP: 0.6 mg/dL (ref <1.0)

## 2021-10-03 MED ORDER — GADOBUTROL 1 MMOL/ML IV SOLN
8.0000 mL | Freq: Once | INTRAVENOUS | Status: AC | PRN
  Administered 2021-10-03: 8 mL via INTRAVENOUS

## 2021-10-03 MED ORDER — GADOBUTROL 1 MMOL/ML IV SOLN: 8.0000 mL | Freq: Once | INTRAVENOUS | Status: DC | PRN

## 2021-10-03 NOTE — Progress Notes (Addendum)
PROGRESS NOTE    Daniel Everett  BVQ:[id number]  DOB: 06/15/1931  DOA: 10/01/2021 PCP: [name], MD Outpatient Specialists:   Hospital course:  Daniel Everett is a pleasant 88 y.o. male with medical history significant for atrial fibrillation on Eliquis, severe bradycardia with pacemaker, hypothyroidism, hypertension, and chronic diastolic CHF was admitted 10/01/2021 for evaluation of confusion and leg swelling.    Subjective:  "I am still here".  Patient without acute complaints.  Attentive daughter-in-law is at bedside.  Objective: Vitals:   10/03/21 0455 10/03/21 0500 10/03/21 1011 10/03/21 1408  BP: (!) 140/91  121/77 97/64  Pulse: 63   63  Resp: 19   20  Temp: 97.8 F (36.6 C)   97.8 F (36.6 C)  TempSrc: Oral   Oral  SpO2: 98%   96%  Weight:  84.3 kg    Height:        Intake/Output Summary (Last 24 hours) at 10/03/2021 1600 Last data filed at 10/03/2021 1011 Gross per 24 hour  Intake 243 ml  Output 800 ml  Net -557 ml    Filed Weights   10/02/21 0116 10/03/21 0500  Weight: 84.7 kg 84.3 kg     Exam:  General: Patient sitting up in bed with attentive daughter-in-law at bedside.  Patient answers questions appropriately although clearly seems to have short-term memory loss. Eyes: sclera anicteric, conjuctiva mild injection bilaterally CVS: S1-S2, regular  Respiratory:  decreased air entry bilaterally secondary to decreased inspiratory effort, rales at bases  GI: NABS, soft, NT  LE: Patient has several erythematous patches on his lower extremities below his knees without surrounding erythema.  Has 1+ edema to above ankles.  Assessment & Plan:   Acute worsening of what sounds like baseline mild dementia. MRI for possible stroke 3 weeks ago is ordered and pending May need to be done at [facility] given pacemaker. 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 Sounds like about 3 weeks ago something changed where he became much more forgetful and confused.  He possibly started having a gait abnormality and listing to the right at that time as well. There is some consideration of NPH as well.  However the acute worsening in the last 3 weeks is not consistent necessarily with NPH. Head CT shows no acute abnormality, mild chronic small vessel disease, mild to moderate cerebral atrophy and mild cerebellar atrophy. Ammonia level is normal TSH is normal at 3.7 RPR is negative. B12 is normal at 32  Fall with new fecal incontinence Lumbar MRI is ordered and pending Patient apparently fell a few weeks ago, before his acute deterioration but it was unwittenssed. Since then, patient has had gait abnormality and fecal incontinence. Unclear if incontinence is from confusion or loss of sphincter tone Will order lumbar MRI  Rash on lower extremities bilaterally, NEW x 3-4 weeks Initial concern for vasculitis however sed rate CRP and ANA are negative which makes vasculitis much less likely. Patient will try to remember the name of his dermatologist, can potentially send picture of rash to dermatology to get recommendations.  LE edema Apparently not improved with Lasix but is improved with elevation. Suggestive of venous stasis disease New echocardiogram pending, echo from 2018 was normal EF Will discontinue daily Lasix until echo results are back.  Atrial fibrillation Rate controlled  off rate controlling medication Continue Eliquis  Hypothyroidism Continue Synthroid  GERD Continue pantoprazole  Glaucoma Continue atenolol  Disposition POC/PT/OT eval requested   DVT prophylaxis: Eliquis Code Status: Full Family Communication: Spoke with patient's daughter-in-law who is an Financial controller Disposition Plan:   Patient is from: Home  Anticipated Discharge Location: TBD  Barriers  to Discharge: Ongoing work-up for subacute change in memory  Is patient medically stable for Discharge:  No   Scheduled Meds:  apixaban  5 mg Oral BID   atorvastatin  20 mg Oral QPM   finasteride  5 mg Oral Daily   levothyroxine  75 mcg Oral Q0600   lisinopril  20 mg Oral Daily   pantoprazole  40 mg Oral Daily   sodium chloride flush  3 mL Intravenous Q12H   timolol  1 drop Left Eye BID   topiramate  25 mg Oral BID   triamcinolone cream  1 Application Topical BID   Continuous Infusions:  Data Reviewed:  Basic Metabolic Panel: Recent Labs  Lab 10/01/21 1309 10/02/21 0411 10/03/21 0448  NA 139 141 141  K 3.7 3.5 3.4*  CL 106 108 109  CO2 '26 26 24  '$ GLUCOSE 106* 98 113*  BUN '21 21 22  '$ CREATININE 1.05 0.91 1.02  CALCIUM 8.9 8.5* 8.6*     CBC: Recent Labs  Lab 10/01/21 1309 10/02/21 0411  WBC 6.1 6.1  HGB 15.6 13.5  HCT 48.1 41.9  MCV 102.1* 103.2*  PLT 225 181     Studies: ECHOCARDIOGRAM COMPLETE  Result Date: 10/03/2021    ECHOCARDIOGRAM REPORT   Patient Name:   Daniel Everett Date of Exam: 10/03/2021 Medical Rec #:  [id number]       Height:       76.0 in Accession #:    [id number]      Weight:       185.8 lb Date of Birth:  Aug 03, 1931       BSA:          2.147 m Patient Age:    81 years        BP:           97/64 mmHg Patient Gender: M               HR:           63 bpm. Exam Location:  Inpatient Procedure: 2D Echo, Color Doppler and Cardiac Doppler Indications:    Congestive heart failure  History:        Patient has prior history of Echocardiogram examinations, most                 recent 04/08/2016. CHF, Pacemaker, Arrythmias:Bradycardia; Risk                 Factors:Hypertension. Hypothyroidism.  Sonographer:    Joette Catching RCS Referring Phys: [phone] TIMOTHY S OPYD  Sonographer Comments: Image acquisition challenging due to patient body habitus and Supine study. IMPRESSIONS  1. Left ventricular ejection fraction, by estimation, is 60 to 65%. The left ventricle  has normal function. The left ventricle has no regional wall motion abnormalities. Left ventricular diastolic function could not be evaluated.  2. Right ventricular systolic function is normal. The right ventricular size is moderately enlarged. There is mildly elevated pulmonary artery systolic pressure. The estimated right ventricular systolic pressure is 95.0 mmHg.  3. Left atrial size was severely dilated.  4. Right atrial size was severely dilated.  5. The mitral valve  is normal in structure. Trivial mitral valve regurgitation. No evidence of mitral stenosis.  6. The aortic valve is tricuspid. Aortic valve regurgitation is mild. Aortic valve sclerosis/calcification is present, without any evidence of aortic stenosis. Aortic regurgitation PHT measures 699 msec. Aortic valve area, by VTI measures 2.87 cm. Aortic valve mean gradient measures 3.0 mmHg. Aortic valve Vmax measures 1.18 m/s.  7. Aortic dilatation noted. There is mild dilatation of the aortic root, measuring 42 mm.  8. The inferior vena cava is dilated in size with <50% respiratory variability, suggesting right atrial pressure of 15 mmHg. FINDINGS  Left Ventricle: Left ventricular ejection fraction, by estimation, is 60 to 65%. The left ventricle has normal function. The left ventricle has no regional wall motion abnormalities. The left ventricular internal cavity size was normal in size. There is  no left ventricular hypertrophy. Abnormal (paradoxical) septal motion, consistent with left bundle branch block. Left ventricular diastolic function could not be evaluated due to paced rhythm. Left ventricular diastolic function could not be evaluated. Right Ventricle: The right ventricular size is moderately enlarged. No increase in right ventricular wall thickness. Right ventricular systolic function is normal. There is mildly elevated pulmonary artery systolic pressure. The tricuspid regurgitant velocity is 2.41 m/s, and with an assumed right atrial  pressure of 15 mmHg, the estimated right ventricular systolic pressure is 37.1 mmHg. Left Atrium: Left atrial size was severely dilated. Right Atrium: Right atrial size was severely dilated. Pericardium: There is no evidence of pericardial effusion. Mitral Valve: The mitral valve is normal in structure. Trivial mitral valve regurgitation. No evidence of mitral valve stenosis. Tricuspid Valve: The tricuspid valve is normal in structure. Tricuspid valve regurgitation is mild . No evidence of tricuspid stenosis. Aortic Valve: The aortic valve is tricuspid. Aortic valve regurgitation is mild. Aortic regurgitation PHT measures 699 msec. Aortic valve sclerosis/calcification is present, without any evidence of aortic stenosis. Aortic valve mean gradient measures 3.0  mmHg. Aortic valve peak gradient measures 5.5 mmHg. Aortic valve area, by VTI measures 2.87 cm. Pulmonic Valve: The pulmonic valve was normal in structure. Pulmonic valve regurgitation is trivial. No evidence of pulmonic stenosis. Aorta: Aortic dilatation noted. There is mild dilatation of the aortic root, measuring 42 mm. Venous: The inferior vena cava is dilated in size with less than 50% respiratory variability, suggesting right atrial pressure of 15 mmHg. IAS/Shunts: No atrial level shunt detected by color flow Doppler.  LEFT VENTRICLE PLAX 2D LVIDd:         5.00 cm     Diastology LVIDs:         3.90 cm     LV e' medial:    6.42 cm/s LV PW:         1.00 cm     LV E/e' medial:  10.9 LV IVS:        1.00 cm     LV e' lateral:   7.29 cm/s LVOT diam:     2.20 cm     LV E/e' lateral: 9.6 LV SV:         59 LV SV Index:   27 LVOT Area:     3.80 cm  LV Volumes (MOD) LV vol d, MOD A2C: 67.5 ml LV vol d, MOD A4C: 68.8 ml LV vol s, MOD A2C: 22.6 ml LV vol s, MOD A4C: 31.7 ml LV SV MOD A2C:     44.9 ml LV SV MOD A4C:     68.8 ml LV SV MOD BP:  41.4 ml RIGHT VENTRICLE             IVC RV Basal diam:  4.60 cm     IVC diam: 2.60 cm RV Mid diam:    2.40 cm RV S prime:      17.10 cm/s TAPSE (M-mode): 1.9 cm LEFT ATRIUM              Index        RIGHT ATRIUM           Index LA diam:        4.80 cm  2.24 cm/m   RA Area:     36.40 cm LA Vol (A2C):   133.0 ml 61.94 ml/m  RA Volume:   140.00 ml 65.20 ml/m LA Vol (A4C):   90.9 ml  42.33 ml/m LA Biplane Vol: 110.0 ml 51.23 ml/m  AORTIC VALVE AV Area (Vmax):    2.74 cm AV Area (Vmean):   2.64 cm AV Area (VTI):     2.87 cm AV Vmax:           117.50 cm/s AV Vmean:          77.700 cm/s AV VTI:            0.205 m AV Peak Grad:      5.5 mmHg AV Mean Grad:      3.0 mmHg LVOT Vmax:         84.80 cm/s LVOT Vmean:        54.000 cm/s LVOT VTI:          0.155 m LVOT/AV VTI ratio: 0.76 AI PHT:            699 msec  AORTA Ao Root diam: 4.20 cm MITRAL VALVE               TRICUSPID VALVE MV Area (PHT): 3.39 cm    TR Peak grad:   23.2 mmHg MV Decel Time: 224 msec    TR Vmax:        241.00 cm/s MR Peak grad: 78.1 mmHg MR Vmax:      442.00 cm/s  SHUNTS MV E velocity: 69.80 cm/s  Systemic VTI:  0.16 m MV A velocity: 26.10 cm/s  Systemic Diam: 2.20 cm MV E/A ratio:  2.67 Tressia Miners Turner MD Electronically signed by Fransico Him MD Signature Date/Time: 10/03/2021/3:19:32 PM    Final     Principal Problem:   Acute encephalopathy Active Problems:   Hypothyroidism   Permanent atrial fibrillation (HCC)   Leg swelling   Acute on chronic diastolic CHF (congestive heart failure) ([place])     Donelda Mailhot Tublu Kamerin Grumbine, [facility]  If 7PM-7AM, please contact night-coverage [email or web address]   LOS: 0 days

## 2021-10-03 NOTE — TOC Initial Note (Signed)
Transition of Care [facility]) - Initial/Assessment Note    Patient Details  Name: Daniel Everett MRN: [id number] Date of Birth: Nov 01, 1931  Transition of Care [facility]) CM/SW Contact:    Leeroy Cha, RN Phone Number: 10/03/2021, 11:07 AM  Clinical Narrative:                 Will follow for possible snf placement.  Will need pt eval.  Expected Discharge Plan: [place] Barriers to Discharge: Continued Medical Work up   Patient Goals and CMS Choice Patient states their goals for this hospitalization and ongoing recovery are:: not stated      Expected Discharge Plan and Services Expected Discharge Plan: [place]   Discharge Planning Services: CM Consult   Living arrangements for the past 2 months: Single Family Home                                      Prior Living Arrangements/Services Living arrangements for the past 2 months: Single Family Home Lives with:: Spouse Patient language and need for interpreter reviewed:: Yes Do you feel safe going back to the place where you live?: Yes            Criminal Activity/Legal Involvement Pertinent to Current Situation/Hospitalization: No - Comment as needed  Activities of Daily Living Home Assistive Devices/Equipment: Eyeglasses, Dentures (specify type), Walker (specify type) ADL Screening (condition at time of admission) Patient's cognitive ability adequate to safely complete daily activities?: No Is the patient deaf or have difficulty hearing?: Yes Does the patient have difficulty seeing, even when wearing glasses/contacts?: No Does the patient have difficulty concentrating, remembering, or making decisions?: Yes Patient able to express need for assistance with ADLs?: Yes Does the patient have difficulty dressing or bathing?: No Independently performs ADLs?: No Communication: Needs assistance Is this a change from baseline?: Pre-admission baseline Dressing (OT): Needs assistance Is  this a change from baseline?: Pre-admission baseline Grooming: Needs assistance Is this a change from baseline?: Pre-admission baseline Feeding: Needs assistance Is this a change from baseline?: Pre-admission baseline Bathing: Needs assistance Is this a change from baseline?: Pre-admission baseline Toileting: Needs assistance Is this a change from baseline?: Pre-admission baseline In/Out Bed: Needs assistance Is this a change from baseline?: Pre-admission baseline Walks in Home: Needs assistance Is this a change from baseline?: Pre-admission baseline Does the patient have difficulty walking or climbing stairs?: Yes Weakness of Legs: Both Weakness of Arms/Hands: Both  Permission Sought/Granted                  Emotional Assessment Appearance:: Appears stated age     Orientation: : Oriented to Self, Oriented to Place, Oriented to  Time, Oriented to Situation Alcohol / Substance Use: Not Applicable Psych Involvement: No (comment)  Admission diagnosis:  Peripheral edema [R60.9] Acute encephalopathy [G93.40] Acute on chronic congestive heart failure, unspecified heart failure type [facility]) [I50.9] Patient Active Problem List   Diagnosis Date Noted   Leg swelling 10/02/2021   Acute on chronic diastolic CHF (congestive heart failure) ([place])    Acute encephalopathy 10/01/2021   Hydrocele 05/03/2020   Open-angle glaucoma 05/03/2020   Psychosexual dysfunction with inhibited sexual excitement 05/03/2020   Cortical senile cataract 05/03/2020   Senile nuclear sclerosis 05/03/2020   Melanoma of left side of neck ([place]) 01/29/2019   Anticoagulated by anticoagulation treatment 01/08/2019   Permanent atrial fibrillation ([place]) 08/20/2018   Peripheral  neuropathy 04/08/2018   Gait abnormality 03/02/2018   Primary osteoarthritis of right knee 06/25/2017   Essential tremor 06/25/2016   Atrial fibrillation ([place]) 03/26/2016   GERD (gastroesophageal reflux disease) 02/08/2015   Nocturia  02/08/2015   Preventative health care 05/04/2010   GEN OSTEOARTHROSIS INVOLVING MULTIPLE SITES 01/03/2010   ONYCHOMYCOSIS 06/06/2009   HOARSENESS, CHRONIC 10/18/2008   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   ALLERGIC RHINITIS DUE TO POLLEN 06/07/2008   NEOPLASM, MALIGNANT, PROSTATE, HX OF, S/P TURP 05/28/2007   Hypothyroidism 11/27/2006   DIVERTICULOSIS, COLON 11/27/2006   Osteoarthritis 11/27/2006   Hyperlipidemia 10/28/2006   Essential hypertension 10/28/2006   History of colonic polyps 10/28/2006   Other and unspecified hyperlipidemia 03/25/1998   PCP:  [name], MD Pharmacy:   [facility] [place] [facility] [place] [facility] [place] [address] [place] [address] Phone: [phone] Fax: [phone]  [facility] [address] [place] [facility] [address] [place] [address] Phone: [phone] Fax: [phone]     Social Determinants of Health (SDOH) Interventions    Readmission Risk Interventions     No data to display

## 2021-10-03 NOTE — Plan of Care (Signed)
  Problem: Education: Goal: Knowledge of General Education information will improve Description Including pain rating scale, medication(s)/side effects and non-pharmacologic comfort measures Outcome: Progressing   Problem: Health Behavior/Discharge Planning: Goal: Ability to manage health-related needs will improve Outcome: Progressing   

## 2021-10-03 NOTE — Evaluation (Signed)
Occupational Therapy Evaluation Patient Details Name: Daniel Everett MRN: [id number] DOB: 22-Nov-1931 Today's Date: 10/03/2021   History of Present Illness Pt is an 88 y.o. male presenting with acute encephalopathy and B LE swelling. PMH significant for atrial fibrillation on Eliquis, severe bradycardia with pacemaker, hypothyroidism, hypertension, and chronic diastolic CHF, OA, peripheral neuropathy, and RTC repair.   Clinical Impression      Patient is a 88 year old male who was admitted for above. Patient lives at home alone at baseline. Per TOC, patients family is unable to offer 24/7 caregiver support in next level of care. Patient was noted to have decreased functional activity tolerance, decreased endurance, decreased standing balance, decreased safety awareness, and decreased knowledge of AD/AE impacting participation in ADLs.  Patient would benefit from short term rehab to increase independence and safety in ADLs.    Recommendations for follow up therapy are one component of a multi-disciplinary discharge planning process, led by the attending physician.  Recommendations may be updated based on patient status, additional functional criteria and insurance authorization.   Follow Up Recommendations  Skilled nursing-short term rehab (<3 hours/day)    Assistance Recommended at Discharge Frequent or constant Supervision/Assistance  Patient can return home with the following A lot of help with walking and/or transfers;A lot of help with bathing/dressing/bathroom;Assistance with cooking/housework;Direct supervision/assist for financial management;Assist for transportation;Help with stairs or ramp for entrance;Direct supervision/assist for medications management    Functional Status Assessment  Patient has had a recent decline in their functional status and demonstrates the ability to make significant improvements in function in a reasonable and predictable amount of time.  Equipment  Recommendations       Recommendations for Other Services       Precautions / Restrictions Precautions Precautions: Fall Restrictions Weight Bearing Restrictions: No      Mobility Bed Mobility Overal bed mobility: Needs Assistance Bed Mobility: Supine to Sit     Supine to sit: Supervision     General bed mobility comments: with increased time to sit on edge of bed.    Transfers                          Balance Overall balance assessment: Needs assistance Sitting-balance support: Feet supported Sitting balance-Leahy Scale: Fair     Standing balance support: Bilateral upper extremity supported, During functional activity Standing balance-Leahy Scale: Fair                             ADL either performed or assessed with clinical judgement   ADL Overall ADL's : Needs assistance/impaired Eating/Feeding: Set up;Sitting   Grooming: Sitting;Min guard   Upper Body Bathing: Minimal assistance;Sitting   Lower Body Bathing: Maximal assistance;Sit to/from stand;Sitting/lateral leans Lower Body Bathing Details (indicate cue type and reason): unable to bring feet onto lap Upper Body Dressing : Set up;Sitting   Lower Body Dressing: Maximal assistance;Sit to/from stand;Sitting/lateral leans Lower Body Dressing Details (indicate cue type and reason): patient is unable to reach BLE in bed or sitting EOB Toilet Transfer: Moderate assistance;Rolling walker (2 wheels) Toilet Transfer Details (indicate cue type and reason): patient needed increased assist for sit to stand with cues for transfers with patient being first time out of bed this date. Toileting- Clothing Manipulation and Hygiene: Minimal assistance;Sit to/from stand;Sitting/lateral lean Toileting - Clothing Manipulation Details (indicate cue type and reason): standing to complete hygiene tasks. patient completed his portion seated.  Vision Baseline Vision/History: 1 Wears  glasses Ability to See in Adequate Light: 2 Moderately impaired Patient Visual Report: No change from baseline       Perception     Praxis      Pertinent Vitals/Pain Pain Assessment Pain Assessment: No/denies pain     Hand Dominance Right   Extremity/Trunk Assessment Upper Extremity Assessment RUE Deficits / Details: patient ROM WFL. noted to favor leaning to R side in all movments. patient unaware of leaning.   Lower Extremity Assessment Lower Extremity Assessment: Defer to PT evaluation   Cervical / Trunk Assessment Cervical / Trunk Assessment: Normal   Communication Communication Communication: No difficulties   Cognition Arousal/Alertness: Awake/alert Behavior During Therapy: WFL for tasks assessed/performed Overall Cognitive Status: Impaired/Different from baseline                                 General Comments: patient was off on day of week by one day. patient was unable to remember words from mini cog test. patient was able to draw clock with both of the handles long. patient scoted 2 on mini cog.     General Comments  patient was noted to have weeping of BLE with nursing aware at this time. patient noted to have tremor like movements in RUE with attempting to fill out clock and with all movements. patient and daughter in law in room were educated to trying weights to see if they calm the tremors. patient was confused with poitn of them. patients daughter in law verbalzied understanding.    Exercises     Shoulder Instructions      Home Living Family/patient expects to be discharged to:: Private residence Living Arrangements: Alone Available Help at Discharge: Family;Available PRN/intermittently Type of Home: House Home Access: Stairs to enter [employer] of Steps: 2 Entrance Stairs-Rails: None Home Layout: One level     Bathroom Shower/Tub: Astronomer Accessibility: Yes   Home Equipment: Conservation officer, nature (2  wheels);Shower seat;BSC/3in1;Wheelchair - manual;Hospital bed   Additional Comments: family check in everyday. Can be available PRN (after work hours). Sleeps in recliner.      Prior Functioning/Environment Prior Level of Function : Independent/Modified Independent             Mobility Comments: fall about 3 weeks ago got up himself, family assisted pt to ER. using RW for past wk. No AD prior ADLs Comments: Driving until about 3 weeks ago. Doing yard work, worked part time driving for [employer]. Family provides meals. Daughter reports pt has recenlty beenhavign issues with bowel incontinence        OT Problem List: Decreased activity tolerance;Impaired balance (sitting and/or standing);Decreased safety awareness;Decreased knowledge of precautions;Decreased knowledge of use of DME or AE      OT Treatment/Interventions: Self-care/ADL training;Therapeutic exercise;Neuromuscular education;Energy conservation;DME and/or AE instruction;Therapeutic activities;Balance training;Patient/family education    OT Goals(Current goals can be found in the care plan section) Acute Rehab OT Goals Patient Stated Goal: to go home OT Goal Formulation: Patient unable to participate in goal setting Time For Goal Achievement: 10/17/21 Potential to Achieve Goals: Fair  OT Frequency: Min 2X/week    Co-evaluation              AM-PAC OT "6 Clicks" Daily Activity     Outcome Measure Help from another person eating meals?: A Little Help from another person taking care of personal grooming?: A Little  Help from another person toileting, which includes using toliet, bedpan, or urinal?: A Lot Help from another person bathing (including washing, rinsing, drying)?: A Lot Help from another person to put on and taking off regular upper body clothing?: A Little Help from another person to put on and taking off regular lower body clothing?: A Lot 6 Click Score: 15   End of Session Equipment Utilized  During Treatment: Gait belt;Rolling walker (2 wheels) Nurse Communication: Mobility status  Activity Tolerance: Patient tolerated treatment well Patient left: in chair;with call bell/phone within reach;with family/visitor present  OT Visit Diagnosis: Unsteadiness on feet (R26.81);Other abnormalities of gait and mobility (R26.89);Muscle weakness (generalized) (M62.81)                Time: 8101-7510 OT Time Calculation (min): 35 min Charges:  OT General Charges $OT Visit: 1 Visit OT Evaluation $OT Eval Moderate Complexity: 1 Mod OT Treatments $Self Care/Home Management : 8-22 mins Jackelyn Poling OTR/L, MS Acute Rehabilitation Department Office# [phone] Pager# [phone]   Marcellina Millin 10/03/2021, 4:16 PM

## 2021-10-03 NOTE — Progress Notes (Signed)
Echocardiogram 2D Echocardiogram has been performed.  Joette Catching 10/03/2021, 2:57 PM

## 2021-10-03 NOTE — TOC Progression Note (Addendum)
Transition of Care [facility]) - Progression Note    Patient Details  Name: Daniel Everett MRN: [id number] Date of Birth: Aug 21, 1931  Transition of Care [facility]) CM/SW Contact  Leeroy Cha, RN Phone Number: 10/03/2021, 1:39 PM  Clinical Narrative:    Tct daughter/message left on v.m. to please return call. Tcf-daughter- voiced her fears of patient being home alone and unsteady on his feet getting up out of a chair and with the walker.  Family will ber out of town next week.  Suggested that if he does not meet the criteria for snf rehab that perhaps they would like to get him a private duty sitter for when the home pt is not available.  Pt walked 70 feet with assistance and a rolling walker on [id number].  Expected Discharge Plan: [place] Barriers to Discharge: Continued Medical Work up  Expected Discharge Plan and Services Expected Discharge Plan: [place]   Discharge Planning Services: CM Consult   Living arrangements for the past 2 months: Single Family Home                                       Social Determinants of Health (SDOH) Interventions    Readmission Risk Interventions     No data to display

## 2021-10-04 DIAGNOSIS — G9340 Encephalopathy, unspecified: Secondary | ICD-10-CM | POA: Diagnosis not present

## 2021-10-04 LAB — ANA: Anti Nuclear Antibody (ANA): NEGATIVE

## 2021-10-04 NOTE — NC FL2 (Signed)
[place] LEVEL OF CARE SCREENING TOOL     IDENTIFICATION  Patient Name: Daniel Everett Birthdate: Aug 14, 1931 Sex: male Admission Date (Current Location): 10/01/2021  [facility] and [place] Number:  Herbalist and Address:  [facility],  [place], [place]      Provider Number: [id number]  Attending Physician Name and Address:  Oren Binet*  Relative Name and Phone Number:  Milus Glazier [phone]    Current Level of Care: Hospital Recommended Level of Care: [place] Prior Approval Number:    Date Approved/Denied:   PASRR Number: [id number]  Discharge Plan: SNF    Current Diagnoses: Patient Active Problem List   Diagnosis Date Noted   Leg swelling 10/02/2021   Acute on chronic diastolic CHF (congestive heart failure) (H[facility])    Acute encephalopathy 10/01/2021   Hydrocele 05/03/2020   Open-angle glaucoma 05/03/2020   Psychosexual dysfunction with inhibited sexual excitement 05/03/2020   Cortical senile cataract 05/03/2020   Senile nuclear sclerosis 05/03/2020   Melanoma of left side of neck ([place]) 01/29/2019   Anticoagulated by anticoagulation treatment 01/08/2019   Permanent atrial fibrillation ([place]) 08/20/2018   Peripheral neuropathy 04/08/2018   Gait abnormality 03/02/2018   Primary osteoarthritis of right knee 06/25/2017   Essential tremor 06/25/2016   Atrial fibrillation ([place]) 03/26/2016   GERD (gastroesophageal reflux disease) 02/08/2015   Nocturia 02/08/2015   Preventative health care 05/04/2010   GEN OSTEOARTHROSIS INVOLVING MULTIPLE SITES 01/03/2010   ONYCHOMYCOSIS 06/06/2009   HOARSENESS, CHRONIC 10/18/2008   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   ALLERGIC RHINITIS DUE TO POLLEN 06/07/2008   NEOPLASM, MALIGNANT, PROSTATE, HX OF, S/P TURP 05/28/2007   Hypothyroidism 11/27/2006   DIVERTICULOSIS, COLON 11/27/2006   Osteoarthritis 11/27/2006   Hyperlipidemia  10/28/2006   Essential hypertension 10/28/2006   History of colonic polyps 10/28/2006   Other and unspecified hyperlipidemia 03/25/1998    Orientation RESPIRATION BLADDER Height & Weight     Self, Time, Place, Situation  Normal Continent Weight: 84.9 kg Height:  '6\' 4"'$  (193 cm)  BEHAVIORAL SYMPTOMS/MOOD NEUROLOGICAL BOWEL NUTRITION STATUS      Continent Diet (regular)  AMBULATORY STATUS COMMUNICATION OF NEEDS Skin   Extensive Assist Verbally Normal                       Personal Care Assistance Level of Assistance  Bathing, Feeding, Dressing Bathing Assistance: Limited assistance Feeding assistance: Limited assistance Dressing Assistance: Limited assistance     Functional Limitations Info  Sight, Hearing, Speech Sight Info: Adequate Hearing Info: Adequate Speech Info: Adequate    SPECIAL CARE FACTORS FREQUENCY  PT (By licensed PT), OT (By licensed OT)     PT Frequency: 5 x weekly OT Frequency: 5 x weekly            Contractures      Additional Factors Info  Code Status Code Status Info: full             Current Medications (10/04/2021):  This is the current hospital active medication list Current Facility-Administered Medications  Medication Dose Route Frequency Provider Last Rate Last Admin   acetaminophen (TYLENOL) tablet 650 mg  650 mg Oral Q6H PRN Opyd, Ilene Qua, MD       Or   acetaminophen (TYLENOL) suppository 650 mg  650 mg Rectal Q6H PRN Opyd, Ilene Qua, MD       apixaban (ELIQUIS) tablet 5 mg  5 mg Oral BID Opyd, Christia Reading  S, MD   5 mg at 10/03/21 2146   atorvastatin (LIPITOR) tablet 20 mg  20 mg Oral QPM Opyd, Ilene Qua, MD   20 mg at 10/03/21 1759   finasteride (PROSCAR) tablet 5 mg  5 mg Oral Daily Opyd, Ilene Qua, MD   5 mg at 10/03/21 1011   gadobutrol (GADAVIST) 1 MMOL/ML injection 8 mL  8 mL Intravenous Once PRN Vashti Hey, MD       levothyroxine (SYNTHROID) tablet 75 mcg  75 mcg Oral Q0600 Vianne Bulls, MD   75 mcg at  10/04/21 0547   lisinopril (ZESTRIL) tablet 20 mg  20 mg Oral Daily Opyd, Ilene Qua, MD   20 mg at 10/03/21 1011   ondansetron (ZOFRAN) tablet 4 mg  4 mg Oral Q6H PRN Opyd, Ilene Qua, MD       Or   ondansetron (ZOFRAN) injection 4 mg  4 mg Intravenous Q6H PRN Opyd, Ilene Qua, MD       pantoprazole (PROTONIX) EC tablet 40 mg  40 mg Oral Daily Opyd, Ilene Qua, MD   40 mg at 10/03/21 1011   senna-docusate (Senokot-S) tablet 1 tablet  1 tablet Oral QHS PRN Opyd, Ilene Qua, MD   1 tablet at 10/03/21 1759   sodium chloride flush (NS) 0.9 % injection 3 mL  3 mL Intravenous Q12H Opyd, Ilene Qua, MD   3 mL at 10/03/21 2240   timolol (TIMOPTIC) 0.5 % ophthalmic solution 1 drop  1 drop Left Eye BID Opyd, Ilene Qua, MD   1 drop at 10/03/21 2146   topiramate (TOPAMAX) tablet 25 mg  25 mg Oral BID Vianne Bulls, MD   25 mg at 10/03/21 2146   triamcinolone cream (KENALOG) 0.1 % cream 1 Application  1 Application Topical BID Vianne Bulls, MD   1 Application at 09/32/67 2146     Discharge Medications: Please see discharge summary for a list of discharge medications.  Relevant Imaging Results:  Relevant Lab Results:   Additional Information [phone] NKDA  Leeroy Cha, RN

## 2021-10-04 NOTE — TOC Progression Note (Addendum)
Transition of Care [facility]) - Progression Note    Patient Details  Name: FAITH BRANAN MRN: [id number] Date of Birth: 10-22-1931  Transition of Care [facility]) CM/SW Contact  Leeroy Cha, RN Phone Number: 10/04/2021, 2:35 PM  Clinical Narrative:    Spoke with the daughters in the room.  Explianed to them that the patient is not skilled but is custodial and that [employer] most likely would not cover a snf stay for this.  Family is arranging home sitters for the patient.  Are willing to take him home on [phone].  They need time to get the house set up and arrange care.  Hhc-request for physical therapy sent to [facility] at 1430. 1500/text from [facility] with [facility]/will be able to take patient and do physical therapy.  Expected Discharge Plan: [place] Barriers to Discharge: Continued Medical Work up  Expected Discharge Plan and Services Expected Discharge Plan: [place]   Discharge Planning Services: CM Consult   Living arrangements for the past 2 months: Single Family Home                                       Social Determinants of Health (SDOH) Interventions    Readmission Risk Interventions     No data to display

## 2021-10-04 NOTE — Progress Notes (Signed)
PROGRESS NOTE    Daniel Everett  DVV:[id number]  DOB: Dec 09, 1931  DOA: 10/01/2021 PCP: [name], MD Outpatient Specialists:   Hospital course:  Daniel Everett is a pleasant 88 y.o. male with medical history significant for atrial fibrillation on Eliquis, severe bradycardia with pacemaker, hypothyroidism, hypertension, and chronic diastolic CHF was admitted 10/01/2021 for evaluation of confusion and leg swelling.    Subjective:  Long discussion with the patient's daughter Daniel Everett about management of dementia.  Reviewed MRI of head and L-spine which are negative for any acute findings.    I noted the patient is ready for discharge but family requested discharge tomorrow so they can find a sitter to stay with him at home.   Objective: Vitals:   10/04/21 0500 10/04/21 0538 10/04/21 0950 10/04/21 1415  BP:  (!) 145/97 120/79 107/80  Pulse:  (!) 58  65  Resp:  15  18  Temp:  97.6 F (36.4 C)  98 F (36.7 C)  TempSrc:  Oral  Oral  SpO2:  97%  97%  Weight: 84.9 kg     Height:        Intake/Output Summary (Last 24 hours) at 10/04/2021 1759 Last data filed at 10/04/2021 1417 Gross per 24 hour  Intake 600 ml  Output 1225 ml  Net -625 ml    Filed Weights   10/02/21 0116 10/03/21 0500 10/04/21 0500  Weight: 84.7 kg 84.3 kg 84.9 kg     Exam:  General: Patient sitting up in bed with attentive daughter-in-law at bedside.  Patient answers questions appropriately although clearly seems to have short-term memory loss. Eyes: sclera anicteric, conjuctiva mild injection bilaterally CVS: S1-S2, regular  Respiratory:  decreased air entry bilaterally secondary to decreased inspiratory effort, rales at bases  GI: NABS, soft, NT  LE: Patient has several erythematous patches on his lower extremities below his knees without surrounding erythema.  Has 1+ edema to above ankles.  Assessment & Plan:   Acute worsening of what sounds like baseline previously undiagnosed  dementia. MRI negative for any stroke Head CT shows no acute abnormality, mild chronic small vessel disease, mild to moderate cerebral atrophy and mild cerebellar atrophy. Ammonia level is normal TSH is normal at 3.7 RPR is negative. 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 Sounds like about 3 weeks ago something changed where he became much more forgetful and confused.  He possibly started having a gait abnormality and listing to the right at that time as well.  As discussed with patient's daughter, patient might benefit from outpatient referral to memory disorders clinic and geriatrics. Aricept and Namenda can be initiated as an outpatient.  Fall with new fecal incontinence Lumbar MRI is negative for any spinal cord or cauda equina impingement Fecal incontinence is likely functional, patient was able to tell caregivers in house when he needed to move his bowels and was able to get to the bathroom in time with assistance.  Rash on lower extremities bilaterally, NEW x 3-4 weeks Initial concern for vasculitis however sed rate CRP and ANA are negative which makes vasculitis much less likely. Rash markedly improved with improvement of lower extremity edema  LE edema Patient with right heart dysfunction on echocardiogram He also seems to have venous stasis disease We will discharge patient  on Lasix 20 mg as he came in on however he will likely need increased doses of Lasix. Also discussed keeping legs elevated as much as possible  Atrial fibrillation Rate controlled off rate controlling medication Continue Eliquis  Hypothyroidism Continue Synthroid  GERD Continue pantoprazole  Glaucoma Continue atenolol  Disposition POC/PT/OT eval requested   DVT prophylaxis: Eliquis Code Status:  Full Family Communication: Spoke with patient's daughter Daniel Everett at length. Disposition Plan:   Patient is from: Home  Anticipated Discharge Location: TBD  Barriers to Discharge: Ongoing work-up for subacute change in memory  Is patient medically stable for Discharge:  No   Scheduled Meds:  apixaban  5 mg Oral BID   atorvastatin  20 mg Oral QPM   finasteride  5 mg Oral Daily   levothyroxine  75 mcg Oral Q0600   lisinopril  20 mg Oral Daily   pantoprazole  40 mg Oral Daily   sodium chloride flush  3 mL Intravenous Q12H   timolol  1 drop Left Eye BID   topiramate  25 mg Oral BID   triamcinolone cream  1 Application Topical BID   Continuous Infusions:  Data Reviewed:  Basic Metabolic Panel: Recent Labs  Lab 10/01/21 1309 10/02/21 0411 10/03/21 0448  NA 139 141 141  K 3.7 3.5 3.4*  CL 106 108 109  CO2 '26 26 24  '$ GLUCOSE 106* 98 113*  BUN '21 21 22  '$ CREATININE 1.05 0.91 1.02  CALCIUM 8.9 8.5* 8.6*     CBC: Recent Labs  Lab 10/01/21 1309 10/02/21 0411  WBC 6.1 6.1  HGB 15.6 13.5  HCT 48.1 41.9  MCV 102.1* 103.2*  PLT 225 181     Studies: MR Lumbar Spine W Wo Contrast  Result Date: 10/04/2021 CLINICAL DATA:  Initial evaluation for low back pain, cauda equina syndrome suspected. EXAM: MRI LUMBAR SPINE WITHOUT AND WITH CONTRAST TECHNIQUE: Multiplanar and multiecho pulse sequences of the lumbar spine were obtained without and with intravenous contrast. CONTRAST:  44m GADAVIST GADOBUTROL 1 MMOL/ML IV SOLN COMPARISON:  Prior CT from 04/18/2021. FINDINGS: Segmentation: Standard. Lowest well-formed disc space labeled the L5-S1 level. Alignment: Trace 2 mm degenerative retrolisthesis of L2 on L3, with trace anterolisthesis of L3 on L4. Straightening of the normal lumbar lordosis with underlying mild roto sigmoid scoliosis. Vertebrae: Vertebral body height maintained without acute or chronic fracture. Bone marrow signal intensity diffusely heterogeneous. No discrete or worrisome  osseous lesions. No abnormal marrow edema or enhancement. Conus medullaris and cauda equina: Conus extends to the L1 level. Conus and cauda equina appear normal. Paraspinal and other soft tissues: Mild diffuse edema seen throughout the posterior soft tissues of the lower back, nonspecific, but could be related to overall volume status. No loculated collections. Few scattered simple cysts noted about the partially visualized kidneys, benign in appearance, no follow-up imaging recommended. Disc levels: L1-2: Degenerative intervertebral disc space narrowing with mild disc bulge. Prominent left extraforaminal reactive endplate spurring. Mild to moderate facet hypertrophy. No significant spinal stenosis. Mild left L1 foraminal narrowing. Right neural foramen is patent. L2-3: Trace retrolisthesis. Degenerative intervertebral disc space narrowing with diffuse disc bulge and disc desiccation. Disc bulging eccentric to the right. Associated reactive endplate spurring. Moderate bilateral facet hypertrophy. Resultant mild canal with moderate right lateral recess stenosis. Mild bilateral L2 foraminal narrowing. L3-4: Trace anterolisthesis. Mild disc bulge with disc desiccation. Reactive endplate spurring. Severe bilateral facet arthrosis. Resultant moderate to severe spinal stenosis. Mild bilateral L3 foraminal narrowing. L4-5: Degenerative intervertebral disc space  narrowing with disc desiccation and mild disc bulge. Reactive endplate spurring. Moderate bilateral facet arthrosis. Resultant mild canal with mild to moderate bilateral subarticular stenosis. Mild right with moderate left L4 foraminal stenosis. L5-S1: Degenerative intervertebral disc space narrowing with disc desiccation and diffuse disc bulge. Reactive endplate spurring. Moderate bilateral facet hypertrophy. No significant spinal stenosis. Moderate right worse than left L5 foraminal stenosis. IMPRESSION: 1. No acute abnormality within the lumbar spine. No evidence  for cord compression. 2. Moderate multilevel degenerative spondylosis and facet arthrosis as detailed above, most pronounced at L3-4 where there is resultant moderate to severe spinal stenosis. 3. Mild-to-moderate multilevel foraminal narrowing as above, moderate in nature on the left at L4 and bilaterally at L5. Electronically Signed   By: Jeannine Boga M.D.   On: 10/04/2021 03:00   MR BRAIN WO CONTRAST  Result Date: 10/03/2021 CLINICAL DATA:  Transient ischemic attack (TIA); possible cva 3 weeks ago EXAM: MRI HEAD WITHOUT CONTRAST TECHNIQUE: Multiplanar, multiecho pulse sequences of the brain and surrounding structures were obtained without intravenous contrast. COMPARISON:  None Available. FINDINGS: Brain: There is no acute infarction or intracranial hemorrhage. There is no intracranial mass, mass effect, or edema. There is no hydrocephalus or extra-axial fluid collection. Prominence of the ventricles and sulci reflects moderate parenchymal volume loss. Patchy T2 hyperintensity in the supratentorial white matter is nonspecific but may reflect mild to moderate chronic microvascular ischemic changes. Vascular: Major vessel flow voids at the skull base are preserved. Skull and upper cervical spine: Normal marrow signal is preserved. Sinuses/Orbits: Mild paranasal sinus mucosal thickening. Bilateral lens replacements. Other: Sella is unremarkable. Mastoid air cells are clear. Probable sebaceous cyst of the posterior scalp. IMPRESSION: No acute infarction, hemorrhage, or mass. Parenchymal volume loss and chronic microvascular ischemic changes. Electronically Signed   By: Macy Mis M.D.   On: 10/03/2021 17:57   ECHOCARDIOGRAM COMPLETE  Result Date: 10/03/2021    ECHOCARDIOGRAM REPORT   Patient Name:   Daniel Everett Date of Exam: 10/03/2021 Medical Rec #:  [id number]       Height:       76.0 in Accession #:    [id number]      Weight:       185.8 lb Date of Birth:  [birth year]       BSA:          2.147  m Patient Age:    48 years        BP:           97/64 mmHg Patient Gender: M               HR:           63 bpm. Exam Location:  Inpatient Procedure: 2D Echo, Color Doppler and Cardiac Doppler Indications:    Congestive heart failure  History:        Patient has prior history of Echocardiogram examinations, most                 recent 04/08/2016. CHF, Pacemaker, Arrythmias:Bradycardia; Risk                 Factors:Hypertension. Hypothyroidism.  Sonographer:    Joette Catching RCS Referring Phys: [phone] TIMOTHY S OPYD  Sonographer Comments: Image acquisition challenging due to patient body habitus and Supine study. IMPRESSIONS  1. Left ventricular ejection fraction, by estimation, is 60 to 65%. The left ventricle has normal function. The left ventricle has no regional wall motion abnormalities. Left ventricular diastolic function  could not be evaluated.  2. Right ventricular systolic function is normal. The right ventricular size is moderately enlarged. There is mildly elevated pulmonary artery systolic pressure. The estimated right ventricular systolic pressure is 81.4 mmHg.  3. Left atrial size was severely dilated.  4. Right atrial size was severely dilated.  5. The mitral valve is normal in structure. Trivial mitral valve regurgitation. No evidence of mitral stenosis.  6. The aortic valve is tricuspid. Aortic valve regurgitation is mild. Aortic valve sclerosis/calcification is present, without any evidence of aortic stenosis. Aortic regurgitation PHT measures 699 msec. Aortic valve area, by VTI measures 2.87 cm. Aortic valve mean gradient measures 3.0 mmHg. Aortic valve Vmax measures 1.18 m/s.  7. Aortic dilatation noted. There is mild dilatation of the aortic root, measuring 42 mm.  8. The inferior vena cava is dilated in size with <50% respiratory variability, suggesting right atrial pressure of 15 mmHg. FINDINGS  Left Ventricle: Left ventricular ejection fraction, by estimation, is 60 to 65%. The left  ventricle has normal function. The left ventricle has no regional wall motion abnormalities. The left ventricular internal cavity size was normal in size. There is  no left ventricular hypertrophy. Abnormal (paradoxical) septal motion, consistent with left bundle branch block. Left ventricular diastolic function could not be evaluated due to paced rhythm. Left ventricular diastolic function could not be evaluated. Right Ventricle: The right ventricular size is moderately enlarged. No increase in right ventricular wall thickness. Right ventricular systolic function is normal. There is mildly elevated pulmonary artery systolic pressure. The tricuspid regurgitant velocity is 2.41 m/s, and with an assumed right atrial pressure of 15 mmHg, the estimated right ventricular systolic pressure is 48.1 mmHg. Left Atrium: Left atrial size was severely dilated. Right Atrium: Right atrial size was severely dilated. Pericardium: There is no evidence of pericardial effusion. Mitral Valve: The mitral valve is normal in structure. Trivial mitral valve regurgitation. No evidence of mitral valve stenosis. Tricuspid Valve: The tricuspid valve is normal in structure. Tricuspid valve regurgitation is mild . No evidence of tricuspid stenosis. Aortic Valve: The aortic valve is tricuspid. Aortic valve regurgitation is mild. Aortic regurgitation PHT measures 699 msec. Aortic valve sclerosis/calcification is present, without any evidence of aortic stenosis. Aortic valve mean gradient measures 3.0  mmHg. Aortic valve peak gradient measures 5.5 mmHg. Aortic valve area, by VTI measures 2.87 cm. Pulmonic Valve: The pulmonic valve was normal in structure. Pulmonic valve regurgitation is trivial. No evidence of pulmonic stenosis. Aorta: Aortic dilatation noted. There is mild dilatation of the aortic root, measuring 42 mm. Venous: The inferior vena cava is dilated in size with less than 50% respiratory variability, suggesting right atrial pressure  of 15 mmHg. IAS/Shunts: No atrial level shunt detected by color flow Doppler.  LEFT VENTRICLE PLAX 2D LVIDd:         5.00 cm     Diastology LVIDs:         3.90 cm     LV e' medial:    6.42 cm/s LV PW:         1.00 cm     LV E/e' medial:  10.9 LV IVS:        1.00 cm     LV e' lateral:   7.29 cm/s LVOT diam:     2.20 cm     LV E/e' lateral: 9.6 LV SV:         59 LV SV Index:   27 LVOT Area:     3.80 cm  LV Volumes (MOD) LV vol d, MOD A2C: 67.5 ml LV vol d, MOD A4C: 68.8 ml LV vol s, MOD A2C: 22.6 ml LV vol s, MOD A4C: 31.7 ml LV SV MOD A2C:     44.9 ml LV SV MOD A4C:     68.8 ml LV SV MOD BP:      41.4 ml RIGHT VENTRICLE             IVC RV Basal diam:  4.60 cm     IVC diam: 2.60 cm RV Mid diam:    2.40 cm RV S prime:     17.10 cm/s TAPSE (M-mode): 1.9 cm LEFT ATRIUM              Index        RIGHT ATRIUM           Index LA diam:        4.80 cm  2.24 cm/m   RA Area:     36.40 cm LA Vol (A2C):   133.0 ml 61.94 ml/m  RA Volume:   140.00 ml 65.20 ml/m LA Vol (A4C):   90.9 ml  42.33 ml/m LA Biplane Vol: 110.0 ml 51.23 ml/m  AORTIC VALVE AV Area (Vmax):    2.74 cm AV Area (Vmean):   2.64 cm AV Area (VTI):     2.87 cm AV Vmax:           117.50 cm/s AV Vmean:          77.700 cm/s AV VTI:            0.205 m AV Peak Grad:      5.5 mmHg AV Mean Grad:      3.0 mmHg LVOT Vmax:         84.80 cm/s LVOT Vmean:        54.000 cm/s LVOT VTI:          0.155 m LVOT/AV VTI ratio: 0.76 AI PHT:            699 msec  AORTA Ao Root diam: 4.20 cm MITRAL VALVE               TRICUSPID VALVE MV Area (PHT): 3.39 cm    TR Peak grad:   23.2 mmHg MV Decel Time: 224 msec    TR Vmax:        241.00 cm/s MR Peak grad: 78.1 mmHg MR Vmax:      442.00 cm/s  SHUNTS MV E velocity: 69.80 cm/s  Systemic VTI:  0.16 m MV A velocity: 26.10 cm/s  Systemic Diam: 2.20 cm MV E/A ratio:  2.67 Tressia Miners Turner MD Electronically signed by Fransico Him MD Signature Date/Time: 10/03/2021/3:19:32 PM    Final     Principal Problem:   Acute encephalopathy Active  Problems:   Hypothyroidism   Permanent atrial fibrillation (HCC)   Leg swelling   Acute on chronic diastolic CHF (congestive heart failure) ([place])     [place] [employer] Tublu Ignace Mandigo, [facility]  If 7PM-7AM, please contact night-coverage [email or web address]   LOS: 0 days

## 2021-10-04 NOTE — TOC Progression Note (Addendum)
Transition of Care [facility]) - Progression Note    Patient Details  Name: Daniel Everett MRN: [id number] Date of Birth: 1931-08-20  Transition of Care [facility] CM/SW Contact  Leeroy Cha, RN Phone Number: 10/04/2021, 8:51 AM  Clinical Narrative:    [phone] sent out to all areas snf's for review. [id number]/tct-message left for daughter to return call.  Expected Discharge Plan: [place] Barriers to Discharge: Continued Medical Work up  Expected Discharge Plan and Services Expected Discharge Plan: [place]   Discharge Planning Services: CM Consult   Living arrangements for the past 2 months: Single Family Home                                       Social Determinants of Health (SDOH) Interventions    Readmission Risk Interventions     No data to display

## 2021-10-05 ENCOUNTER — Telehealth: Payer: Self-pay | Admitting: Family Medicine

## 2021-10-05 DIAGNOSIS — G9340 Encephalopathy, unspecified: Secondary | ICD-10-CM | POA: Diagnosis not present

## 2021-10-05 MED ORDER — MUPIROCIN 2 % EX OINT: 1.0000 | TOPICAL_OINTMENT | Freq: Two times a day (BID) | CUTANEOUS | 3 refills | Status: AC

## 2021-10-05 NOTE — Telephone Encounter (Signed)
Patient daughter dropped off paperwork to be filled out by Dr.Burchette today. Patient will be discharged today and needs to go into one week respite care after he leaves the hospital. I did let her know of the turn around time but she is hoping that due to urgency of situation it can be completed and given back today.     Good callback number for Katharine Look is [phone]   Please advise

## 2021-10-05 NOTE — Telephone Encounter (Signed)
Paperwork placed in folder FYI

## 2021-10-05 NOTE — Progress Notes (Signed)
AVS given to patient and explained at the bedside with the pt's family. Medications and follow up appointments have been explained with pt and their family verbalizing understanding.

## 2021-10-05 NOTE — Discharge Summary (Addendum)
Physician Discharge Summary   Patient: Daniel Everett MRN: [id number] DOB: [birth year]  Admit date:     10/01/2021  Discharge date: 10/05/21  Discharge Physician: Vanna Scotland   PCP: [name], MD   Recommendations at discharge:     Discharge Diagnoses: Principal Problem:   Acute encephalopathy Active Problems:   Hypothyroidism   Permanent atrial fibrillation (HCC)   Leg swelling   Acute on chronic diastolic CHF (congestive heart failure) [facility])  Hospital Course: Daniel Everett is a pleasant 88 y.o. male with medical history significant for atrial fibrillation on Eliquis, severe bradycardia with pacemaker, hypothyroidism, hypertension, and chronic diastolic CHF was admitted 10/01/2021 for evaluation of confusion and leg swelling.  Family requesting DC date 7/14 as they are searching for sitter for patient.  Assessment and Plan: Acute worsening of what sounds like baseline previously undiagnosed dementia. MRI negative for any stroke Head CT shows no acute abnormality, mild chronic small vessel disease, mild to moderate cerebral atrophy and mild cerebellar atrophy. Ammonia level is normal TSH is normal at 3.7 RPR is negative. 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 Sounds like about 3 weeks ago something changed where he became much more forgetful and confused.  He possibly started having a gait abnormality and listing to the right at that time as well.  - As discussed with patient's daughter, patient might benefit from outpatient referral to memory disorders clinic and geriatrics. - Aricept and Namenda can be initiated as an outpatient.   Fall with new fecal incontinence Lumbar MRI is negative for any spinal cord or cauda equina impingement Fecal incontinence is likely functional,  patient was able to tell caregivers in house when he needed to move his bowels and was able to get to the bathroom in time with assistance.   Rash on lower extremities bilaterally, NEW x 3-4 weeks Initial concern for vasculitis however sed rate CRP and ANA are negative which makes vasculitis much less likely. - Rash markedly improved with improvement of lower extremity edema   LE edema Patient with right heart dysfunction on echocardiogram He also seems to have venous stasis disease We will discharge patient on Lasix 20 mg as he came in on however he will likely need increased doses of Lasix. Also discussed keeping legs elevated as much as possible   Atrial fibrillation Rate controlled off rate controlling medication Continue Eliquis   Hypothyroidism Continue Synthroid   GERD Continue pantoprazole   Glaucoma Continue atenolol    Disposition: Home Diet recommendation:  Discharge Diet Orders (From admission, onward)     Start     Ordered   10/05/21 0000  Diet - low sodium heart healthy        10/05/21 0651            DISCHARGE MEDICATION: Allergies as of 10/05/2021   No Known Allergies      Medication List     TAKE these medications    acetaminophen 650 MG CR tablet Commonly known as: TYLENOL Take 650 mg by mouth 2 (two) times a day.   apixaban 5 MG Tabs tablet Commonly known as: Eliquis Take 1 tablet (5 mg total) by mouth 2 (two) times daily.   atorvastatin 20 MG tablet Commonly known as: LIPITOR TAKE 1 TABLET DAILY What changed:  when to take this   finasteride 5 MG tablet Commonly known as: PROSCAR TAKE 1 TABLET DAILY   furosemide 20 MG tablet Commonly known as: LASIX Take 1 tablet (20 mg total) by mouth daily.   levothyroxine 75 MCG tablet Commonly known as: SYNTHROID TAKE 1 TABLET DAILY What changed: when to take this   lisinopril 20 MG tablet Commonly known as: ZESTRIL TAKE 1 TABLET DAILY   mupirocin ointment 2 % Commonly known as:  BACTROBAN Apply 1 Application topically 2 (two) times daily for 14 days.   omeprazole 20 MG capsule Commonly known as: PRILOSEC Take 1 capsule (20 mg total) by mouth daily.   pyridOXINE 100 MG tablet Commonly known as: VITAMIN B-6 Take 100 mg by mouth daily.   timolol 0.5 % ophthalmic solution Commonly known as: TIMOPTIC Place 1 drop into the left eye 2 (two) times daily.   topiramate 25 MG tablet Commonly known as: TOPAMAX TAKE 1 TABLET BY MOUTH TWICE A DAY   triamcinolone cream 0.1 % Commonly known as: KENALOG Apply 1 Application topically 2 (two) times daily. What changed: additional instructions   Vitamin D3 50 MCG (2000 UT) Tabs Take 2,000 Units by mouth daily.        Follow-up Information     [name], MD Follow up in 1 week(s).   Specialty: Family Medicine Contact information: [place] [address] [phone]         Constance Haw, MD .   Specialty: Cardiology Contact information: [address] [place] [address] [phone]         [place], Peter M, MD .   Specialty: Cardiology Contact information: [address] [place] [address] [phone]                Discharge Exam: Danley Danker Weights   10/02/21 0116 10/03/21 0500 10/04/21 0500  Weight: 84.7 kg 84.3 kg 84.9 kg      General: Patient sitting up in bed with attentive daughter-in-law at bedside.  Patient answers questions appropriately although clearly seems to have short-term memory loss. Eyes: sclera anicteric, conjuctiva mild injection bilaterally CVS: S1-S2, regular  Respiratory:  decreased air entry bilaterally secondary to decreased inspiratory effort, rales at bases  GI: NABS, soft, NT  LE: Patient has several erythematous patches on his lower extremities below his knees without surrounding erythema.  Has 1+ edema to above ankles.  Condition at discharge: fair  The results of significant diagnostics  from this hospitalization (including imaging, microbiology, ancillary and laboratory) are listed below for reference.   Imaging Studies: MR Lumbar Spine W Wo Contrast  Result Date: 10/04/2021 CLINICAL DATA:  Initial evaluation for low back pain, cauda equina syndrome suspected. EXAM: MRI LUMBAR SPINE WITHOUT AND WITH CONTRAST TECHNIQUE: Multiplanar and multiecho pulse sequences of the lumbar spine were obtained without and with intravenous contrast. CONTRAST:  31m GADAVIST GADOBUTROL 1 MMOL/ML IV SOLN COMPARISON:  Prior CT from 04/18/2021. FINDINGS: Segmentation: Standard. Lowest well-formed disc space labeled the L5-S1 level. Alignment: Trace 2 mm degenerative retrolisthesis of L2 on L3, with trace anterolisthesis of L3 on L4. Straightening of the normal lumbar lordosis with underlying mild roto sigmoid scoliosis. Vertebrae: Vertebral body height maintained without acute or chronic fracture. Bone marrow signal intensity diffusely heterogeneous. No discrete or worrisome osseous lesions. No abnormal marrow edema or enhancement. Conus medullaris and cauda equina: Conus extends to the L1 level. Conus and cauda equina appear normal. Paraspinal and other soft  tissues: Mild diffuse edema seen throughout the posterior soft tissues of the lower back, nonspecific, but could be related to overall volume status. No loculated collections. Few scattered simple cysts noted about the partially visualized kidneys, benign in appearance, no follow-up imaging recommended. Disc levels: L1-2: Degenerative intervertebral disc space narrowing with mild disc bulge. Prominent left extraforaminal reactive endplate spurring. Mild to moderate facet hypertrophy. No significant spinal stenosis. Mild left L1 foraminal narrowing. Right neural foramen is patent. L2-3: Trace retrolisthesis. Degenerative intervertebral disc space narrowing with diffuse disc bulge and disc desiccation. Disc bulging eccentric to the right. Associated reactive  endplate spurring. Moderate bilateral facet hypertrophy. Resultant mild canal with moderate right lateral recess stenosis. Mild bilateral L2 foraminal narrowing. L3-4: Trace anterolisthesis. Mild disc bulge with disc desiccation. Reactive endplate spurring. Severe bilateral facet arthrosis. Resultant moderate to severe spinal stenosis. Mild bilateral L3 foraminal narrowing. L4-5: Degenerative intervertebral disc space narrowing with disc desiccation and mild disc bulge. Reactive endplate spurring. Moderate bilateral facet arthrosis. Resultant mild canal with mild to moderate bilateral subarticular stenosis. Mild right with moderate left L4 foraminal stenosis. L5-S1: Degenerative intervertebral disc space narrowing with disc desiccation and diffuse disc bulge. Reactive endplate spurring. Moderate bilateral facet hypertrophy. No significant spinal stenosis. Moderate right worse than left L5 foraminal stenosis. IMPRESSION: 1. No acute abnormality within the lumbar spine. No evidence for cord compression. 2. Moderate multilevel degenerative spondylosis and facet arthrosis as detailed above, most pronounced at L3-4 where there is resultant moderate to severe spinal stenosis. 3. Mild-to-moderate multilevel foraminal narrowing as above, moderate in nature on the left at L4 and bilaterally at L5. Electronically Signed   By: Jeannine Boga M.D.   On: 10/04/2021 03:00   MR BRAIN WO CONTRAST  Result Date: 10/03/2021 CLINICAL DATA:  Transient ischemic attack (TIA); possible cva 3 weeks ago EXAM: MRI HEAD WITHOUT CONTRAST TECHNIQUE: Multiplanar, multiecho pulse sequences of the brain and surrounding structures were obtained without intravenous contrast. COMPARISON:  None Available. FINDINGS: Brain: There is no acute infarction or intracranial hemorrhage. There is no intracranial mass, mass effect, or edema. There is no hydrocephalus or extra-axial fluid collection. Prominence of the ventricles and sulci reflects  moderate parenchymal volume loss. Patchy T2 hyperintensity in the supratentorial white matter is nonspecific but may reflect mild to moderate chronic microvascular ischemic changes. Vascular: Major vessel flow voids at the skull base are preserved. Skull and upper cervical spine: Normal marrow signal is preserved. Sinuses/Orbits: Mild paranasal sinus mucosal thickening. Bilateral lens replacements. Other: Sella is unremarkable. Mastoid air cells are clear. Probable sebaceous cyst of the posterior scalp. IMPRESSION: No acute infarction, hemorrhage, or mass. Parenchymal volume loss and chronic microvascular ischemic changes. Electronically Signed   By: Macy Mis M.D.   On: 10/03/2021 17:57   ECHOCARDIOGRAM COMPLETE  Result Date: 10/03/2021    ECHOCARDIOGRAM REPORT   Patient Name:   Daniel Everett Date of Exam: 10/03/2021 Medical Rec #:  [id number]       Height:       76.0 in Accession #:    [id number]      Weight:       185.8 lb Date of Birth:  1932-03-22       BSA:          2.147 m Patient Age:    88 years        BP:           97/64 mmHg Patient Gender: M  HR:           63 bpm. Exam Location:  Inpatient Procedure: 2D Echo, Color Doppler and Cardiac Doppler Indications:    Congestive heart failure  History:        Patient has prior history of Echocardiogram examinations, most                 recent 04/08/2016. CHF, Pacemaker, Arrythmias:Bradycardia; Risk                 Factors:Hypertension. Hypothyroidism.  Sonographer:    Joette Catching RCS Referring Phys: [phone] TIMOTHY S OPYD  Sonographer Comments: Image acquisition challenging due to patient body habitus and Supine study. IMPRESSIONS  1. Left ventricular ejection fraction, by estimation, is 60 to 65%. The left ventricle has normal function. The left ventricle has no regional wall motion abnormalities. Left ventricular diastolic function could not be evaluated.  2. Right ventricular systolic function is normal. The right ventricular size is  moderately enlarged. There is mildly elevated pulmonary artery systolic pressure. The estimated right ventricular systolic pressure is 61.6 mmHg.  3. Left atrial size was severely dilated.  4. Right atrial size was severely dilated.  5. The mitral valve is normal in structure. Trivial mitral valve regurgitation. No evidence of mitral stenosis.  6. The aortic valve is tricuspid. Aortic valve regurgitation is mild. Aortic valve sclerosis/calcification is present, without any evidence of aortic stenosis. Aortic regurgitation PHT measures 699 msec. Aortic valve area, by VTI measures 2.87 cm. Aortic valve mean gradient measures 3.0 mmHg. Aortic valve Vmax measures 1.18 m/s.  7. Aortic dilatation noted. There is mild dilatation of the aortic root, measuring 42 mm.  8. The inferior vena cava is dilated in size with <50% respiratory variability, suggesting right atrial pressure of 15 mmHg. FINDINGS  Left Ventricle: Left ventricular ejection fraction, by estimation, is 60 to 65%. The left ventricle has normal function. The left ventricle has no regional wall motion abnormalities. The left ventricular internal cavity size was normal in size. There is  no left ventricular hypertrophy. Abnormal (paradoxical) septal motion, consistent with left bundle branch block. Left ventricular diastolic function could not be evaluated due to paced rhythm. Left ventricular diastolic function could not be evaluated. Right Ventricle: The right ventricular size is moderately enlarged. No increase in right ventricular wall thickness. Right ventricular systolic function is normal. There is mildly elevated pulmonary artery systolic pressure. The tricuspid regurgitant velocity is 2.41 m/s, and with an assumed right atrial pressure of 15 mmHg, the estimated right ventricular systolic pressure is 07.3 mmHg. Left Atrium: Left atrial size was severely dilated. Right Atrium: Right atrial size was severely dilated. Pericardium: There is no evidence of  pericardial effusion. Mitral Valve: The mitral valve is normal in structure. Trivial mitral valve regurgitation. No evidence of mitral valve stenosis. Tricuspid Valve: The tricuspid valve is normal in structure. Tricuspid valve regurgitation is mild . No evidence of tricuspid stenosis. Aortic Valve: The aortic valve is tricuspid. Aortic valve regurgitation is mild. Aortic regurgitation PHT measures 699 msec. Aortic valve sclerosis/calcification is present, without any evidence of aortic stenosis. Aortic valve mean gradient measures 3.0  mmHg. Aortic valve peak gradient measures 5.5 mmHg. Aortic valve area, by VTI measures 2.87 cm. Pulmonic Valve: The pulmonic valve was normal in structure. Pulmonic valve regurgitation is trivial. No evidence of pulmonic stenosis. Aorta: Aortic dilatation noted. There is mild dilatation of the aortic root, measuring 42 mm. Venous: The inferior vena cava is dilated in size with less  than 50% respiratory variability, suggesting right atrial pressure of 15 mmHg. IAS/Shunts: No atrial level shunt detected by color flow Doppler.  LEFT VENTRICLE PLAX 2D LVIDd:         5.00 cm     Diastology LVIDs:         3.90 cm     LV e' medial:    6.42 cm/s LV PW:         1.00 cm     LV E/e' medial:  10.9 LV IVS:        1.00 cm     LV e' lateral:   7.29 cm/s LVOT diam:     2.20 cm     LV E/e' lateral: 9.6 LV SV:         59 LV SV Index:   27 LVOT Area:     3.80 cm  LV Volumes (MOD) LV vol d, MOD A2C: 67.5 ml LV vol d, MOD A4C: 68.8 ml LV vol s, MOD A2C: 22.6 ml LV vol s, MOD A4C: 31.7 ml LV SV MOD A2C:     44.9 ml LV SV MOD A4C:     68.8 ml LV SV MOD BP:      41.4 ml RIGHT VENTRICLE             IVC RV Basal diam:  4.60 cm     IVC diam: 2.60 cm RV Mid diam:    2.40 cm RV S prime:     17.10 cm/s TAPSE (M-mode): 1.9 cm LEFT ATRIUM              Index        RIGHT ATRIUM           Index LA diam:        4.80 cm  2.24 cm/m   RA Area:     36.40 cm LA Vol (A2C):   133.0 ml 61.94 ml/m  RA Volume:   140.00 ml  65.20 ml/m LA Vol (A4C):   90.9 ml  42.33 ml/m LA Biplane Vol: 110.0 ml 51.23 ml/m  AORTIC VALVE AV Area (Vmax):    2.74 cm AV Area (Vmean):   2.64 cm AV Area (VTI):     2.87 cm AV Vmax:           117.50 cm/s AV Vmean:          77.700 cm/s AV VTI:            0.205 m AV Peak Grad:      5.5 mmHg AV Mean Grad:      3.0 mmHg LVOT Vmax:         84.80 cm/s LVOT Vmean:        54.000 cm/s LVOT VTI:          0.155 m LVOT/AV VTI ratio: 0.76 AI PHT:            699 msec  AORTA Ao Root diam: 4.20 cm MITRAL VALVE               TRICUSPID VALVE MV Area (PHT): 3.39 cm    TR Peak grad:   23.2 mmHg MV Decel Time: 224 msec    TR Vmax:        241.00 cm/s MR Peak grad: 78.1 mmHg MR Vmax:      442.00 cm/s  SHUNTS MV E velocity: 69.80 cm/s  Systemic VTI:  0.16 m MV A velocity: 26.10 cm/s  Systemic Diam: 2.20 cm MV E/A  ratio:  2.67 Fransico Him MD Electronically signed by Fransico Him MD Signature Date/Time: 10/03/2021/3:19:32 PM    Final    CT HEAD WO CONTRAST (5MM)  Result Date: 10/01/2021 CLINICAL DATA:  Provided history: Delirium. Additional history provided: Leg swelling, incontinence, altered mental status. EXAM: CT HEAD WITHOUT CONTRAST TECHNIQUE: Contiguous axial images were obtained from the base of the skull through the vertex without intravenous contrast. RADIATION DOSE REDUCTION: This exam was performed according to the departmental dose-optimization program which includes automated exposure control, adjustment of the mA and/or kV according to patient size and/or use of iterative reconstruction technique. COMPARISON:  No pertinent prior exams available for comparison. FINDINGS: Brain: Mild-to-moderate generalized cerebral atrophy. Mild cerebellar atrophy. Mild patchy and ill-defined hypoattenuation within the cerebral white matter, nonspecific but compatible chronic small vessel ischemic disease. There is no acute intracranial hemorrhage. No demarcated cortical infarct. No extra-axial fluid collection. No evidence of  an intracranial mass. No midline shift. Vascular: No hyperdense vessel.  Atherosclerotic calcifications. Skull: No fracture or aggressive osseous lesion. Sinuses/Orbits: No mass or acute finding within the imaged orbits. Moderate partial opacification of the left maxillary sinus. Mild mucosal thickening within the bilateral ethmoid sinuses. IMPRESSION: 1. No evidence of acute intracranial abnormality. 2. Mild chronic small vessel ischemic changes within the cerebral white matter. 3. Mild-to-moderate cerebral atrophy. 4. Mild cerebellar atrophy. 5. Paranasal sinus disease at the imaged levels, as described. 6. Nonspecific 2.9 x 1.0 cm cutaneous/subcutaneous occipital scalp lesion. Direct visualization recommended. Electronically Signed   By: Kellie Simmering D.O.   On: 10/01/2021 13:04   DG Chest 2 View  Result Date: 10/01/2021 CLINICAL DATA:  Altered mental status, lower extremity swelling EXAM: CHEST - 2 VIEW COMPARISON:  09/14/2021 chest radiograph. FINDINGS: Stable single lead left subclavian pacemaker with lead tip overlying the right ventricular apex. Stable cardiomediastinal silhouette with mild cardiomegaly. No pneumothorax. No pleural effusion. No pulmonary edema. No acute consolidative airspace disease. Chronic mild eventration of the left hemidiaphragm. IMPRESSION: Stable mild cardiomegaly without pulmonary edema. No acute pulmonary disease. Electronically Signed   By: Ilona Sorrel M.D.   On: 10/01/2021 12:57   CUP PACEART REMOTE DEVICE CHECK  Result Date: 09/19/2021 Scheduled remote reviewed. Normal device function.  VS<2:1, stable trend Next remote 91 days. LA  DG Chest 2 View  Result Date: 09/14/2021 CLINICAL DATA:  Hypovolemia, history of atrial fibrillation EXAM: CHEST - 2 VIEW COMPARISON:  09/11/2021 FINDINGS: Transverse diameter of heart is increased. Thoracic aorta is tortuous and ectatic. Increase in AP diameter of chest and low position of diaphragms suggest COPD. There are no signs of  pulmonary edema or new focal infiltrates. There is no pleural effusion or pneumothorax. Pacemaker battery is seen in the left infraclavicular region. IMPRESSION: Cardiomegaly. COPD. There are no signs of pulmonary edema or focal pulmonary consolidation. Electronically Signed   By: Elmer Picker M.D.   On: 09/14/2021 17:37   DG Ribs Unilateral Left  Result Date: 09/12/2021 CLINICAL DATA:  Fall.  Left posterior rib pain. EXAM: LEFT RIBS - 2 VIEW COMPARISON:  CT 04/18/2021. FINDINGS: Diffuse osteopenia. No evidence of displaced rib fracture or pneumothorax. Degenerative change thoracic spine and left shoulder. Cardiac pacer noted with lead tip over the right ventricle. Cardiomegaly noted. Interposition of the colon under the left hemidiaphragm noted. IMPRESSION: No evidence of displaced rib fracture or pneumothorax. Electronically Signed   By: Marcello Moores  Register M.D.   On: 09/12/2021 09:48   DG Shoulder Left  Result Date: 09/12/2021 CLINICAL DATA:  Recent fall with LEFT shoulder pain EXAM: LEFT SHOULDER - 2+ VIEW COMPARISON:  None available. FINDINGS: Osseous alignment is within normal limits. No fracture line or displaced fracture fragment is seen. Mild degenerative change at the glenohumeral and acromioclavicular joint spaces. Surrounding soft tissues are unremarkable. IMPRESSION: 1. No acute findings. No osseous fracture or dislocation. 2. Mild degenerative change. Electronically Signed   By: Franki Cabot M.D.   On: 09/12/2021 08:04    Microbiology: Results for orders placed or performed during the hospital encounter of 10/01/21  SARS Coronavirus 2 by RT PCR (hospital order, performed in [facility] hospital lab) *cepheid single result test* Anterior Nasal Swab     Status: None   Collection Time: 10/01/21  7:49 PM   Specimen: Anterior Nasal Swab  Result Value Ref Range Status   SARS Coronavirus 2 by RT PCR NEGATIVE NEGATIVE Final    Comment: (NOTE) SARS-CoV-2 target nucleic acids are NOT  DETECTED.  The SARS-CoV-2 RNA is generally detectable in upper and lower respiratory specimens during the acute phase of infection. The lowest concentration of SARS-CoV-2 viral copies this assay can detect is 250 copies / mL. A negative result does not preclude SARS-CoV-2 infection and should not be used as the sole basis for treatment or other patient management decisions.  A negative result may occur with improper specimen collection / handling, submission of specimen other than nasopharyngeal swab, presence of viral mutation(s) within the areas targeted by this assay, and inadequate number of viral copies (<250 copies / mL). A negative result must be combined with clinical observations, patient history, and epidemiological information.  Fact Sheet for Patients:   https://www.patel.info/  Fact Sheet for Healthcare Providers: https://hall.com/  This test is not yet approved or  cleared by the [place] FDA and has been authorized for detection and/or diagnosis of SARS-CoV-2 by FDA under an Emergency Use Authorization (EUA).  This EUA will remain in effect (meaning this test can be used) for the duration of the COVID-19 declaration under Section 564(b)(1) of the Act, 21 U.S.C. section 360bbb-3(b)(1), unless the authorization is terminated or revoked sooner.  Performed at [facility], [place] [address], [place], [place] [address]     Labs: CBC: Recent Labs  Lab 10/01/21 1309 10/02/21 0411  WBC 6.1 6.1  HGB 15.6 13.5  HCT 48.1 41.9  MCV 102.1* 103.2*  PLT 225 762   Basic Metabolic Panel: Recent Labs  Lab 10/01/21 1309 10/02/21 0411 10/03/21 0448  NA 139 141 141  K 3.7 3.5 3.4*  CL 106 108 109  CO2 '26 26 24  '$ GLUCOSE 106* 98 113*  BUN '21 21 22  '$ CREATININE 1.05 0.91 1.02  CALCIUM 8.9 8.5* 8.6*   Liver Function Tests: Recent Labs  Lab 10/01/21 1309 10/02/21 0411  AST 46* 35  ALT 47* 35  ALKPHOS  58 48  BILITOT 1.1 1.1  PROT 6.8 5.5*  ALBUMIN 3.7 3.0*   CBG: No results for input(s): "GLUCAP" in the last 168 hours.  Discharge time spent: less than 30 minutes.  Signed: Vanna Scotland, MD [facility] 10/05/2021

## 2021-10-05 NOTE — Telephone Encounter (Signed)
Pt's daughter informed of completion and forms placed in front office for pickup

## 2021-10-09 ENCOUNTER — Encounter: Payer: Self-pay | Admitting: Family Medicine

## 2021-10-09 ENCOUNTER — Ambulatory Visit (INDEPENDENT_AMBULATORY_CARE_PROVIDER_SITE_OTHER): Payer: Medicare HMO | Admitting: Family Medicine

## 2021-10-09 ENCOUNTER — Telehealth: Payer: Self-pay | Admitting: Family Medicine

## 2021-10-09 VITALS — BP 130/86 | HR 60 | Temp 97.3°F | Ht 76.0 in | Wt 186.9 lb

## 2021-10-09 DIAGNOSIS — I10 Essential (primary) hypertension: Secondary | ICD-10-CM

## 2021-10-09 DIAGNOSIS — G9340 Encephalopathy, unspecified: Secondary | ICD-10-CM

## 2021-10-09 DIAGNOSIS — R531 Weakness: Secondary | ICD-10-CM | POA: Diagnosis not present

## 2021-10-09 DIAGNOSIS — I4821 Permanent atrial fibrillation: Secondary | ICD-10-CM | POA: Diagnosis not present

## 2021-10-09 DIAGNOSIS — Z111 Encounter for screening for respiratory tuberculosis: Secondary | ICD-10-CM

## 2021-10-09 DIAGNOSIS — E038 Other specified hypothyroidism: Secondary | ICD-10-CM

## 2021-10-09 NOTE — Telephone Encounter (Signed)
Pt daughter is calling and would like a callback when tb blood result are back

## 2021-10-09 NOTE — Telephone Encounter (Signed)
Noted one results have been viewed by PCP pt will be contacted.

## 2021-10-09 NOTE — Progress Notes (Signed)
Established Patient Office Visit  Subjective   Patient ID: Daniel Everett, male    DOB: 01-30-1932  Age: 88 y.o. MRN: [id number]  Chief Complaint  Patient presents with   Hospitalization Follow-up    HPI   Here for hospital follow-up accompanied by daughter.  He presented with acute encephalopathy in the setting of some chronic cognitive impairment.  He has chronic problems including hypertension, atrial fibrillation, diastolic heart failure, hypothyroidism, peripheral neuropathy, essential tremor, hyperlipidemia.  Was admitted for worsening confusion and some increased leg swelling.  Was admitted on the 10th and discharged on the 14th.  MRI brain showed no stroke.  Head CT no acute abnormality.  Lab work unrevealing of any obvious cause for his acute confusion.  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.  Seem to get worse a few weeks ago.  He did report some fecal incontinence and lumbar MRI showed no acute spinal cord issues.  Felt this is more likely functional.  He had some rash on both lower extremities and several small lesions as he had some weeping edema recently.  There was some concern regarding vasculitic rash but his lab work was unremarkable.  Had repeat echo with evidence for right heart dysfunction.  Ejection fraction 60 to 65%.  Currently on Lasix 20 mg daily.  Edema stable and home weight stable.  May need adjustment as outpatient.  Maintained on Eliquis for history of atrial fibrillation.  Family looking at 2-week respite assisted living type stay.  Their eventual goal is to try to get him back home  Past Medical History:  Diagnosis Date   Allergic rhinitis due to pollen 06/07/2008   Atrial fibrillation ([place])    Benign essential HTN    COLONIC POLYPS, HX OF 10/28/2006   DIVERTICULOSIS, COLON 11/27/2006   Gait abnormality 03/02/2018   GEN OSTEOARTHROSIS INVOLVING MULTIPLE SITES 01/03/2010   HYPERGLYCEMIA 10/18/2008    HYPOTHYROIDISM 11/27/2006   NEOPLASM, MALIGNANT, PROSTATE, HX OF, S/P TURP 05/28/2007   OSTEOARTHRITIS 11/27/2006   Peripheral neuropathy 04/08/2018   Presence of permanent cardiac pacemaker 08/20/2018   WEIGHT LOSS, RECENT 11/27/2006   Past Surgical History:  Procedure Laterality Date   arthroscopy rt knee     EXCISION MASS NECK Left 01/29/2019   Procedure: 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 He has never used smokeless tobacco. He reports that he does not drink alcohol and does not use drugs. family history includes Cancer in his father; Colon cancer in his sister; Coronary artery disease in his mother; Heart disease in his mother; Pancreatic cancer in his father; Tremor in his sister. No Known Allergies  Review of Systems  Constitutional:  Positive for malaise/fatigue. Negative for chills and fever.  Eyes:  Negative for blurred vision.  Respiratory:  Negative for shortness of breath.   Cardiovascular:  Negative for chest pain.  Gastrointestinal:  Negative for abdominal pain.  Genitourinary:  Negative for dysuria.  Neurological:  Negative for dizziness and headaches.      Objective:     BP 130/86 (BP Location: Left Arm, Patient Position: Sitting, Cuff Size: Normal)   Pulse 60   Temp (!) 97.3 F (36.3 C) (Oral)   Ht '6\' 4"'$  (1.93 m)   Wt 186 lb 14.4 oz (84.8 kg)   SpO2 98%   BMI 22.75 kg/m  BP Readings from Last 3 Encounters:  10/09/21 130/86  10/05/21 136/89  09/18/21 116/70   Wt Readings from Last 3 Encounters:  10/09/21 186 lb  14.4 oz (84.8 kg)  10/04/21 187 lb 2.7 oz (84.9 kg)  09/18/21 186 lb 12.8 oz (84.7 kg)      Physical Exam Vitals reviewed.  Cardiovascular:     Rate and Rhythm: Normal rate.  Pulmonary:     Effort: Pulmonary effort is normal.     Breath sounds: Normal breath sounds. No wheezing or rales.  Abdominal:     Palpations: Abdomen is soft.  Musculoskeletal:     Comments: Trace nonpitting edema lower legs bilaterally      No results found for any visits on 10/09/21.    The ASCVD Risk score (Arnett DK, et al., 2019) failed to calculate for the following reasons:   The 2019 ASCVD risk score is only valid for ages 25 to 41    Assessment & Plan:   #1 recent acute encephalopathy.  Imaging and lab work unrevealing.  No evidence for recent CVA.  No new medications.  Background setting of some cognitive impairment.  He has pending evaluation with neuropsychologist in August.  They are encouraged to keep that follow-up.  We did briefly discuss medication such as Aricept and Namenda but would hold at this time until further evaluated.  -QuantiFERON gold plus TB assay ordered to assess with anticipated 2-week assisted living stay. -Forms completed for assisted living  #2 hypertension stable and at goal  #3 history of atrial fibrillation currently rate controlled.  Continue Eliquis.  #4 hypothyroidism.  Continue levothyroxine.   No follow-ups on file.    Carolann Littler, MD

## 2021-10-09 NOTE — Progress Notes (Signed)
Remote pacemaker transmission.   

## 2021-10-10 ENCOUNTER — Telehealth: Payer: Self-pay | Admitting: Family Medicine

## 2021-10-10 NOTE — Telephone Encounter (Signed)
Once results have been reviewed by PCP pt will be contacted

## 2021-10-10 NOTE — Telephone Encounter (Signed)
Requesting a call when TB results are available, pt is going to assisted living 10/11/21 and the results are needed asap.

## 2021-10-11 ENCOUNTER — Telehealth: Payer: Self-pay | Admitting: Family Medicine

## 2021-10-11 ENCOUNTER — Other Ambulatory Visit (HOSPITAL_COMMUNITY): Payer: Self-pay | Admitting: Diagnostic Radiology

## 2021-10-11 ENCOUNTER — Other Ambulatory Visit: Payer: Self-pay | Admitting: Diagnostic Radiology

## 2021-10-11 ENCOUNTER — Ambulatory Visit (HOSPITAL_COMMUNITY)
Admission: RE | Admit: 2021-10-11 | Discharge: 2021-10-11 | Disposition: A | Discharge status: Home / Self Care | Payer: Medicare HMO | Source: Ambulatory Visit | Attending: Diagnostic Radiology | Admitting: Diagnostic Radiology

## 2021-10-11 DIAGNOSIS — A150 Tuberculosis of lung: Secondary | ICD-10-CM | POA: Insufficient documentation

## 2021-10-11 DIAGNOSIS — I517 Cardiomegaly: Secondary | ICD-10-CM | POA: Diagnosis not present

## 2021-10-11 DIAGNOSIS — I700 Atherosclerosis of aorta: Secondary | ICD-10-CM | POA: Diagnosis not present

## 2021-10-11 DIAGNOSIS — Z111 Encounter for screening for respiratory tuberculosis: Secondary | ICD-10-CM | POA: Diagnosis not present

## 2021-10-11 NOTE — Telephone Encounter (Signed)
Daughter called & advised that the Quantiferon Gold test is a send out test & likely at least a 5 day turn around since it was sent to [facility].  Daughter expressing concerns that result was needed prior to Sat 7/22. Since test was collected on 10/09/21, it will likely not be back by then. Assured her that once the test results she will receive a phone call. Daughter verb understanding.

## 2021-10-11 NOTE — Telephone Encounter (Signed)
Pt's daughter Katharine Look) called to ask if MD can call her as soon as the results of Pt's lab work comes in, because she is trying to get her dad into an assisted living, and time is of the essence.  Please call her at: [phone]

## 2021-10-11 NOTE — Telephone Encounter (Signed)
Daughter notified of Dr Ledell Noss response:  That is not appropriate for TB screening. CXR only needed if quantiferon gold is positive. Not enough time to place a PPD either...looks like we are stuck awaiting the quantiferon gold results.  Daughter verb understanding & states the the facility would have taken the CXR, but she will wait & hope the test comes back sooner.

## 2021-10-11 NOTE — Telephone Encounter (Signed)
Daughter is requesting a CXR specifically to r/o TB since blood test results will not be back by Saturday. Daughter aware that PCP is out of office. Will have to send to other provider.

## 2021-10-12 LAB — QUANTIFERON-TB GOLD PLUS
Mitogen-NIL: 8.66 IU/mL
NIL: 0.06 IU/mL
QuantiFERON-TB Gold Plus: NEGATIVE
TB1-NIL: 0.04 IU/mL
TB2-NIL: 0.04 IU/mL

## 2021-10-16 ENCOUNTER — Other Ambulatory Visit: Payer: Self-pay | Admitting: Family Medicine

## 2021-10-18 ENCOUNTER — Telehealth: Payer: Self-pay | Admitting: Family Medicine

## 2021-10-18 NOTE — Telephone Encounter (Signed)
error 

## 2021-10-29 DIAGNOSIS — Z7901 Long term (current) use of anticoagulants: Secondary | ICD-10-CM | POA: Diagnosis not present

## 2021-10-29 DIAGNOSIS — I110 Hypertensive heart disease with heart failure: Secondary | ICD-10-CM | POA: Diagnosis not present

## 2021-10-29 DIAGNOSIS — E039 Hypothyroidism, unspecified: Secondary | ICD-10-CM | POA: Diagnosis not present

## 2021-10-29 DIAGNOSIS — R69 Illness, unspecified: Secondary | ICD-10-CM | POA: Diagnosis not present

## 2021-10-29 DIAGNOSIS — I4821 Permanent atrial fibrillation: Secondary | ICD-10-CM | POA: Diagnosis not present

## 2021-10-29 DIAGNOSIS — K219 Gastro-esophageal reflux disease without esophagitis: Secondary | ICD-10-CM | POA: Diagnosis not present

## 2021-10-29 DIAGNOSIS — I5033 Acute on chronic diastolic (congestive) heart failure: Secondary | ICD-10-CM | POA: Diagnosis not present

## 2021-10-29 DIAGNOSIS — Z950 Presence of cardiac pacemaker: Secondary | ICD-10-CM | POA: Diagnosis not present

## 2021-10-29 DIAGNOSIS — R001 Bradycardia, unspecified: Secondary | ICD-10-CM | POA: Diagnosis not present

## 2021-10-29 DIAGNOSIS — H409 Unspecified glaucoma: Secondary | ICD-10-CM | POA: Diagnosis not present

## 2021-10-31 ENCOUNTER — Ambulatory Visit: Payer: Medicare HMO | Admitting: Psychology

## 2021-10-31 ENCOUNTER — Encounter: Payer: Self-pay | Admitting: Psychology

## 2021-10-31 DIAGNOSIS — G309 Alzheimer's disease, unspecified: Secondary | ICD-10-CM

## 2021-10-31 DIAGNOSIS — F0280 Dementia in other diseases classified elsewhere without behavioral disturbance: Secondary | ICD-10-CM

## 2021-10-31 DIAGNOSIS — R4189 Other symptoms and signs involving cognitive functions and awareness: Secondary | ICD-10-CM

## 2021-10-31 DIAGNOSIS — G250 Essential tremor: Secondary | ICD-10-CM | POA: Diagnosis not present

## 2021-10-31 DIAGNOSIS — I6781 Acute cerebrovascular insufficiency: Secondary | ICD-10-CM | POA: Diagnosis not present

## 2021-10-31 HISTORY — DX: Dementia in other diseases classified elsewhere, unspecified severity, without behavioral disturbance, psychotic disturbance, mood disturbance, and anxiety: F02.80

## 2021-10-31 NOTE — Progress Notes (Unsigned)
NEUROPSYCHOLOGICAL EVALUATION [place]. [facility] Department of Neurology  Date of Evaluation: October 31, 2021  Reason for Referral:   LEOBARDO GRANLUND is a 88 y.o. right-handed Caucasian male referred by  the [facility] , to characterize his current cognitive functioning and assist with diagnostic clarity and treatment planning in the context of subjective cognitive decline.   Assessment and Plan:   Clinical Impression(s): Mr. Read pattern of performance is suggestive of fairly diffuse cognitive impairment. Visuospatial abilities represented a relative strength ranging from the below average to average normative ranges and he performed well on a verbal task requiring him to sequence numbers rapidly. However, generally severe impairment was exhibited across all other assessed domains. This included attention/concentration, cognitive flexibility, expressive language, and all aspects of learning and memory. Regarding ADLs, Mr. Mccalla family have taken over medication and financial responsibilities. He has not driven since a fall this past June and recently began utilizing home health aides. Given evidence for cognitive and functional impairment, he best meets diagnostic criteria for a Major Neurocognitive Disorder ("dementia") at the present time.  The etiology for ongoing decline is difficult to discern given diffuse impairment with minimal emerging patterns. Given population base rates, Alzheimer's disease represents the most common neurological illness which results in progressive decline and an eventual dementia designation. Across memory testing, Mr. Lazcano exhibited trouble learning novel information, was essentially amnestic after brief delays, and performed poorly across a list-based recognition task. This would suggest evidence for rapid forgetting and a prominent memory storage impairment, both of which are hallmark characteristics of this illness.  Impairments in confrontation naming and semantic fluency would also align with typical disease progression. Overall, this remains plausible and the most likely explanation for ongoing decline at the present time.  While Mr. Carcamo exhibits tremors, shuffling gait, and some balance concerns, tremors are not resting in nature and these have historically been attributed to essential tremor. Amnestic memory and confrontation naming impairment would be an unexpected finding if these symptoms were in fact better accounted for by Parkinson's disease. Given that visuospatial abilities were a relative strength, cognitive testing does not align with an underlying Lewy body dementia presentation. Common behavioral features are also absent. I also do not see compelling evidence for frontotemporal dementia or a more rare parkinsonian syndrome at the present time. Recent neuroimaging suggested mild to moderate microvascular disease. While there could certainly be a vascular contribution to ongoing impairment, I do not believe this would fully account for ongoing impairment. Continued medical monitoring will be important moving forward.   Recommendations: Mr. Steil could discuss medication options to address memory loss and concerns surrounding Alzheimer's disease with his medical team. I am unaware if he is followed by a neurologist within the [place] system. If he is, then I would discuss medication options with this individual. It is important to highlight that these medications have been shown to slow functional decline in some individuals. There is no current treatment which can stop or reverse cognitive decline when caused by a neurodegenerative illness.   If there are greater concerns for Parkinson's disease or other alternative explanations for movement symptoms beyond essential tremor, completion of a DaTscan would be beneficial.   Based upon current test scores, I agree with his family regarding driving restrictions  and would recommend that he fully abstain from driving pursuits.   It will be important for Mr. Peretti to have another person with him when in situations where he may need to  process information, weigh the pros and cons of different options, and make decisions, in order to ensure that he fully understands and recalls all information to be considered.  If not already done, Mr. Cina and his family may want to discuss his wishes regarding durable power of attorney and medical decision making, so that e can have input into these choices. Additionally, they may wish to discuss future plans for caretaking and seek out community options for in home/residential care should they become necessary.  Mr. Dowdy is encouraged to attend to lifestyle factors for brain health (e.g., regular physical exercise, good nutrition habits, regular participation in cognitively-stimulating activities, and general stress management techniques), which are likely to have benefits for both emotional adjustment and cognition. Optimal control of vascular risk factors (including safe cardiovascular exercise and adherence to dietary recommendations) is encouraged. Continued participation in activities which provide mental stimulation and social interaction is also recommended.  Important information should be provided to Mr. Donado in written format in all instances. This information should be placed in a highly frequented and easily visible location within his home to promote recall. External strategies such as written notes in a consistently used memory journal, visual and nonverbal auditory cues such as a calendar on the refrigerator or appointments with alarm, such as on a cell phone, can also help maximize recall.  Review of Records:   Mr. Isenberg was seen by [facility] Butler Denmark, NP) on 04/24/2021 for ongoing follow-up of essential tremor. He has trialed several medications (gabapentin, Klonopin, cannot try  Primidone due to Eliquis). He was prescribed Xanax 0.5 mg twice daily and Topamax 25 mg twice daily at that time and was tolerating this well. Tremors were said to have good and bad days with one hand not being worse than the other. He lives alone and denied any recent falls. Balance decline was reported. He phoned his neurologist on 05/22/2021 and discussed him tapering off Xanax.   Records provided by the [facility] suggest concerns surrounding short-term memory loss. However, they are void of any additional, more specific details. It is believed that Mr. Nicklaus was referred for a comprehensive neuropsychological evaluation to characterize his cognitive abilities and to assist with diagnostic clarity and treatment planning.   Brain MRI on 10/03/2021 revealed moderate parenchymal volume loss and mild to moderate microvascular ischemic disease.   Past Medical History:  Diagnosis Date   Acute encephalopathy 10/01/2021   Acute on chronic diastolic CHF (congestive heart failure)    Allergic rhinitis due to pollen 06/07/2008   Anticoagulated by anticoagulation treatment 01/08/2019   Chronic maxillary sinusitis 08/24/2008   Cortical senile cataract 05/03/2020   Dermatophytosis of nail 06/06/2009   Diverticulosis of colon 11/27/2006   Essential hypertension 10/28/2006   Essential tremor 06/25/2016   Gait abnormality 03/02/2018   Generalized osteoarthrosis, involving multiple sites 01/03/2010   GERD (gastroesophageal reflux disease) 02/08/2015   History of colonic polyps 10/28/2006   Hoarseness, chronic 10/18/2008   Hydrocele    S/P repair 08/2950 complicated by seroma   Hyperglycemia 10/18/2008   Hyperlipidemia 10/28/2006   Hypothyroidism 11/27/2006   Leg swelling 10/02/2021   Melanoma of left side of neck 01/29/2019   Nocturia 02/08/2015   Open-angle glaucoma 05/03/2020   Peripheral neuropathy 04/08/2018   Permanent atrial fibrillation 08/20/2018   Personal history of malignant neoplasm of prostate  05/28/2007   Presence of permanent cardiac pacemaker 08/20/2018   Senile nuclear sclerosis 05/03/2020    Past Surgical History:  Procedure Laterality Date  arthroscopy rt knee     EXCISION MASS NECK Left 01/29/2019   Procedure: 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;  Surgeon: [name], MD;  Location: [facility];  Service: ENT;  Laterality: Left;   HYDROCELE EXCISION / REPAIR     INSERT / REPLACE / REMOVE PACEMAKER  08/20/2018   PACEMAKER IMPLANT N/A 08/20/2018   Procedure: PACEMAKER IMPLANT;  Surgeon: Constance Haw, MD;  Location: [facility];  Service: Cardiovascular;  Laterality: N/A;   PROSTATE SURGERY     TURP   ROTATOR CUFF REPAIR      Current Outpatient Medications:    acetaminophen (TYLENOL) 650 MG CR tablet, Take 650 mg by mouth 2 (two) times a day., Disp: , Rfl:    apixaban (ELIQUIS) 5 MG TABS tablet, Take 1 tablet (5 mg total) by mouth 2 (two) times daily., Disp: 180 tablet, Rfl: 3   atorvastatin (LIPITOR) 20 MG tablet, TAKE 1 TABLET DAILY (Patient taking differently: Take 20 mg by mouth every evening.), Disp: 90 tablet, Rfl: 1   Cholecalciferol (VITAMIN D3) 50 MCG (2000 UT) TABS, Take 2,000 Units by mouth daily., Disp: , Rfl:    finasteride (PROSCAR) 5 MG tablet, TAKE 1 TABLET DAILY (Patient taking differently: Take 5 mg by mouth daily.), Disp: 90 tablet, Rfl: 1   furosemide (LASIX) 20 MG tablet, Take 1 tablet (20 mg total) by mouth daily., Disp: 30 tablet, Rfl: 1   levothyroxine (SYNTHROID) 75 MCG tablet, TAKE 1 TABLET DAILY (Patient taking differently: Take 75 mcg by mouth daily before breakfast.), Disp: 90 tablet, Rfl: 1   lisinopril (ZESTRIL) 20 MG tablet, TAKE 1 TABLET DAILY (Patient taking differently: Take 20 mg by mouth daily.), Disp: 90 tablet, Rfl: 3   omeprazole (PRILOSEC) 20 MG capsule, Take 1 capsule (20 mg total) by mouth daily., Disp: 90 capsule, Rfl: 3   pyridOXINE (VITAMIN B-6) 100 MG tablet, Take 100 mg by mouth daily., Disp: ,  Rfl:    timolol (TIMOPTIC) 0.5 % ophthalmic solution, Place 1 drop into the left eye 2 (two) times daily., Disp: , Rfl:    topiramate (TOPAMAX) 25 MG tablet, TAKE 1 TABLET BY MOUTH TWICE A DAY, Disp: 180 tablet, Rfl: 0   triamcinolone cream (KENALOG) 0.1 %, Apply 1 Application topically 2 (two) times daily. (Patient taking differently: Apply 1 Application topically 2 (two) times daily. Apply to sores on both legs), Disp: 45 g, Rfl: 1  Clinical Interview:   The following information was obtained during a clinical interview with Mr. Henson and his daughter prior to cognitive testing.  Cognitive Symptoms: Decreased short-term memory: Endorsed. His primary example surrounded trouble retaining new information (e.g., recently held conversations). He noted potential decline ongoing for the past 12 months or so. His daughter was generally in agreement. She also reported more generalized short-term memory concerns. While she broadly agreed with his overall timeline, she noted that things seem to have acutely worsened during the past several months.  Decreased long-term memory: Denied. Decreased attention/concentration: Denied. His daughter did report increased difficulty with sustained attention and increased distractibility.  Reduced processing speed: Endorsed "maybe." Difficulties with executive functions: Endorsed "maybe." This seemed to surrounding potential trouble with decision making and organization. His daughter denied any trouble with impulsivity or any significant personality changes.  Difficulties with emotion regulation: Denied. Difficulties with receptive language: Denied. Difficulties with word finding: Endorsed "maybe." His daughter also reported ongoing word finding concerns.  Decreased visuoperceptual ability: Denied.  Difficulties completing ADLs:  Endorsed. Per his daughter, she and other family members have taken over medication, financial, and bill paying responsibilities during the  past several months. He sustained a fall this past June and has not driven since. Prior to this, his daughter reported prior instances of him getting lost (e.g., getting lost while driving to go to various doctor appointments). He continues to live alone. However, his daughter noted that he started working with an in-home caregiver during the previous week.   Additional Medical History: History of traumatic brain injury/concussion: Denied. History of stroke: Denied. History of seizure activity: Denied. History of known exposure to toxins: Denied. Symptoms of chronic pain: Denied. Experience of frequent headaches/migraines: Denied. Frequent instances of dizziness/vertigo: Denied.  Sensory changes: He wears readers with benefit. Other sensory changes/difficulties (e.g., hearing, taste, smell) were denied.  Balance/coordination difficulties: Endorsed. Overall stability has gradually declined over time. Per his daughter, he started ambulating with the assistance of a walker during the past 1-2 months. While he denied any recent falls, his daughter noted that he fell this past June. The cause for this was unknown as it was not witnessed by others. One side of the body was not said to exhibit greater instability relative to the other.  Other motor difficulties: Endorsed. He has a history of essential tremor with symptoms affecting his upper extremities bilaterally. One side was not said to be worse than the other. Medication was said to be somewhat helpful.   Sleep History: Estimated hours obtained each night: 7-8 hours. Difficulties falling asleep: Denied. Difficulties staying asleep: Denied. Feels rested and refreshed upon awakening: Endorsed.  History of snoring: Denied. History of waking up gasping for air: Denied. Witnessed breath cessation while asleep: Denied.  History of vivid dreaming: Denied. Excessive movement while asleep: Denied. Instances of acting out his dreams:  Denied.  Psychiatric/Behavioral Health History: Depression: He described his current mood as "good" and denied to his knowledge any prior mental health concerns or diagnoses. Current or remote suicidal ideation, intent, or plan was denied.  Anxiety: Denied. Mania: Denied. Trauma History: Denied. Visual/auditory hallucinations: Denied. Delusional thoughts: Denied.  Tobacco: Denied. Alcohol: He denied current alcohol consumption as well as a history of problematic alcohol abuse or dependence.  Recreational drugs: Denied.  Family History: Problem Relation Age of Onset   Coronary artery disease Mother    Heart disease Mother    Pancreatic cancer Father    Cancer Father    Tremor Sister    Colon cancer Sister        2021   Esophageal cancer Neg Hx    Stomach cancer Neg Hx    Rectal cancer Neg Hx    This information was confirmed by Mr. Lapoint.  Academic/Vocational History: Highest level of educational attainment: 16 years. He graduated from high school and earned a Dietitian in business administration. He described herself as a good (A/B) student in academic settings. No relative weaknesses were identified.  History of developmental delay: Denied. History of grade repetition: Denied. Enrollment in special education courses: Denied. History of LD/ADHD: Denied.  Employment: Retired. He spent two years in the [employer]. He also worked 35-40 years for [employer].   Evaluation Results:   Behavioral Observations: Mr. Vandevelde was accompanied by his daughter, arrived to his appointment on time, and was appropriately dressed and groomed. He appeared alert. He exhibited a hunched forward posture and ambulated with the assistance of a walker. He maneuvered this device well and with appropriate speed despite evidence for  mild shuffling. Gross motor functioning appeared intact upon informal observation and no abnormal movements (e.g., tremors) were noted during interview.  However, he did keep his hands in his lap throughout the interview. His affect was generally relaxed and positive, but did range appropriately given the subject being discussed during the clinical interview or the task at hand during testing procedures. Spontaneous speech was fluent and word finding difficulties were not observed during the clinical interview. Thought processes were coherent, organized, and normal in content. Insight into his cognitive difficulties appeared somewhat adequate. However, he did generally qualify most reporting of subjective cognitive dysfunction with the phrase "maybe."   During testing, he sat hunched forward in his chair, often looking towards the ground. Eye contact was minimal and his gaze was moved upward only when it was required of him to review certain stimuli. He was very slow to respond to questions and sometimes required the psychometrist to provide additional prompts asking him to provide a response. Sustained attention was appropriate. Task engagement was adequate and he persisted when challenged. He fatigued quickly and the overall evaluation was abbreviated in response. Overall, Mr. Roessner was cooperative with the clinical interview and subsequent testing procedures.   Adequacy of Effort: The validity of neuropsychological testing is limited by the extent to which the individual being tested may be assumed to have exerted adequate effort during testing. Mr. Warshaw expressed his intention to perform to the best of his abilities and exhibited adequate task engagement and persistence. Scores across stand-alone and embedded performance validity measures were variable. However, his below expectation performance is likely due to true cognitive impairment rather than poor engagement or attempts to perform poorly. As such, the results of the current evaluation are believed to be a largely valid representation of Mr. Sulton current cognitive functioning.  Test Results: Mr.  Garate was mildly disoriented at the time of the current evaluation. He could not state the full year when asked ("23") and was unable to state the current date, day of the week, or name of the current clinic. When asked why he was here, he responded "some testing I think."  Intellectual abilities based upon educational and vocational attainment were estimated to be in the average range. Premorbid abilities were estimated to be within the below average range based upon a single-word reading test.   Processing speed was average across an oral task requiring him to count from 1-25 as quickly as possible. Other processing speed tasks were unable to be completed due to a combination of ongoing tremor and fatigue. Basic attention was well below average. More complex attention (e.g., working memory) was unable to be assessed due to fatigue factors. Cognitive flexibility across an oral task requiring number and letter sequencing was exceptionally low. Other aspects of executive functioning were unable to be assessed.   While receptive language abilities were not directly assessed, abilities were believed to be adequate. Mr. Toral did not exhibit prominent difficulties comprehending task instructions and answered all questions asked of him during interview appropriately. Assessed expressive language (e.g., verbal fluency and confrontation naming) was exceptionally low to well below average.     Assessed visuospatial/visuoconstructional abilities were below average to average. On his drawing of a clock, he placed the numbers 4 and 5 twice, with the second positioning being where 7 and 8 should be placed. Numbers were also placed outside of the clock face and there was minimal differentiation in clock hand size. Points were lost on his copy of a complex figure  due to relatively mild visual distortions and one internal aspect being omitted entirely.     Learning (i.e., encoding) of novel verbal information was  exceptionally low to well below average. Spontaneous delayed recall (i.e., retrieval) of previously learned information was also exceptionally low to well below average. Retention rates were 50% (raw score of 3) across a story learning task, 0% across a list learning task, and 0% across a figure drawing task. Performance across a list-based recognition tasks was exceptionally low, suggesting limited evidence for information consolidation.   Results of emotional screening instruments suggested that recent symptoms of generalized anxiety were in the minimal range, while symptoms of depression were within normal limits. A screening instrument assessing recent sleep quality suggested the presence of minimal sleep dysfunction.  Tables of Scores:   Note: This summary of test scores accompanies the interpretive report and should not be considered in isolation without reference to the appropriate sections in the text. Descriptors are based on appropriate normative data and may be adjusted based on clinical judgment. Terms such as "Within Normal Limits" and "Outside Normal Limits" are used when a more specific description of the test score cannot be determined.       Percentile - Normative Descriptor > 98 - Exceptionally High 91-97 - Well Above Average 75-90 - Above Average 25-74 - Average 9-24 - Below Average 2-8 - Well Below Average < 2 - Exceptionally Low       Validity:   DESCRIPTOR       Dot Counting Test: --- --- Within Normal Limits  RBANS Effort Index: --- --- Outside Normal Limits       Orientation:      Raw Score Percentile   NAB Orientation, Form 1 22/29 --- ---       Cognitive Screening:      Raw Score Percentile   SLUMS: 12/30 --- ---       RBANS, Form A: Standard Score/ Scaled Score Percentile   Total Score --- --- ---  Immediate Memory 61 <1 Exceptionally Low    List Learning 2 <1 Exceptionally Low    Story Memory 5 5 Well Below Average  Visuospatial/Constructional 78 7 Well  Below Average    Figure Copy 6 9 Below Average    Line Orientation 14/20 17-25 Below Average to Average  Language 57 <1 Exceptionally Low    Picture Naming 8/10 3-9 Well Below Average    Semantic Fluency 1 <1 Exceptionally Low  Attention --- --- ---    Digit Span 5 5 Well Below Average    Coding Not attempted  (tremor) --- ---  Delayed Memory 48 <1 Exceptionally Low    List Recall 0/10 <2 Exceptionally Low    List Recognition 13/20 <2 Exceptionally Low    Story Recall 5 5 Well Below Average    Figure Recall 1 <1 Exceptionally Low        Intellectual Functioning:      Standard Score Percentile   Test of Premorbid Functioning: 88 21 Below Average       Attention/Executive Function:     Oral Trail Making Test (OTMT): Raw Score (Z-Score) Percentile     Part A 8 secs.,  0 errors (-0.33) 37 Average    Part B 83 secs.,  2 errors (-2.43) 1 Exceptionally Low       Language:     Verbal Fluency Test: Raw Score (Scaled Score) Percentile     Phonemic Fluency (CFL) 6 (4) 2 Well Below Average  Category Fluency 10 (3) 1 Exceptionally Low  *Based on [employer] Older Normative Studies (MOANS)          NAB Language Module, Form 1: T Score Percentile     Naming 19/31 (19) <1 Exceptionally Low       Visuospatial/Visuoconstruction:      Raw Score Percentile   Clock Drawing: 7/10 --- Within Normal Limits        Scaled Score Percentile   WAIS-IV Visual Puzzles: 8 25 Average       Mood and Personality:      Raw Score Percentile   PROMIS Depression Questionnaire: 8 --- None to Slight  PROMIS Anxiety Questionnaire: 7 --- None to Slight       Additional Questionnaires:      Raw Score Percentile   PROMIS Sleep Disturbance Questionnaire: 8 --- None to Slight   Informed Consent and Coding/Compliance:   The current evaluation represents a clinical evaluation for the purposes previously outlined by the referral source and is in no way reflective of a forensic evaluation.   Mr. Eisen was  provided with a verbal description of the nature and purpose of the present neuropsychological evaluation. Also reviewed were the foreseeable risks and/or discomforts and benefits of the procedure, limits of confidentiality, and mandatory reporting requirements of this provider. The patient was given the opportunity to ask questions and receive answers about the evaluation. Oral consent to participate was provided by the patient.   This evaluation was conducted by Christia Reading, Ph.D., ABPP-CN, board certified clinical neuropsychologist. Mr. Scarpelli completed a clinical interview with Dr. Melvyn Novas, billed as one unit [phone], and 100 minutes of cognitive testing and scoring, billed as one unit [phone] and two additional units [id number]. Psychometrist Milana Kidney, B.S., assisted Dr. Melvyn Novas with test administration and scoring procedures. As a separate and discrete service, Dr. Melvyn Novas spent a total of 160 minutes in interpretation and report writing billed as one unit [phone] and two units [id number].

## 2021-10-31 NOTE — Progress Notes (Signed)
   Psychometrician Note   Cognitive testing was administered to Daniel Everett by Milana Kidney, B.S. (psychometrist) under the supervision of Dr. Christia Reading, Ph.D., licensed psychologist on 10/31/2021. Daniel Everett did not appear overtly distressed by the testing session per behavioral observation or responses across self-report questionnaires. Rest breaks were offered.    The battery of tests administered was selected by Dr. Christia Reading, Ph.D. with consideration to Daniel Everett current level of functioning, the nature of his symptoms, emotional and behavioral responses during interview, level of literacy, observed level of motivation/effort, and the nature of the referral question. This battery was communicated to the psychometrist. Communication between Dr. Christia Reading, Ph.D. and the psychometrist was ongoing throughout the evaluation and Dr. Christia Reading, Ph.D. was immediately accessible at all times. Dr. Christia Reading, Ph.D. provided supervision to the psychometrist on the date of this service to the extent necessary to assure the quality of all services provided.    Daniel Everett will return within approximately 1-2 weeks for an interactive feedback session with Dr. Melvyn Novas at which time his test performances, clinical impressions, and treatment recommendations will be reviewed in detail. Daniel Everett understands he can contact our office should he require our assistance before this time.  A total of 100 minutes of billable time were spent face-to-face with Daniel Everett by the psychometrist. This includes both test administration and scoring time. Billing for these services is reflected in the clinical report generated by Dr. Christia Reading, Ph.D.  This note reflects time spent with the psychometrician and does not include test scores or any clinical interpretations made by Dr. Melvyn Novas. The full report will follow in a separate note.

## 2021-11-01 ENCOUNTER — Ambulatory Visit: Payer: Medicare HMO | Admitting: Neurology

## 2021-11-01 ENCOUNTER — Encounter: Payer: Self-pay | Admitting: Psychology

## 2021-11-01 ENCOUNTER — Encounter: Payer: Self-pay | Admitting: Neurology

## 2021-11-01 VITALS — BP 122/74 | HR 64 | Ht 76.0 in | Wt 182.0 lb

## 2021-11-01 DIAGNOSIS — G250 Essential tremor: Secondary | ICD-10-CM

## 2021-11-01 DIAGNOSIS — Z9289 Personal history of other medical treatment: Secondary | ICD-10-CM

## 2021-11-01 MED ORDER — TOPIRAMATE 25 MG PO TABS
25.0000 mg | ORAL_TABLET | Freq: Every day | ORAL | 0 refills | Status: DC
Start: 2021-11-01 — End: 2022-02-27

## 2021-11-01 NOTE — Patient Instructions (Signed)
It was nice to meet you both today.  I would like for you to reduce your topiramate to 25 mg once daily in the evening only.  If you do not have any significant flareup in your tremor, we may even take you completely off of it.  Please follow-up to see Daniel Denmark, NP in about 3 to 4 months.  Use your walker at all times.

## 2021-11-01 NOTE — Progress Notes (Signed)
Subjective:    Patient ID: Daniel Everett is a 88 y.o. male.  HPI    Interim history:   Daniel Everett is an 88 year old right-handed gentleman with an underlying complex medical history of chronic diastolic congestive heart failure, allergic rhinitis, hypertension, reflux disease, hyperlipidemia, hypothyroidism, history of melanoma, nocturia, history of peripheral neuropathy, atrial fibrillation, status post pacemaker placement, diverticulosis, and essential tremor, who presents for follow-up consultation of his longstanding history of tremor.  The patient is accompanied by his daughter Daniel Everett today.  He was previously followed by Dr. Margette Fast and was last seen by Dr. Jannifer Franklin on 10/12/2020.  I reviewed the office note and copied the note below for reference.  He was last seen in this clinic by Butler Denmark, NP on 04/24/2021, at which time he was on Topamax 25 mg twice daily as well as alprazolam 0.5 mg twice daily.  He was not a candidate for primidone due to being on Eliquis and a beta-blocker was not considered due to multiple blood pressure medications already.  Previously had tried gabapentin and Lyrica as well as clonazepam.  In the interim, he was advised to taper off alprazolam.  Today, 11/01/2021: He reports feeling better.  Per daughter, they have caregivers daily for about 12 hours.  He lives alone.  He fell in June.  He has no obvious family history of tremors.  He does not drink as much water as he showed's per his perception, unclear how much water he drinks.  He does not drink any caffeine or alcohol.  He has been off of alprazolam without repercussions, is not sure if the Topamax is helping.  He has been on and for about 3 years from what I can see in his records.  Of note, he was recently hospitalized for confusion and leg swelling.  He was admitted on 10/01/2021 and discharged on 10/05/2021.  I reviewed the hospital records.  Vitamin B12 was on the lower end of the spectrum at  281, TSH normal at 3.7, RPR negative.  He had a fall at home.  MRI lumbar spine with and without contrast on 10/02/2021 showed: IMPRESSION: 1. No acute abnormality within the lumbar spine. No evidence for cord compression. 2. Moderate multilevel degenerative spondylosis and facet arthrosis as detailed above, most pronounced at L3-4 where there is resultant moderate to severe spinal stenosis. 3. Mild-to-moderate multilevel foraminal narrowing as above, moderate in nature on the left at L4 and bilaterally at L5.  Brain MRI without contrast on 10/03/2021 showed: IMPRESSION: No acute infarction, hemorrhage, or mass.   Parenchymal volume loss and chronic microvascular ischemic changes.   Head CT without contrast from 10/01/2021 showed: IMPRESSION: 1. No evidence of acute intracranial abnormality. 2. Mild chronic small vessel ischemic changes within the cerebral white matter. 3. Mild-to-moderate cerebral atrophy. 4. Mild cerebellar atrophy. 5. Paranasal sinus disease at the imaged levels, as described. 6. Nonspecific 2.9 x 1.0 cm cutaneous/subcutaneous occipital scalp lesion. Direct visualization recommended.   Discharge diagnoses were acute encephalopathy, with active problems of hypothyroidism, permanent atrial fibrillation, leg swelling, acute on chronic diastolic congestive heart failure.  The patient's allergies, current medications, family history, past medical history, past social history, past surgical history and problem list were reviewed and updated as appropriate.  Previously:   04/24/21 Butler Denmark, NP): <<Daniel Everett here today for follow-up with history of essential tremor.  Has tried multiple medications (gabapentin, Klonopin, cannot try Primidone due to Eliquis). Currently on xanax 0.5 mg twice daily, Topamax 25  mg twice daily. Is tolerating medications. Some days tremor is worse than others, equal right and left handed. He doesn't know where he would be without the medications.   Lives alone.  No falls.  He does note his balance is not what it used to be. Today is a good day for tremor.   >>  10/12/20 (Dr. Jannifer Franklin): <<Daniel Everett is an 88 year old right-handed white male with a history of essential tremor.  The patient has had gradual worsening of the tremors as he has gotten older.  The patient did not feel that the clonazepam was helpful, he was switched to alprazolam.  He cannot take Mysoline as he is on Eliquis.  The patient gained some benefit with the alprazolam, but he still has a lot of problems with handwriting and with buttoning buttons.  He does not have any drowsiness on the medication.  He denies any dizziness.  He does report some mild gait instability, he has not had any falls.  He recently did have the COVID virus without hardly any symptoms, he has been fully vaccinated.  He comes to this office for further evaluation. >>   His Past Medical History Is Significant For: Past Medical History:  Diagnosis Date   Acute encephalopathy 10/01/2021   Acute on chronic diastolic CHF (congestive heart failure)    Allergic rhinitis due to pollen 06/07/2008   Anticoagulated by anticoagulation treatment 01/08/2019   Chronic maxillary sinusitis 08/24/2008   Cortical senile cataract 05/03/2020   Dementia due to late onset Alzheimer's disease 10/31/2021   Dermatophytosis of nail 06/06/2009   Diverticulosis of colon 11/27/2006   Essential hypertension 10/28/2006   Essential tremor 06/25/2016   Gait abnormality 03/02/2018   Generalized osteoarthrosis, involving multiple sites 01/03/2010   GERD (gastroesophageal reflux disease) 02/08/2015   History of colonic polyps 10/28/2006   Hoarseness, chronic 10/18/2008   Hydrocele    S/P repair 04/9526 complicated by seroma   Hyperglycemia 10/18/2008   Hyperlipidemia 10/28/2006   Hypothyroidism 11/27/2006   Leg swelling 10/02/2021   Melanoma of left side of neck 01/29/2019   Nocturia 02/08/2015   Open-angle glaucoma 05/03/2020    Peripheral neuropathy 04/08/2018   Permanent atrial fibrillation 08/20/2018   Personal history of malignant neoplasm of prostate 05/28/2007   Presence of permanent cardiac pacemaker 08/20/2018   Senile nuclear sclerosis 05/03/2020    His Past Surgical History Is Significant For: Past Surgical History:  Procedure Laterality Date   arthroscopy rt knee     EXCISION MASS NECK Left 01/29/2019   Procedure: 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;  Surgeon: [name], MD;  Location: [facility];  Service: ENT;  Laterality: Left;   HYDROCELE EXCISION / REPAIR     INSERT / REPLACE / REMOVE PACEMAKER  08/20/2018   PACEMAKER IMPLANT N/A 08/20/2018   Procedure: PACEMAKER IMPLANT;  Surgeon: Constance Haw, MD;  Location: [facility];  Service: Cardiovascular;  Laterality: N/A;   PROSTATE SURGERY     TURP   ROTATOR CUFF REPAIR      His Family History Is Significant For: Family History  Problem Relation Age of Onset   Coronary artery disease Mother    Heart disease Mother    Pancreatic cancer Father    Cancer Father    Tremor Sister    Colon cancer Sister        2021   Esophageal cancer Neg Hx    Stomach cancer Neg Hx    Rectal cancer Neg  Hx     His Social History Is Significant For: Social History   Socioeconomic History   Marital status: Widowed    Spouse name: Not on file   Number of children: 3   Years of education: 16   Highest education level: Bachelor's degree (e.g., BA, AB, BS)  Occupational History   Occupation: retired  Tobacco Use   Smoking status: Never   Smokeless tobacco: Never  Vaping Use   Vaping Use: Never used  Substance and Sexual Activity   Alcohol use: No   Drug use: No   Sexual activity: Not Currently  Other Topics Concern   Not on file  Social History Narrative   Lives alone   Caffeine use: very little   Right handed       3 children    2 in [place] and one in near [facility] graduate and played  basketball there   Graduated in 59    Social Determinants of Health   Financial Resource Strain: Low Risk  (03/28/2021)   Overall Financial Resource Strain (CARDIA)    Difficulty of Paying Living Expenses: Not hard at all  Food Insecurity: No Food Insecurity (03/28/2021)   Hunger Vital Sign    Worried About Running Out of Food in the Last Year: Never true    Ran Out of Food in the Last Year: Never true  Transportation Needs: No Transportation Needs (03/28/2021)   PRAPARE - Hydrologist (Medical): No    Lack of Transportation (Non-Medical): No  Physical Activity: Inactive (03/28/2021)   Exercise Vital Sign    Days of Exercise per Week: 0 days    Minutes of Exercise per Session: 0 min  Stress: No Stress Concern Present (03/28/2021)   [place]    Feeling of Stress : Not at all  Social Connections: Moderately Integrated (03/28/2021)   Social Connection and Isolation Panel [NHANES]    Frequency of Communication with Friends and Family: More than three times a week    Frequency of Social Gatherings with Friends and Family: More than three times a week    Attends Religious Services: More than 4 times per year    Active Member of [employer] or Organizations: Yes    Attends Archivist Meetings: More than 4 times per year    Marital Status: Widowed    His Allergies Are:  No Known Allergies:   His Current Medications Are:  Outpatient Encounter Medications as of 11/01/2021  Medication Sig   acetaminophen (TYLENOL) 650 MG CR tablet Take 650 mg by mouth 2 (two) times a day.   apixaban (ELIQUIS) 5 MG TABS tablet Take 1 tablet (5 mg total) by mouth 2 (two) times daily.   atorvastatin (LIPITOR) 20 MG tablet TAKE 1 TABLET DAILY (Patient taking differently: Take 20 mg by mouth every evening.)   Cholecalciferol (VITAMIN D3) 50 MCG (2000 UT) TABS Take 2,000 Units by mouth daily.   finasteride (PROSCAR) 5 MG  tablet TAKE 1 TABLET DAILY (Patient taking differently: Take 5 mg by mouth daily.)   furosemide (LASIX) 20 MG tablet Take 1 tablet (20 mg total) by mouth daily.   levothyroxine (SYNTHROID) 75 MCG tablet TAKE 1 TABLET DAILY (Patient taking differently: Take 75 mcg by mouth daily before breakfast.)   lisinopril (ZESTRIL) 20 MG tablet TAKE 1 TABLET DAILY (Patient taking differently: Take 20 mg by mouth daily.)   omeprazole (PRILOSEC) 20 MG  capsule Take 1 capsule (20 mg total) by mouth daily.   pyridOXINE (VITAMIN B-6) 100 MG tablet Take 100 mg by mouth daily.   timolol (TIMOPTIC) 0.5 % ophthalmic solution Place 1 drop into the left eye 2 (two) times daily.   topiramate (TOPAMAX) 25 MG tablet TAKE 1 TABLET BY MOUTH TWICE A DAY   triamcinolone cream (KENALOG) 0.1 % Apply 1 Application topically 2 (two) times daily. (Patient taking differently: Apply 1 Application topically 2 (two) times daily. Apply to sores on both legs)   No facility-administered encounter medications on file as of 11/01/2021.  :  Review of Systems:  Out of a complete 14 point review of systems, all are reviewed and negative with the exception of these symptoms as listed below:  Review of Systems  Neurological:        Pt here for Tremors f/u  Pt states his tremors are same since last visit Pt states tremors are in both hands  Pt states was admitted  to hospital for swollen legs     Objective:  Neurological Exam  Physical Exam Physical Examination:   Vitals:   11/01/21 1411  BP: 122/74  Pulse: 64    General Examination: The patient is a very pleasant 88 y.o. male in no acute distress.  He appears frail and deconditioned, well-groomed.   HEENT: Normocephalic, atraumatic, pupils are equal, round and reactive to light, extraocular tracking is mildly impaired, forward flexion of neck noted, no nuchal rigidity.  Airway examination reveals mild mouth dryness, no significant head tremor, no lip or neck tremor.  No jaw tremor.   No voice tremor.  Voice is slightly raspy and speech is scant.    Chest: Clear to auscultation without wheezing, rhonchi or crackles noted.  Heart: S1+S2+0, regular and normal without murmurs, rubs or gallops noted.   Abdomen: Soft, non-tender and non-distended with normal bowel sounds appreciated on auscultation.  Extremities: There is trace edema right lower extremity, 1+ edema left lower extremity distally.   Skin: Warm and dry without trophic changes noted.   Musculoskeletal: exam reveals no obvious joint deformities, upper body flexion noted.    Neurologically:   Mental status: The patient is awake, paying attention but unable to provide details of his history.   Cranial nerves II - XII are as described above under HEENT exam.  Motor exam: Thin bulk, global strength of about 4 out of 5.  He has a mild resting tremor which is intermittent in the right upper extremity but otherwise no resting tremor.  He has a minimal postural tremor in both upper extremities, mild action tremor, no lower extremity tremors.   Romberg is not tested for safety concerns.  Fine motor skills are mildly impaired, no lateralization.  No significant decrement in amplitude with finger taps and foot taps.   Cerebellar testing: No dysmetria or intention tremor. There is no truncal or gait ataxia.  Sensory exam: intact to light touch in the upper and lower extremities.  Gait, station and balance: He stands up slowly, he walks with a 2 wheeled walker.  Posture is moderately stooped in the upper body.   Assessment and Plan:   In summary, Daniel Everett is a very pleasant 87 y.o.-year old male with an underlying complex medical history of chronic diastolic congestive heart failure, allergic rhinitis, hypertension, reflux disease, hyperlipidemia, hypothyroidism, history of melanoma, nocturia, history of peripheral neuropathy, atrial fibrillation, status post pacemaker placement, diverticulosis, and essential tremor, who  presents for follow-up consultation of  his longstanding history of tremor.history and examination are in keeping with essential tremor.  No evidence of parkinsonism.  He has trialed several medications in the past, has recently been tapered off alprazolam.  He is on low-dose Topamax generic 25 mg twice daily.  Not sure that it is helping, it does cause certain side effects including sleepiness and cognitive clouding in some patients.  We mutually agreed to reduce the dose and see if we can taper him off at this time of Topamax.  I would not recommend any other trials of symptomatic tremor medications for fear of side effects or interactions.  He is not a candidate for primidone because he is on Eliquis.  He already has a lower heart rate at 64 and I would not add a beta-blocker at this time.  He is advised to take topiramate 25 mg in the evening only at this point and follow-up to see Butler Denmark, nurse practitioner in about 3 to 4 months to see if we can actually taper him completely off of Topamax at the time.  He is encouraged to stay well-hydrated with water and use his walker at all times.  I answered all their questions today and the patient and his daughter were in agreement.  I spent 40 minutes in total face-to-face time and in reviewing records during pre-charting, more than 50% of which was spent in counseling and coordination of care, reviewing test results, reviewing medications and treatment regimen and/or in discussing or reviewing the diagnosis of ET, the prognosis and treatment options. Pertinent laboratory and imaging test results that were available during this visit with the patient were reviewed by me and considered in my medical decision making (see chart for details).

## 2021-11-07 ENCOUNTER — Ambulatory Visit: Payer: Medicare HMO | Admitting: Psychology

## 2021-11-07 DIAGNOSIS — F0280 Dementia in other diseases classified elsewhere without behavioral disturbance: Secondary | ICD-10-CM | POA: Diagnosis not present

## 2021-11-07 DIAGNOSIS — G309 Alzheimer's disease, unspecified: Secondary | ICD-10-CM | POA: Diagnosis not present

## 2021-11-07 NOTE — Progress Notes (Signed)
   Neuropsychology Feedback Session Daniel Everett. [facility] of Neurology  Reason for Referral:   Daniel Everett is a 87 y.o. right-handed Caucasian male referred by  the [facility] , to characterize his current cognitive functioning and assist with diagnostic clarity and treatment planning in the context of subjective cognitive decline.   Feedback:   Daniel Everett completed a comprehensive neuropsychological evaluation on 10/31/2021. Please refer to that encounter for the full report and recommendations. Briefly, results suggested fairly diffuse cognitive impairment. Visuospatial abilities represented a relative strength ranging from the below average to average normative ranges and he performed well on a verbal task requiring him to sequence numbers rapidly. However, generally severe impairment was exhibited across all other assessed domains. The etiology for ongoing decline is difficult to discern given diffuse impairment with minimal emerging patterns. Given population base rates, Alzheimer's disease represents the most common neurological illness which results in progressive decline and an eventual dementia designation. Across memory testing, Daniel Everett exhibited trouble learning novel information, was essentially amnestic after brief delays, and performed poorly across a list-based recognition task. This would suggest evidence for rapid forgetting and a prominent memory storage impairment, both of which are hallmark characteristics of this illness. Impairments in confrontation naming and semantic fluency would also align with typical disease progression. Overall, this remains plausible and the most likely explanation for ongoing decline at the present time.  Daniel Everett was accompanied by his son and daughter during the current feedback session. Content of the current session focused on the results of his neuropsychological evaluation. Daniel Everett was given the  opportunity to ask questions and his questions were answered. He was encouraged to reach out should additional questions arise. A copy of his report was provided at the conclusion of the visit.      30 minutes were spent conducting the current feedback session with Daniel Everett, billed as one unit [phone].

## 2021-11-12 ENCOUNTER — Encounter: Payer: Self-pay | Admitting: Family Medicine

## 2021-11-12 ENCOUNTER — Other Ambulatory Visit: Payer: Self-pay | Admitting: Family Medicine

## 2021-11-12 ENCOUNTER — Ambulatory Visit (INDEPENDENT_AMBULATORY_CARE_PROVIDER_SITE_OTHER): Payer: Medicare HMO | Admitting: Family Medicine

## 2021-11-12 VITALS — BP 120/76 | HR 63 | Temp 97.4°F | Ht 76.0 in | Wt 179.4 lb

## 2021-11-12 DIAGNOSIS — E038 Other specified hypothyroidism: Secondary | ICD-10-CM | POA: Diagnosis not present

## 2021-11-12 DIAGNOSIS — R69 Illness, unspecified: Secondary | ICD-10-CM | POA: Diagnosis not present

## 2021-11-12 DIAGNOSIS — I10 Essential (primary) hypertension: Secondary | ICD-10-CM | POA: Diagnosis not present

## 2021-11-12 DIAGNOSIS — F0280 Dementia in other diseases classified elsewhere without behavioral disturbance: Secondary | ICD-10-CM

## 2021-11-12 DIAGNOSIS — G309 Alzheimer's disease, unspecified: Secondary | ICD-10-CM | POA: Diagnosis not present

## 2021-11-12 MED ORDER — MEMANTINE HCL 28 X 5 MG & 21 X 10 MG PO TABS: ORAL_TABLET | ORAL | 12 refills | Status: DC

## 2021-11-12 MED ORDER — FUROSEMIDE 20 MG PO TABS
20.0000 mg | ORAL_TABLET | Freq: Every day | ORAL | 3 refills | Status: DC
Start: 2021-11-12 — End: 2021-12-06

## 2021-11-12 NOTE — Progress Notes (Signed)
Established Patient Office Visit  Subjective   Patient ID: Daniel Everett, male    DOB: 12-02-1931  Age: 88 y.o. MRN: [id number]  Chief Complaint  Patient presents with   Follow-up    HPI   [name] is here accompanied by daughter.  He has history of atrial fibrillation, hypertension, diastolic heart failure, GERD, hypothyroidism, hyperlipidemia, essential tremor, peripheral neuropathy, history of melanoma, progressive cognitive decline.  He had recent neurocognitive assessment with concern for Alzheimer's dementia.  He has pending follow-up with neurology.  There have been discussion with family regarding potential use of medications for Alzheimer's.  They would like to discuss this further today.  They do have power of attorney-durable and medical in place and his son has been appointed for this.  Family also has in-home care several times per week.  Patient is ambulating very little.  No longer driving.  No recent reported falls or injury.  He has hypothyroidism and had recent TSH which was normal.  Takes levothyroxine for that.  Past Medical History:  Diagnosis Date   Acute encephalopathy 10/01/2021   Acute on chronic diastolic CHF (congestive heart failure)    Allergic rhinitis due to pollen 06/07/2008   Anticoagulated by anticoagulation treatment 01/08/2019   Chronic maxillary sinusitis 08/24/2008   Cortical senile cataract 05/03/2020   Dementia due to late onset Alzheimer's disease 10/31/2021   Dermatophytosis of nail 06/06/2009   Diverticulosis of colon 11/27/2006   Essential hypertension 10/28/2006   Essential tremor 06/25/2016   Gait abnormality 03/02/2018   Generalized osteoarthrosis, involving multiple sites 01/03/2010   GERD (gastroesophageal reflux disease) 02/08/2015   History of colonic polyps 10/28/2006   Hoarseness, chronic 10/18/2008   Hydrocele    S/P repair 03/9507 complicated by seroma   Hyperglycemia 10/18/2008   Hyperlipidemia 10/28/2006    Hypothyroidism 11/27/2006   Leg swelling 10/02/2021   Melanoma of left side of neck 01/29/2019   Nocturia 02/08/2015   Open-angle glaucoma 05/03/2020   Peripheral neuropathy 04/08/2018   Permanent atrial fibrillation 08/20/2018   Personal history of malignant neoplasm of prostate 05/28/2007   Presence of permanent cardiac pacemaker 08/20/2018   Senile nuclear sclerosis 05/03/2020   Past Surgical History:  Procedure Laterality Date   arthroscopy rt knee     EXCISION MASS NECK Left 01/29/2019   Procedure: 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;  Surgeon: [name], MD;  Location: [facility];  Service: ENT;  Laterality: Left;   HYDROCELE EXCISION / REPAIR     INSERT / REPLACE / REMOVE PACEMAKER  08/20/2018   PACEMAKER IMPLANT N/A 08/20/2018   Procedure: PACEMAKER IMPLANT;  Surgeon: Constance Haw, MD;  Location: [facility];  Service: Cardiovascular;  Laterality: N/A;   PROSTATE SURGERY     TURP   ROTATOR CUFF REPAIR      reports that he has never smoked. He has never used smokeless tobacco. He reports that he does not drink alcohol and does not use drugs. family history includes Cancer in his father; Colon cancer in his sister; Coronary artery disease in his mother; Heart disease in his mother; Pancreatic cancer in his father; Tremor in his sister. No Known Allergies  Review of Systems  Constitutional:  Negative for chills, fever, malaise/fatigue and weight loss.  Eyes:  Negative for blurred vision.  Respiratory:  Negative for shortness of breath.   Cardiovascular:  Negative for chest pain.  Neurological:  Positive for tremors. Negative for dizziness, weakness and headaches.  Objective:     BP 120/76 (BP Location: Left Arm, Patient Position: Sitting, Cuff Size: Normal)   Pulse 63   Temp (!) 97.4 F (36.3 C) (Oral)   Ht '6\' 4"'$  (1.93 m)   Wt 179 lb 6.4 oz (81.4 kg)   SpO2 98%   BMI 21.84 kg/m    Physical Exam Vitals reviewed.   Constitutional:      Appearance: Normal appearance.  Cardiovascular:     Rate and Rhythm: Normal rate and regular rhythm.  Pulmonary:     Effort: Pulmonary effort is normal.     Breath sounds: Normal breath sounds.  Musculoskeletal:     Right lower leg: No edema.     Left lower leg: No edema.  Neurological:     Mental Status: He is alert.      No results found for any visits on 11/12/21.    The ASCVD Risk score (Arnett DK, et al., 2019) failed to calculate for the following reasons:   The 2019 ASCVD risk score is only valid for ages 22 to 65    Assessment & Plan:   #1 progressive cognitive decline with probable Alzheimer's dementia.  We reviewed findings from recent neurocognitive assessment.  We did discuss potential medication options.  Would be cautious with medications like Aricept because of his right bundle branch block history.  We did discuss Namenda and I recommended starting Namenda starter pack and give feedback in 1 month.  If tolerating well that time refilled Namenda for 10 mg twice daily -Family do have medical power of attorney and [place] in place -Reiterated no driving and patient seems at peace with that  #2 hypertension stable and at goal.  Continue current dose of lisinopril.  #3 hypothyroidism.  Patient on replacement.  Recent TSH a month ago stable in normal range.  Return in about 6 months (around 05/15/2022).    Carolann Littler, MD

## 2021-11-12 NOTE — Patient Instructions (Signed)
Give me some feedback in one month regarding the Namenda  We can send in refills then if tolerating well.

## 2021-12-05 ENCOUNTER — Other Ambulatory Visit: Payer: Self-pay | Admitting: Family Medicine

## 2021-12-05 ENCOUNTER — Other Ambulatory Visit: Payer: Self-pay | Admitting: Cardiology

## 2021-12-06 ENCOUNTER — Other Ambulatory Visit: Payer: Self-pay | Admitting: Family Medicine

## 2021-12-10 ENCOUNTER — Telehealth: Payer: Self-pay | Admitting: Family Medicine

## 2021-12-10 MED ORDER — MEMANTINE HCL 10 MG PO TABS: 10.0000 mg | ORAL_TABLET | Freq: Two times a day (BID) | ORAL | 3 refills | Status: DC

## 2021-12-10 NOTE — Telephone Encounter (Signed)
Pt requesting refill of  memantine (NAMENDA TITRATION PAK) tablet pack  says the samples gave favorable results     [facility] [place], [place] to Registered [place] Sites Phone:  [phone]  Fax:  [phone]

## 2021-12-10 NOTE — Telephone Encounter (Signed)
Rx sent 

## 2021-12-17 ENCOUNTER — Other Ambulatory Visit: Payer: Self-pay

## 2021-12-17 MED ORDER — LEVOTHYROXINE SODIUM 75 MCG PO TABS: 75.0000 ug | ORAL_TABLET | Freq: Every day | ORAL | 3 refills | Status: DC

## 2021-12-18 ENCOUNTER — Ambulatory Visit (INDEPENDENT_AMBULATORY_CARE_PROVIDER_SITE_OTHER): Payer: Medicare HMO

## 2021-12-18 DIAGNOSIS — I4821 Permanent atrial fibrillation: Secondary | ICD-10-CM | POA: Diagnosis not present

## 2021-12-18 LAB — CUP PACEART REMOTE DEVICE CHECK
Battery Remaining Longevity: 107 mo
Battery Remaining Percentage: 77 %
Battery Voltage: 3.01 V
Brady Statistic RV Percent Paced: 88 %
Date Time Interrogation Session: 20230926020012
Implantable Lead Implant Date: 20200528
Implantable Lead Location: 753860
Implantable Pulse Generator Implant Date: 20200528
Lead Channel Impedance Value: 480 Ohm
Lead Channel Pacing Threshold Amplitude: 0.625 V
Lead Channel Pacing Threshold Pulse Width: 0.5 ms
Lead Channel Sensing Intrinsic Amplitude: 2.7 mV
Lead Channel Setting Pacing Amplitude: 0.875
Lead Channel Setting Pacing Pulse Width: 0.5 ms
Lead Channel Setting Sensing Sensitivity: 2 mV
Pulse Gen Model: 1272
Pulse Gen Serial Number: 9116714

## 2021-12-21 ENCOUNTER — Telehealth: Payer: Self-pay | Admitting: Family Medicine

## 2021-12-21 NOTE — Telephone Encounter (Signed)
Patient's daughter informed of the results and verbalized understanding.

## 2021-12-21 NOTE — Telephone Encounter (Signed)
Noticed redness in his feet that comes and goes, wonders if they should be concerned. Says foot feels tight

## 2021-12-31 NOTE — Progress Notes (Signed)
Remote pacemaker transmission.   

## 2022-01-31 DIAGNOSIS — L89319 Pressure ulcer of right buttock, unspecified stage: Secondary | ICD-10-CM | POA: Diagnosis not present

## 2022-01-31 DIAGNOSIS — D225 Melanocytic nevi of trunk: Secondary | ICD-10-CM | POA: Diagnosis not present

## 2022-01-31 DIAGNOSIS — L905 Scar conditions and fibrosis of skin: Secondary | ICD-10-CM | POA: Diagnosis not present

## 2022-01-31 DIAGNOSIS — L989 Disorder of the skin and subcutaneous tissue, unspecified: Secondary | ICD-10-CM | POA: Diagnosis not present

## 2022-01-31 DIAGNOSIS — D492 Neoplasm of unspecified behavior of bone, soft tissue, and skin: Secondary | ICD-10-CM | POA: Diagnosis not present

## 2022-01-31 DIAGNOSIS — Z08 Encounter for follow-up examination after completed treatment for malignant neoplasm: Secondary | ICD-10-CM | POA: Diagnosis not present

## 2022-01-31 DIAGNOSIS — Z85820 Personal history of malignant melanoma of skin: Secondary | ICD-10-CM | POA: Diagnosis not present

## 2022-01-31 DIAGNOSIS — L814 Other melanin hyperpigmentation: Secondary | ICD-10-CM | POA: Diagnosis not present

## 2022-01-31 DIAGNOSIS — L570 Actinic keratosis: Secondary | ICD-10-CM | POA: Diagnosis not present

## 2022-01-31 DIAGNOSIS — L821 Other seborrheic keratosis: Secondary | ICD-10-CM | POA: Diagnosis not present

## 2022-01-31 DIAGNOSIS — Z713 Dietary counseling and surveillance: Secondary | ICD-10-CM | POA: Diagnosis not present

## 2022-02-01 ENCOUNTER — Other Ambulatory Visit: Payer: Self-pay | Admitting: Family Medicine

## 2022-02-04 DIAGNOSIS — D492 Neoplasm of unspecified behavior of bone, soft tissue, and skin: Secondary | ICD-10-CM | POA: Diagnosis not present

## 2022-02-26 DIAGNOSIS — D0359 Melanoma in situ of other part of trunk: Secondary | ICD-10-CM | POA: Diagnosis not present

## 2022-02-26 DIAGNOSIS — L905 Scar conditions and fibrosis of skin: Secondary | ICD-10-CM | POA: Diagnosis not present

## 2022-02-27 ENCOUNTER — Ambulatory Visit: Payer: Medicare HMO | Admitting: Neurology

## 2022-02-27 ENCOUNTER — Encounter: Payer: Self-pay | Admitting: Neurology

## 2022-02-27 VITALS — BP 146/91 | HR 69 | Ht 76.0 in | Wt 191.0 lb

## 2022-02-27 DIAGNOSIS — G250 Essential tremor: Secondary | ICD-10-CM

## 2022-02-27 NOTE — Patient Instructions (Signed)
You can stop Topamax, monitor tremor Continue follow-up with PCP, return here as needed

## 2022-02-27 NOTE — Progress Notes (Signed)
Patient: Daniel Everett Date of Birth: [birth year]  Reason for Visit: Follow up History from: Patient, daughter Primary Neurologist: Rexene Alberts   ASSESSMENT AND PLAN 88 y.o. year old male   33.  Essential tremor -Stop Topamax, only taking 25 mg at bedtime, unclear if any benefit to tremor -Has previously tried gabapentin, Klonopin, Xanax; cannot try primidone due to Eliquis; beta-blocker was not considered due to being on multiple blood pressure medications -Continue follow-up with PCP, return to our office for new or worsening issues -Neuropsychological evaluation has revealed Alzheimer's disease, he is now on Namenda  HISTORY OF PRESENT ILLNESS: Today 02/27/22 Here with his daughter, lives alone, but has caregiver during the day, family stays at night. Back in June 2023, had a fall, family stepped into help. Tremor in hands, certainly no better. Right is worse. Impacts his eating. He was taking Topamax 25 mg BID, dropped to 1 tablet at night , noted no worsening. He had neuropsychological testing back in August 2023 with Dr. Melvyn Novas who felt his condition was consistent with Alzheimer's disease.  He is now on Namenda.  He is not driving.  HISTORY  Dr. Rexene Alberts 11/01/2021: He reports feeling better.  Per daughter, they have caregivers daily for about 12 hours.  He lives alone.  He fell in June.  He has no obvious family history of tremors.  He does not drink as much water as he showed's per his perception, unclear how much water he drinks.  He does not drink any caffeine or alcohol.  He has been off of alprazolam without repercussions, is not sure if the Topamax is helping.  He has been on and for about 3 years from what I can see in his records.   Of note, he was recently hospitalized for confusion and leg swelling.  He was admitted on 10/01/2021 and discharged on 10/05/2021.  I reviewed the hospital records.  Vitamin B12 was on the lower end of the spectrum at 281, TSH normal at 3.7, RPR negative.   He had a fall at home.  MRI lumbar spine with and without contrast on 10/02/2021 showed: IMPRESSION: 1. No acute abnormality within the lumbar spine. No evidence for cord compression. 2. Moderate multilevel degenerative spondylosis and facet arthrosis as detailed above, most pronounced at L3-4 where there is resultant moderate to severe spinal stenosis. 3. Mild-to-moderate multilevel foraminal narrowing as above, moderate in nature on the left at L4 and bilaterally at L5.  Brain MRI without contrast on 10/03/2021 showed: IMPRESSION: No acute infarction, hemorrhage, or mass.   Parenchymal volume loss and chronic microvascular ischemic changes.   Head CT without contrast from 10/01/2021 showed: IMPRESSION: 1. No evidence of acute intracranial abnormality. 2. Mild chronic small vessel ischemic changes within the cerebral white matter. 3. Mild-to-moderate cerebral atrophy. 4. Mild cerebellar atrophy. 5. Paranasal sinus disease at the imaged levels, as described. 6. Nonspecific 2.9 x 1.0 cm cutaneous/subcutaneous occipital scalp lesion. Direct visualization recommended.   Discharge diagnoses were acute encephalopathy, with active problems of hypothyroidism, permanent atrial fibrillation, leg swelling, acute on chronic diastolic congestive heart failure.  REVIEW OF SYSTEMS: Out of a complete 14 system review of symptoms, the patient complains only of the following symptoms, and all other reviewed systems are negative.  See HPI  ALLERGIES: No Known Allergies  HOME MEDICATIONS: Outpatient Medications Prior to Visit  Medication Sig Dispense Refill   acetaminophen (TYLENOL) 650 MG CR tablet Take 650 mg by mouth 2 (two) times a day.  apixaban (ELIQUIS) 5 MG TABS tablet Take 1 tablet (5 mg total) by mouth 2 (two) times daily. 180 tablet 3   atorvastatin (LIPITOR) 20 MG tablet TAKE 1 TABLET DAILY 90 tablet 1   Cholecalciferol (VITAMIN D3) 50 MCG (2000 UT) TABS Take 2,000 Units by mouth  daily.     finasteride (PROSCAR) 5 MG tablet TAKE 1 TABLET DAILY (Patient taking differently: Take 5 mg by mouth daily.) 90 tablet 1   furosemide (LASIX) 20 MG tablet TAKE 1 TABLET BY MOUTH EVERY DAY 90 tablet 1   levothyroxine (SYNTHROID) 75 MCG tablet Take 1 tablet (75 mcg total) by mouth daily. 90 tablet 3   lisinopril (ZESTRIL) 20 MG tablet TAKE 1 TABLET DAILY (Patient taking differently: Take 20 mg by mouth daily.) 90 tablet 3   memantine (NAMENDA) 10 MG tablet Take 1 tablet (10 mg total) by mouth 2 (two) times daily. 180 tablet 3   omeprazole (PRILOSEC) 20 MG capsule Take 1 capsule (20 mg total) by mouth daily. 90 capsule 3   pyridOXINE (VITAMIN B-6) 100 MG tablet Take 100 mg by mouth daily.     timolol (TIMOPTIC) 0.5 % ophthalmic solution Place 1 drop into the left eye 2 (two) times daily.     topiramate (TOPAMAX) 25 MG tablet Take 1 tablet (25 mg total) by mouth at bedtime. TAKE 1 TABLET BY MOUTH TWICE A DAY 90 tablet 0   triamcinolone cream (KENALOG) 0.1 % Apply 1 Application topically 2 (two) times daily. (Patient taking differently: Apply 1 Application topically 2 (two) times daily. Apply to sores on both legs) 45 g 1   No facility-administered medications prior to visit.    PAST MEDICAL HISTORY: Past Medical History:  Diagnosis Date   Acute encephalopathy 10/01/2021   Acute on chronic diastolic CHF (congestive heart failure)    Allergic rhinitis due to pollen 06/07/2008   Anticoagulated by anticoagulation treatment 01/08/2019   Chronic maxillary sinusitis 08/24/2008   Cortical senile cataract 05/03/2020   Dementia due to late onset Alzheimer's disease 10/31/2021   Dermatophytosis of nail 06/06/2009   Diverticulosis of colon 11/27/2006   Essential hypertension 10/28/2006   Essential tremor 06/25/2016   Gait abnormality 03/02/2018   Generalized osteoarthrosis, involving multiple sites 01/03/2010   GERD (gastroesophageal reflux disease) 02/08/2015   History of colonic polyps  10/28/2006   Hoarseness, chronic 10/18/2008   Hydrocele    S/P repair 04/4095 complicated by seroma   Hyperglycemia 10/18/2008   Hyperlipidemia 10/28/2006   Hypothyroidism 11/27/2006   Leg swelling 10/02/2021   Melanoma of left side of neck 01/29/2019   Nocturia 02/08/2015   Open-angle glaucoma 05/03/2020   Peripheral neuropathy 04/08/2018   Permanent atrial fibrillation 08/20/2018   Personal history of malignant neoplasm of prostate 05/28/2007   Presence of permanent cardiac pacemaker 08/20/2018   Senile nuclear sclerosis 05/03/2020    PAST SURGICAL HISTORY: Past Surgical History:  Procedure Laterality Date   arthroscopy rt knee     EXCISION MASS NECK Left 01/29/2019   Procedure: 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;  Surgeon: [name], MD;  Location: [facility];  Service: ENT;  Laterality: Left;   HYDROCELE EXCISION / REPAIR     INSERT / REPLACE / REMOVE PACEMAKER  08/20/2018   PACEMAKER IMPLANT N/A 08/20/2018   Procedure: PACEMAKER IMPLANT;  Surgeon: Constance Haw, MD;  Location: [facility];  Service: Cardiovascular;  Laterality: N/A;   PROSTATE SURGERY     TURP   ROTATOR  CUFF REPAIR      FAMILY HISTORY: Family History  Problem Relation Age of Onset   Coronary artery disease Mother    Heart disease Mother    Pancreatic cancer Father    Cancer Father    Tremor Sister    Colon cancer Sister        2021   Esophageal cancer Neg Hx    Stomach cancer Neg Hx    Rectal cancer Neg Hx     SOCIAL HISTORY: Social History   Socioeconomic History   Marital status: Widowed    Spouse name: Not on file   Number of children: 3   Years of education: 16   Highest education level: Bachelor's degree (e.g., BA, AB, BS)  Occupational History   Occupation: retired  Tobacco Use   Smoking status: Never   Smokeless tobacco: Never  Vaping Use   Vaping Use: Never used  Substance and Sexual Activity   Alcohol use: No   Drug use: No   Sexual  activity: Not Currently  Other Topics Concern   Not on file  Social History Narrative   Lives alone   Caffeine use: very little   Right handed       3 children    2 in [place] and one in near [facility] graduate and played basketball there   Graduated in 59    Social Determinants of Health   Financial Resource Strain: Low Risk  (03/28/2021)   Overall Financial Resource Strain (CARDIA)    Difficulty of Paying Living Expenses: Not hard at all  Food Insecurity: No Food Insecurity (03/28/2021)   Hunger Vital Sign    Worried About Running Out of Food in the Last Year: Never true    Ran Out of Food in the Last Year: Never true  Transportation Needs: No Transportation Needs (03/28/2021)   PRAPARE - Hydrologist (Medical): No    Lack of Transportation (Non-Medical): No  Physical Activity: Inactive (03/28/2021)   Exercise Vital Sign    Days of Exercise per Week: 0 days    Minutes of Exercise per Session: 0 min  Stress: No Stress Concern Present (03/28/2021)   Heil    Feeling of Stress : Not at all  Social Connections: Moderately Integrated (03/28/2021)   Social Connection and Isolation Panel [NHANES]    Frequency of Communication with Friends and Family: More than three times a week    Frequency of Social Gatherings with Friends and Family: More than three times a week    Attends Religious Services: More than 4 times per year    Active Member of [employer] or Organizations: Yes    Attends Archivist Meetings: More than 4 times per year    Marital Status: Widowed  Intimate Partner Violence: Not At Risk (03/28/2021)   Humiliation, Afraid, Rape, and Kick questionnaire    Fear of Current or Ex-Partner: No    Emotionally Abused: No    Physically Abused: No    Sexually Abused: No    PHYSICAL EXAM  Vitals:   02/27/22 1338  BP: (!) 146/91  Pulse: 69  Weight: 191 lb (86.6 kg)   Height: '6\' 4"'$  (1.93 m)   Body mass index is 23.25 kg/m.  Generalized: Well developed, in no acute distress  Neurological examination  Mentation: Alert oriented to time, place, history taking. Follows all commands speech and language fluent. Very pleasant, calm.  Cranial nerve II-XII: Pupils were equal round reactive to light. Extraocular movements were full, visual field were full on confrontational test. Facial sensation and strength were normal. Head turning and shoulder shrug  were normal and symmetric. Motor: The motor testing reveals 5 over 5 strength of all 4 extremities. Good symmetric motor tone is noted throughout.  No significant bradykinesia noted.  Or rigidity. Sensory: Sensory testing is intact to soft touch on all 4 extremities. No evidence of extinction is noted.  Coordination: Cerebellar testing reveals good finger-nose-finger and heel-to-shin bilaterally.  Mild tremor with finger-nose-finger bilaterally.  Mild postural tremor.  Moderate tremor with handwriting sample. Gait and station: Has to push off seated position to stand, posture is stooped, uses rolling walker  DIAGNOSTIC DATA (LABS, IMAGING, TESTING) - I reviewed patient records, labs, notes, testing and imaging myself where available.  Lab Results  Component Value Date   WBC 6.1 10/02/2021   HGB 13.5 10/02/2021   HCT 41.9 10/02/2021   MCV 103.2 (H) 10/02/2021   PLT 181 10/02/2021      Component Value Date/Time   NA 141 10/03/2021 0448   NA 139 04/04/2016 1413   K 3.4 (L) 10/03/2021 0448   CL 109 10/03/2021 0448   CO2 24 10/03/2021 0448   GLUCOSE 113 (H) 10/03/2021 0448   BUN 22 10/03/2021 0448   BUN 16 04/04/2016 1413   CREATININE 1.02 10/03/2021 0448   CREATININE 0.93 11/02/2020 1239   CALCIUM 8.6 (L) 10/03/2021 0448   PROT 5.5 (L) 10/02/2021 0411   PROT 6.2 04/08/2018 1503   ALBUMIN 3.0 (L) 10/02/2021 0411   AST 35 10/02/2021 0411   AST 28 11/02/2020 1239   ALT 35 10/02/2021 0411   ALT 18  11/02/2020 1239   ALKPHOS 48 10/02/2021 0411   BILITOT 1.1 10/02/2021 0411   BILITOT 0.9 11/02/2020 1239   GFRNONAA >60 10/03/2021 0448   GFRNONAA >60 11/02/2020 1239   GFRAA >60 09/28/2019 0948   Lab Results  Component Value Date   CHOL 137 05/15/2021   HDL 47.50 05/15/2021   LDLCALC 77 05/15/2021   TRIG 65.0 05/15/2021   CHOLHDL 3 05/15/2021   Lab Results  Component Value Date   HGBA1C 6.0 10/18/2008   Lab Results  Component Value Date   VITAMINB12 281 10/02/2021   Lab Results  Component Value Date   TSH 3.693 10/01/2021    Butler Denmark, AGNP-C, DNP 02/27/2022, 1:49 PM [facility] [address], [place], [place] [address] [phone]

## 2022-03-04 DIAGNOSIS — H401123 Primary open-angle glaucoma, left eye, severe stage: Secondary | ICD-10-CM | POA: Diagnosis not present

## 2022-03-04 DIAGNOSIS — D3131 Benign neoplasm of right choroid: Secondary | ICD-10-CM | POA: Diagnosis not present

## 2022-03-04 DIAGNOSIS — H401111 Primary open-angle glaucoma, right eye, mild stage: Secondary | ICD-10-CM | POA: Diagnosis not present

## 2022-03-04 DIAGNOSIS — Z961 Presence of intraocular lens: Secondary | ICD-10-CM | POA: Diagnosis not present

## 2022-03-15 ENCOUNTER — Other Ambulatory Visit: Payer: Self-pay | Admitting: Family Medicine

## 2022-03-19 ENCOUNTER — Ambulatory Visit (INDEPENDENT_AMBULATORY_CARE_PROVIDER_SITE_OTHER): Payer: Medicare HMO

## 2022-03-19 DIAGNOSIS — I4821 Permanent atrial fibrillation: Secondary | ICD-10-CM | POA: Diagnosis not present

## 2022-03-20 LAB — CUP PACEART REMOTE DEVICE CHECK
Battery Remaining Longevity: 106 mo
Battery Remaining Percentage: 76 %
Battery Voltage: 3.01 V
Brady Statistic RV Percent Paced: 91 %
Date Time Interrogation Session: 20231226020014
Implantable Lead Connection Status: 753985
Implantable Lead Implant Date: 20200528
Implantable Lead Location: 753860
Implantable Pulse Generator Implant Date: 20200528
Lead Channel Impedance Value: 510 Ohm
Lead Channel Pacing Threshold Amplitude: 0.625 V
Lead Channel Pacing Threshold Pulse Width: 0.5 ms
Lead Channel Sensing Intrinsic Amplitude: 2.7 mV
Lead Channel Setting Pacing Amplitude: 0.875
Lead Channel Setting Pacing Pulse Width: 0.5 ms
Lead Channel Setting Sensing Sensitivity: 2 mV
Pulse Gen Model: 1272
Pulse Gen Serial Number: 9116714

## 2022-03-29 ENCOUNTER — Ambulatory Visit (INDEPENDENT_AMBULATORY_CARE_PROVIDER_SITE_OTHER): Payer: Medicare HMO

## 2022-03-29 VITALS — BP 132/60 | HR 63 | Temp 97.6°F | Ht 76.0 in | Wt 192.0 lb

## 2022-03-29 DIAGNOSIS — Z0000 Encounter for general adult medical examination without abnormal findings: Secondary | ICD-10-CM | POA: Diagnosis not present

## 2022-03-29 NOTE — Patient Instructions (Addendum)
Daniel Everett , Thank you for taking time to come for your Medicare Wellness Visit. I appreciate your ongoing commitment to your health goals. Please review the following plan we discussed and let me know if I can assist you in the future.   These are the goals we discussed:  Goals       Exercise 150 min/wk Moderate Activity      Would like to [facility] back [facility] when or if pandemic settles.      Stay Healthy (pt-stated)        This is a list of the screening recommended for you and due dates:  Health Maintenance  Topic Date Due   COVID-19 Vaccine (7 - 2023-24 season) 04/14/2022*   DTaP/Tdap/Td vaccine (4 - Td or Tdap) 06/25/2022   Medicare Annual Wellness Visit  03/30/2023   Pneumonia Vaccine  Completed   Flu Shot  Completed   HPV Vaccine  Aged Out   Zoster (Shingles) Vaccine  Discontinued  *Topic was postponed. The date shown is not the original due date.    Advanced directives: Please bring a copy of your health care power of attorney and living will to the office to be added to your chart at your convenience.   Conditions/risks identified: None  Next appointment: Follow up in one year for your annual wellness visit.    Preventive Care 60 Years and Older, Male  Preventive care refers to lifestyle choices and visits with your health care provider that can promote health and wellness. What does preventive care include? A yearly physical exam. This is also called an annual well check. Dental exams once or twice a year. Routine eye exams. Ask your health care provider how often you should have your eyes checked. Personal lifestyle choices, including: Daily care of your teeth and gums. Regular physical activity. Eating a healthy diet. Avoiding tobacco and drug use. Limiting alcohol use. Practicing safe sex. Taking low doses of aspirin every day. Taking vitamin and mineral supplements as recommended by your health care provider. What happens during an annual well check? The  services and screenings done by your health care provider during your annual well check will depend on your age, overall health, lifestyle risk factors, and family history of disease. Counseling  Your health care provider may ask you questions about your: Alcohol use. Tobacco use. Drug use. Emotional well-being. Home and relationship well-being. Sexual activity. Eating habits. History of falls. Memory and ability to understand (cognition). Work and work Statistician. Screening  You may have the following tests or measurements: Height, weight, and BMI. Blood pressure. Lipid and cholesterol levels. These may be checked every 5 years, or more frequently if you are over 41 years old. Skin check. Lung cancer screening. You may have this screening every year starting at age 39 if you have a 30-pack-year history of smoking and currently smoke or have quit within the past 15 years. Fecal occult blood test (FOBT) of the stool. You may have this test every year starting at age 2. Flexible sigmoidoscopy or colonoscopy. You may have a sigmoidoscopy every 5 years or a colonoscopy every 10 years starting at age 89. Prostate cancer screening. Recommendations will vary depending on your family history and other risks. Hepatitis C blood test. Hepatitis B blood test. Sexually transmitted disease (STD) testing. Diabetes screening. This is done by checking your blood sugar (glucose) after you have not eaten for a while (fasting). You may have this done every 1-3 years. Abdominal aortic aneurysm (AAA)  screening. You may need this if you are a current or former smoker. Osteoporosis. You may be screened starting at age 84 if you are at high risk. Talk with your health care provider about your test results, treatment options, and if necessary, the need for more tests. Vaccines  Your health care provider may recommend certain vaccines, such as: Influenza vaccine. This is recommended every year. Tetanus,  diphtheria, and acellular pertussis (Tdap, Td) vaccine. You may need a Td booster every 10 years. Zoster vaccine. You may need this after age 37. Pneumococcal 13-valent conjugate (PCV13) vaccine. One dose is recommended after age 76. Pneumococcal polysaccharide (PPSV23) vaccine. One dose is recommended after age 17. Talk to your health care provider about which screenings and vaccines you need and how often you need them. This information is not intended to replace advice given to you by your health care provider. Make sure you discuss any questions you have with your health care provider. Document Released: 04/07/2015 Document Revised: 11/29/2015 Document Reviewed: 01/10/2015 Elsevier Interactive Patient Education  2017 [place] Prevention in the Home Falls can cause injuries. They can happen to people of all ages. There are many things you can do to make your home safe and to help prevent falls. What can I do on the outside of my home? Regularly fix the edges of walkways and driveways and fix any cracks. Remove anything that might make you trip as you walk through a door, such as a raised step or threshold. Trim any bushes or trees on the path to your home. Use bright outdoor lighting. Clear any walking paths of anything that might make someone trip, such as rocks or tools. Regularly check to see if handrails are loose or broken. Make sure that both sides of any steps have handrails. Any raised decks and porches should have guardrails on the edges. Have any leaves, snow, or ice cleared regularly. Use sand or salt on walking paths during winter. Clean up any spills in your garage right away. This includes oil or grease spills. What can I do in the bathroom? Use night lights. Install grab bars by the toilet and in the tub and shower. Do not use towel bars as grab bars. Use non-skid mats or decals in the tub or shower. If you need to sit down in the shower, use a plastic,  non-slip stool. Keep the floor dry. Clean up any water that spills on the floor as soon as it happens. Remove soap buildup in the tub or shower regularly. Attach bath mats securely with double-sided non-slip rug tape. Do not have throw rugs and other things on the floor that can make you trip. What can I do in the bedroom? Use night lights. Make sure that you have a light by your bed that is easy to reach. Do not use any sheets or blankets that are too big for your bed. They should not hang down onto the floor. Have a firm chair that has side arms. You can use this for support while you get dressed. Do not have throw rugs and other things on the floor that can make you trip. What can I do in the kitchen? Clean up any spills right away. Avoid walking on wet floors. Keep items that you use a lot in easy-to-reach places. If you need to reach something above you, use a strong step stool that has a grab bar. Keep electrical cords out of the way. Do not use floor polish or  wax that makes floors slippery. If you must use wax, use non-skid floor wax. Do not have throw rugs and other things on the floor that can make you trip. What can I do with my stairs? Do not leave any items on the stairs. Make sure that there are handrails on both sides of the stairs and use them. Fix handrails that are broken or loose. Make sure that handrails are as long as the stairways. Check any carpeting to make sure that it is firmly attached to the stairs. Fix any carpet that is loose or worn. Avoid having throw rugs at the top or bottom of the stairs. If you do have throw rugs, attach them to the floor with carpet tape. Make sure that you have a light switch at the top of the stairs and the bottom of the stairs. If you do not have them, ask someone to add them for you. What else can I do to help prevent falls? Wear shoes that: Do not have high heels. Have rubber bottoms. Are comfortable and fit you well. Are closed  at the toe. Do not wear sandals. If you use a stepladder: Make sure that it is fully opened. Do not climb a closed stepladder. Make sure that both sides of the stepladder are locked into place. Ask someone to hold it for you, if possible. Clearly mark and make sure that you can see: Any grab bars or handrails. First and last steps. Where the edge of each step is. Use tools that help you move around (mobility aids) if they are needed. These include: Canes. Walkers. Scooters. Crutches. Turn on the lights when you go into a dark area. Replace any light bulbs as soon as they burn out. Set up your furniture so you have a clear path. Avoid moving your furniture around. If any of your floors are uneven, fix them. If there are any pets around you, be aware of where they are. Review your medicines with your doctor. Some medicines can make you feel dizzy. This can increase your chance of falling. Ask your doctor what other things that you can do to help prevent falls. This information is not intended to replace advice given to you by your health care provider. Make sure you discuss any questions you have with your health care provider. Document Released: 01/05/2009 Document Revised: 08/17/2015 Document Reviewed: 04/15/2014 Elsevier Interactive Patient Education  2017 [employer].

## 2022-03-29 NOTE — Progress Notes (Signed)
Subjective:   Daniel Everett is a 89 y.o. male who presents for Medicare Annual/Subsequent preventive examination.  Review of Systems      Cardiac Risk Factors include: advanced age (>58mn, >>42women);hypertension;male gender     Objective:    Today's Vitals   03/29/22 1345  BP: 132/60  Pulse: 63  Temp: 97.6 F (36.4 C)  SpO2: 98%  Weight: 192 lb (87.1 kg)  Height: '6\' 4"'$  (1.93 m)   Body mass index is 23.37 kg/m.     03/29/2022    2:11 PM 10/02/2021    1:14 AM 10/01/2021   11:54 AM 09/14/2021    4:58 PM 03/28/2021    2:06 PM 10/05/2019   10:33 AM 03/30/2019   10:55 AM  Advanced Directives  Does Patient Have a Medical Advance Directive? Yes Yes No No Yes Yes Yes  Type of AParamedicof [place]rentonLiving will Healthcare Power of [place]oll[place]Living will [place]Living will [place] Does patient want to make changes to medical advance directive?  No - Patient declined   No - Patient declined No - Patient declined   Copy of H[place]vertin Chart? No - copy requested No - copy requested   No - copy requested No - copy requested No - copy requested  Would patient like information on creating a medical advance directive?  No - Guardian declined No - Patient declined No - Patient declined       Current Medications (verified) Outpatient Encounter Medications as of 03/29/2022  Medication Sig   acetaminophen (TYLENOL) 650 MG CR tablet Take 650 mg by mouth 2 (two) times a day.   apixaban (ELIQUIS) 5 MG TABS tablet Take 1 tablet (5 mg total) by mouth 2 (two) times daily.   atorvastatin (LIPITOR) 20 MG tablet TAKE 1 TABLET DAILY   Cholecalciferol (VITAMIN D3) 50 MCG (2000 UT) TABS Take 2,000 Units by mouth daily.   finasteride (PROSCAR) 5 MG tablet TAKE 1 TABLET DAILY   furosemide (LASIX) 20 MG tablet TAKE 1 TABLET BY MOUTH EVERY DAY   levothyroxine (SYNTHROID) 75 MCG tablet Take 1  tablet (75 mcg total) by mouth daily.   lisinopril (ZESTRIL) 20 MG tablet TAKE 1 TABLET DAILY (Patient taking differently: Take 20 mg by mouth daily.)   memantine (NAMENDA) 10 MG tablet Take 1 tablet (10 mg total) by mouth 2 (two) times daily.   omeprazole (PRILOSEC) 20 MG capsule Take 1 capsule (20 mg total) by mouth daily.   pyridOXINE (VITAMIN B-6) 100 MG tablet Take 100 mg by mouth daily.   timolol (TIMOPTIC) 0.5 % ophthalmic solution Place 1 drop into the left eye 2 (two) times daily.   triamcinolone cream (KENALOG) 0.1 % Apply 1 Application topically 2 (two) times daily. (Patient taking differently: Apply 1 Application topically 2 (two) times daily. Apply to sores on both legs)   No facility-administered encounter medications on file as of 03/29/2022.    Allergies (verified) Patient has no known allergies.   History: Past Medical History:  Diagnosis Date   Acute encephalopathy 10/01/2021   Acute on chronic diastolic CHF (congestive heart failure)    Allergic rhinitis due to pollen 06/07/2008   Anticoagulated by anticoagulation treatment 01/08/2019   Chronic maxillary sinusitis 08/24/2008   Cortical senile cataract 05/03/2020   Dementia due to late onset Alzheimer's disease 10/31/2021   Dermatophytosis of nail 06/06/2009   Diverticulosis of colon 11/27/2006  Essential hypertension 10/28/2006   Essential tremor 06/25/2016   Gait abnormality 03/02/2018   Generalized osteoarthrosis, involving multiple sites 01/03/2010   GERD (gastroesophageal reflux disease) 02/08/2015   History of colonic polyps 10/28/2006   Hoarseness, chronic 10/18/2008   Hydrocele    S/P repair 03/9145 complicated by seroma   Hyperglycemia 10/18/2008   Hyperlipidemia 10/28/2006   Hypothyroidism 11/27/2006   Leg swelling 10/02/2021   Melanoma of left side of neck 01/29/2019   Nocturia 02/08/2015   Open-angle glaucoma 05/03/2020   Peripheral neuropathy 04/08/2018   Permanent atrial fibrillation 08/20/2018    Personal history of malignant neoplasm of prostate 05/28/2007   Presence of permanent cardiac pacemaker 08/20/2018   Senile nuclear sclerosis 05/03/2020   Past Surgical History:  Procedure Laterality Date   arthroscopy rt knee     EXCISION MASS NECK Left 01/29/2019   Procedure: 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;  Surgeon: [name], MD;  Location: [facility];  Service: ENT;  Laterality: Left;   HYDROCELE EXCISION / REPAIR     INSERT / REPLACE / REMOVE PACEMAKER  08/20/2018   PACEMAKER IMPLANT N/A 08/20/2018   Procedure: PACEMAKER IMPLANT;  Surgeon: Constance Haw, MD;  Location: [facility];  Service: Cardiovascular;  Laterality: N/A;   PROSTATE SURGERY     TURP   ROTATOR CUFF REPAIR     Family History  Problem Relation Age of Onset   Coronary artery disease Mother    Heart disease Mother    Pancreatic cancer Father    Cancer Father    Tremor Sister    Colon cancer Sister        2021   Esophageal cancer Neg Hx    Stomach cancer Neg Hx    Rectal cancer Neg Hx    Social History   Socioeconomic History   Marital status: Widowed    Spouse name: Not on file   Number of children: 3   Years of education: 16   Highest education level: Bachelor's degree (e.g., BA, AB, BS)  Occupational History   Occupation: retired  Tobacco Use   Smoking status: Never   Smokeless tobacco: Never  Vaping Use   Vaping Use: Never used  Substance and Sexual Activity   Alcohol use: No   Drug use: No   Sexual activity: Not Currently  Other Topics Concern   Not on file  Social History Narrative   Lives alone   Caffeine use: very little   Right handed       3 children    2 in [place] and one in near [facility] graduate and played basketball there   Graduated in 59    Social Determinants of Health   Financial Resource Strain: Low Risk  (03/29/2022)   Overall Financial Resource Strain (CARDIA)    Difficulty of Paying Living Expenses: Not hard  at all  Food Insecurity: No Food Insecurity (03/29/2022)   Hunger Vital Sign    Worried About Running Out of Food in the Last Year: Never true    Ran Out of Food in the Last Year: Never true  Transportation Needs: Unmet Transportation Needs (03/29/2022)   PRAPARE - Hydrologist (Medical): Yes    Lack of Transportation (Non-Medical): No  Physical Activity: Insufficiently Active (03/29/2022)   Exercise Vital Sign    Days of Exercise per Week: 7 days    Minutes of Exercise per Session: 20 min  Stress:  No Stress Concern Present (03/29/2022)   [place]    Feeling of Stress : Not at all  Social Connections: Moderately Integrated (03/29/2022)   Social Connection and Isolation Panel [NHANES]    Frequency of Communication with Friends and Family: More than three times a week    Frequency of Social Gatherings with Friends and Family: More than three times a week    Attends Religious Services: More than 4 times per year    Active Member of [employer] or Organizations: Yes    Attends Archivist Meetings: More than 4 times per year    Marital Status: Widowed    Tobacco Counseling Counseling given: Not Answered   Clinical Intake:  Pre-visit preparation completed: No  Pain : No/denies pain     BMI - recorded: 23.37 Nutritional Status: BMI of 19-24  Normal Nutritional Risks: None Diabetes: No  How often do you need to have someone help you when you read instructions, pamphlets, or other written materials from your doctor or pharmacy?: 4 - Often (Family and aide assist)  Diabetic?  No  Interpreter Needed?: No  Information entered by :: Rolene Arbour LPN   Activities of Daily Living    03/29/2022    2:09 PM 10/02/2021    1:14 AM  In your present state of health, do you have any difficulty performing the following activities:  Hearing? 0 1  Vision? 0 0  Difficulty concentrating or making  decisions? 0 1  Walking or climbing stairs? 0 1  Dressing or bathing? 0 0  Doing errands, shopping? 0 0  Preparing Food and eating ? N   Using the Toilet? N   In the past six months, have you accidently leaked urine? N   Do you have problems with loss of bowel control? N   Managing your Medications? N   Managing your Finances? N   Housekeeping or managing your Housekeeping? N     Patient Care Team: [name], MD as PCP - General (Family Medicine) Constance Haw, MD as PCP - Electrophysiology (Cardiology) Martin[place], Peter M, MD as PCP - Cardiology (Cardiology)  Indicate any recent Medical Services you may have received from other than [facility] providers in the past year (date may be approximate).     Assessment:   This is a routine wellness examination for Daniel Everett.  Hearing/Vision screen Hearing Screening - Comments:: Denies hearing difficulties   Vision Screening - Comments:: Wears rx glasses - up to date with routine eye exams with  Dr Celene Squibb  Dietary issues and exercise activities discussed: Current Exercise Habits: Home exercise routine, Type of exercise: walking, Time (Minutes): 20, Frequency (Times/Week): 7, Weekly Exercise (Minutes/Week): 140, Intensity: Moderate, Exercise limited by: None identified   Goals Addressed               This Visit's Progress     Stay Healthy (pt-stated)         Depression Screen    03/29/2022    1:44 PM 05/22/2021    2:43 PM 03/28/2021    1:58 PM 05/03/2020    2:57 PM 03/01/2019    9:03 AM 10/30/2018    7:44 AM 07/09/2017   11:15 AM  PHQ 2/9 Scores  PHQ - 2 Score 0 0 0 0 0 0 0  PHQ- 9 Score  2  2  0     Fall Risk    03/29/2022    2:10 PM 05/14/2021  2:05 PM 03/28/2021    2:01 PM 10/28/2019    3:09 PM 05/04/2019    9:35 AM  Fall Risk   Falls in the past year? 1 0 0 0 0  Number falls in past yr: 0 0 0  0  Injury with Fall? 0 0 0    Comment Followed by Medical Attention      Risk for fall due to : No Fall Risks;Impaired  balance/gait    Impaired balance/gait  Follow up Falls prevention discussed    Education provided;Falls prevention discussed;Follow up appointment    FALL RISK PREVENTION PERTAINING TO THE HOME:  Any stairs in or around the home? No If so, are there any without handrails? No  Home free of loose throw rugs in walkways, pet beds, electrical cords, etc? Yes  Adequate lighting in your home to reduce risk of falls? Yes   ASSISTIVE DEVICES UTILIZED TO PREVENT FALLS:  Life alert? No  Use of a cane, walker or w/c? Yes  Grab bars in the bathroom? No  Shower chair or bench in shower? No  Elevated toilet seat or a handicapped toilet? No   TIMED UP AND GO:  Was the test performed? Yes .  Length of time to ambulate 10 feet: 10 sec.   Gait slow and steady with assistive device  Cognitive Function:        03/29/2022    2:12 PM 03/28/2021    2:04 PM 03/01/2019    9:10 AM  6CIT Screen  What Year? 0 points 0 points 0 points  What month? 0 points 0 points 0 points  What time? 0 points 0 points 0 points  Count back from 20 0 points 0 points 0 points  Months in reverse 0 points 0 points 0 points  Repeat phrase 4 points 2 points 2 points  Total Score 4 points 2 points 2 points    Immunizations Immunization History  Administered Date(s) Administered   Fluad Quad(high Dose 65+) 12/08/2018, 02/10/2020, 02/08/2021   Influenza Split 12/31/2010, 12/17/2011, 01/24/2015   Influenza Whole 03/25/2001, 01/28/2007, 12/10/2007, 02/08/2009, 01/03/2010   Influenza, High Dose Seasonal PF 12/18/2012, 02/08/2015, 11/28/2015, 01/24/2016, 12/25/2016, 01/08/2018, 12/29/2021   Influenza,inj,Quad PF,6+ Mos 02/07/2014   Influenza-Unspecified 12/24/1999, 01/23/2001, 01/23/2002, 01/27/2002, 02/01/2003, 01/24/2004, 01/03/2005, 12/23/2005, 12/24/2006, 03/25/2009, 01/24/2011, 11/24/2011, 12/24/2011, 11/24/2018   Moderna Covid-19 Vaccine Bivalent Booster 100yr & up 02/08/2021   Moderna Sars-Covid-2 Vaccination  05/07/2019, 06/04/2019, 02/10/2020, 08/08/2020   Pneumococcal Conjugate-13 05/24/2013, 02/07/2014   Pneumococcal Polysaccharide-23 03/25/2001, 06/24/2012   Pneumococcal-Unspecified 01/23/2002   Td 03/25/2001, 06/24/2012   Tdap 05/20/2011   Unspecified SARS-COV-2 Vaccination 12/29/2021   Zoster Recombinat (Shingrix) 07/30/2019, 07/30/2019   Zoster, Live 03/25/2010, 05/20/2012    TDAP status: Up to date  Flu Vaccine status: Up to date  Pneumococcal vaccine status: Up to date  Covid-19 vaccine status: Completed vaccines  Qualifies for Shingles Vaccine? Yes   Zostavax completed Yes   Shingrix Completed?: Yes  Screening Tests Health Maintenance  Topic Date Due   COVID-19 Vaccine (7 - 2023-24 season) 04/14/2022 (Originally 02/23/2022)   DTaP/Tdap/Td (4 - Td or Tdap) 06/25/2022   Medicare Annual Wellness (AWV)  03/30/2023   Pneumonia Vaccine 89 Years old  Completed   INFLUENZA VACCINE  Completed   HPV VACCINES  Aged Out   Zoster Vaccines- Shingrix  Discontinued    Health Maintenance  There are no preventive care reminders to display for this patient.   Colorectal cancer screening: No longer required.   Lung  Cancer Screening: (Low Dose CT Chest recommended if Age 18-80 years, 30 pack-year currently smoking OR have quit w/in 15years.) does not qualify.     Additional Screening:  Hepatitis C Screening: does not qualify; Completed   Vision Screening: Recommended annual ophthalmology exams for early detection of glaucoma and other disorders of the eye. Is the patient up to date with their annual eye exam?  Yes  Who is the provider or what is the name of the office in which the patient attends annual eye exams? Dr Celene Squibb If pt is not established with a provider, would they like to be referred to a provider to establish care? No .   Dental Screening: Recommended annual dental exams for proper oral hygiene  Community Resource Referral / Chronic Care Management:  CRR  required this visit?  No   CCM required this visit?  No      Plan:     I have personally reviewed and noted the following in the patient's chart:   Medical and social history Use of alcohol, tobacco or illicit drugs  Current medications and supplements including opioid prescriptions. Patient is not currently taking opioid prescriptions. Functional ability and status Nutritional status Physical activity Advanced directives List of other physicians Hospitalizations, surgeries, and ER visits in previous 12 months Vitals Screenings to include cognitive, depression, and falls Referrals and appointments  In addition, I have reviewed and discussed with patient certain preventive protocols, quality metrics, and best practice recommendations. A written personalized care plan for preventive services as well as general preventive health recommendations were provided to patient.     Criselda Peaches, LPN   6/0/6301   Nurse Notes: None

## 2022-04-10 ENCOUNTER — Telehealth: Payer: Self-pay | Admitting: Oncology

## 2022-04-10 NOTE — Telephone Encounter (Signed)
Called patient about Dr. Danise Mina departure. Patient is aware that we have cancelled his appointment, he did not want to schedule with another doctor at this time, He will get his daughter to call back to schedule an appointment. I have also called daughter and gave her the details of the conversation held with the patient.

## 2022-04-15 NOTE — Progress Notes (Signed)
Remote pacemaker transmission.   

## 2022-04-25 ENCOUNTER — Ambulatory Visit: Payer: Medicare HMO | Admitting: Oncology

## 2022-05-15 ENCOUNTER — Encounter: Payer: Self-pay | Admitting: Family Medicine

## 2022-05-15 ENCOUNTER — Ambulatory Visit (INDEPENDENT_AMBULATORY_CARE_PROVIDER_SITE_OTHER): Payer: Medicare HMO | Admitting: Family Medicine

## 2022-05-15 VITALS — BP 138/62 | HR 64 | Ht 76.0 in | Wt 195.3 lb

## 2022-05-15 DIAGNOSIS — I4821 Permanent atrial fibrillation: Secondary | ICD-10-CM

## 2022-05-15 DIAGNOSIS — I10 Essential (primary) hypertension: Secondary | ICD-10-CM | POA: Diagnosis not present

## 2022-05-15 DIAGNOSIS — Z79899 Other long term (current) drug therapy: Secondary | ICD-10-CM

## 2022-05-15 DIAGNOSIS — E038 Other specified hypothyroidism: Secondary | ICD-10-CM

## 2022-05-15 DIAGNOSIS — E7800 Pure hypercholesterolemia, unspecified: Secondary | ICD-10-CM

## 2022-05-15 LAB — CBC WITH DIFFERENTIAL/PLATELET
Basophils Absolute: 0 10*3/uL (ref 0.0–0.1)
Basophils Relative: 0.8 % (ref 0.0–3.0)
Eosinophils Absolute: 0.1 10*3/uL (ref 0.0–0.7)
Eosinophils Relative: 0.9 % (ref 0.0–5.0)
HCT: 46.3 % (ref 39.0–52.0)
Hemoglobin: 15.6 g/dL (ref 13.0–17.0)
Lymphocytes Relative: 27.1 % (ref 12.0–46.0)
Lymphs Abs: 1.7 10*3/uL (ref 0.7–4.0)
MCHC: 33.6 g/dL (ref 30.0–36.0)
MCV: 98 fl (ref 78.0–100.0)
Monocytes Absolute: 0.5 10*3/uL (ref 0.1–1.0)
Monocytes Relative: 7.3 % (ref 3.0–12.0)
Neutro Abs: 3.9 10*3/uL (ref 1.4–7.7)
Neutrophils Relative %: 63.9 % (ref 43.0–77.0)
Platelets: 188 10*3/uL (ref 150.0–400.0)
RBC: 4.73 Mil/uL (ref 4.22–5.81)
RDW: 14.6 % (ref 11.5–15.5)
WBC: 6.2 10*3/uL (ref 4.0–10.5)

## 2022-05-15 LAB — TSH: TSH: 5.07 u[IU]/mL (ref 0.35–5.50)

## 2022-05-15 NOTE — Progress Notes (Signed)
Established Patient Office Visit  Subjective   Patient ID: Daniel Everett, male    DOB: Aug 02, 1931  Age: 89 y.o. MRN: [id number]  Chief Complaint  Patient presents with   Medical Management of Chronic Issues    HPI   Daniel Everett is seen for routine medical follow-up accompanied by his daughter.  He is still living at home and has substantial help with meals,etc. Of note, he was placed on Namenda last visit and daughter thinks he is improved cognitively.  They are not sure how much of that is benefit from Namenda versus improvement after coming off Topamax.  He was taken off Topamax per neurology and they think he has had some steady improvement since then.  He certainly had some improved appetite and weight gain since then but no increased peripheral edema.  Does have history of diastolic heart failure, hypertension, atrial fibrillation, hypothyroidism, hyperlipidemia, essential tremor.  No reported recent falls.  Medications reviewed.  Needs follow-up lipids.  They have not been recently monitoring blood pressure but in the past his blood pressures been on the low side.  Slightly up today which may reflect his recent weight gain. He remains on Eliquis for his chronic atrial fibrillation.  His major complaints today include left knee pain and right wrist pain.  No injury.  Known osteoarthritis.  Taking Tylenol without much benefit.  Try topical Voltaren gel also.  Past Medical History:  Diagnosis Date   Acute encephalopathy 10/01/2021   Acute on chronic diastolic CHF (congestive heart failure)    Allergic rhinitis due to pollen 06/07/2008   Anticoagulated by anticoagulation treatment 01/08/2019   Chronic maxillary sinusitis 08/24/2008   Cortical senile cataract 05/03/2020   Dementia due to late onset Alzheimer's disease 10/31/2021   Dermatophytosis of nail 06/06/2009   Diverticulosis of colon 11/27/2006   Essential hypertension 10/28/2006   Essential tremor 06/25/2016   Gait  abnormality 03/02/2018   Generalized osteoarthrosis, involving multiple sites 01/03/2010   GERD (gastroesophageal reflux disease) 02/08/2015   History of colonic polyps 10/28/2006   Hoarseness, chronic 10/18/2008   Hydrocele    S/P repair [id number] complicated by seroma   Hyperglycemia 10/18/2008   Hyperlipidemia 10/28/2006   Hypothyroidism 11/27/2006   Leg swelling 10/02/2021   Melanoma of left side of neck 01/29/2019   Nocturia 02/08/2015   Open-angle glaucoma 05/03/2020   Peripheral neuropathy 04/08/2018   Permanent atrial fibrillation 08/20/2018   Personal history of malignant neoplasm of prostate 05/28/2007   Presence of permanent cardiac pacemaker 08/20/2018   Senile nuclear sclerosis 05/03/2020   Past Surgical History:  Procedure Laterality Date   arthroscopy rt knee     EXCISION MASS NECK Left 01/29/2019   Procedure: 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;  Surgeon: [name], MD;  Location: [facility];  Service: ENT;  Laterality: Left;   HYDROCELE EXCISION / REPAIR     INSERT / REPLACE / REMOVE PACEMAKER  08/20/2018   PACEMAKER IMPLANT N/A 08/20/2018   Procedure: PACEMAKER IMPLANT;  Surgeon: Constance Haw, MD;  Location: [facility];  Service: Cardiovascular;  Laterality: N/A;   PROSTATE SURGERY     TURP   ROTATOR CUFF REPAIR      reports that he has never smoked. He has never used smokeless tobacco. He reports that he does not drink alcohol and does not use drugs. family history includes Cancer in his father; Colon cancer in his sister; Coronary artery disease in his mother; Heart disease in  his mother; Pancreatic cancer in his father; Tremor in his sister. No Known Allergies  Review of Systems  Constitutional:  Negative for chills, fever and malaise/fatigue.  Respiratory:  Negative for shortness of breath.   Cardiovascular:  Negative for chest pain.  Musculoskeletal:  Positive for joint pain.  Neurological:  Positive for tremors. Negative  for dizziness, weakness and headaches.      Objective:     BP 138/62 (BP Location: Left Arm, Cuff Size: Normal)   Pulse 64   Ht 6' 4"$  (1.93 m)   Wt 195 lb 4.8 oz (88.6 kg)   SpO2 97%   BMI 23.77 kg/m  BP Readings from Last 3 Encounters:  05/15/22 138/62  03/29/22 132/60  02/27/22 (!) 146/91   Wt Readings from Last 3 Encounters:  05/15/22 195 lb 4.8 oz (88.6 kg)  03/29/22 192 lb (87.1 kg)  02/27/22 191 lb (86.6 kg)      Physical Exam Vitals reviewed.  Cardiovascular:     Rate and Rhythm: Normal rate.  Pulmonary:     Effort: Pulmonary effort is normal.     Breath sounds: Normal breath sounds.  Musculoskeletal:     Comments: Trace pitting edema lower legs bilaterally.    Neurological:     Mental Status: He is alert.     Cranial Nerves: No cranial nerve deficit.      No results found for any visits on 05/15/22.    The ASCVD Risk score (Arnett DK, et al., 2019) failed to calculate for the following reasons:   The 2019 ASCVD risk score is only valid for ages 15 to 98    Assessment & Plan:   #1 hypothyroidism.  Patient on levothyroxine.  Recheck TSH  #2 hyperlipidemia treated with Lipitor.  Check lipid and CMP.  #3 hypertension.  Generally well-controlled.  Improved some after rest today.  Continue lisinopril 20 mg daily.  Check renal function and electrolytes  #4 chronic atrial fibrillation on Eliquis.  Rechecking renal function today along with CBC.  #5 osteoarthritis involving multiple joints.  Suspect he has some osteoarthritis right wrist and left knee.  Continue Tylenol.  Consider short-term use of wrist brace for the right wrist.  Continue Voltaren gel.  He has had knee injections in the past and will consider follow-up with sports medicine if knee pain persist   Return in about 6 months (around 11/13/2022).    Carolann Littler, MD

## 2022-05-16 LAB — COMPREHENSIVE METABOLIC PANEL
ALT: 17 U/L (ref 0–53)
AST: 26 U/L (ref 0–37)
Albumin: 4.2 g/dL (ref 3.5–5.2)
Alkaline Phosphatase: 55 U/L (ref 39–117)
BUN: 25 mg/dL — ABNORMAL HIGH (ref 6–23)
CO2: 24 mEq/L (ref 19–32)
Calcium: 9.8 mg/dL (ref 8.4–10.5)
Chloride: 107 mEq/L (ref 96–112)
Creatinine, Ser: 1.3 mg/dL (ref 0.40–1.50)
GFR: 48.99 mL/min — ABNORMAL LOW (ref >60.00)
Glucose, Bld: 114 mg/dL — ABNORMAL HIGH (ref 70–99)
Potassium: 4.3 mEq/L (ref 3.5–5.1)
Sodium: 144 mEq/L (ref 135–145)
Total Bilirubin: 1 mg/dL (ref 0.2–1.2)
Total Protein: 7.1 g/dL (ref 6.0–8.3)

## 2022-05-16 LAB — LDL CHOLESTEROL, DIRECT: Direct LDL: 93 mg/dL

## 2022-05-16 LAB — LIPID PANEL
Cholesterol: 156 mg/dL (ref 0–200)
HDL: 37.2 mg/dL — ABNORMAL LOW (ref >39.00)
NonHDL: 119.19
Total CHOL/HDL Ratio: 4
Triglycerides: 348 mg/dL — ABNORMAL HIGH (ref 0.0–149.0)
VLDL: 69.6 mg/dL — ABNORMAL HIGH (ref 0.0–40.0)

## 2022-06-03 ENCOUNTER — Other Ambulatory Visit: Payer: Self-pay | Admitting: Cardiology

## 2022-06-18 ENCOUNTER — Ambulatory Visit (INDEPENDENT_AMBULATORY_CARE_PROVIDER_SITE_OTHER): Payer: Medicare HMO

## 2022-06-18 DIAGNOSIS — Z950 Presence of cardiac pacemaker: Secondary | ICD-10-CM

## 2022-06-18 LAB — CUP PACEART REMOTE DEVICE CHECK
Battery Remaining Longevity: 100 mo
Battery Remaining Percentage: 74 %
Battery Voltage: 3.01 V
Brady Statistic RV Percent Paced: 94 %
Date Time Interrogation Session: 20240326020022
Implantable Lead Connection Status: 753985
Implantable Lead Implant Date: 20200528
Implantable Lead Location: 753860
Implantable Pulse Generator Implant Date: 20200528
Lead Channel Impedance Value: 510 Ohm
Lead Channel Pacing Threshold Amplitude: 0.625 V
Lead Channel Pacing Threshold Pulse Width: 0.5 ms
Lead Channel Sensing Intrinsic Amplitude: 4.5 mV
Lead Channel Setting Pacing Amplitude: 0.875
Lead Channel Setting Pacing Pulse Width: 0.5 ms
Lead Channel Setting Sensing Sensitivity: 2 mV
Pulse Gen Model: 1272
Pulse Gen Serial Number: 9116714

## 2022-06-29 NOTE — Progress Notes (Unsigned)
Cardiology Office Note   Date:  06/29/2022   ID:  Daniel DubonnetWilliam E Arnall, DOB 04/12/1931, MRN [id number]  PCP:  Kristian CoveyBurchette, Bruce W, MD  Cardiologist:  Dr [place]il[place]Jordan  Alston Berrie [place], MD 03/27/2016   History of Present Illness: Daniel Everett is a 89 y.o. male with a history of hypothyroid, hyperglycemia, prostate CA s/p TURP, OA, colon polyps, HTN, RBBB- seen for follow up of Atrial fibrillation.  He was initially seen by Theodore Demarkhonda Barrett PA-C in January 2018 for atrial fibrillation with  HR 50s at baseline, on Xarelto. On no AV nodal blocking agents. Holter ordered. BP up, HCTZ added. BMET 1 week later was ok. Holter w/ no pauses > 3.2 sec, HR generally slow, 52 avg, lowest 21 at 5 am. Echo showed normal EF. Mild MR, mod TR. Severe biatrial enlargement.   He has been seen by Dr. Anne HahnWillis in January for evaluation of tremor, gait instability.  EMG c/w peripheral neuropathy. Other lab studies normal. Was noted to have marked bradycardia with Afib and he subsequently had a [employer] single-chamber pacemaker implanted 08/20/2018. Followed by Dr Elberta Fortisamnitz. Last check in September 2021 was satisfactory.   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   On follow up today he is feeling well. He does still have balance problems. Is not having any chest  pain or dyspnea. Mild ankle swelling. Has lost about 15 lbs. No dizziness. Has bowel irregularity.     Past Medical History:  Diagnosis Date   Acute encephalopathy 10/01/2021   Acute on chronic diastolic CHF (congestive heart failure)    Allergic rhinitis due to pollen 06/07/2008   Anticoagulated by anticoagulation treatment 01/08/2019   Chronic maxillary sinusitis 08/24/2008   Cortical senile cataract 05/03/2020   Dementia due to late onset Alzheimer's disease 10/31/2021   Dermatophytosis of nail 06/06/2009   Diverticulosis of colon 11/27/2006   Essential hypertension 10/28/2006   Essential tremor 06/25/2016   Gait abnormality 03/02/2018    Generalized osteoarthrosis, involving multiple sites 01/03/2010   GERD (gastroesophageal reflux disease) 02/08/2015   History of colonic polyps 10/28/2006   Hoarseness, chronic 10/18/2008   Hydrocele    S/P repair 08/2001 complicated by seroma   Hyperglycemia 10/18/2008   Hyperlipidemia 10/28/2006   Hypothyroidism 11/27/2006   Leg swelling 10/02/2021   Melanoma of left side of neck 01/29/2019   Nocturia 02/08/2015   Open-angle glaucoma 05/03/2020   Peripheral neuropathy 04/08/2018   Permanent atrial fibrillation 08/20/2018   Personal history of malignant neoplasm of prostate 05/28/2007   Presence of permanent cardiac pacemaker 08/20/2018   Senile nuclear sclerosis 05/03/2020    Past Surgical History:  Procedure Laterality Date   arthroscopy rt knee     EXCISION MASS NECK Left 01/29/2019   Procedure: 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;  Surgeon: Serena Colonelosen, Jefry, MD;  Location: [facility];  Service: ENT;  Laterality: Left;   HYDROCELE EXCISION / REPAIR     INSERT / REPLACE / REMOVE PACEMAKER  08/20/2018   PACEMAKER IMPLANT N/A 08/20/2018   Procedure: PACEMAKER IMPLANT;  Surgeon: Regan Lemmingamnitz, Will Martin, MD;  Location: [facility];  Service: Cardiovascular;  Laterality: N/A;   PROSTATE SURGERY     TURP   ROTATOR CUFF REPAIR      Current Outpatient Medications  Medication Sig Dispense Refill   acetaminophen (TYLENOL) 650 MG CR tablet Take 650 mg by mouth 2 (two) times a day.     apixaban (ELIQUIS) 5  MG TABS tablet Take 1 tablet (5 mg total) by mouth 2 (two) times daily. 180 tablet 3   atorvastatin (LIPITOR) 20 MG tablet Take 1 tablet (20 mg total) by mouth daily. NEED OV. 90 tablet 0   Cholecalciferol (VITAMIN D3) 50 MCG (2000 UT) TABS Take 2,000 Units by mouth daily.     finasteride (PROSCAR) 5 MG tablet TAKE 1 TABLET DAILY 90 tablet 1   furosemide (LASIX) 20 MG tablet TAKE 1 TABLET BY MOUTH EVERY DAY 90 tablet 1   levothyroxine (SYNTHROID) 75 MCG tablet Take 1  tablet (75 mcg total) by mouth daily. 90 tablet 3   lisinopril (ZESTRIL) 20 MG tablet TAKE 1 TABLET DAILY (Patient taking differently: Take 20 mg by mouth daily.) 90 tablet 3   memantine (NAMENDA) 10 MG tablet Take 1 tablet (10 mg total) by mouth 2 (two) times daily. 180 tablet 3   omeprazole (PRILOSEC) 20 MG capsule Take 1 capsule (20 mg total) by mouth daily. 90 capsule 3   pyridOXINE (VITAMIN B-6) 100 MG tablet Take 100 mg by mouth daily.     timolol (TIMOPTIC) 0.5 % ophthalmic solution Place 1 drop into the left eye 2 (two) times daily.     triamcinolone cream (KENALOG) 0.1 % Apply 1 Application topically 2 (two) times daily. (Patient taking differently: Apply 1 Application topically 2 (two) times daily. Apply to sores on both legs) 45 g 1   No current facility-administered medications for this visit.    Allergies:   Patient has no known allergies.    Social History:  The patient  reports that he has never smoked. He has never used smokeless tobacco. He reports that he does not drink alcohol and does not use drugs.   Family History:  The patient's family history includes Cancer in his father; Colon cancer in his sister; Coronary artery disease in his mother; Heart disease in his mother; Pancreatic cancer in his father; Tremor in his sister.    ROS:  Please see the history of present illness. All other systems are reviewed and negative.    PHYSICAL EXAM: VS:  There were no vitals taken for this visit. , BMI There is no height or weight on file to calculate BMI. GENERAL:  Well appearing, elderly WM in NAD HEENT:  PERRL, EOMI, sclera are clear. Oropharynx is clear. NECK:  No jugular venous distention, carotid upstroke brisk and symmetric, no bruits, no thyromegaly or adenopathy LUNGS:  Clear to auscultation bilaterally CHEST:  Unremarkable HEART:  RRR,    PMI not displaced or sustained,S1 and S2 within normal limits, no S3, no S4: no clicks, no rubs, no murmurs ABD:  Soft, nontender.  BS +, no masses or bruits. No hepatomegaly, no splenomegaly EXT:  2 + pulses throughout, no edema, no cyanosis no clubbing SKIN:  Warm and dry.  No rashes NEURO:  Alert and oriented x 3. Cranial nerves II through XII intact. PSYCH:  Cognitively intact   Recent Labs: 10/01/2021: B Natriuretic Peptide 257.2 05/15/2022: ALT 17; BUN 25; Creatinine, Ser 1.30; Hemoglobin 15.6; Platelets 188.0; Potassium 4.3; Sodium 144; TSH 5.07    Lipid Panel    Component Value Date/Time   CHOL 156 05/15/2022 1358   TRIG 348.0 (H) 05/15/2022 1358   HDL 37.20 (L) 05/15/2022 1358   CHOLHDL 4 05/15/2022 1358   VLDL 69.6 (H) 05/15/2022 1358   LDLCALC 77 05/15/2021 1319   LDLDIRECT 93.0 05/15/2022 1358     Wt Readings from Last 3 Encounters:  05/15/22 195 lb 4.8 oz (88.6 kg)  03/29/22 192 lb (87.1 kg)  02/27/22 191 lb (86.6 kg)     Other studies Reviewed: Additional studies/ records that were reviewed today include: office notes and testing.  Ecg today shows Afib with ventricularly paced rhythm rate 61. I have personally reviewed and interpreted this study.   Holter 04/04/16: Study Highlights   Atrial fibrillation with slow ventricular response Rare PVCs and PVC couplets Longest pause 3.2 seconds    Echo 04/08/16: Study Conclusions   - Left ventricle: The cavity size was normal. Wall thickness was   normal. Systolic function was normal. The estimated ejection   fraction was in the range of 55% to 60%. Wall motion was normal;   there were no regional wall motion abnormalities. The study is   not technically sufficient to allow evaluation of LV diastolic   function. - Aortic valve: There was trivial regurgitation. - Mitral valve: There was mild regurgitation. - Left atrium: The atrium was severely dilated. - Right atrium: The atrium was severely dilated. - Atrial septum: No defect or patent foramen ovale was identified. - Tricuspid valve: There was moderate regurgitation.  Echo 10/03/21:  IMPRESSIONS     1. Left ventricular ejection fraction, by estimation, is 60 to 65%. The  left ventricle has normal function. The left ventricle has no regional  wall motion abnormalities. Left ventricular diastolic function could not  be evaluated.   2. Right ventricular systolic function is normal. The right ventricular  size is moderately enlarged. There is mildly elevated pulmonary artery  systolic pressure. The estimated right ventricular systolic pressure is  38.2 mmHg.   3. Left atrial size was severely dilated.   4. Right atrial size was severely dilated.   5. The mitral valve is normal in structure. Trivial mitral valve  regurgitation. No evidence of mitral stenosis.   6. The aortic valve is tricuspid. Aortic valve regurgitation is mild.  Aortic valve sclerosis/calcification is present, without any evidence of  aortic stenosis. Aortic regurgitation PHT measures 699 msec. Aortic valve  area, by VTI measures 2.87 cm.  Aortic valve mean gradient measures 3.0 mmHg. Aortic valve Vmax measures  1.18 m/s.   7. Aortic dilatation noted. There is mild dilatation of the aortic root,  measuring 42 mm.   8. The inferior vena cava is dilated in size with <50% respiratory  variability, suggesting right atrial pressure of 15 mmHg.   ASSESSMENT AND PLAN:  1.  Atrial fib-  chronic with slow ventricular response.  S/p PPM placement in May 2020. Clinically doing well. Continue Eliquis. On no rate slowing meds.   2. Chronic anticoag: CHA2DS2VASc=3 (age x 2, HTN). Continue Eliquis.   3. HTN- BP is way too low. Recommend stopping amlodipine and HCTZ. Monitor.   4. Peripheral Neuropathy. Followed by Dr. Anne Hahn.   5. Bowel irregularity. Recommend a trial of daily psyllium.  Current medicines are reviewed at length with the patient today.  The patient has concerns regarding medicines. Concerns were addressed.  The following changes have been made:  no change  Labs/ tests ordered today  include:   No orders of the defined types were placed in this encounter.  Follow up in one year  Signed, Reta Norgren [place], MD  06/29/2022 7:45 AM    [facility]

## 2022-06-30 ENCOUNTER — Other Ambulatory Visit: Payer: Self-pay | Admitting: Cardiology

## 2022-07-03 ENCOUNTER — Ambulatory Visit: Payer: Medicare HMO | Attending: Cardiology | Admitting: Cardiology

## 2022-07-03 ENCOUNTER — Encounter: Payer: Self-pay | Admitting: Cardiology

## 2022-07-03 VITALS — BP 138/98 | HR 63 | Ht 76.0 in | Wt 194.2 lb

## 2022-07-03 DIAGNOSIS — I10 Essential (primary) hypertension: Secondary | ICD-10-CM | POA: Diagnosis not present

## 2022-07-03 DIAGNOSIS — Z950 Presence of cardiac pacemaker: Secondary | ICD-10-CM | POA: Diagnosis not present

## 2022-07-03 DIAGNOSIS — I4821 Permanent atrial fibrillation: Secondary | ICD-10-CM

## 2022-07-03 MED ORDER — ATORVASTATIN CALCIUM 20 MG PO TABS: 20.0000 mg | ORAL_TABLET | Freq: Every day | ORAL | 3 refills | Status: DC

## 2022-07-03 NOTE — Patient Instructions (Signed)
Medication Instructions:  Continue all medications  *If you need a refill on your cardiac medications before your next appointment, please call your pharmacy*   Lab Work: None ordered   Testing/Procedures: None ordered    Follow-Up: At  [facility], you and your health needs are our priority.  As part of our continuing mission to provide you with exceptional heart care, we have created designated Provider Care Teams.  These Care Teams include your primary Cardiologist (physician) and Advanced Practice Providers (APPs -  Physician Assistants and Nurse Practitioners) who all work together to provide you with the care you need, when you need it.  We recommend signing up for the patient portal called "MyChart".  Sign up information is provided on this After Visit Summary.  MyChart is used to connect with patients for Virtual Visits (Telemedicine).  Patients are able to view lab/test results, encounter notes, upcoming appointments, etc.  Non-urgent messages can be sent to your provider as well.   To learn more about what you can do with MyChart, go to [email or web address].    Your next appointment:  1 year    Call in Jan to schedule April appointment     Provider:  Dr.Jordan   

## 2022-07-10 DIAGNOSIS — R32 Unspecified urinary incontinence: Secondary | ICD-10-CM | POA: Diagnosis not present

## 2022-07-10 DIAGNOSIS — I4891 Unspecified atrial fibrillation: Secondary | ICD-10-CM | POA: Diagnosis not present

## 2022-07-10 DIAGNOSIS — G309 Alzheimer's disease, unspecified: Secondary | ICD-10-CM | POA: Diagnosis not present

## 2022-07-10 DIAGNOSIS — M1990 Unspecified osteoarthritis, unspecified site: Secondary | ICD-10-CM | POA: Diagnosis not present

## 2022-07-10 DIAGNOSIS — R2681 Unsteadiness on feet: Secondary | ICD-10-CM | POA: Diagnosis not present

## 2022-07-10 DIAGNOSIS — N529 Male erectile dysfunction, unspecified: Secondary | ICD-10-CM | POA: Diagnosis not present

## 2022-07-10 DIAGNOSIS — I509 Heart failure, unspecified: Secondary | ICD-10-CM | POA: Diagnosis not present

## 2022-07-10 DIAGNOSIS — N400 Enlarged prostate without lower urinary tract symptoms: Secondary | ICD-10-CM | POA: Diagnosis not present

## 2022-07-10 DIAGNOSIS — K219 Gastro-esophageal reflux disease without esophagitis: Secondary | ICD-10-CM | POA: Diagnosis not present

## 2022-07-10 DIAGNOSIS — E039 Hypothyroidism, unspecified: Secondary | ICD-10-CM | POA: Diagnosis not present

## 2022-07-10 DIAGNOSIS — E785 Hyperlipidemia, unspecified: Secondary | ICD-10-CM | POA: Diagnosis not present

## 2022-07-10 DIAGNOSIS — Z008 Encounter for other general examination: Secondary | ICD-10-CM | POA: Diagnosis not present

## 2022-07-10 DIAGNOSIS — D6869 Other thrombophilia: Secondary | ICD-10-CM | POA: Diagnosis not present

## 2022-07-30 NOTE — Progress Notes (Signed)
Remote pacemaker transmission.   

## 2022-08-03 ENCOUNTER — Other Ambulatory Visit: Payer: Self-pay | Admitting: Cardiology

## 2022-08-14 IMAGING — DX DG HAND COMPLETE 3+V*R*
3 series · 3 of 3 positions shown · non-contrast
Comparison: None.

CLINICAL DATA: Hand pain.  Multiple falls.

EXAM:
RIGHT HAND - COMPLETE 3+ VIEW

[hand ap]
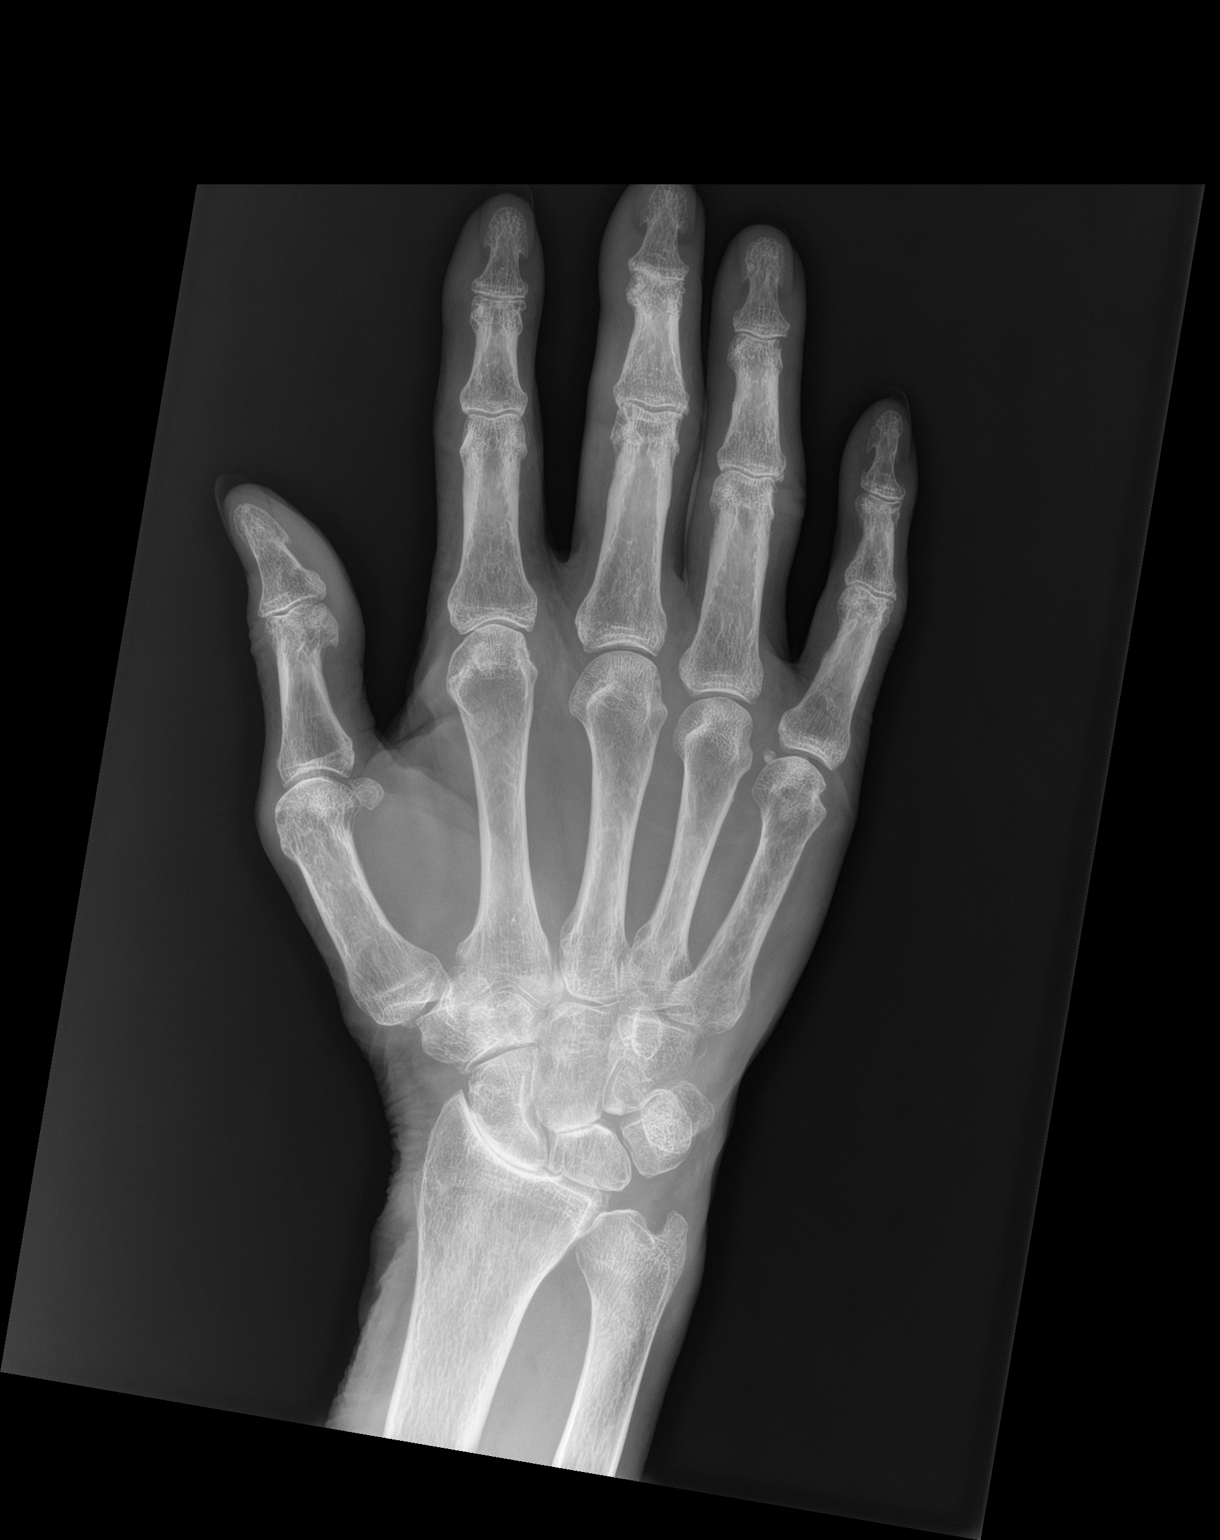

[hand obl]
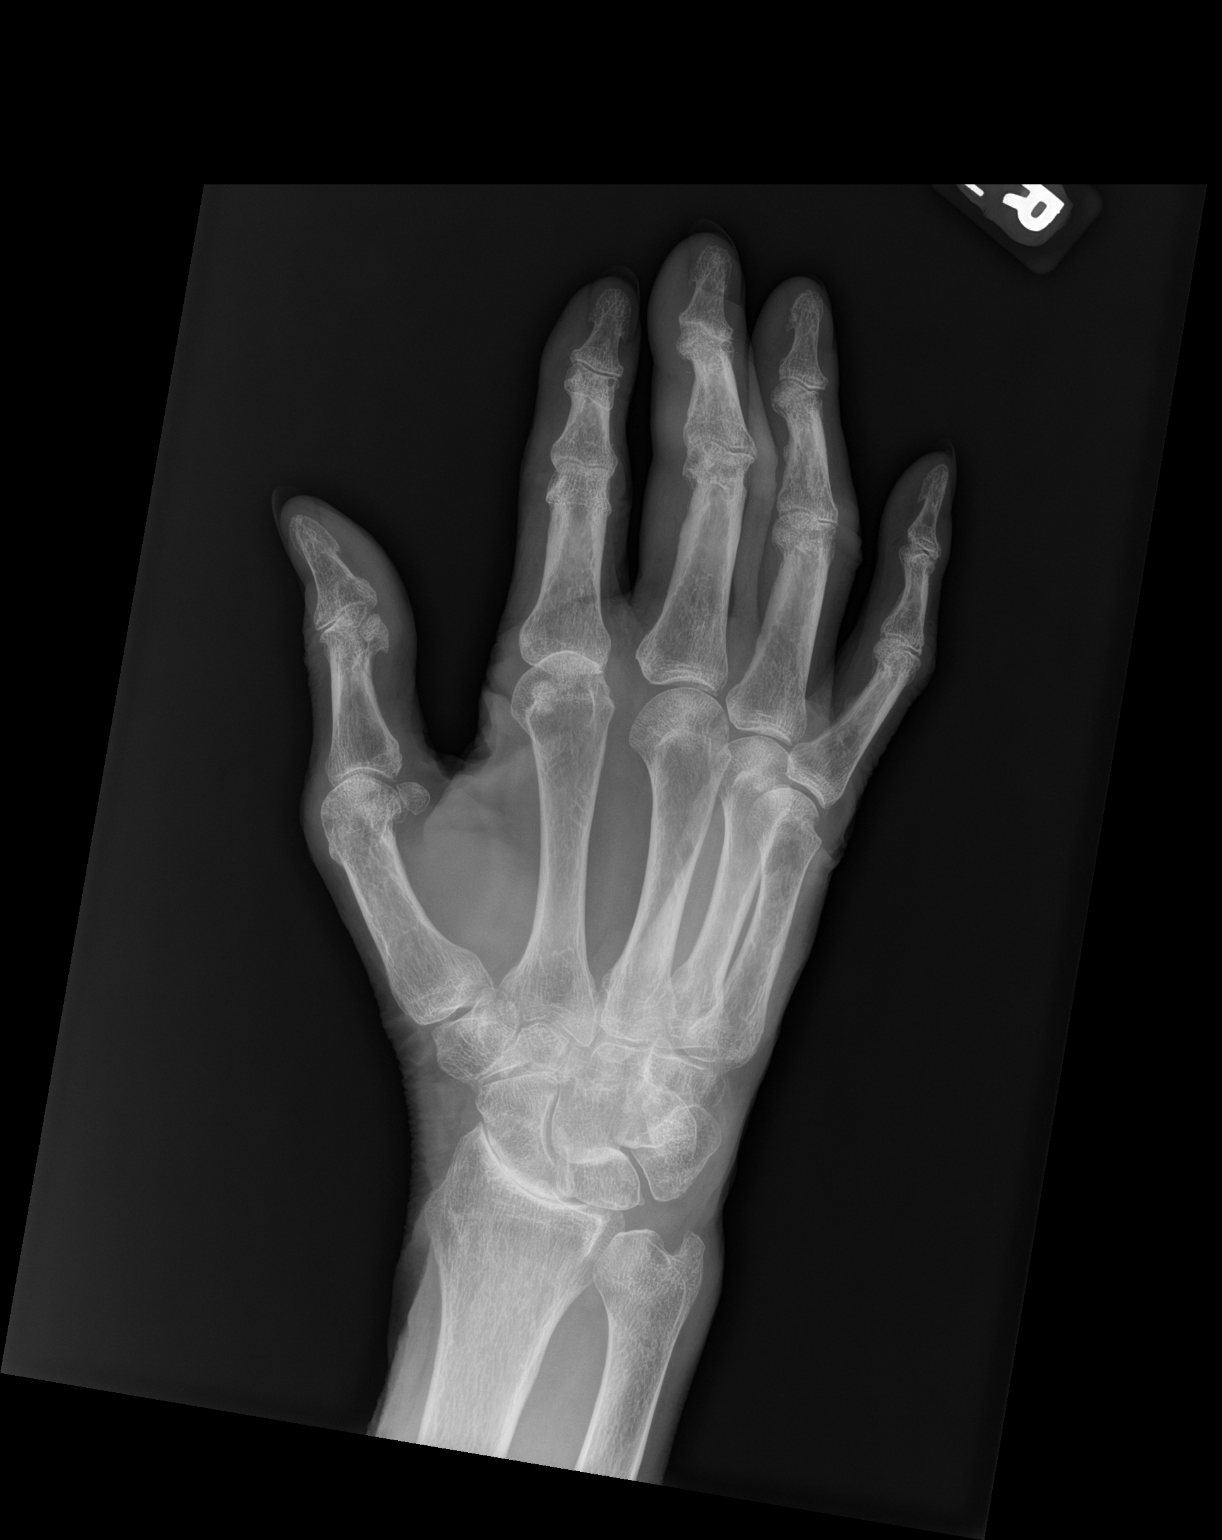

[hand lat]
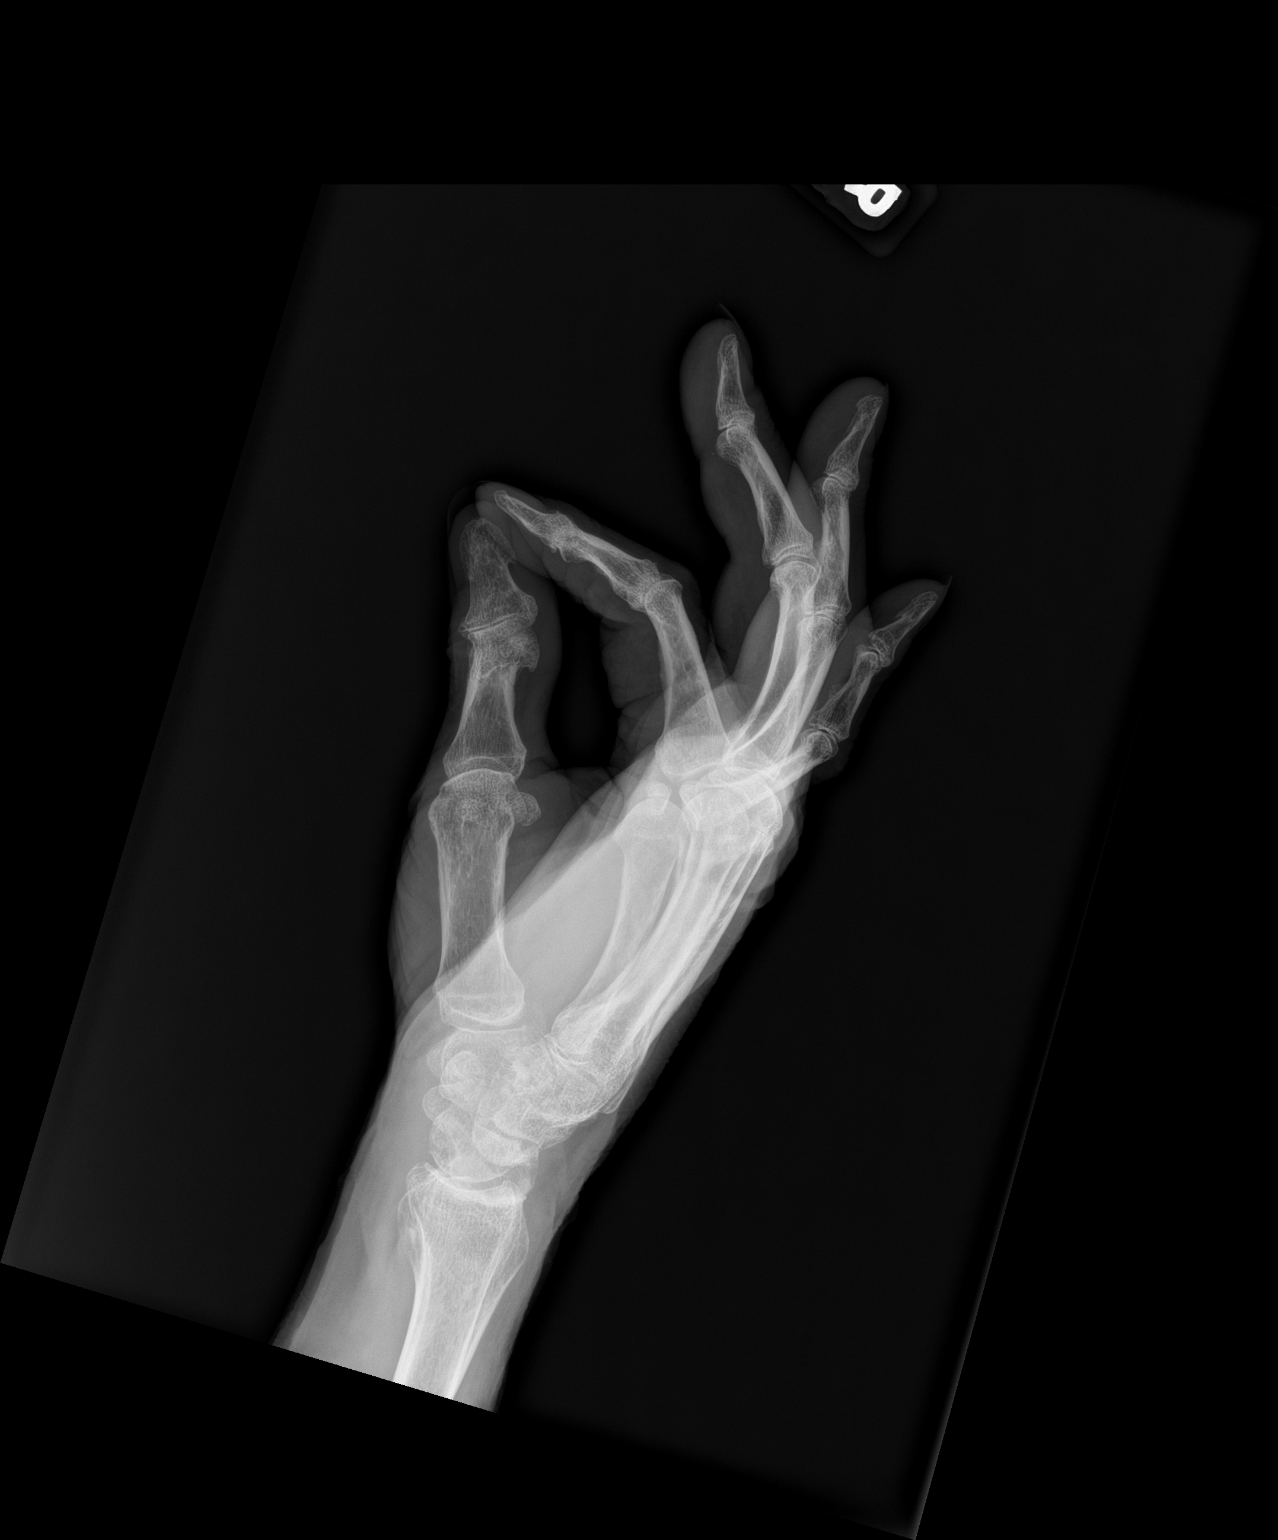

[3 of 3 positions shown; findings below may reference images not displayed]

FINDINGS: Mild diffuse osteopenia. No acute fracture or dislocation
identified. Mild joint space narrowing with marginal spur formation
involving the D IP joints. Narrowing of the radiocarpal joint is
identified with subchondral sclerosis. Soft tissues are
unremarkable.
IMPRESSION: 1. No acute findings.
2. Osteopenia.
3. Osteoarthritis.

## 2022-08-14 IMAGING — DX DG HAND COMPLETE 3+V*L*
3 series · 3 of 3 positions shown · non-contrast
Comparison: None.

CLINICAL DATA: Bilateral hand pain, for 3 months, history of
multiple falls, no acute injury reported

EXAM:
LEFT HAND - COMPLETE 3+ VIEW

[hand ap]
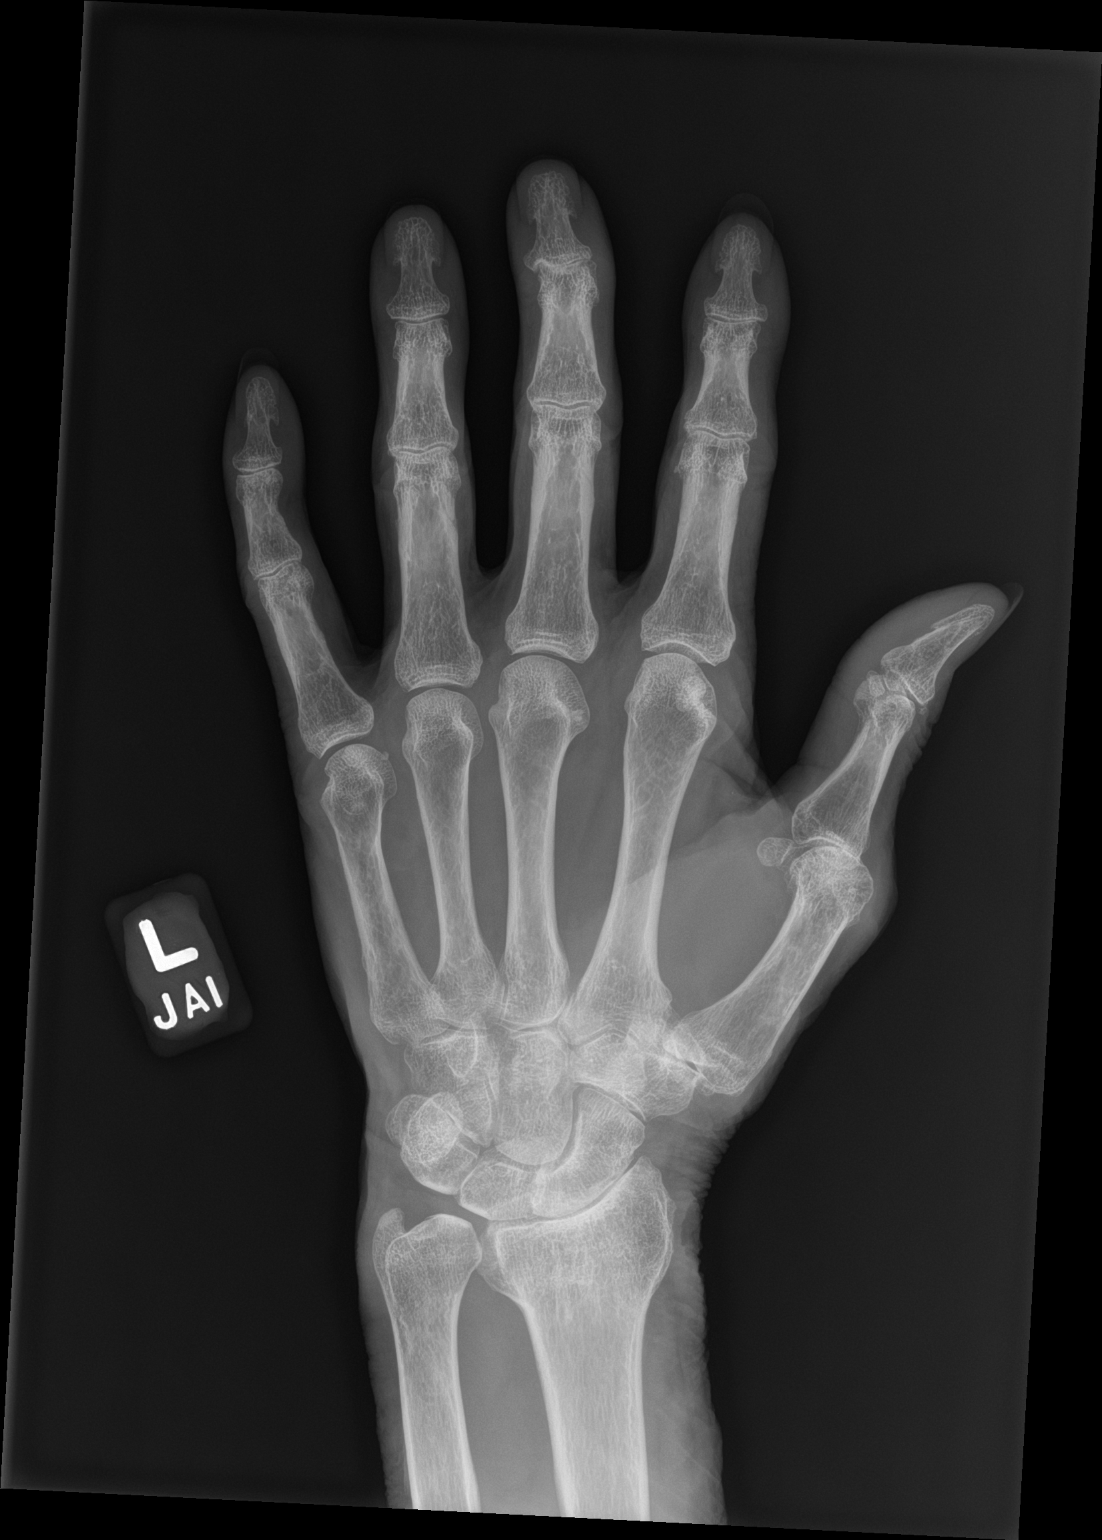

[hand obl]
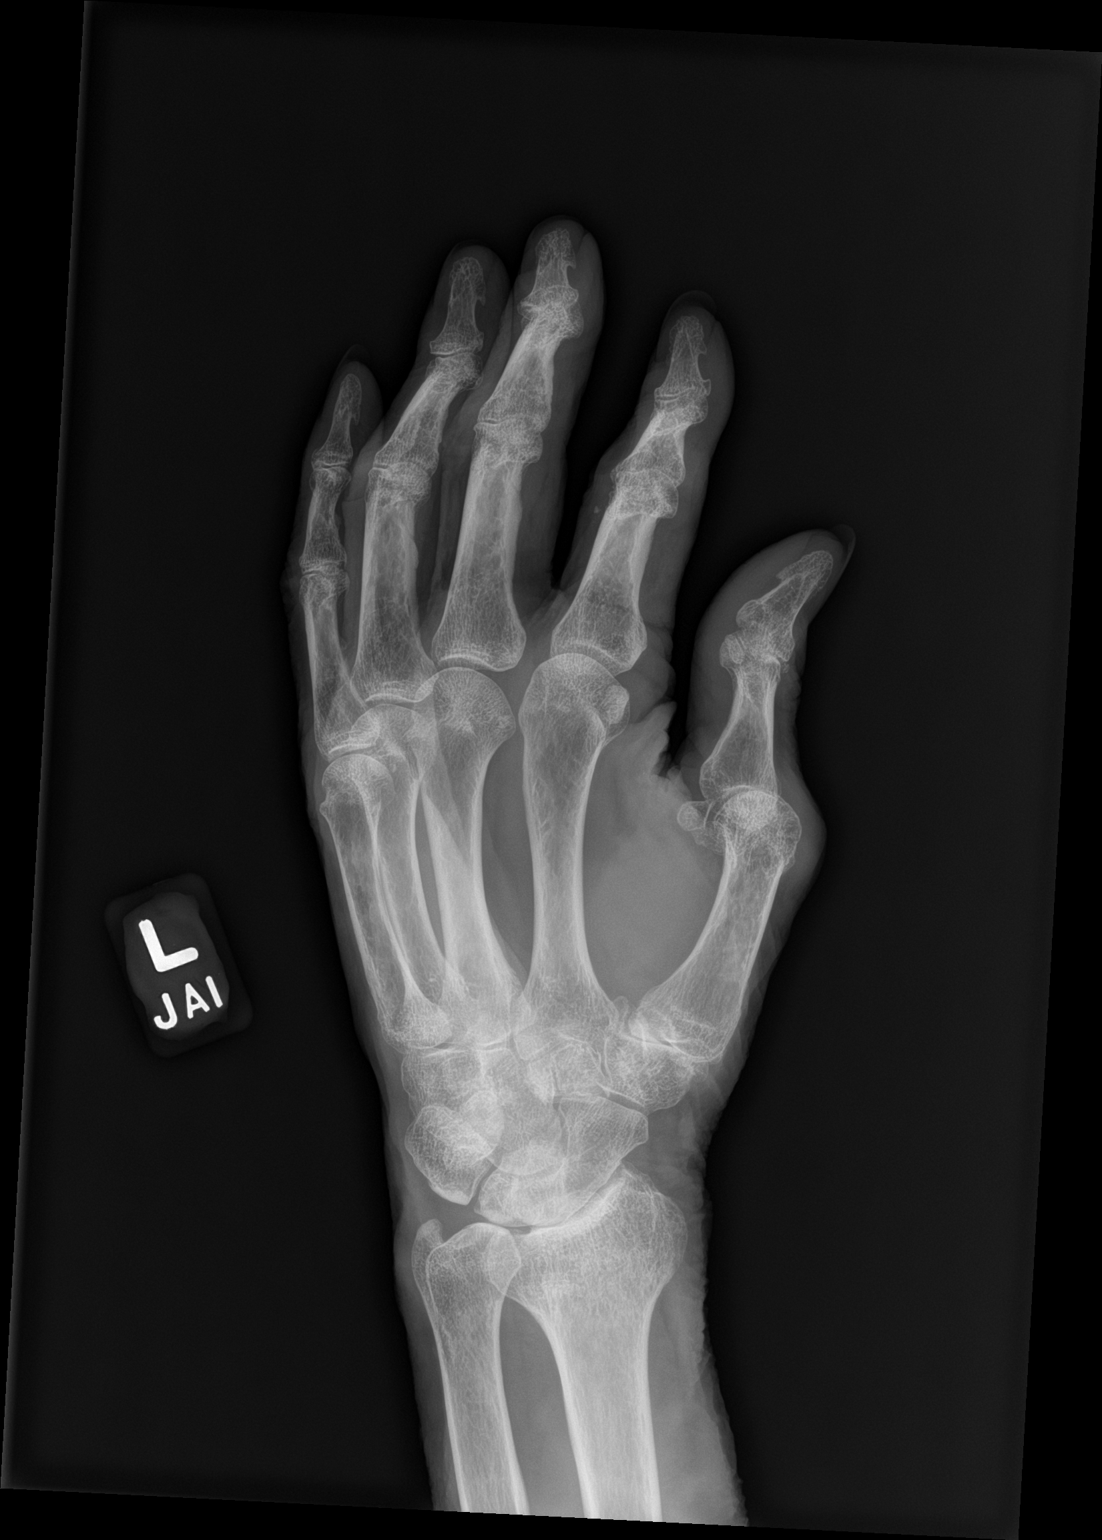

[hand lat]
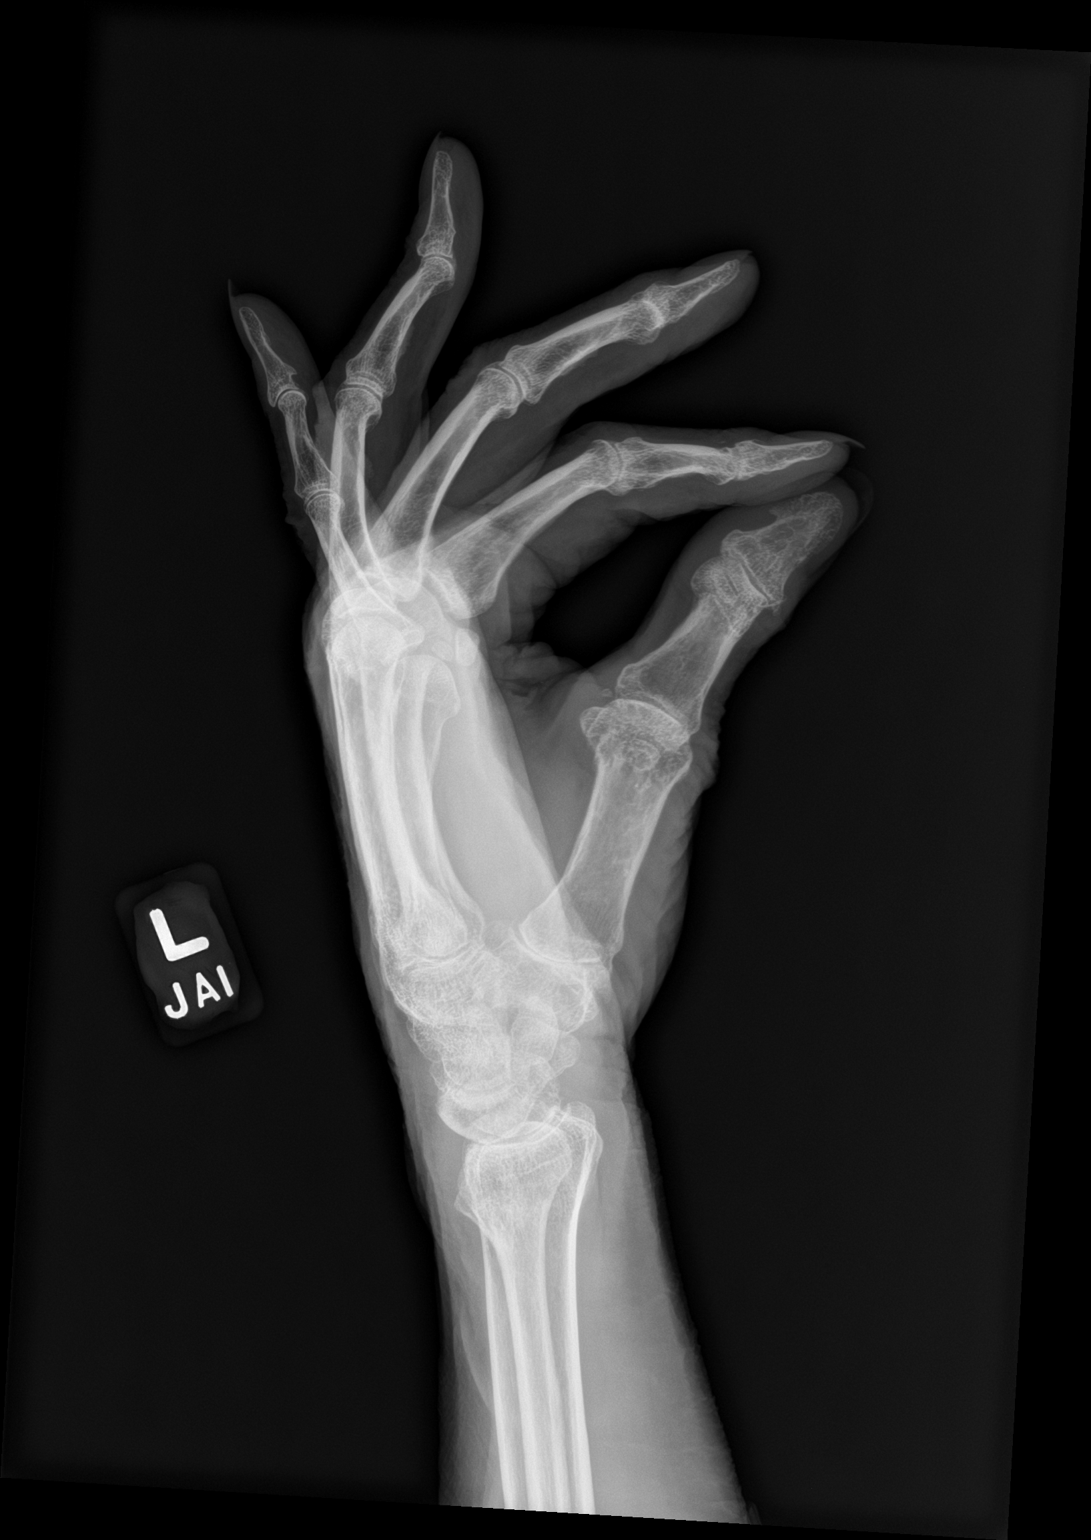

[3 of 3 positions shown; findings below may reference images not displayed]

FINDINGS: No fracture or dislocation. Mild first carpometacarpal joint
osteoarthritis. Mild first MCP joint osteoarthritis. Mild third D IP
joint osteoarthritis. No focal osseous lesions. No radiopaque
foreign bodies.
IMPRESSION: Mild polyarticular osteoarthritis, most prominent at the first
carpometacarpal joint.

## 2022-08-28 DIAGNOSIS — L821 Other seborrheic keratosis: Secondary | ICD-10-CM | POA: Diagnosis not present

## 2022-08-28 DIAGNOSIS — L814 Other melanin hyperpigmentation: Secondary | ICD-10-CM | POA: Diagnosis not present

## 2022-08-28 DIAGNOSIS — D492 Neoplasm of unspecified behavior of bone, soft tissue, and skin: Secondary | ICD-10-CM | POA: Diagnosis not present

## 2022-08-28 DIAGNOSIS — Z08 Encounter for follow-up examination after completed treatment for malignant neoplasm: Secondary | ICD-10-CM | POA: Diagnosis not present

## 2022-08-28 DIAGNOSIS — L570 Actinic keratosis: Secondary | ICD-10-CM | POA: Diagnosis not present

## 2022-08-28 DIAGNOSIS — Z85820 Personal history of malignant melanoma of skin: Secondary | ICD-10-CM | POA: Diagnosis not present

## 2022-08-28 DIAGNOSIS — D225 Melanocytic nevi of trunk: Secondary | ICD-10-CM | POA: Diagnosis not present

## 2022-09-01 ENCOUNTER — Other Ambulatory Visit: Payer: Self-pay | Admitting: Family Medicine

## 2022-09-04 DIAGNOSIS — H401132 Primary open-angle glaucoma, bilateral, moderate stage: Secondary | ICD-10-CM | POA: Diagnosis not present

## 2022-09-17 ENCOUNTER — Ambulatory Visit (INDEPENDENT_AMBULATORY_CARE_PROVIDER_SITE_OTHER): Payer: Medicare HMO

## 2022-09-17 DIAGNOSIS — I5033 Acute on chronic diastolic (congestive) heart failure: Secondary | ICD-10-CM | POA: Diagnosis not present

## 2022-09-18 LAB — CUP PACEART REMOTE DEVICE CHECK
Battery Remaining Longevity: 101 mo
Battery Remaining Percentage: 72 %
Battery Voltage: 3.01 V
Brady Statistic RV Percent Paced: 95 %
Date Time Interrogation Session: 20240625020021
Implantable Lead Connection Status: 753985
Implantable Lead Implant Date: 20200528
Implantable Lead Location: 753860
Implantable Pulse Generator Implant Date: 20200528
Lead Channel Impedance Value: 530 Ohm
Lead Channel Pacing Threshold Amplitude: 0.5 V
Lead Channel Pacing Threshold Pulse Width: 0.5 ms
Lead Channel Sensing Intrinsic Amplitude: 5.8 mV
Lead Channel Setting Pacing Amplitude: 0.75 V
Lead Channel Setting Pacing Pulse Width: 0.5 ms
Lead Channel Setting Sensing Sensitivity: 2 mV
Pulse Gen Model: 1272
Pulse Gen Serial Number: 9116714

## 2022-09-21 ENCOUNTER — Other Ambulatory Visit: Payer: Self-pay | Admitting: Family Medicine

## 2022-10-11 NOTE — Progress Notes (Signed)
Remote pacemaker transmission.   

## 2022-10-23 ENCOUNTER — Encounter (INDEPENDENT_AMBULATORY_CARE_PROVIDER_SITE_OTHER): Payer: Self-pay

## 2022-11-13 ENCOUNTER — Ambulatory Visit (INDEPENDENT_AMBULATORY_CARE_PROVIDER_SITE_OTHER): Payer: Medicare HMO | Admitting: Family Medicine

## 2022-11-13 ENCOUNTER — Encounter: Payer: Self-pay | Admitting: Family Medicine

## 2022-11-13 VITALS — BP 122/78 | HR 65 | Ht 76.0 in | Wt 192.0 lb

## 2022-11-13 DIAGNOSIS — E7800 Pure hypercholesterolemia, unspecified: Secondary | ICD-10-CM | POA: Diagnosis not present

## 2022-11-13 DIAGNOSIS — E038 Other specified hypothyroidism: Secondary | ICD-10-CM | POA: Diagnosis not present

## 2022-11-13 DIAGNOSIS — F0280 Dementia in other diseases classified elsewhere without behavioral disturbance: Secondary | ICD-10-CM | POA: Diagnosis not present

## 2022-11-13 DIAGNOSIS — G309 Alzheimer's disease, unspecified: Secondary | ICD-10-CM | POA: Diagnosis not present

## 2022-11-13 DIAGNOSIS — I4821 Permanent atrial fibrillation: Secondary | ICD-10-CM

## 2022-11-13 DIAGNOSIS — I10 Essential (primary) hypertension: Secondary | ICD-10-CM | POA: Diagnosis not present

## 2022-11-13 NOTE — Progress Notes (Unsigned)
Established Patient Office Visit  Subjective   Patient ID: Daniel Everett, male    DOB: 12-23-1931  Age: 89 y.o. MRN: [id number]  Chief Complaint  Patient presents with   Medical Management of Chronic Issues    HPI  {History (Optional):23778} Daniel Everett is here accompanied by daughter.  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  Peripheral neuropathy, essential tremor.  Living at home with excellent care from family.  Is getting up some at around 3 to 4 AM and not going back to sleep.  No agitation.  No recent falls.  Ambulating with a walker.  Eating okay.  Weight down just couple pounds from last visit.  Medications reviewed and include furosemide, levothyroxine, Namenda, finasteride, lisinopril, atorvastatin, omeprazole.  He takes Tylenol for arthritis currently just 650 mg twice daily.  Complains of multiple joint pains daily.  We did explain that they could increase his Tylenol up to 1 every 6-8 hours  He had labs back in February which were stable.  Past Medical History:  Diagnosis Date   Acute encephalopathy 10/01/2021   Acute on chronic diastolic CHF (congestive heart failure)    Allergic rhinitis due to pollen 06/07/2008   Anticoagulated by anticoagulation treatment 01/08/2019   Chronic maxillary sinusitis 08/24/2008   Cortical senile cataract 05/03/2020   Dementia due to late onset Alzheimer's disease 10/31/2021   Dermatophytosis of nail 06/06/2009   Diverticulosis of colon 11/27/2006   Essential hypertension 10/28/2006   Essential tremor 06/25/2016   Gait abnormality 03/02/2018   Generalized osteoarthrosis, involving multiple sites 01/03/2010   GERD (gastroesophageal reflux disease) 02/08/2015   History of colonic polyps 10/28/2006   Hoarseness, chronic 10/18/2008   Hydrocele    S/P repair 08/2001 complicated by seroma    Hyperglycemia 10/18/2008   Hyperlipidemia 10/28/2006   Hypothyroidism 11/27/2006   Leg swelling 10/02/2021   Melanoma of left side of neck 01/29/2019   Nocturia 02/08/2015   Open-angle glaucoma 05/03/2020   Peripheral neuropathy 04/08/2018   Permanent atrial fibrillation 08/20/2018   Personal history of malignant neoplasm of prostate 05/28/2007   Presence of permanent cardiac pacemaker 08/20/2018   Senile nuclear sclerosis 05/03/2020   Past Surgical History:  Procedure Laterality Date   arthroscopy rt knee     EXCISION MASS NECK Left 01/29/2019   Procedure: 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;  Surgeon: Serena Colonel, MD;  Location: [facility] OR;  Service: ENT;  Laterality: Left;   HYDROCELE EXCISION / REPAIR     INSERT / REPLACE / REMOVE PACEMAKER  08/20/2018   PACEMAKER IMPLANT N/A 08/20/2018   Procedure: PACEMAKER IMPLANT;  Surgeon: Regan Lemming, MD;  Location: [facility];  Service: Cardiovascular;  Laterality: N/A;   PROSTATE SURGERY     TURP   ROTATOR CUFF REPAIR      reports that he has never smoked. He has never used smokeless tobacco. He reports that he does not drink alcohol and does not use drugs. family history includes Cancer in his father; Colon cancer in his sister; Coronary artery disease in his mother; Heart disease in his mother; Pancreatic cancer in his father; Tremor in his sister. No Known Allergies   Review of Systems  Respiratory:  Negative for shortness of breath.   Cardiovascular:  Negative for chest pain.  Gastrointestinal:  Negative for abdominal pain.  Genitourinary:  Negative for dysuria.  Objective:     BP 122/78 (BP Location: Left Arm, Cuff Size: Normal)   Pulse 65   Ht 6\' 4"  (1.93 m)   Wt 192 lb (87.1 kg)   SpO2 99%   BMI 23.37 kg/m  BP Readings from Last 3 Encounters:  11/13/22 122/78  07/03/22 (!) 138/98  05/15/22 138/62   Wt Readings from Last 3 Encounters:  11/13/22 192 lb (87.1 kg)  07/03/22  194 lb 3.2 oz (88.1 kg)  05/15/22 195 lb 4.8 oz (88.6 kg)      Physical Exam Vitals reviewed.  HENT:     Head: Normocephalic and atraumatic.  Cardiovascular:     Rate and Rhythm: Normal rate and regular rhythm.  Pulmonary:     Effort: Pulmonary effort is normal.     Breath sounds: Normal breath sounds. No wheezing or rales.  Musculoskeletal:     Right lower leg: No edema.     Left lower leg: No edema.  Neurological:     Mental Status: He is alert.      No results found for any visits on 11/13/22.  Last CBC Lab Results  Component Value Date   WBC 6.2 05/15/2022   HGB 15.6 05/15/2022   HCT 46.3 05/15/2022   MCV 98.0 05/15/2022   MCH 33.3 10/02/2021   RDW 14.6 05/15/2022   PLT 188.0 05/15/2022   Last metabolic panel Lab Results  Component Value Date   GLUCOSE 114 (H) 05/15/2022   NA 144 05/15/2022   K 4.3 05/15/2022   CL 107 05/15/2022   CO2 24 05/15/2022   BUN 25 (H) 05/15/2022   CREATININE 1.30 05/15/2022   GFR 48.99 (L) 05/15/2022   CALCIUM 9.8 05/15/2022   PROT 7.1 05/15/2022   ALBUMIN 4.2 05/15/2022   LABGLOB 2.7 04/08/2018   BILITOT 1.0 05/15/2022   ALKPHOS 55 05/15/2022   AST 26 05/15/2022   ALT 17 05/15/2022   ANIONGAP 8 10/03/2021   Last lipids Lab Results  Component Value Date   CHOL 156 05/15/2022   HDL 37.20 (L) 05/15/2022   LDLCALC 77 05/15/2021   LDLDIRECT 93.0 05/15/2022   TRIG 348.0 (H) 05/15/2022   CHOLHDL 4 05/15/2022   Last hemoglobin A1c Lab Results  Component Value Date   HGBA1C 6.0 10/18/2008   Last thyroid functions Lab Results  Component Value Date   TSH 5.07 05/15/2022      The ASCVD Risk score (Arnett DK, et al., 2019) failed to calculate for the following reasons:   The 2019 ASCVD risk score is only valid for ages 57 to 72    Assessment & Plan:   #1 hypertension.  Initial elevated reading here but improved significantly after rest.  Continue lisinopril 20 mg daily.  #2 chronic atrial fibrillation.  Patient  on Eliquis.  Stable at this time.  #3 hypothyroidism on levothyroxine.  TSH 6 months ago was stable and at goal  #4 Alzheimer's dementia.  Patient currently on Namenda.  Excellent family support.  #5 health maintenance.  Received recent RSV vaccine through the [place].  They had questions regarding shingles vaccine.  He had Zostavax 2014 and it looks like he had 1 Shingrix through the [place] 2021 but never got the second 1.  They will consider reinitiating series. Evelena Peat, MD

## 2022-12-17 ENCOUNTER — Ambulatory Visit: Payer: Medicare HMO

## 2022-12-17 DIAGNOSIS — I4821 Permanent atrial fibrillation: Secondary | ICD-10-CM | POA: Diagnosis not present

## 2022-12-18 LAB — CUP PACEART REMOTE DEVICE CHECK
Battery Remaining Longevity: 100 mo
Battery Remaining Percentage: 70 %
Battery Voltage: 3.01 V
Brady Statistic RV Percent Paced: 95 %
Date Time Interrogation Session: 20240924020023
Implantable Lead Connection Status: 753985
Implantable Lead Implant Date: 20200528
Implantable Lead Location: 753860
Implantable Pulse Generator Implant Date: 20200528
Lead Channel Impedance Value: 550 Ohm
Lead Channel Pacing Threshold Amplitude: 0.625 V
Lead Channel Pacing Threshold Pulse Width: 0.5 ms
Lead Channel Sensing Intrinsic Amplitude: 3.7 mV
Lead Channel Setting Pacing Amplitude: 0.875
Lead Channel Setting Pacing Pulse Width: 0.5 ms
Lead Channel Setting Sensing Sensitivity: 2 mV
Pulse Gen Model: 1272
Pulse Gen Serial Number: 9116714

## 2023-01-03 NOTE — Progress Notes (Signed)
Remote pacemaker transmission.   

## 2023-03-01 ENCOUNTER — Other Ambulatory Visit: Payer: Self-pay | Admitting: Family Medicine

## 2023-03-03 ENCOUNTER — Other Ambulatory Visit: Payer: Self-pay | Admitting: Family Medicine

## 2023-03-03 DIAGNOSIS — L814 Other melanin hyperpigmentation: Secondary | ICD-10-CM | POA: Diagnosis not present

## 2023-03-03 DIAGNOSIS — L578 Other skin changes due to chronic exposure to nonionizing radiation: Secondary | ICD-10-CM | POA: Diagnosis not present

## 2023-03-03 DIAGNOSIS — D225 Melanocytic nevi of trunk: Secondary | ICD-10-CM | POA: Diagnosis not present

## 2023-03-03 DIAGNOSIS — Z08 Encounter for follow-up examination after completed treatment for malignant neoplasm: Secondary | ICD-10-CM | POA: Diagnosis not present

## 2023-03-03 DIAGNOSIS — L821 Other seborrheic keratosis: Secondary | ICD-10-CM | POA: Diagnosis not present

## 2023-03-03 DIAGNOSIS — Z85820 Personal history of malignant melanoma of skin: Secondary | ICD-10-CM | POA: Diagnosis not present

## 2023-03-03 DIAGNOSIS — L570 Actinic keratosis: Secondary | ICD-10-CM | POA: Diagnosis not present

## 2023-03-14 DIAGNOSIS — H401132 Primary open-angle glaucoma, bilateral, moderate stage: Secondary | ICD-10-CM | POA: Diagnosis not present

## 2023-03-14 DIAGNOSIS — Z961 Presence of intraocular lens: Secondary | ICD-10-CM | POA: Diagnosis not present

## 2023-03-18 ENCOUNTER — Ambulatory Visit (INDEPENDENT_AMBULATORY_CARE_PROVIDER_SITE_OTHER): Payer: Medicare HMO

## 2023-03-18 DIAGNOSIS — I4821 Permanent atrial fibrillation: Secondary | ICD-10-CM | POA: Diagnosis not present

## 2023-03-20 LAB — CUP PACEART REMOTE DEVICE CHECK
Battery Remaining Longevity: 96 mo
Battery Remaining Percentage: 68 %
Battery Voltage: 3.01 V
Brady Statistic RV Percent Paced: 95 %
Date Time Interrogation Session: 20241224020014
Implantable Lead Connection Status: 753985
Implantable Lead Implant Date: 20200528
Implantable Lead Location: 753860
Implantable Pulse Generator Implant Date: 20200528
Lead Channel Impedance Value: 490 Ohm
Lead Channel Pacing Threshold Amplitude: 0.5 V
Lead Channel Pacing Threshold Pulse Width: 0.5 ms
Lead Channel Sensing Intrinsic Amplitude: 4.5 mV
Lead Channel Setting Pacing Amplitude: 0.75 V
Lead Channel Setting Pacing Pulse Width: 0.5 ms
Lead Channel Setting Sensing Sensitivity: 2 mV
Pulse Gen Model: 1272
Pulse Gen Serial Number: 9116714

## 2023-04-07 ENCOUNTER — Ambulatory Visit (INDEPENDENT_AMBULATORY_CARE_PROVIDER_SITE_OTHER): Payer: Medicare HMO

## 2023-04-07 VITALS — Ht 76.0 in | Wt 192.0 lb

## 2023-04-07 DIAGNOSIS — Z0000 Encounter for general adult medical examination without abnormal findings: Secondary | ICD-10-CM

## 2023-04-07 NOTE — Patient Instructions (Addendum)
 Daniel Everett , Thank you for taking time to come for your Medicare Wellness Visit. I appreciate your ongoing commitment to your health goals. Please review the following plan we discussed and let me know if I can assist you in the future.   Referrals/Orders/Follow-Ups/Clinician Recommendations:   This is a list of the screening recommended for you and due dates:  Health Maintenance  Topic Date Due   DTaP/Tdap/Td vaccine (4 - Td or Tdap) 06/25/2022   COVID-19 Vaccine (9 - 2024-25 season) 04/15/2023   Medicare Annual Wellness Visit  04/06/2024   Pneumonia Vaccine  Completed   Flu Shot  Completed   HPV Vaccine  Aged Out   Zoster (Shingles) Vaccine  Discontinued    Advanced directives: (Copy Requested) Please bring a copy of your health care power of attorney and living will to the office to be added to your chart at your convenience.  Next Medicare Annual Wellness Visit scheduled for next year: Yes

## 2023-04-07 NOTE — Progress Notes (Signed)
 Subjective:   Daniel Everett is a 89 y.o. male who presents for Medicare Annual/Subsequent preventive examination.  Visit Complete: Virtual I connected with  Daniel Everett on 04/07/23 by a audio enabled telemedicine application and verified that I am speaking with the correct person using two identifiers.  Patient Location: Home  Provider Location: Home Office  I discussed the limitations of evaluation and management by telemedicine. The patient expressed understanding and agreed to proceed.  Vital Signs: Because this visit was a virtual/telehealth visit, some criteria may be missing or patient reported. Any vitals not documented were not able to be obtained and vitals that have been documented are patient reported.   Cardiac Risk Factors include: advanced age (>12men, >65 women);male gender;hypertension     Objective:    Today's Vitals   04/07/23 1342  Weight: 192 lb (87.1 kg)  Height: 6' 4 (1.93 m)   Body mass index is 23.37 kg/m.     04/07/2023    1:53 PM 03/29/2022    2:11 PM 10/02/2021    1:14 AM 10/01/2021   11:54 AM 09/14/2021    4:58 PM 03/28/2021    2:06 PM 10/05/2019   10:33 AM  Advanced Directives  Does Patient Have a Medical Advance Directive? Yes Yes Yes No No Yes Yes  Type of Estate Agent of [place];Living will Healthcare Power of [place];Living will Healthcare Power of [employer] [place];Living will Healthcare Power of [place];Living will  Does patient want to make changes to medical advance directive?   No - Patient declined   No - Patient declined No - Patient declined  Copy of Healthcare Power of Attorney in Chart? No - copy requested No - copy requested No - copy requested   No - copy requested No - copy requested  Would patient like information on creating a medical advance directive?   No - Guardian declined No - Patient declined No - Patient declined      Current Medications (verified) Outpatient Encounter  Medications as of 04/07/2023  Medication Sig   acetaminophen  (TYLENOL ) 650 MG CR tablet Take 650 mg by mouth 2 (two) times a day.   apixaban  (ELIQUIS ) 5 MG TABS tablet Take 1 tablet (5 mg total) by mouth 2 (two) times daily.   atorvastatin  (LIPITOR) 20 MG tablet Take 1 tablet (20 mg total) by mouth daily. NEED OV.   Cholecalciferol  (VITAMIN D3) 50 MCG (2000 UT) TABS Take 2,000 Units by mouth daily.   finasteride  (PROSCAR ) 5 MG tablet TAKE 1 TABLET DAILY   furosemide  (LASIX ) 20 MG tablet TAKE 1 TABLET DAILY   levothyroxine  (SYNTHROID ) 75 MCG tablet TAKE 1 TABLET DAILY   lisinopril  (ZESTRIL ) 20 MG tablet TAKE 1 TABLET DAILY   memantine  (NAMENDA ) 10 MG tablet TAKE 1 TABLET TWICE A DAY   omeprazole  (PRILOSEC) 20 MG capsule TAKE 1 CAPSULE DAILY   pyridOXINE  (VITAMIN B-6) 100 MG tablet Take 100 mg by mouth daily.   timolol  (TIMOPTIC ) 0.5 % ophthalmic solution Place 1 drop into the left eye 2 (two) times daily.   triamcinolone  cream (KENALOG ) 0.1 % Apply 1 Application topically 2 (two) times daily. (Patient taking differently: Apply 1 Application topically 2 (two) times daily. Apply to sores on both legs)   No facility-administered encounter medications on file as of 04/07/2023.    Allergies (verified) Patient has no known allergies.   History: Past Medical History:  Diagnosis Date   Acute encephalopathy 10/01/2021   Acute on  chronic diastolic CHF (congestive heart failure)    Allergic rhinitis due to pollen 06/07/2008   Anticoagulated by anticoagulation treatment 01/08/2019   Chronic maxillary sinusitis 08/24/2008   Cortical senile cataract 05/03/2020   Dementia due to late onset Alzheimer's disease 10/31/2021   Dermatophytosis of nail 06/06/2009   Diverticulosis of colon 11/27/2006   Essential hypertension 10/28/2006   Essential tremor 06/25/2016   Gait abnormality 03/02/2018   Generalized osteoarthrosis, involving multiple sites 01/03/2010   GERD (gastroesophageal reflux disease)  02/08/2015   History of colonic polyps 10/28/2006   Hoarseness, chronic 10/18/2008   Hydrocele    S/P repair 08/2001 complicated by seroma   Hyperglycemia 10/18/2008   Hyperlipidemia 10/28/2006   Hypothyroidism 11/27/2006   Leg swelling 10/02/2021   Melanoma of left side of neck 01/29/2019   Nocturia 02/08/2015   Open-angle glaucoma 05/03/2020   Peripheral neuropathy 04/08/2018   Permanent atrial fibrillation 08/20/2018   Personal history of malignant neoplasm of prostate 05/28/2007   Presence of permanent cardiac pacemaker 08/20/2018   Senile nuclear sclerosis 05/03/2020   Past Surgical History:  Procedure Laterality Date   arthroscopy rt knee     EXCISION MASS NECK Left 01/29/2019   Procedure: 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;  Surgeon: Jesus Oliphant, MD;  Location: [facility] OR;  Service: ENT;  Laterality: Left;   HYDROCELE EXCISION / REPAIR     INSERT / REPLACE / REMOVE PACEMAKER  08/20/2018   PACEMAKER IMPLANT N/A 08/20/2018   Procedure: PACEMAKER IMPLANT;  Surgeon: Inocencio Soyla Lunger, MD;  Location: [facility];  Service: Cardiovascular;  Laterality: N/A;   PROSTATE SURGERY     TURP   ROTATOR CUFF REPAIR     Family History  Problem Relation Age of Onset   Coronary artery disease Mother    Heart disease Mother    Pancreatic cancer Father    Cancer Father    Tremor Sister    Colon cancer Sister        2021   Esophageal cancer Neg Hx    Stomach cancer Neg Hx    Rectal cancer Neg Hx    Social History   Socioeconomic History   Marital status: Widowed    Spouse name: Not on file   Number of children: 3   Years of education: 16   Highest education level: Bachelor's degree (e.g., BA, AB, BS)  Occupational History   Occupation: retired  Tobacco Use   Smoking status: Never   Smokeless tobacco: Never  Vaping Use   Vaping status: Never Used  Substance and Sexual Activity   Alcohol use: No   Drug use: No   Sexual activity: Not Currently   Other Topics Concern   Not on file  Social History Narrative   Lives alone   Caffeine use: very little   Right handed       3 children    2 in [place] and one in near [employer] graduate and played basketball there   Graduated in 59    Social Drivers of Health   Financial Resource Strain: Low Risk  (04/07/2023)   Overall Financial Resource Strain (CARDIA)    Difficulty of Paying Living Expenses: Not hard at all  Food Insecurity: No Food Insecurity (04/07/2023)   Hunger Vital Sign    Worried About Running Out of Food in the Last Year: Never true    Ran Out of Food in the Last Year: Never true  Transportation Needs:  No Transportation Needs (04/07/2023)   PRAPARE - Administrator, Civil Service (Medical): No    Lack of Transportation (Non-Medical): No  Physical Activity: Sufficiently Active (04/07/2023)   Exercise Vital Sign    Days of Exercise per Week: 7 days    Minutes of Exercise per Session: 30 min  Stress: No Stress Concern Present (04/07/2023)   [employer] of Occupational Health - Occupational Stress Questionnaire    Feeling of Stress : Not at all  Social Connections: Socially Isolated (04/07/2023)   Social Connection and Isolation Panel [NHANES]    Frequency of Communication with Friends and Family: More than three times a week    Frequency of Social Gatherings with Friends and Family: More than three times a week    Attends Religious Services: Never    Database Administrator or Organizations: No    Attends Banker Meetings: Never    Marital Status: Widowed    Tobacco Counseling Counseling given: Not Answered   Clinical Intake:  Pre-visit preparation completed: Yes  Pain : No/denies pain     BMI - recorded: 23.37 Nutritional Status: BMI of 19-24  Normal Nutritional Risks: None Diabetes: No  How often do you need to have someone help you when you read instructions, pamphlets, or other written materials from your  doctor or pharmacy?: 3 - Sometimes (Aide assist)  Interpreter Needed?: No  Information entered by :: Rojelio Blush LPN   Activities of Daily Living    04/07/2023    1:50 PM  In your present state of health, do you have any difficulty performing the following activities:  Hearing? 0  Vision? 0  Difficulty concentrating or making decisions? 1  Comment Dx: Dementia  Walking or climbing stairs? 0  Dressing or bathing? 1  Comment Aide assist  Doing errands, shopping? 1  Comment Aide Ship Broker and eating ? Y  Comment Aide assist  Using the Toilet? N  In the past six months, have you accidently leaked urine? N  Do you have problems with loss of bowel control? N  Managing your Medications? Y  Comment Aide assist  Managing your Finances? N  Comment Daughter assist  Housekeeping or managing your Housekeeping? Y  Comment Aide assist    Patient Care Team: Micheal Wolm ORN, MD as PCP - General (Family Medicine) Inocencio Soyla Lunger, MD as PCP - Electrophysiology (Cardiology) Jordan, Peter M, MD as PCP - Cardiology (Cardiology)  Indicate any recent Medical Services you may have received from other than [facility] providers in the past year (date may be approximate).     Assessment:   This is a routine wellness examination for Kwaku.  Hearing/Vision screen Hearing Screening - Comments:: Denies hearing difficulties   Vision Screening - Comments:: Wears rx glasses - up to date with routine eye exams with  Deferred   Goals Addressed               This Visit's Progress     No current goals (pt-stated)         Depression Screen    04/07/2023    1:49 PM 03/29/2022    1:44 PM 05/22/2021    2:43 PM 03/28/2021    1:58 PM 05/03/2020    2:57 PM 03/01/2019    9:03 AM 10/30/2018    7:44 AM  PHQ 2/9 Scores  PHQ - 2 Score 0 0 0 0 0 0 0  PHQ- 9 Score   2  2  0    Fall Risk    04/07/2023    1:52 PM 05/15/2022    3:46 PM 03/29/2022    2:10 PM 05/14/2021    2:05 PM 03/28/2021     2:01 PM  Fall Risk   Falls in the past year? 0 0 1 0 0  Number falls in past yr: 0 0 0 0 0  Injury with Fall? 0 0 0 0 0  Comment   Followed by Medical Attention    Risk for fall due to : No Fall Risks No Fall Risks No Fall Risks;Impaired balance/gait    Follow up Falls prevention discussed Falls evaluation completed Falls prevention discussed      MEDICARE RISK AT HOME: Medicare Risk at Home Any stairs in or around the home?: No If so, are there any without handrails?: No Home free of loose throw rugs in walkways, pet beds, electrical cords, etc?: Yes Adequate lighting in your home to reduce risk of falls?: Yes Life alert?: No Use of a cane, walker or w/c?: Yes Grab bars in the bathroom?: No Shower chair or bench in shower?: No Elevated toilet seat or a handicapped toilet?: No  TIMED UP AND GO:  Was the test performed?  No    Cognitive Function:    07/09/2017   11:31 AM  MMSE - Mini Mental State Exam  Not completed: --        04/07/2023    1:54 PM 03/29/2022    2:12 PM 03/28/2021    2:04 PM 03/01/2019    9:10 AM  6CIT Screen  What Year? 4 points 0 points 0 points 0 points  What month? 3 points 0 points 0 points 0 points  What time? 0 points 0 points 0 points 0 points  Count back from 20 0 points 0 points 0 points 0 points  Months in reverse 0 points 0 points 0 points 0 points  Repeat phrase 10 points 4 points 2 points 2 points  Total Score 17 points 4 points 2 points 2 points    Immunizations Immunization History  Administered Date(s) Administered   Fluad Quad(high Dose 65+) 12/08/2018, 02/10/2020, 02/08/2021   Influenza Split 12/31/2010, 12/17/2011, 01/24/2015   Influenza Whole 03/25/2001, 01/28/2007, 12/10/2007, 02/08/2009, 01/03/2010   Influenza, High Dose Seasonal PF 12/18/2012, 02/08/2015, 11/28/2015, 01/24/2016, 12/25/2016, 01/08/2018, 12/29/2021   Influenza,inj,Quad PF,6+ Mos 02/07/2014   Influenza-Unspecified 12/24/1999, 01/23/2001, 01/23/2002,  01/27/2002, 02/01/2003, 01/24/2004, 01/03/2005, 12/23/2005, 12/24/2006, 03/25/2009, 01/24/2011, 11/24/2011, 12/24/2011, 11/24/2018   Moderna Covid-19 Fall Seasonal Vaccine 22yrs & older 12/29/2021, 08/22/2022, 02/18/2023   Moderna Covid-19 Vaccine Bivalent Booster 76yrs & up 02/08/2021   Moderna Sars-Covid-2 Vaccination 05/07/2019, 06/04/2019, 02/10/2020, 08/08/2020   Pneumococcal Conjugate-13 05/24/2013, 02/07/2014   Pneumococcal Polysaccharide-23 03/25/2001, 06/24/2012   Pneumococcal-Unspecified 01/23/2002   RSV,unspecified 08/22/2022   Rsv, Bivalent, Protein Subunit Rsvpref,pf [place]) 08/22/2022   Td 03/25/2001, 06/24/2012   Tdap 05/20/2011   Unspecified SARS-COV-2 Vaccination 12/29/2021   Zoster Recombinant(Shingrix) 07/30/2019, 07/30/2019   Zoster, Live 03/25/2010, 05/20/2012    TDAP status: Due, Education has been provided regarding the importance of this vaccine. Advised may receive this vaccine at local pharmacy or Health Dept. Aware to provide a copy of the vaccination record if obtained from local pharmacy or Health Dept. Verbalized acceptance and understanding.  Flu Vaccine status: Up to date  Pneumococcal vaccine status: Up to date  Covid-19 vaccine status: Completed vaccines    Screening Tests Health Maintenance  Topic Date Due   DTaP/Tdap/Td (4 - Td or  Tdap) 06/25/2022   COVID-19 Vaccine (9 - 2024-25 season) 04/15/2023   Medicare Annual Wellness (AWV)  04/06/2024   Pneumonia Vaccine 64+ Years old  Completed   INFLUENZA VACCINE  Completed   HPV VACCINES  Aged Out   Zoster Vaccines- Shingrix  Discontinued    Health Maintenance  Health Maintenance Due  Topic Date Due   DTaP/Tdap/Td (4 - Td or Tdap) 06/25/2022         Additional Screening:    Vision Screening: Recommended annual ophthalmology exams for early detection of glaucoma and other disorders of the eye. Is the patient up to date with their annual eye exam?  Yes  Who is the provider or what  is the name of the office in which the patient attends annual eye exams? Deferred If pt is not established with a provider, would they like to be referred to a provider to establish care? No .   Dental Screening: Recommended annual dental exams for proper oral hygiene    Community Resource Referral / Chronic Care Management:  CRR required this visit?  No   CCM required this visit?  No     Plan:     I have personally reviewed and noted the following in the patient's chart:   Medical and social history Use of alcohol, tobacco or illicit drugs  Current medications and supplements including opioid prescriptions. Patient is not currently taking opioid prescriptions. Functional ability and status Nutritional status Physical activity Advanced directives List of other physicians Hospitalizations, surgeries, and ER visits in previous 12 months Vitals Screenings to include cognitive, depression, and falls Referrals and appointments  In addition, I have reviewed and discussed with patient certain preventive protocols, quality metrics, and best practice recommendations. A written personalized care plan for preventive services as well as general preventive health recommendations were provided to patient.     Rojelio LELON Blush, LPN   [id number]/[id number]   After Visit Summary: (MyChart) Due to this being a telephonic visit, the after visit summary with patients personalized plan was offered to patient via MyChart   Nurse Notes: None

## 2023-04-23 DIAGNOSIS — Z713 Dietary counseling and surveillance: Secondary | ICD-10-CM | POA: Diagnosis not present

## 2023-04-23 DIAGNOSIS — L821 Other seborrheic keratosis: Secondary | ICD-10-CM | POA: Diagnosis not present

## 2023-04-23 DIAGNOSIS — I872 Venous insufficiency (chronic) (peripheral): Secondary | ICD-10-CM | POA: Diagnosis not present

## 2023-04-23 DIAGNOSIS — L89109 Pressure ulcer of unspecified part of back, unspecified stage: Secondary | ICD-10-CM | POA: Diagnosis not present

## 2023-04-23 DIAGNOSIS — L578 Other skin changes due to chronic exposure to nonionizing radiation: Secondary | ICD-10-CM | POA: Diagnosis not present

## 2023-05-14 ENCOUNTER — Ambulatory Visit: Payer: Medicare HMO | Admitting: Family Medicine

## 2023-05-21 ENCOUNTER — Ambulatory Visit (INDEPENDENT_AMBULATORY_CARE_PROVIDER_SITE_OTHER): Payer: Medicare HMO | Admitting: Family Medicine

## 2023-05-21 ENCOUNTER — Encounter: Payer: Self-pay | Admitting: Family Medicine

## 2023-05-21 VITALS — BP 124/80 | HR 61 | Temp 97.2°F | Wt 188.7 lb

## 2023-05-21 DIAGNOSIS — G309 Alzheimer's disease, unspecified: Secondary | ICD-10-CM

## 2023-05-21 DIAGNOSIS — E785 Hyperlipidemia, unspecified: Secondary | ICD-10-CM

## 2023-05-21 DIAGNOSIS — I10 Essential (primary) hypertension: Secondary | ICD-10-CM | POA: Diagnosis not present

## 2023-05-21 DIAGNOSIS — F0280 Dementia in other diseases classified elsewhere without behavioral disturbance: Secondary | ICD-10-CM

## 2023-05-21 DIAGNOSIS — I5032 Chronic diastolic (congestive) heart failure: Secondary | ICD-10-CM

## 2023-05-21 DIAGNOSIS — E038 Other specified hypothyroidism: Secondary | ICD-10-CM

## 2023-05-21 LAB — COMPREHENSIVE METABOLIC PANEL
ALT: 15 U/L (ref 0–53)
AST: 25 U/L (ref 0–37)
Albumin: 4.1 g/dL (ref 3.5–5.2)
Alkaline Phosphatase: 50 U/L (ref 39–117)
BUN: 27 mg/dL — ABNORMAL HIGH (ref 6–23)
CO2: 26 meq/L (ref 19–32)
Calcium: 9.3 mg/dL (ref 8.4–10.5)
Chloride: 108 meq/L (ref 96–112)
Creatinine, Ser: 1.28 mg/dL (ref 0.40–1.50)
GFR: 49.55 mL/min — ABNORMAL LOW (ref >60.00)
Glucose, Bld: 85 mg/dL (ref 70–99)
Potassium: 3.9 meq/L (ref 3.5–5.1)
Sodium: 145 meq/L (ref 135–145)
Total Bilirubin: 1 mg/dL (ref 0.2–1.2)
Total Protein: 7.2 g/dL (ref 6.0–8.3)

## 2023-05-21 LAB — LIPID PANEL
Cholesterol: 140 mg/dL (ref 0–200)
HDL: 38.1 mg/dL — ABNORMAL LOW (ref >39.00)
LDL Cholesterol: 75 mg/dL (ref 0–99)
NonHDL: 101.99
Total CHOL/HDL Ratio: 4
Triglycerides: 133 mg/dL (ref 0.0–149.0)
VLDL: 26.6 mg/dL (ref 0.0–40.0)

## 2023-05-21 NOTE — Progress Notes (Signed)
 Established Patient Office Visit  Subjective   Patient ID: MILANO ROSEVEAR, male    DOB: May 26, 1931  Age: 89 y.o. MRN: [id number]  No chief complaint on file.   HPI   Mr. Omlor is here for medical follow-up.  He is accompanied by his daughter who is one of his primary caregivers.  His children alternate between staying with him at night.  He has history of diastolic heart failure, hypertension, atrial fibrillation, GERD, hypothyroidism, dementia, essential tremor, peripheral neuropathy, past history of melanoma, open-angle glaucoma  He is followed at the [place] and they bring in copy of labs that were done at the [place] back in December.  Creatinine 1.39.  TSH normal range.  CBC normal.  Patient was prescribed Atrovent nasal at that time for some rhinorrhea that he has been eating.  We did caution them about potential for anticholinergic side effects with Atrovent.  No recent falls.  Appetite stable.  Excellent family support.  Compliant with all medications.  Past Medical History:  Diagnosis Date   Acute encephalopathy 10/01/2021   Acute on chronic diastolic CHF (congestive heart failure)    Allergic rhinitis due to pollen 06/07/2008   Anticoagulated by anticoagulation treatment 01/08/2019   Chronic maxillary sinusitis 08/24/2008   Cortical senile cataract 05/03/2020   Dementia due to late onset Alzheimer's disease 10/31/2021   Dermatophytosis of nail 06/06/2009   Diverticulosis of colon 11/27/2006   Essential hypertension 10/28/2006   Essential tremor 06/25/2016   Gait abnormality 03/02/2018   Generalized osteoarthrosis, involving multiple sites 01/03/2010   GERD (gastroesophageal reflux disease) 02/08/2015   History of colonic polyps 10/28/2006   Hoarseness, chronic 10/18/2008   Hydrocele    S/P repair 08/2001 complicated by seroma   Hyperglycemia 10/18/2008   Hyperlipidemia 10/28/2006   Hypothyroidism 11/27/2006   Leg swelling 10/02/2021   Melanoma of left side of neck 01/29/2019    Nocturia 02/08/2015   Open-angle glaucoma 05/03/2020   Peripheral neuropathy 04/08/2018   Permanent atrial fibrillation 08/20/2018   Personal history of malignant neoplasm of prostate 05/28/2007   Presence of permanent cardiac pacemaker 08/20/2018   Senile nuclear sclerosis 05/03/2020   Past Surgical History:  Procedure Laterality Date   arthroscopy rt knee     EXCISION MASS NECK Left 01/29/2019   Procedure: 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;  Surgeon: Serena Colonel, MD;  Location: [facility] OR;  Service: ENT;  Laterality: Left;   HYDROCELE EXCISION / REPAIR     INSERT / REPLACE / REMOVE PACEMAKER  08/20/2018   PACEMAKER IMPLANT N/A 08/20/2018   Procedure: PACEMAKER IMPLANT;  Surgeon: Regan Lemming, MD;  Location: [facility];  Service: Cardiovascular;  Laterality: N/A;   PROSTATE SURGERY     TURP   ROTATOR CUFF REPAIR      reports that he has never smoked. He has never used smokeless tobacco. He reports that he does not drink alcohol and does not use drugs. family history includes Cancer in his father; Colon cancer in his sister; Coronary artery disease in his mother; Heart disease in his mother; Pancreatic cancer in his father; Tremor in his sister. No Known Allergies  Review of Systems  Constitutional:  Negative for fever and malaise/fatigue.  Eyes:  Negative for blurred vision.  Respiratory:  Negative for shortness of breath.   Cardiovascular:  Negative for chest pain.  Neurological:  Negative for dizziness, weakness and headaches.      Objective:     BP 124/80 (  BP Location: Left Arm, Cuff Size: Normal)   Pulse 61   Temp (!) 97.2 F (36.2 C) (Oral)   Wt 188 lb 11.2 oz (85.6 kg)   SpO2 94%   BMI 22.97 kg/m  BP Readings from Last 3 Encounters:  05/21/23 124/80  11/13/22 122/78  07/03/22 (!) 138/98   Wt Readings from Last 3 Encounters:  05/21/23 188 lb 11.2 oz (85.6 kg)  04/07/23 192 lb (87.1 kg)  11/13/22 192 lb (87.1 kg)       Physical Exam Vitals reviewed.  Constitutional:      General: He is not in acute distress.    Appearance: He is well-developed.  Eyes:     Pupils: Pupils are equal, round, and reactive to light.  Neck:     Thyroid: No thyromegaly.  Cardiovascular:     Rate and Rhythm: Normal rate and regular rhythm.  Pulmonary:     Effort: Pulmonary effort is normal. No respiratory distress.     Breath sounds: Normal breath sounds. No wheezing or rales.  Musculoskeletal:     Cervical back: Neck supple.     Right lower leg: No edema.     Left lower leg: No edema.  Neurological:     Mental Status: He is alert and oriented to person, place, and time.      No results found for any visits on 05/21/23.    The ASCVD Risk score (Arnett DK, et al., 2019) failed to calculate for the following reasons:   The 2019 ASCVD risk score is only valid for ages 5 to 81    Assessment & Plan:   #1 hypertension stable.  Continue lisinopril.  #2 hyperlipidemia.  Patient on atorvastatin 20 mg daily.  Due for follow-up lipids.  Will check lipid and CMP panel.  #3 hypothyroidism treated with levothyroxine 75 mcg daily.  Recent TSH in December at the [facility] was normal range.  #4 dementia without behavioral disturbance.  Excellent family support.  Past 24/7 coverage with family.  Continue Namenda.  #5 history of BPH.  Denies any recent slow urinary stream.  Patient on finasteride.  Caution about anticholinergic such as Atrovent to be careful with those  #6 history of heart failure with preserved ejection fraction.  No peripheral edema at this time.  Patient on low-dose furosemide.  Recent electrolytes and renal function stable per [place].   Return in about 6 months (around 11/18/2023).    Evelena Peat, MD

## 2023-06-05 ENCOUNTER — Other Ambulatory Visit: Payer: Self-pay

## 2023-06-05 MED ORDER — FINASTERIDE 5 MG PO TABS: 5.0000 mg | ORAL_TABLET | Freq: Every day | ORAL | 1 refills | Status: DC

## 2023-06-08 DIAGNOSIS — M545 Low back pain, unspecified: Secondary | ICD-10-CM | POA: Diagnosis not present

## 2023-06-08 DIAGNOSIS — M25531 Pain in right wrist: Secondary | ICD-10-CM | POA: Diagnosis not present

## 2023-06-08 DIAGNOSIS — M25532 Pain in left wrist: Secondary | ICD-10-CM | POA: Diagnosis not present

## 2023-06-09 ENCOUNTER — Encounter: Payer: Self-pay | Admitting: Family Medicine

## 2023-06-14 ENCOUNTER — Emergency Department (HOSPITAL_COMMUNITY)
Admission: EM | Admit: 2023-06-14 | Discharge: 2023-06-14 | Disposition: A | Discharge status: Home / Self Care | Attending: Emergency Medicine | Admitting: Emergency Medicine

## 2023-06-14 ENCOUNTER — Encounter (HOSPITAL_COMMUNITY): Payer: Self-pay | Admitting: Emergency Medicine

## 2023-06-14 ENCOUNTER — Other Ambulatory Visit: Payer: Self-pay

## 2023-06-14 ENCOUNTER — Emergency Department (HOSPITAL_COMMUNITY)

## 2023-06-14 DIAGNOSIS — I110 Hypertensive heart disease with heart failure: Secondary | ICD-10-CM | POA: Diagnosis not present

## 2023-06-14 DIAGNOSIS — S22040A Wedge compression fracture of fourth thoracic vertebra, initial encounter for closed fracture: Secondary | ICD-10-CM | POA: Insufficient documentation

## 2023-06-14 DIAGNOSIS — W1830XA Fall on same level, unspecified, initial encounter: Secondary | ICD-10-CM | POA: Insufficient documentation

## 2023-06-14 DIAGNOSIS — F0390 Unspecified dementia without behavioral disturbance: Secondary | ICD-10-CM | POA: Diagnosis not present

## 2023-06-14 DIAGNOSIS — S32010A Wedge compression fracture of first lumbar vertebra, initial encounter for closed fracture: Secondary | ICD-10-CM | POA: Insufficient documentation

## 2023-06-14 DIAGNOSIS — M549 Dorsalgia, unspecified: Secondary | ICD-10-CM | POA: Diagnosis not present

## 2023-06-14 DIAGNOSIS — S22000A Wedge compression fracture of unspecified thoracic vertebra, initial encounter for closed fracture: Secondary | ICD-10-CM

## 2023-06-14 DIAGNOSIS — S22080A Wedge compression fracture of T11-T12 vertebra, initial encounter for closed fracture: Secondary | ICD-10-CM | POA: Insufficient documentation

## 2023-06-14 DIAGNOSIS — M546 Pain in thoracic spine: Secondary | ICD-10-CM | POA: Insufficient documentation

## 2023-06-14 DIAGNOSIS — Z7901 Long term (current) use of anticoagulants: Secondary | ICD-10-CM | POA: Insufficient documentation

## 2023-06-14 DIAGNOSIS — M545 Low back pain, unspecified: Secondary | ICD-10-CM | POA: Diagnosis not present

## 2023-06-14 DIAGNOSIS — I509 Heart failure, unspecified: Secondary | ICD-10-CM | POA: Diagnosis not present

## 2023-06-14 DIAGNOSIS — Z79899 Other long term (current) drug therapy: Secondary | ICD-10-CM | POA: Insufficient documentation

## 2023-06-14 MED ORDER — HYDROCODONE-ACETAMINOPHEN 5-325 MG PO TABS: 1.0000 | ORAL_TABLET | ORAL | 0 refills | Status: DC | PRN

## 2023-06-14 NOTE — Care Management (Signed)
 Transition of Care [facility]) - Emergency Department Mini Assessment   Patient Details  Name: Daniel Everett MRN: [id number] Date of Birth: 01/30/1932  [facility] CM/SW Contact:    Lockie Pares, RN Phone Number: 06/14/2023, 4:13 PM   Clinical Narrative:  Consult for HH, Paitent fell a few days ago and having ongoing back pain. Placed in a TLSO. Home health obtained [facility] accepted patient.  Patient ambulating after brace placed. ED Mini Assessment: What brought you to the Emergency Department? : Fall           Interventions which prevented an admission or readmission: Home Health Consult or Services    Patient Contact and Communications        ,                 Admission diagnosis:  Fall, Difficulty Moving Patient Active Problem List   Diagnosis Date Noted   Dementia due to late onset Alzheimer's disease 10/31/2021   Leg swelling 10/02/2021   Acute on chronic diastolic CHF (congestive heart failure)    Acute encephalopathy 10/01/2021   Hydrocele 05/03/2020   Open-angle glaucoma 05/03/2020   Psychosexual dysfunction with inhibited sexual excitement 05/03/2020   Cortical senile cataract 05/03/2020   Senile nuclear sclerosis 05/03/2020   Melanoma of left side of neck 01/29/2019   Anticoagulated by anticoagulation treatment 01/08/2019   Permanent atrial fibrillation (HCC) 08/20/2018   Presence of permanent cardiac pacemaker 08/20/2018   Peripheral neuropathy 04/08/2018   Gait abnormality 03/02/2018   Essential tremor 06/25/2016   GERD (gastroesophageal reflux disease) 02/08/2015   Nocturia 02/08/2015   Generalized osteoarthrosis, involving multiple sites 01/03/2010   Dermatophytosis of nail 06/06/2009   Hoarseness, chronic 10/18/2008   Chronic maxillary sinusitis 08/24/2008   Allergic rhinitis due to pollen 06/07/2008   Personal history of malignant neoplasm of prostate 05/28/2007   Hypothyroidism 11/27/2006   Diverticulosis of colon  11/27/2006   Hyperlipidemia 10/28/2006   Essential hypertension 10/28/2006   History of colonic polyps 10/28/2006   PCP:  Kristian Covey, MD Pharmacy:   [facility] [address] [place], [place] - [address] [place] [address] Phone: [phone] Fax: [phone]  [facility] - [place], [place] - [address] [facility] AT Portal to Registered [facility] [address] [place] [address] Phone: [phone] Fax: [phone]

## 2023-06-14 NOTE — Discharge Instructions (Addendum)
 Follow-up with orthopedics as scheduled on Monday.  Return to emergency room if you have any worsening symptoms.

## 2023-06-14 NOTE — ED Notes (Signed)
 Family at bedside, pt resting quietly in bed watching TV

## 2023-06-14 NOTE — ED Notes (Signed)
 TLSO Brace placed on pt by ortho tech and this RN. Pt ambulated in room

## 2023-06-14 NOTE — ED Triage Notes (Signed)
 Pt bib EMS from home c/o lower back pain. Pt fell 6 days ago backwards but did not hit head. He did go to urgent care and they performed xray and prescribed him PO tylenol. He took the tylenol this morning. Denies N/V/D.

## 2023-06-14 NOTE — ED Notes (Signed)
 Ambulated pt before brace, and pt uses a walker. Pt is is able to get on his feet in little assist, steady standing on his own with walker, but in pain when walking.

## 2023-06-14 NOTE — ED Provider Notes (Signed)
 [facility] AT [facility] Provider Note   CSN: [id number] Arrival date & time: 06/14/23  1021     History  Chief Complaint  Patient presents with   Back Pain    Daniel Everett is a 89 y.o. male.  Patient is a 89 year old male who presents with back pain.  He has a history of hypertension, atrial fibrillation on Eliquis, CHF, dementia.  He was seen in urgent care on 3/16 after he had a fall and was complaining of back pain as well as some wrist pain.  X-rays of the wrist did not reveal any fractures.  X-rays were done of the lumbosacral spine.  On chart review, he had some age-indeterminate compression fractures.  He was given a prescription for Tylenol with codeine for pain.  He had been doing okay according to his 2 daughters who are at bedside.  He has been walking a little bit slower due to the back discomfort but overall has been able to ambulate and get around normally.  This morning he was having some increased back pain and they were not able to get him out of bed because he was wincing due to the pain.  He has been at his normal baseline mental status per their report.  He does not have any history of recent vomiting.  No urinary symptoms.  No known fevers.       Home Medications Prior to Admission medications   Medication Sig Start Date End Date Taking? Authorizing Provider  HYDROcodone-acetaminophen (NORCO/VICODIN) 5-325 MG tablet Take 1 tablet by mouth every 4 (four) hours as needed. 06/14/23  Yes Rolan Bucco, MD  acetaminophen (TYLENOL) 650 MG CR tablet Take 650 mg by mouth 2 (two) times a day.    [provider]  apixaban (ELIQUIS) 5 MG TABS tablet Take 1 tablet (5 mg total) by mouth 2 (two) times daily. 05/15/17   [place], Peter M, MD  atorvastatin (LIPITOR) 20 MG tablet Take 1 tablet (20 mg total) by mouth daily. NEED OV. 07/03/22   [place], Peter M, MD  Cholecalciferol (VITAMIN D3) 50 MCG (2000 UT) TABS Take 2,000 Units by mouth  daily.    [provider]  finasteride (PROSCAR) 5 MG tablet Take 1 tablet (5 mg total) by mouth daily. 06/05/23   Burchette, Elberta Fortis, MD  furosemide (LASIX) 20 MG tablet TAKE 1 TABLET DAILY 03/04/23   Burchette, Elberta Fortis, MD  levothyroxine (SYNTHROID) 75 MCG tablet TAKE 1 TABLET DAILY 03/04/23   Burchette, Elberta Fortis, MD  lisinopril (ZESTRIL) 20 MG tablet TAKE 1 TABLET DAILY 08/05/22   [place], Peter M, MD  memantine (NAMENDA) 10 MG tablet TAKE 1 TABLET TWICE A DAY 03/04/23   Burchette, Elberta Fortis, MD  omeprazole (PRILOSEC) 20 MG capsule TAKE 1 CAPSULE DAILY 07/03/22   [place], Peter M, MD  pyridOXINE (VITAMIN B-6) 100 MG tablet Take 100 mg by mouth daily.    [provider]  timolol (TIMOPTIC) 0.5 % ophthalmic solution Place 1 drop into the left eye 2 (two) times daily.    [provider]  triamcinolone cream (KENALOG) 0.1 % Apply 1 Application topically 2 (two) times daily. Patient taking differently: Apply 1 Application topically 2 (two) times daily. Apply to sores on both legs 09/18/21   Kristian Covey, MD      Allergies    Patient has no known allergies.    Review of Systems   Review of Systems  Unable to perform ROS: Dementia  Physical Exam Updated Vital Signs BP (!) 174/107   Pulse 62   Temp 98.3 F (36.8 C)   Resp 15   Ht 6\' 4"  (1.93 m)   Wt 85.3 kg   SpO2 98%   BMI 22.88 kg/m  Physical Exam Constitutional:      Appearance: He is well-developed.  HENT:     Head: Normocephalic and atraumatic.  Eyes:     Pupils: Pupils are equal, round, and reactive to light.  Cardiovascular:     Rate and Rhythm: Normal rate and regular rhythm.     Heart sounds: Normal heart sounds.  Pulmonary:     Effort: Pulmonary effort is normal. No respiratory distress.     Breath sounds: Normal breath sounds. No wheezing or rales.  Chest:     Chest wall: No tenderness.  Abdominal:     General: Bowel sounds are normal.     Palpations: Abdomen is soft.     Tenderness:  There is no abdominal tenderness. There is no guarding or rebound.  Musculoskeletal:        General: Normal range of motion.     Cervical back: Normal range of motion and neck supple.  Lymphadenopathy:     Cervical: No cervical adenopathy.  Skin:    General: Skin is warm and dry.     Findings: No rash.  Neurological:     General: No focal deficit present.     Mental Status: He is alert.     Comments: Awake and alert.  Answers questions appropriately.  Lower 5 out of 5 to the upper extremities bilaterally.  4 out of 5 to lower extremities bilaterally.  He has normal sensation to light touch in all extremities.  Negative straight leg raise to his lower extremities bilaterally.  Pedal pulses are intact.     ED Results / Procedures / Treatments   Labs (all labs ordered are listed, but only abnormal results are displayed) Labs Reviewed - No data to display  EKG None  Radiology CT Lumbar Spine Wo Contrast Result Date: 06/14/2023 CLINICAL DATA:  Back trauma EXAM: CT THORACIC AND LUMBAR SPINE WITHOUT CONTRAST TECHNIQUE: Multidetector CT imaging of the thoracic and lumbar spine was performed without contrast. Multiplanar CT image reconstructions were also generated. RADIATION DOSE REDUCTION: This exam was performed according to the departmental dose-optimization program which includes automated exposure control, adjustment of the mA and/or kV according to patient size and/or use of iterative reconstruction technique. COMPARISON:  MRI lumbar spine 10/03/2021 FINDINGS: CT THORACIC SPINE FINDINGS Alignment: Thoracic kyphosis is maintained.  No listhesis. Vertebrae: Irregularity of the T4 superior endplate with approximately 20% height loss concerning for compression fracture. Additional subtle irregularity of the T2 and T3 superior endplates without significant height loss. No retropulsion. Additional fracture through the superior aspect of the T12 vertebral body with subtle height loss anteriorly. No  suspicious osseous lesion. Paraspinal and other soft tissues: The paraspinal musculature is unremarkable. Bilateral pleural effusions, right greater than left with associated atelectasis. Atherosclerosis of the thoracic aorta. Partially visualized pacer leads. Disc levels: Intervertebral disc spaces are maintained. Vacuum disc phenomenon at T9-10 and T10-11. No CT evidence of large disc herniation. Small disc bulges and posterior osteophytes noted at the T9-10 and T10-11 levels. No high-grade spinal canal stenosis. Facet arthrosis at multiple levels. Mild foraminal narrowing bilaterally at T1-2. Additional mild foraminal narrowing on the right at T9-10 and bilaterally at T10-11. CT LUMBAR SPINE FINDINGS Segmentation: 5 lumbar type vertebrae. Alignment: Straightening of the  normal lumbar lordosis. Trace retrolisthesis of L2 on L3 and of L5 on S1. Vertebrae: Fracture line involving the right anterior aspect of the L1 vertebral body. Fracture line extends along the superior endplate. There is approximately 15% height loss of L1 centrally. Chronic appearing irregularity of the L5 inferior endplate. Vertebral body heights otherwise maintained. Paraspinal and other soft tissues: The paraspinal musculature is unremarkable. Mild-to-moderate atherosclerosis of the abdominal aorta and branch vessels. Partially visualized cyst in the right hepatic lobe. Disc levels: Mild disc space narrowing at multiple levels. Vacuum disc phenomenon at L2-3. There is additional moderate disc space narrowing at L4-5 and L5-S1. Small disc bulge at L1-2. Disc bulge and posterior osteophytes at L2-3 along with facet arthrosis resulting in mild spinal canal stenosis. Disc bulge, posterior osteophytes, and moderate facet arthrosis at L3-4 resulting in moderate to severe spinal canal stenosis. Additional disc bulge and facet arthrosis at L4-5 resulting in moderate spinal canal stenosis. Disc bulge at L5-S1 without significant spinal canal stenosis.  Foraminal narrowing at multiple levels. There is moderate foraminal narrowing noted the left at L4-5 and bilaterally at L5-S1. IMPRESSION: CT THORACIC SPINE IMPRESSION Compression fracture of T4 with 20% height loss centrally. Additional irregularity of the T2 and T3 superior endplates which may reflect additional compression fractures. Consider MRI for further evaluation. Compression fracture of T12 with subtle height loss anteriorly. No retropulsion. Degenerative changes as above. Bilateral pleural effusions. CT LUMBAR SPINE IMPRESSION Additional compression fracture of L1 with up to 15% height loss centrally. No retropulsion. Degenerative changes of the lumbar spine as above. At least moderate to severe spinal canal stenosis at L3-4. Additional moderate spinal canal stenosis at L4-5. Foraminal narrowing greatest on the left at L4-5 and bilaterally at L5-S1. Electronically Signed   By: Emily Filbert M.D.   On: 06/14/2023 13:31   CT Thoracic Spine Wo Contrast Result Date: 06/14/2023 CLINICAL DATA:  Back trauma EXAM: CT THORACIC AND LUMBAR SPINE WITHOUT CONTRAST TECHNIQUE: Multidetector CT imaging of the thoracic and lumbar spine was performed without contrast. Multiplanar CT image reconstructions were also generated. RADIATION DOSE REDUCTION: This exam was performed according to the departmental dose-optimization program which includes automated exposure control, adjustment of the mA and/or kV according to patient size and/or use of iterative reconstruction technique. COMPARISON:  MRI lumbar spine 10/03/2021 FINDINGS: CT THORACIC SPINE FINDINGS Alignment: Thoracic kyphosis is maintained.  No listhesis. Vertebrae: Irregularity of the T4 superior endplate with approximately 20% height loss concerning for compression fracture. Additional subtle irregularity of the T2 and T3 superior endplates without significant height loss. No retropulsion. Additional fracture through the superior aspect of the T12 vertebral body  with subtle height loss anteriorly. No suspicious osseous lesion. Paraspinal and other soft tissues: The paraspinal musculature is unremarkable. Bilateral pleural effusions, right greater than left with associated atelectasis. Atherosclerosis of the thoracic aorta. Partially visualized pacer leads. Disc levels: Intervertebral disc spaces are maintained. Vacuum disc phenomenon at T9-10 and T10-11. No CT evidence of large disc herniation. Small disc bulges and posterior osteophytes noted at the T9-10 and T10-11 levels. No high-grade spinal canal stenosis. Facet arthrosis at multiple levels. Mild foraminal narrowing bilaterally at T1-2. Additional mild foraminal narrowing on the right at T9-10 and bilaterally at T10-11. CT LUMBAR SPINE FINDINGS Segmentation: 5 lumbar type vertebrae. Alignment: Straightening of the normal lumbar lordosis. Trace retrolisthesis of L2 on L3 and of L5 on S1. Vertebrae: Fracture line involving the right anterior aspect of the L1 vertebral body. Fracture line extends along the superior  endplate. There is approximately 15% height loss of L1 centrally. Chronic appearing irregularity of the L5 inferior endplate. Vertebral body heights otherwise maintained. Paraspinal and other soft tissues: The paraspinal musculature is unremarkable. Mild-to-moderate atherosclerosis of the abdominal aorta and branch vessels. Partially visualized cyst in the right hepatic lobe. Disc levels: Mild disc space narrowing at multiple levels. Vacuum disc phenomenon at L2-3. There is additional moderate disc space narrowing at L4-5 and L5-S1. Small disc bulge at L1-2. Disc bulge and posterior osteophytes at L2-3 along with facet arthrosis resulting in mild spinal canal stenosis. Disc bulge, posterior osteophytes, and moderate facet arthrosis at L3-4 resulting in moderate to severe spinal canal stenosis. Additional disc bulge and facet arthrosis at L4-5 resulting in moderate spinal canal stenosis. Disc bulge at L5-S1  without significant spinal canal stenosis. Foraminal narrowing at multiple levels. There is moderate foraminal narrowing noted the left at L4-5 and bilaterally at L5-S1. IMPRESSION: CT THORACIC SPINE IMPRESSION Compression fracture of T4 with 20% height loss centrally. Additional irregularity of the T2 and T3 superior endplates which may reflect additional compression fractures. Consider MRI for further evaluation. Compression fracture of T12 with subtle height loss anteriorly. No retropulsion. Degenerative changes as above. Bilateral pleural effusions. CT LUMBAR SPINE IMPRESSION Additional compression fracture of L1 with up to 15% height loss centrally. No retropulsion. Degenerative changes of the lumbar spine as above. At least moderate to severe spinal canal stenosis at L3-4. Additional moderate spinal canal stenosis at L4-5. Foraminal narrowing greatest on the left at L4-5 and bilaterally at L5-S1. Electronically Signed   By: Emily Filbert M.D.   On: 06/14/2023 13:31   CT Head Wo Contrast Result Date: 06/14/2023 CLINICAL DATA:  Head trauma EXAM: CT HEAD WITHOUT CONTRAST TECHNIQUE: Contiguous axial images were obtained from the base of the skull through the vertex without intravenous contrast. RADIATION DOSE REDUCTION: This exam was performed according to the departmental dose-optimization program which includes automated exposure control, adjustment of the mA and/or kV according to patient size and/or use of iterative reconstruction technique. COMPARISON:  MRI head 10/03/2021, CT head 10/01/2021. FINDINGS: Brain: No acute intracranial hemorrhage. No CT evidence of acute infarct. Nonspecific hypoattenuation in the periventricular and subcortical white matter favored to reflect chronic microvascular ischemic changes. No edema, mass effect, or midline shift. The basilar cisterns are patent. Ventricles: Prominence of the ventricles suggesting underlying parenchymal volume loss. Vascular: Atherosclerotic  calcifications of the carotid siphons. No hyperdense vessel. Skull: No acute or aggressive finding. Orbits: Orbits are symmetric. Sinuses: Near complete opacification of the left sphenoid sinus. Additional mucosal thickening in the ethmoid sinuses. Other: Similar appearance of soft tissue mass in the occipital scalp. Mastoid air cells are clear. IMPRESSION: No CT evidence of acute intracranial abnormality. Chronic microvascular ischemic changes and age related parenchymal volume loss. Paranasal sinus disease as above with near complete opacification of left sphenoid sinus, increased from prior. Similar appearance of mass involving the subcutaneous tissues of the occipital scalp. Electronically Signed   By: Emily Filbert M.D.   On: 06/14/2023 13:07    Procedures Procedures    Medications Ordered in ED Medications - No data to display  ED Course/ Medical Decision Making/ A&P                                 Medical Decision Making Amount and/or Complexity of Data Reviewed Radiology: ordered.  Risk Prescription drug management.   Patient is a 89 year old  who presents after a mechanical fall on March 16.  He has ongoing back pain.  This morning it was a bit worse and the family could not get him out of bed.  He has been using Tylenol with codeine but only maybe 1 dose at night.  He does not have any neurologic deficits.  He has been at his baseline mental status per family.  Given that he had a fall and is on Eliquis, head CT was performed which did not show any intracranial hemorrhage.  I reviewed the x-ray reports from his urgent care visit.  CT scan was performed of his thoracic and lumbosacral spine.  There is multiple compression fractures.  No unstable appearing fractures.  He is neurologically intact.  He has good motor function in his lower extremities.  He was fitted for a TLSO brace.  He was able to ambulate in the ED without significant discomfort or difficulty.  He does not have a fever,  mental status changes, or other symptoms that we more concerning for metabolic abnormalities or infections.  Findings were discussed with the patient's family who was at bedside.  I will go ahead and order home health services as they are concerned about his ability to effectively stay at home.  At this point, he is ambulating at his baseline with a walker and does not seem to need higher level of care.  There is no indication for hospitalization.  Will consult TOC for home health services.  Patient was discharged home in good condition.  Will give him a prescription for Vicodin in case the Tylenol with codeine is not effective.  Did discuss not using together and not using Tylenol in addition to these products.  They have an orthopedist appointment in 2 days.  Return precautions were given.  Final Clinical Impression(s) / ED Diagnoses Final diagnoses:  Compression fracture of body of thoracic vertebra (HCC)  Compression fracture of L1 vertebra, initial encounter (HCC)    Rx / DC Orders ED Discharge Orders          Ordered    HYDROcodone-acetaminophen (NORCO/VICODIN) 5-325 MG tablet  Every 4 hours PRN        06/14/23 1609              Rolan Bucco, MD 06/14/23 1619

## 2023-06-14 NOTE — Progress Notes (Signed)
 Orthopedic Tech Progress Note Patient Details:  Daniel Everett 1931-04-23 [id number]  Ortho Devices Type of Ortho Device: Thoracolumbar corset (TLSO) Ortho Device/Splint Interventions: Ordered, Application, Adjustment   Post Interventions Patient Tolerated: Well Instructions Provided: Adjustment of device  Tonye Pearson 06/14/2023, 3:37 PM

## 2023-06-15 ENCOUNTER — Other Ambulatory Visit: Payer: Self-pay | Admitting: Cardiology

## 2023-06-16 DIAGNOSIS — S32000A Wedge compression fracture of unspecified lumbar vertebra, initial encounter for closed fracture: Secondary | ICD-10-CM | POA: Diagnosis not present

## 2023-06-16 DIAGNOSIS — S22000A Wedge compression fracture of unspecified thoracic vertebra, initial encounter for closed fracture: Secondary | ICD-10-CM | POA: Diagnosis not present

## 2023-06-17 ENCOUNTER — Ambulatory Visit (INDEPENDENT_AMBULATORY_CARE_PROVIDER_SITE_OTHER): Payer: Medicare HMO

## 2023-06-17 DIAGNOSIS — I4821 Permanent atrial fibrillation: Secondary | ICD-10-CM

## 2023-06-17 LAB — CUP PACEART REMOTE DEVICE CHECK
Battery Remaining Longevity: 88 mo
Battery Remaining Percentage: 67 %
Battery Voltage: 3.01 V
Brady Statistic RV Percent Paced: 95 %
Date Time Interrogation Session: 20250325020014
Implantable Lead Connection Status: 753985
Implantable Lead Implant Date: 20200528
Implantable Lead Location: 753860
Implantable Pulse Generator Implant Date: 20200528
Lead Channel Impedance Value: 480 Ohm
Lead Channel Pacing Threshold Amplitude: 0.75 V
Lead Channel Pacing Threshold Pulse Width: 0.5 ms
Lead Channel Sensing Intrinsic Amplitude: 9.4 mV
Lead Channel Setting Pacing Amplitude: 1 V
Lead Channel Setting Pacing Pulse Width: 0.5 ms
Lead Channel Setting Sensing Sensitivity: 2 mV
Pulse Gen Model: 1272
Pulse Gen Serial Number: 9116714

## 2023-06-20 ENCOUNTER — Telehealth: Payer: Self-pay

## 2023-06-20 NOTE — Telephone Encounter (Signed)
 Copied from CRM [id number]. Topic: General - Other >> Jun 20, 2023  3:43 PM Eunice Blase wrote: Reason for CRM: [facility] per Jasmine December [phone] called to inform us of change in pt needing follow up with PCP please call pt's daughter, Dois Davenport [phone].

## 2023-06-20 NOTE — Telephone Encounter (Signed)
 Left detailed message on Daniel Everett's vm informing her to contact the office to schedule an appointment

## 2023-06-25 ENCOUNTER — Telehealth: Payer: Self-pay

## 2023-06-25 NOTE — Telephone Encounter (Signed)
 Copied from CRM [id number]. Topic: Clinical - Home Health Verbal Orders >> Jun 25, 2023  4:42 PM [place] J wrote: Caller/Agency: [facility] Callback Number: [phone] Secured voice mail box. Service Requested: Physical Therapy Frequency: Twice a week 1 week ; Once a week 4 weeks : every other week for 2 weeks Any new concerns about the patient? No

## 2023-06-26 NOTE — Telephone Encounter (Signed)
 Daniel Everett informed of OK

## 2023-07-04 ENCOUNTER — Telehealth: Payer: Self-pay

## 2023-07-04 NOTE — Telephone Encounter (Signed)
 Copied from CRM [id number]. Topic: Clinical - Home Health Verbal Orders >> Jul 04, 2023 11:12 AM Drema Balzarine wrote: Caller/Agency: Iona Coach from [facility]  Callback Number: [phone] Service Requested: Occupational Therapy Frequency: 1x week 4 weeks, skip a week, 1x week for 2 weeks Any new concerns about the patient? No

## 2023-07-04 NOTE — Telephone Encounter (Signed)
 Left message for Iona Coach to return my call

## 2023-07-09 NOTE — Telephone Encounter (Signed)
 Left message for Daniel Everett to return my call

## 2023-07-14 ENCOUNTER — Telehealth: Payer: Self-pay

## 2023-07-14 NOTE — Telephone Encounter (Signed)
 Copied from CRM [id number]. Topic: Clinical - Home Health Verbal Orders >> Jul 14, 2023  4:49 PM Alyse July wrote: Caller/Agency: [facility]) Callback Number: [phone] message can be left at this number Service Requested: Physical Therapy(Continuation of) Frequency: Once a week for 4 week Any new concerns about the patient? No

## 2023-07-14 NOTE — Telephone Encounter (Signed)
 Left message for Daniel Everett to return my call

## 2023-07-16 NOTE — Telephone Encounter (Signed)
 Ardis Becton informed of message below

## 2023-07-31 NOTE — Progress Notes (Signed)
 Cardiology Office Note   Date:  08/04/2023   ID:  Daniel Everett, DOB [birth year], MRN [id number]  PCP:  Marquetta Sit, MD  Cardiologist:  Dr Everett  Taina Landry [place], MD 03/27/2016   History of Present Illness: Daniel Everett is a 89 y.o. male with a history of hypothyroid, hyperglycemia, prostate CA s/p TURP, OA, colon polyps, HTN, RBBB- seen for follow up of Atrial fibrillation.  He was initially seen by Armandina Bernard PA-C in January 2018 for atrial fibrillation with  HR 50s at baseline, on Xarelto . On no AV nodal blocking agents. Holter ordered. BP up, HCTZ added. BMET 1 week later was ok. Holter w/ no pauses > 3.2 sec, HR generally slow, 52 avg, lowest 21 at 5 am. Echo showed normal EF. Mild MR, mod TR. Severe biatrial enlargement.   He has been seen by Dr. Tilda Fogo in January for evaluation of tremor, gait instability.  EMG c/w peripheral neuropathy. Other lab studies normal. Was noted to have marked bradycardia with Afib and he subsequently had a [employer] single-chamber pacemaker implanted 08/20/2018. Followed by Dr Lawana Pray. Last check in September 2021 was satisfactory.   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   Previously  we stopped his amlodipine  and HCTZ due to very low BP.   He was admitted in July 2023 with  confusion and LE edema. Echo showed normal LV and valve function. Some RV enlargement. Was started on lasix  with resolution of his edema.   On follow up today he denies any SOB, edema, chest pain, dizziness, falls. He does a little walking.      Past Medical History:  Diagnosis Date   Acute encephalopathy 10/01/2021   Acute on chronic diastolic CHF (congestive heart failure)    Allergic rhinitis due to pollen 06/07/2008   Anticoagulated by anticoagulation treatment 01/08/2019   Chronic maxillary sinusitis 08/24/2008   Cortical senile cataract 05/03/2020   Dementia due to late onset Alzheimer's disease 10/31/2021   Dermatophytosis of nail  06/06/2009   Diverticulosis of colon 11/27/2006   Essential hypertension 10/28/2006   Essential tremor 06/25/2016   Gait abnormality 03/02/2018   Generalized osteoarthrosis, involving multiple sites 01/03/2010   GERD (gastroesophageal reflux disease) 02/08/2015   History of colonic polyps 10/28/2006   Hoarseness, chronic 10/18/2008   Hydrocele    S/P repair 08/2001 complicated by seroma   Hyperglycemia 10/18/2008   Hyperlipidemia 10/28/2006   Hypothyroidism 11/27/2006   Leg swelling 10/02/2021   Melanoma of left side of neck 01/29/2019   Nocturia 02/08/2015   Open-angle glaucoma 05/03/2020   Peripheral neuropathy 04/08/2018   Permanent atrial fibrillation 08/20/2018   Personal history of malignant neoplasm of prostate 05/28/2007   Presence of permanent cardiac pacemaker 08/20/2018   Senile nuclear sclerosis 05/03/2020    Past Surgical History:  Procedure Laterality Date   arthroscopy rt knee     EXCISION MASS NECK Left 01/29/2019   Procedure: 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;  Surgeon: Janita Mellow, MD;  Location: [facility] OR;  Service: ENT;  Laterality: Left;   HYDROCELE EXCISION / REPAIR     INSERT / REPLACE / REMOVE PACEMAKER  08/20/2018   PACEMAKER IMPLANT N/A 08/20/2018   Procedure: PACEMAKER IMPLANT;  Surgeon: Lei Pump, MD;  Location: [facility];  Service: Cardiovascular;  Laterality: N/A;   PROSTATE SURGERY     TURP   ROTATOR CUFF REPAIR      Current Outpatient  Medications  Medication Sig Dispense Refill   acetaminophen  (TYLENOL ) 650 MG CR tablet Take 650 mg by mouth 2 (two) times a day.     apixaban  (ELIQUIS ) 5 MG TABS tablet Take 1 tablet (5 mg total) by mouth 2 (two) times daily. 180 tablet 3   atorvastatin  (LIPITOR) 20 MG tablet Take 1 tablet (20 mg total) by mouth daily. NEED OV. 90 tablet 3   Cholecalciferol  (VITAMIN D3) 50 MCG (2000 UT) TABS Take 2,000 Units by mouth daily.     finasteride  (PROSCAR ) 5 MG tablet Take 1  tablet (5 mg total) by mouth daily. 90 tablet 1   furosemide  (LASIX ) 20 MG tablet TAKE 1 TABLET DAILY 90 tablet 1   HYDROcodone -acetaminophen  (NORCO/VICODIN) 5-325 MG tablet Take 1 tablet by mouth every 4 (four) hours as needed. 12 tablet 0   levothyroxine  (SYNTHROID ) 75 MCG tablet TAKE 1 TABLET DAILY 90 tablet 1   lisinopril  (ZESTRIL ) 20 MG tablet TAKE 1 TABLET DAILY 90 tablet 3   memantine  (NAMENDA ) 10 MG tablet TAKE 1 TABLET TWICE A DAY 180 tablet 1   omeprazole  (PRILOSEC) 20 MG capsule TAKE 1 CAPSULE DAILY 90 capsule 0   pyridOXINE  (VITAMIN B-6) 100 MG tablet Take 100 mg by mouth daily.     timolol  (TIMOPTIC ) 0.5 % ophthalmic solution Place 1 drop into the left eye 2 (two) times daily.     No current facility-administered medications for this visit.    Allergies:   Patient has no known allergies.    Social History:  The patient  reports that he has never smoked. He has never used smokeless tobacco. He reports that he does not drink alcohol and does not use drugs.   Family History:  The patient's family history includes Cancer in his father; Colon cancer in his sister; Coronary artery disease in his mother; Heart disease in his mother; Pancreatic cancer in his father; Tremor in his sister.    ROS:  Please see the history of present illness. All other systems are reviewed and negative.    PHYSICAL EXAM: VS:  BP (!) 149/92   Pulse 62   Ht 6\' 5"  (1.956 m)   Wt 190 lb 9.6 oz (86.5 kg)   SpO2 95%   BMI 22.60 kg/m  , BMI Body mass index is 22.6 kg/m. GENERAL:  Well appearing, elderly WM in NAD HEENT:  PERRL, EOMI, sclera are clear. Oropharynx is clear. NECK:  No jugular venous distention, carotid upstroke brisk and symmetric, no bruits, no thyromegaly or adenopathy LUNGS:  Clear to auscultation bilaterally CHEST:  Unremarkable HEART:  RRR,    PMI not displaced or sustained,S1 and S2 within normal limits, no S3, no S4: no clicks, no rubs, no murmurs ABD:  Soft, nontender. BS +, no  masses or bruits. No hepatomegaly, no splenomegaly EXT:  2 + pulses throughout, no edema, no cyanosis no clubbing SKIN:  Warm and dry.  No rashes NEURO:  Alert and oriented x 3. Cranial nerves II through XII intact. PSYCH:  Cognitively intact   Recent Labs: 05/21/2023: ALT 15; BUN 27; Creatinine, Ser 1.28; Potassium 3.9; Sodium 145    Lipid Panel    Component Value Date/Time   CHOL 140 05/21/2023 1417   TRIG 133.0 05/21/2023 1417   HDL 38.10 (L) 05/21/2023 1417   CHOLHDL 4 05/21/2023 1417   VLDL 26.6 05/21/2023 1417   LDLCALC 75 05/21/2023 1417   LDLDIRECT 93.0 05/15/2022 1358     Wt Readings from Last 3 Encounters:  08/04/23 190 lb 9.6 oz (86.5 kg)  06/14/23 188 lb (85.3 kg)  05/21/23 188 lb 11.2 oz (85.6 kg)     Other studies Reviewed: Additional studies/ records that were reviewed today include: office notes and testing.  EKG Interpretation Date/Time:  Monday Aug 04 2023 15:46:16 EDT Ventricular Rate:  60 PR Interval:    QRS Duration:  188 QT Interval:  488 QTC Calculation: 488 R Axis:   -79  Text Interpretation: Ventricular-paced rhythm When compared with ECG of Jul 03, 2022 No significant change since last tracing Confirmed by Everett, Daniel Levenhagen [phone]) on 08/04/2023 3:48:23 PM   Holter 04/04/16: Study Highlights   Atrial fibrillation with slow ventricular response Rare PVCs and PVC couplets Longest pause 3.2 seconds    Echo 04/08/16: Study Conclusions   - Left ventricle: The cavity size was normal. Wall thickness was   normal. Systolic function was normal. The estimated ejection   fraction was in the range of 55% to 60%. Wall motion was normal;   there were no regional wall motion abnormalities. The study is   not technically sufficient to allow evaluation of LV diastolic   function. - Aortic valve: There was trivial regurgitation. - Mitral valve: There was mild regurgitation. - Left atrium: The atrium was severely dilated. - Right atrium: The atrium was  severely dilated. - Atrial septum: No defect or patent foramen ovale was identified. - Tricuspid valve: There was moderate regurgitation.  Echo 10/03/21: IMPRESSIONS     1. Left ventricular ejection fraction, by estimation, is 60 to 65%. The  left ventricle has normal function. The left ventricle has no regional  wall motion abnormalities. Left ventricular diastolic function could not  be evaluated.   2. Right ventricular systolic function is normal. The right ventricular  size is moderately enlarged. There is mildly elevated pulmonary artery  systolic pressure. The estimated right ventricular systolic pressure is  38.2 mmHg.   3. Left atrial size was severely dilated.   4. Right atrial size was severely dilated.   5. The mitral valve is normal in structure. Trivial mitral valve  regurgitation. No evidence of mitral stenosis.   6. The aortic valve is tricuspid. Aortic valve regurgitation is mild.  Aortic valve sclerosis/calcification is present, without any evidence of  aortic stenosis. Aortic regurgitation PHT measures 699 msec. Aortic valve  area, by VTI measures 2.87 cm.  Aortic valve mean gradient measures 3.0 mmHg. Aortic valve Vmax measures  1.18 m/s.   7. Aortic dilatation noted. There is mild dilatation of the aortic root,  measuring 42 mm.   8. The inferior vena cava is dilated in size with <50% respiratory  variability, suggesting right atrial pressure of 15 mmHg.   ASSESSMENT AND PLAN:  1.  Atrial fib-  chronic with slow ventricular response.  S/p PPM placement in May 2020. Recent pacer check on March 26 was normal. Clinically doing well. Continue Eliquis . On no rate slowing meds.   2. Chronic anticoag: CHA2DS2VASc=3 (age x 2, HTN). Continue Eliquis .   3. HTN- BP is adequate especially with hypotension noted in past  4. Dementia  5.  Edema. Resolved with addition of lasix - continue   Current medicines are reviewed at length with the patient today.  The patient  has concerns regarding medicines. Concerns were addressed.  The following changes have been made:  no change  Labs/ tests ordered today include:   Orders Placed This Encounter  Procedures   EKG 12-Lead   Follow up in one year  Signed, Jahvon Gosline [place], MD  08/04/2023 3:57 PM    [facility]

## 2023-08-01 NOTE — Progress Notes (Signed)
 Remote pacemaker transmission.

## 2023-08-04 ENCOUNTER — Ambulatory Visit: Payer: Medicare HMO | Attending: Cardiology | Admitting: Cardiology

## 2023-08-04 ENCOUNTER — Encounter: Payer: Self-pay | Admitting: Cardiology

## 2023-08-04 VITALS — BP 149/92 | HR 62 | Ht 77.0 in | Wt 190.6 lb

## 2023-08-04 DIAGNOSIS — I4891 Unspecified atrial fibrillation: Secondary | ICD-10-CM | POA: Diagnosis not present

## 2023-08-04 DIAGNOSIS — Z950 Presence of cardiac pacemaker: Secondary | ICD-10-CM

## 2023-08-04 DIAGNOSIS — I4821 Permanent atrial fibrillation: Secondary | ICD-10-CM

## 2023-08-04 DIAGNOSIS — I5033 Acute on chronic diastolic (congestive) heart failure: Secondary | ICD-10-CM | POA: Diagnosis not present

## 2023-08-04 DIAGNOSIS — I10 Essential (primary) hypertension: Secondary | ICD-10-CM | POA: Diagnosis not present

## 2023-08-04 NOTE — Patient Instructions (Signed)

## 2023-08-13 ENCOUNTER — Other Ambulatory Visit: Payer: Self-pay

## 2023-08-13 ENCOUNTER — Other Ambulatory Visit: Payer: Self-pay | Admitting: Family Medicine

## 2023-08-13 MED ORDER — ATORVASTATIN CALCIUM 20 MG PO TABS: 20.0000 mg | ORAL_TABLET | Freq: Every day | ORAL | 3 refills | Status: AC

## 2023-09-01 DIAGNOSIS — D0471 Carcinoma in situ of skin of right lower limb, including hip: Secondary | ICD-10-CM | POA: Diagnosis not present

## 2023-09-01 DIAGNOSIS — Z08 Encounter for follow-up examination after completed treatment for malignant neoplasm: Secondary | ICD-10-CM | POA: Diagnosis not present

## 2023-09-01 DIAGNOSIS — L821 Other seborrheic keratosis: Secondary | ICD-10-CM | POA: Diagnosis not present

## 2023-09-01 DIAGNOSIS — D492 Neoplasm of unspecified behavior of bone, soft tissue, and skin: Secondary | ICD-10-CM | POA: Diagnosis not present

## 2023-09-01 DIAGNOSIS — D225 Melanocytic nevi of trunk: Secondary | ICD-10-CM | POA: Diagnosis not present

## 2023-09-01 DIAGNOSIS — C44612 Basal cell carcinoma of skin of right upper limb, including shoulder: Secondary | ICD-10-CM | POA: Diagnosis not present

## 2023-09-01 DIAGNOSIS — L7211 Pilar cyst: Secondary | ICD-10-CM | POA: Diagnosis not present

## 2023-09-01 DIAGNOSIS — L578 Other skin changes due to chronic exposure to nonionizing radiation: Secondary | ICD-10-CM | POA: Diagnosis not present

## 2023-09-01 DIAGNOSIS — Z85820 Personal history of malignant melanoma of skin: Secondary | ICD-10-CM | POA: Diagnosis not present

## 2023-09-01 DIAGNOSIS — L814 Other melanin hyperpigmentation: Secondary | ICD-10-CM | POA: Diagnosis not present

## 2023-09-01 DIAGNOSIS — L570 Actinic keratosis: Secondary | ICD-10-CM | POA: Diagnosis not present

## 2023-09-02 ENCOUNTER — Other Ambulatory Visit: Payer: Self-pay | Admitting: *Deleted

## 2023-09-02 MED ORDER — LISINOPRIL 20 MG PO TABS: 20.0000 mg | ORAL_TABLET | Freq: Every day | ORAL | 3 refills | Status: AC

## 2023-09-02 MED ORDER — OMEPRAZOLE 20 MG PO CPDR: 20.0000 mg | DELAYED_RELEASE_CAPSULE | Freq: Every day | ORAL | 3 refills | Status: AC

## 2023-09-16 ENCOUNTER — Ambulatory Visit: Payer: Medicare HMO

## 2023-09-16 DIAGNOSIS — I4821 Permanent atrial fibrillation: Secondary | ICD-10-CM

## 2023-09-16 LAB — CUP PACEART REMOTE DEVICE CHECK
Battery Remaining Longevity: 88 mo
Battery Remaining Percentage: 65 %
Battery Voltage: 3.01 V
Brady Statistic RV Percent Paced: 95 %
Date Time Interrogation Session: 20250624020015
Implantable Lead Connection Status: 753985
Implantable Lead Implant Date: 20200528
Implantable Lead Location: 753860
Implantable Pulse Generator Implant Date: 20200528
Lead Channel Impedance Value: 450 Ohm
Lead Channel Pacing Threshold Amplitude: 0.75 V
Lead Channel Pacing Threshold Pulse Width: 0.5 ms
Lead Channel Sensing Intrinsic Amplitude: 12 mV
Lead Channel Setting Pacing Amplitude: 1 V
Lead Channel Setting Pacing Pulse Width: 0.5 ms
Lead Channel Setting Sensing Sensitivity: 2 mV
Pulse Gen Model: 1272
Pulse Gen Serial Number: 9116714

## 2023-09-18 ENCOUNTER — Ambulatory Visit: Payer: Self-pay | Admitting: Cardiology

## 2023-10-02 DIAGNOSIS — E039 Hypothyroidism, unspecified: Secondary | ICD-10-CM | POA: Diagnosis not present

## 2023-10-02 DIAGNOSIS — I110 Hypertensive heart disease with heart failure: Secondary | ICD-10-CM | POA: Diagnosis not present

## 2023-10-02 DIAGNOSIS — G309 Alzheimer's disease, unspecified: Secondary | ICD-10-CM | POA: Diagnosis not present

## 2023-10-02 DIAGNOSIS — I4891 Unspecified atrial fibrillation: Secondary | ICD-10-CM | POA: Diagnosis not present

## 2023-10-02 DIAGNOSIS — R32 Unspecified urinary incontinence: Secondary | ICD-10-CM | POA: Diagnosis not present

## 2023-10-02 DIAGNOSIS — I509 Heart failure, unspecified: Secondary | ICD-10-CM | POA: Diagnosis not present

## 2023-10-02 DIAGNOSIS — Z008 Encounter for other general examination: Secondary | ICD-10-CM | POA: Diagnosis not present

## 2023-10-02 DIAGNOSIS — Z8249 Family history of ischemic heart disease and other diseases of the circulatory system: Secondary | ICD-10-CM | POA: Diagnosis not present

## 2023-10-02 DIAGNOSIS — M1990 Unspecified osteoarthritis, unspecified site: Secondary | ICD-10-CM | POA: Diagnosis not present

## 2023-10-02 DIAGNOSIS — K219 Gastro-esophageal reflux disease without esophagitis: Secondary | ICD-10-CM | POA: Diagnosis not present

## 2023-10-02 DIAGNOSIS — D6869 Other thrombophilia: Secondary | ICD-10-CM | POA: Diagnosis not present

## 2023-10-02 DIAGNOSIS — I700 Atherosclerosis of aorta: Secondary | ICD-10-CM | POA: Diagnosis not present

## 2023-10-02 DIAGNOSIS — I2510 Atherosclerotic heart disease of native coronary artery without angina pectoris: Secondary | ICD-10-CM | POA: Diagnosis not present

## 2023-10-14 ENCOUNTER — Other Ambulatory Visit: Payer: Self-pay | Admitting: Family Medicine

## 2023-10-29 DIAGNOSIS — L905 Scar conditions and fibrosis of skin: Secondary | ICD-10-CM | POA: Diagnosis not present

## 2023-10-29 DIAGNOSIS — C44612 Basal cell carcinoma of skin of right upper limb, including shoulder: Secondary | ICD-10-CM | POA: Diagnosis not present

## 2023-10-29 DIAGNOSIS — D0471 Carcinoma in situ of skin of right lower limb, including hip: Secondary | ICD-10-CM | POA: Diagnosis not present

## 2023-11-04 ENCOUNTER — Other Ambulatory Visit: Payer: Self-pay | Admitting: Family Medicine

## 2023-11-19 ENCOUNTER — Ambulatory Visit: Payer: Medicare HMO | Admitting: Family Medicine

## 2023-11-19 ENCOUNTER — Encounter: Payer: Self-pay | Admitting: Family Medicine

## 2023-11-19 VITALS — BP 118/70 | HR 59 | Wt 178.5 lb

## 2023-11-19 DIAGNOSIS — G309 Alzheimer's disease, unspecified: Secondary | ICD-10-CM

## 2023-11-19 DIAGNOSIS — E038 Other specified hypothyroidism: Secondary | ICD-10-CM

## 2023-11-19 DIAGNOSIS — E7800 Pure hypercholesterolemia, unspecified: Secondary | ICD-10-CM

## 2023-11-19 DIAGNOSIS — F0280 Dementia in other diseases classified elsewhere without behavioral disturbance: Secondary | ICD-10-CM | POA: Diagnosis not present

## 2023-11-19 DIAGNOSIS — I4821 Permanent atrial fibrillation: Secondary | ICD-10-CM | POA: Diagnosis not present

## 2023-11-19 DIAGNOSIS — R634 Abnormal weight loss: Secondary | ICD-10-CM

## 2023-11-19 DIAGNOSIS — I10 Essential (primary) hypertension: Secondary | ICD-10-CM

## 2023-11-19 NOTE — Patient Instructions (Addendum)
 Consider nutrition supplement such as Boost or Ensure  Get Flu vaccine later this Fall.

## 2023-11-19 NOTE — Progress Notes (Signed)
 Established Patient Office Visit  Subjective   Patient ID: Daniel Everett, male    DOB: 07/21/1931  Age: 89 y.o. MRN: [id number]  Chief Complaint  Patient presents with   Medical Management of Chronic Issues    HPI   Mr. Pulse is seen today companied by daughter.  Multiple chronic problems including history of diastolic heart failure, hypertension, atrial fibrillation, dementia, GERD, hypothyroidism, essential tremor, peripheral neuropathy, hyperlipidemia, history of melanoma.  Followed regularly by dermatology and reportedly had 2 skin cancers removed recently.  Very supportive family.  He has good 24/7 coverage.  He ambulates with a walker.  No recent falls.  Has had some weight loss about 10 pounds since last visit.  He states he is eating 3 meals per day but apparently does not clear his plate frequently and also has given up things like desserts and in between snacks.  He does go to the [facility] every year for Eliquis  prescription and was seen there last fall.  Last TSH was at the [facility] in November.  They plan to go back this year.  Most recent creatinine 1.28.  Medications reviewed.  Current medications include levothyroxine , Namenda , lisinopril , omeprazole , atorvastatin , finasteride , furosemide , and Eliquis  5 mg twice daily.  Past Medical History:  Diagnosis Date   Acute encephalopathy 10/01/2021   Acute on chronic diastolic CHF (congestive heart failure)    Allergic rhinitis due to pollen 06/07/2008   Anticoagulated by anticoagulation treatment 01/08/2019   Chronic maxillary sinusitis 08/24/2008   Cortical senile cataract 05/03/2020   Dementia due to late onset Alzheimer's disease 10/31/2021   Dermatophytosis of nail 06/06/2009   Diverticulosis of colon 11/27/2006   Essential hypertension 10/28/2006   Essential tremor 06/25/2016   Gait abnormality 03/02/2018   Generalized osteoarthrosis, involving multiple sites 01/03/2010   GERD (gastroesophageal reflux disease) 02/08/2015    History of colonic polyps 10/28/2006   Hoarseness, chronic 10/18/2008   Hydrocele    S/P repair 08/2001 complicated by seroma   Hyperglycemia 10/18/2008   Hyperlipidemia 10/28/2006   Hypothyroidism 11/27/2006   Leg swelling 10/02/2021   Melanoma of left side of neck 01/29/2019   Nocturia 02/08/2015   Open-angle glaucoma 05/03/2020   Peripheral neuropathy 04/08/2018   Permanent atrial fibrillation 08/20/2018   Personal history of malignant neoplasm of prostate 05/28/2007   Presence of permanent cardiac pacemaker 08/20/2018   Senile nuclear sclerosis 05/03/2020   Past Surgical History:  Procedure Laterality Date   arthroscopy rt knee     EXCISION MASS NECK Left 01/29/2019   Procedure: 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;  Surgeon: Jesus Oliphant, MD;  Location: [facility] OR;  Service: ENT;  Laterality: Left;   HYDROCELE EXCISION / REPAIR     INSERT / REPLACE / REMOVE PACEMAKER  08/20/2018   PACEMAKER IMPLANT N/A 08/20/2018   Procedure: PACEMAKER IMPLANT;  Surgeon: Inocencio Soyla Lunger, MD;  Location: [facility];  Service: Cardiovascular;  Laterality: N/A;   PROSTATE SURGERY     TURP   ROTATOR CUFF REPAIR      reports that he has never smoked. He has never used smokeless tobacco. He reports that he does not drink alcohol and does not use drugs. family history includes Cancer in his father; Colon cancer in his sister; Coronary artery disease in his mother; Heart disease in his mother; Pancreatic cancer in his father; Tremor in his sister. No Known Allergies  Review of Systems  Constitutional:  Negative for chills, fever and malaise/fatigue.  Eyes:  Negative for blurred vision.  Respiratory:  Negative for shortness of breath.   Cardiovascular:  Negative for chest pain.  Neurological:  Negative for dizziness, weakness and headaches.      Objective:     BP 118/70 (BP Location: Left Arm, Cuff Size: Normal)   Pulse (!) 59   Wt 178 lb 8 oz (81 kg)   SpO2 98%    BMI 21.17 kg/m  BP Readings from Last 3 Encounters:  11/19/23 118/70  08/04/23 (!) 149/92  06/14/23 (!) 174/107   Wt Readings from Last 3 Encounters:  11/19/23 178 lb 8 oz (81 kg)  08/04/23 190 lb 9.6 oz (86.5 kg)  06/14/23 188 lb (85.3 kg)      Physical Exam Vitals reviewed.  Constitutional:      Appearance: He is well-developed.  Eyes:     Pupils: Pupils are equal, round, and reactive to light.  Neck:     Thyroid : No thyromegaly.  Cardiovascular:     Rate and Rhythm: Normal rate and regular rhythm.     Comments: Appears to be in regular rhythm at this time but does have history of chronic atrial fibrillation Pulmonary:     Effort: Pulmonary effort is normal. No respiratory distress.     Breath sounds: Normal breath sounds. No wheezing or rales.  Musculoskeletal:     Cervical back: Neck supple.     Right lower leg: No edema.     Left lower leg: No edema.  Neurological:     Mental Status: He is alert and oriented to person, place, and time.      No results found for any visits on 11/19/23.    The ASCVD Risk score (Arnett DK, et al., 2019) failed to calculate for the following reasons:   The 2019 ASCVD risk score is only valid for ages 29 to 42    Assessment & Plan:   #1 late onset Alzheimer's dementia.  No behavioral issues.  Remains on Namenda .  Very supportive family and has excellent 24/7 help.  They have no specific concerns  #2 hypertension.  Initial blood pressure up but came down significantly with rest.  Continue lisinopril   #3 hyperlipidemia treated with atorvastatin  20 mg daily.  Lipids were checked last winter and stable.  No side effects from medication.  Continue current dosage of medication  #4 weight loss.  He is lost about 10 pounds since the winter.  Daughter states he eats less volume at meals and has given up things like desserts.  We did suggest they consider possible supplements such as Ensure or boost.  #5 history of chronic atrial  fibrillation.  Patient on Eliquis  5 mg twice daily.  Most recent creatinine 1.28.  Weight over 60 kg.  Routine follow-up in 6 months and sooner as needed  Return in about 6 months (around 05/21/2024).    Wolm Scarlet, MD

## 2023-11-22 ENCOUNTER — Other Ambulatory Visit: Payer: Self-pay | Admitting: Family Medicine

## 2023-12-12 DIAGNOSIS — H401132 Primary open-angle glaucoma, bilateral, moderate stage: Secondary | ICD-10-CM | POA: Diagnosis not present

## 2023-12-16 ENCOUNTER — Ambulatory Visit (INDEPENDENT_AMBULATORY_CARE_PROVIDER_SITE_OTHER): Payer: Medicare HMO

## 2023-12-16 DIAGNOSIS — I4821 Permanent atrial fibrillation: Secondary | ICD-10-CM

## 2023-12-16 LAB — CUP PACEART REMOTE DEVICE CHECK
Battery Remaining Longevity: 86 mo
Battery Remaining Percentage: 63 %
Battery Voltage: 3.01 V
Brady Statistic RV Percent Paced: 94 %
Date Time Interrogation Session: 20250923020014
Implantable Lead Connection Status: 753985
Implantable Lead Implant Date: 20200528
Implantable Lead Location: 753860
Implantable Pulse Generator Implant Date: 20200528
Lead Channel Impedance Value: 450 Ohm
Lead Channel Pacing Threshold Amplitude: 0.75 V
Lead Channel Pacing Threshold Pulse Width: 0.5 ms
Lead Channel Sensing Intrinsic Amplitude: 9.6 mV
Lead Channel Setting Pacing Amplitude: 1 V
Lead Channel Setting Pacing Pulse Width: 0.5 ms
Lead Channel Setting Sensing Sensitivity: 2 mV
Pulse Gen Model: 1272
Pulse Gen Serial Number: 9116714

## 2023-12-17 ENCOUNTER — Ambulatory Visit: Payer: Self-pay | Admitting: Cardiology

## 2023-12-17 NOTE — Progress Notes (Signed)
 Remote PPM Transmission

## 2023-12-22 NOTE — Progress Notes (Signed)
 Remote pacemaker transmission.

## 2024-01-12 ENCOUNTER — Encounter: Payer: Self-pay | Admitting: Family Medicine

## 2024-02-12 ENCOUNTER — Other Ambulatory Visit: Payer: Self-pay | Admitting: Family Medicine

## 2024-03-03 DIAGNOSIS — D225 Melanocytic nevi of trunk: Secondary | ICD-10-CM | POA: Diagnosis not present

## 2024-03-03 DIAGNOSIS — Z08 Encounter for follow-up examination after completed treatment for malignant neoplasm: Secondary | ICD-10-CM | POA: Diagnosis not present

## 2024-03-03 DIAGNOSIS — D492 Neoplasm of unspecified behavior of bone, soft tissue, and skin: Secondary | ICD-10-CM | POA: Diagnosis not present

## 2024-03-03 DIAGNOSIS — Z85828 Personal history of other malignant neoplasm of skin: Secondary | ICD-10-CM | POA: Diagnosis not present

## 2024-03-03 DIAGNOSIS — L821 Other seborrheic keratosis: Secondary | ICD-10-CM | POA: Diagnosis not present

## 2024-03-03 DIAGNOSIS — L814 Other melanin hyperpigmentation: Secondary | ICD-10-CM | POA: Diagnosis not present

## 2024-03-03 DIAGNOSIS — Z85820 Personal history of malignant melanoma of skin: Secondary | ICD-10-CM | POA: Diagnosis not present

## 2024-03-16 ENCOUNTER — Ambulatory Visit: Payer: Medicare HMO

## 2024-03-16 DIAGNOSIS — I4821 Permanent atrial fibrillation: Secondary | ICD-10-CM | POA: Diagnosis not present

## 2024-03-16 LAB — CUP PACEART REMOTE DEVICE CHECK
Battery Remaining Longevity: 85 mo
Battery Remaining Percentage: 61 %
Battery Voltage: 2.99 V
Brady Statistic RV Percent Paced: 94 %
Date Time Interrogation Session: 20251223020012
Implantable Lead Connection Status: 753985
Implantable Lead Implant Date: 20200528
Implantable Lead Location: 753860
Implantable Pulse Generator Implant Date: 20200528
Lead Channel Impedance Value: 460 Ohm
Lead Channel Pacing Threshold Amplitude: 0.625 V
Lead Channel Pacing Threshold Pulse Width: 0.5 ms
Lead Channel Sensing Intrinsic Amplitude: 8.7 mV
Lead Channel Setting Pacing Amplitude: 0.875
Lead Channel Setting Pacing Pulse Width: 0.5 ms
Lead Channel Setting Sensing Sensitivity: 2 mV
Pulse Gen Model: 1272
Pulse Gen Serial Number: 9116714

## 2024-03-17 NOTE — Progress Notes (Signed)
 Remote PPM Transmission

## 2024-03-23 ENCOUNTER — Ambulatory Visit: Payer: Self-pay | Admitting: Cardiology

## 2024-04-04 ENCOUNTER — Other Ambulatory Visit: Payer: Self-pay | Admitting: Family Medicine

## 2024-05-21 ENCOUNTER — Ambulatory Visit: Admitting: Family Medicine
